# Patient Record
Sex: Male | Born: 1945
Health system: Southern US, Community
[De-identification: ages and names within clinical notes are randomized; demographics above are authoritative.]

## PROBLEM LIST (undated history)

## (undated) DIAGNOSIS — K219 Gastro-esophageal reflux disease without esophagitis: Secondary | ICD-10-CM

## (undated) DIAGNOSIS — D649 Anemia, unspecified: Secondary | ICD-10-CM

## (undated) DIAGNOSIS — T7840XA Allergy, unspecified, initial encounter: Secondary | ICD-10-CM

## (undated) DIAGNOSIS — I2510 Atherosclerotic heart disease of native coronary artery without angina pectoris: Secondary | ICD-10-CM

## (undated) DIAGNOSIS — G43109 Migraine with aura, not intractable, without status migrainosus: Secondary | ICD-10-CM

## (undated) DIAGNOSIS — C801 Malignant (primary) neoplasm, unspecified: Secondary | ICD-10-CM

## (undated) DIAGNOSIS — K759 Inflammatory liver disease, unspecified: Secondary | ICD-10-CM

## (undated) DIAGNOSIS — E039 Hypothyroidism, unspecified: Secondary | ICD-10-CM

## (undated) DIAGNOSIS — E119 Type 2 diabetes mellitus without complications: Secondary | ICD-10-CM

## (undated) DIAGNOSIS — I10 Essential (primary) hypertension: Secondary | ICD-10-CM

## (undated) DIAGNOSIS — H269 Unspecified cataract: Secondary | ICD-10-CM

## (undated) DIAGNOSIS — M1990 Unspecified osteoarthritis, unspecified site: Secondary | ICD-10-CM

## (undated) DIAGNOSIS — G4730 Sleep apnea, unspecified: Secondary | ICD-10-CM

## (undated) DIAGNOSIS — E785 Hyperlipidemia, unspecified: Secondary | ICD-10-CM

## (undated) DIAGNOSIS — Z8669 Personal history of other diseases of the nervous system and sense organs: Secondary | ICD-10-CM

## (undated) HISTORY — DX: Gastro-esophageal reflux disease without esophagitis: K21.9

## (undated) HISTORY — PX: BACK SURGERY: SHX140

## (undated) HISTORY — PX: COLONOSCOPY: SHX174

## (undated) HISTORY — DX: Migraine with aura, not intractable, without status migrainosus: G43.109

## (undated) HISTORY — PX: SURGERY SCROTAL / TESTICULAR: SUR1316

## (undated) HISTORY — DX: Hyperlipidemia, unspecified: E78.5

## (undated) HISTORY — DX: Allergy, unspecified, initial encounter: T78.40XA

## (undated) HISTORY — PX: CARDIAC SURGERY: SHX584

## (undated) HISTORY — DX: Personal history of other diseases of the nervous system and sense organs: Z86.69

## (undated) HISTORY — PX: ELBOW SURGERY: SHX618

## (undated) HISTORY — DX: Essential (primary) hypertension: I10

## (undated) HISTORY — PX: KNEE SURGERY: SHX244

## (undated) HISTORY — PX: CORONARY ARTERY BYPASS GRAFT: SHX141

---

## 1999-02-27 ENCOUNTER — Ambulatory Visit: Admission: RE | Admit: 1999-02-27 | Discharge: 1999-02-27 | Payer: Self-pay | Admitting: Pulmonary Disease

## 1999-07-05 HISTORY — PX: CORONARY ANGIOPLASTY: SHX604

## 1999-08-05 ENCOUNTER — Ambulatory Visit: Admission: RE | Admit: 1999-08-05 | Discharge: 1999-08-05 | Payer: Self-pay | Admitting: Pulmonary Disease

## 1999-10-13 ENCOUNTER — Encounter: Payer: Self-pay | Admitting: Cardiology

## 1999-10-13 ENCOUNTER — Encounter: Admission: RE | Admit: 1999-10-13 | Discharge: 1999-10-13 | Payer: Self-pay | Admitting: Cardiology

## 1999-10-18 ENCOUNTER — Ambulatory Visit (HOSPITAL_COMMUNITY): Admission: RE | Admit: 1999-10-18 | Discharge: 1999-10-19 | Payer: Self-pay | Admitting: Cardiology

## 2000-06-11 ENCOUNTER — Emergency Department (HOSPITAL_COMMUNITY): Admission: EM | Admit: 2000-06-11 | Discharge: 2000-06-11 | Payer: Self-pay | Admitting: Emergency Medicine

## 2000-11-30 ENCOUNTER — Encounter: Payer: Self-pay | Admitting: General Surgery

## 2000-11-30 ENCOUNTER — Ambulatory Visit (HOSPITAL_COMMUNITY): Admission: RE | Admit: 2000-11-30 | Discharge: 2000-11-30 | Payer: Self-pay | Admitting: General Surgery

## 2000-12-12 ENCOUNTER — Encounter: Payer: Self-pay | Admitting: Gastroenterology

## 2000-12-12 ENCOUNTER — Encounter: Admission: RE | Admit: 2000-12-12 | Discharge: 2000-12-12 | Payer: Self-pay | Admitting: Gastroenterology

## 2002-05-17 ENCOUNTER — Encounter: Payer: Self-pay | Admitting: Family Medicine

## 2002-05-17 ENCOUNTER — Encounter: Admission: RE | Admit: 2002-05-17 | Discharge: 2002-05-17 | Payer: Self-pay | Admitting: Family Medicine

## 2002-12-02 ENCOUNTER — Emergency Department (HOSPITAL_COMMUNITY): Admission: EM | Admit: 2002-12-02 | Discharge: 2002-12-03 | Payer: Self-pay | Admitting: Emergency Medicine

## 2002-12-20 ENCOUNTER — Encounter: Payer: Self-pay | Admitting: Family Medicine

## 2002-12-20 ENCOUNTER — Encounter: Admission: RE | Admit: 2002-12-20 | Discharge: 2002-12-20 | Payer: Self-pay | Admitting: Family Medicine

## 2002-12-22 ENCOUNTER — Encounter: Admission: RE | Admit: 2002-12-22 | Discharge: 2002-12-22 | Payer: Self-pay | Admitting: Family Medicine

## 2002-12-22 ENCOUNTER — Encounter: Payer: Self-pay | Admitting: Family Medicine

## 2003-01-08 ENCOUNTER — Encounter: Payer: Self-pay | Admitting: Diagnostic Radiology

## 2003-01-08 ENCOUNTER — Encounter: Payer: Self-pay | Admitting: Neurological Surgery

## 2003-01-08 ENCOUNTER — Encounter: Admission: RE | Admit: 2003-01-08 | Discharge: 2003-01-08 | Payer: Self-pay | Admitting: Neurological Surgery

## 2003-03-03 ENCOUNTER — Ambulatory Visit (HOSPITAL_COMMUNITY): Admission: RE | Admit: 2003-03-03 | Discharge: 2003-03-03 | Payer: Self-pay | Admitting: Neurological Surgery

## 2003-03-03 ENCOUNTER — Encounter: Payer: Self-pay | Admitting: Neurological Surgery

## 2003-09-03 ENCOUNTER — Ambulatory Visit (HOSPITAL_COMMUNITY): Admission: RE | Admit: 2003-09-03 | Discharge: 2003-09-03 | Payer: Self-pay | Admitting: Internal Medicine

## 2003-09-22 ENCOUNTER — Encounter: Admission: RE | Admit: 2003-09-22 | Discharge: 2003-09-22 | Payer: Self-pay | Admitting: Family Medicine

## 2003-10-21 ENCOUNTER — Ambulatory Visit (HOSPITAL_COMMUNITY): Admission: RE | Admit: 2003-10-21 | Discharge: 2003-10-21 | Payer: Self-pay | Admitting: Gastroenterology

## 2007-02-27 ENCOUNTER — Encounter: Admission: RE | Admit: 2007-02-27 | Discharge: 2007-02-27 | Payer: Self-pay | Admitting: Family Medicine

## 2007-05-16 ENCOUNTER — Ambulatory Visit (HOSPITAL_COMMUNITY): Admission: RE | Admit: 2007-05-16 | Discharge: 2007-05-16 | Payer: Self-pay | Admitting: Cardiology

## 2007-05-24 ENCOUNTER — Ambulatory Visit: Payer: Self-pay | Admitting: Cardiothoracic Surgery

## 2007-05-25 ENCOUNTER — Ambulatory Visit (HOSPITAL_COMMUNITY): Admission: RE | Admit: 2007-05-25 | Discharge: 2007-05-25 | Payer: Self-pay | Admitting: Cardiothoracic Surgery

## 2007-05-25 ENCOUNTER — Encounter: Payer: Self-pay | Admitting: Cardiothoracic Surgery

## 2007-05-28 ENCOUNTER — Inpatient Hospital Stay (HOSPITAL_COMMUNITY): Admission: RE | Admit: 2007-05-28 | Discharge: 2007-06-02 | Payer: Self-pay | Admitting: Cardiothoracic Surgery

## 2007-05-29 ENCOUNTER — Ambulatory Visit: Payer: Self-pay | Admitting: Cardiothoracic Surgery

## 2007-06-21 ENCOUNTER — Ambulatory Visit: Payer: Self-pay | Admitting: Cardiothoracic Surgery

## 2007-06-21 ENCOUNTER — Encounter: Admission: RE | Admit: 2007-06-21 | Discharge: 2007-06-21 | Payer: Self-pay | Admitting: Cardiothoracic Surgery

## 2007-06-26 ENCOUNTER — Encounter (HOSPITAL_COMMUNITY): Admission: RE | Admit: 2007-06-26 | Discharge: 2007-07-04 | Payer: Self-pay | Admitting: Cardiology

## 2007-07-09 ENCOUNTER — Encounter (HOSPITAL_COMMUNITY): Admission: RE | Admit: 2007-07-09 | Discharge: 2007-10-07 | Payer: Self-pay | Admitting: Cardiology

## 2009-04-09 ENCOUNTER — Ambulatory Visit (HOSPITAL_COMMUNITY): Admission: RE | Admit: 2009-04-09 | Discharge: 2009-04-09 | Payer: Self-pay | Admitting: Internal Medicine

## 2009-04-20 IMAGING — CR DG CHEST 2V
2 series · 2 of 2 positions shown · non-contrast
Comparison: 09/22/03.

CLINICAL DATA: Pre-cardiac catheterization.  
 CHEST - 2 VIEW:

[view not recorded (1 of 2)]
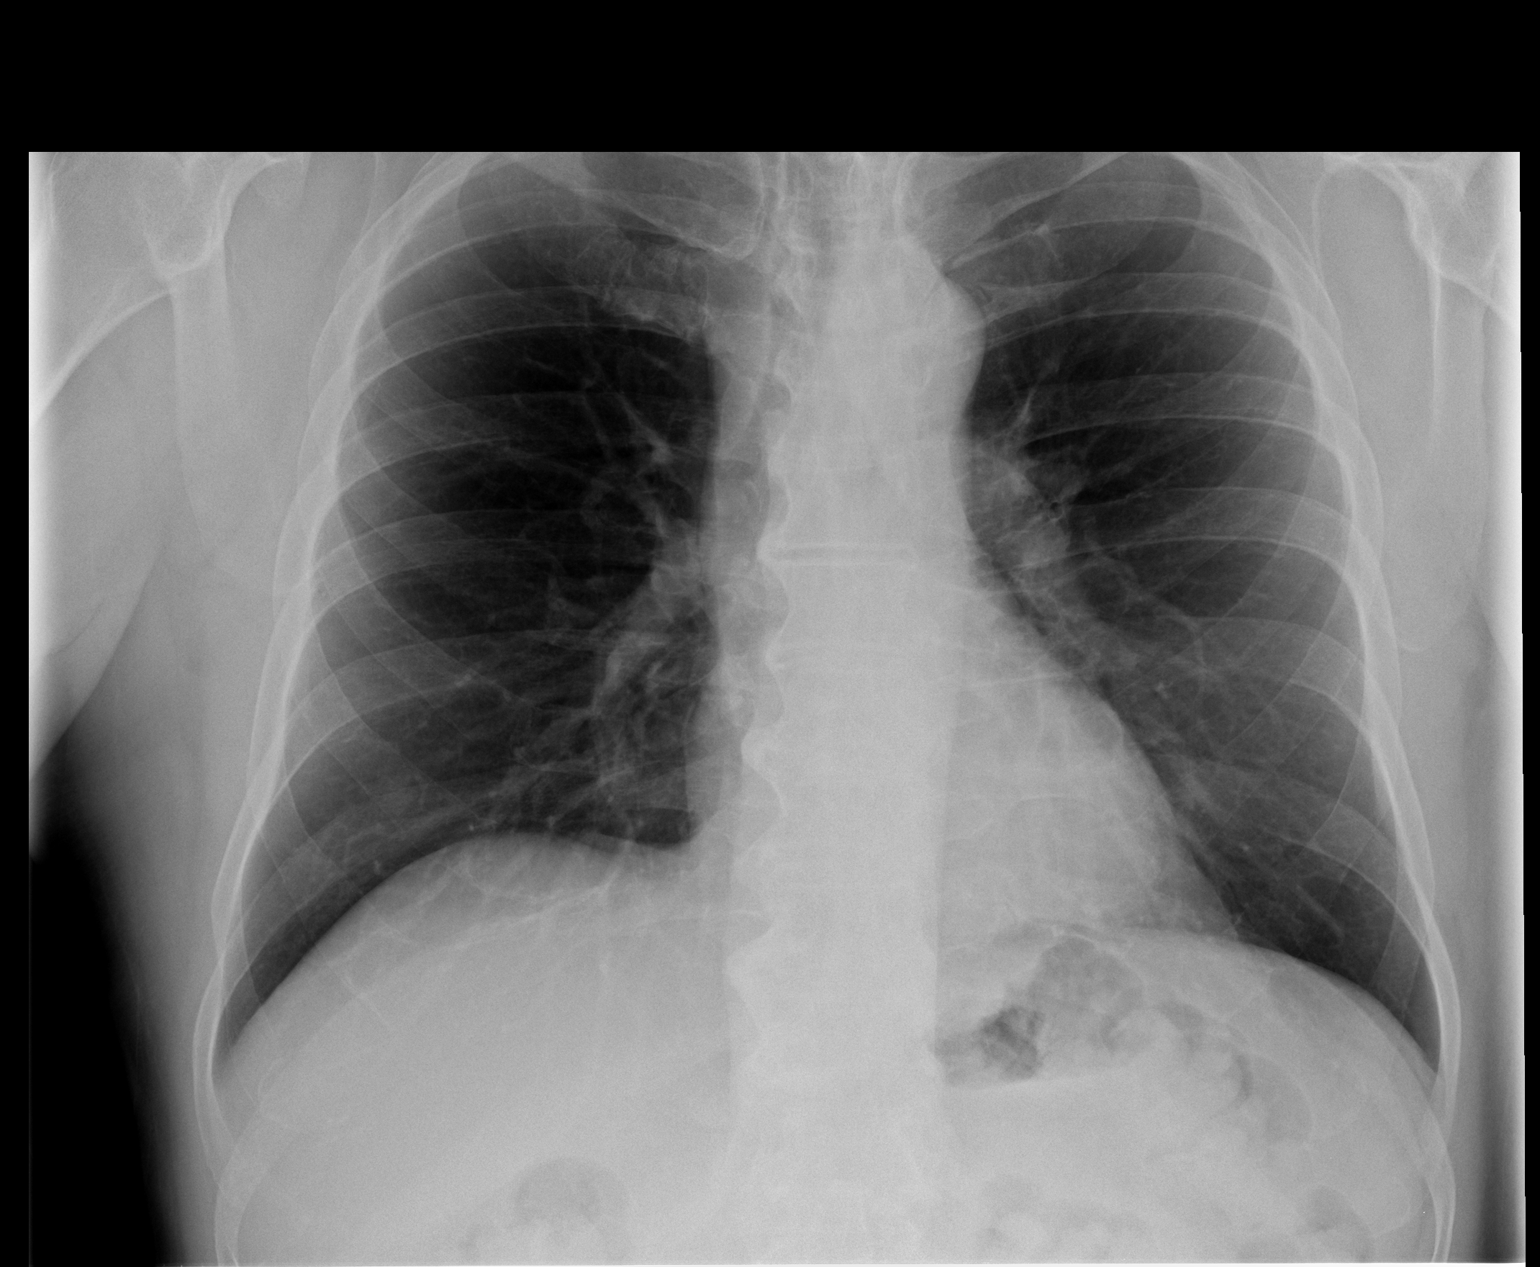

[view not recorded (2 of 2)]
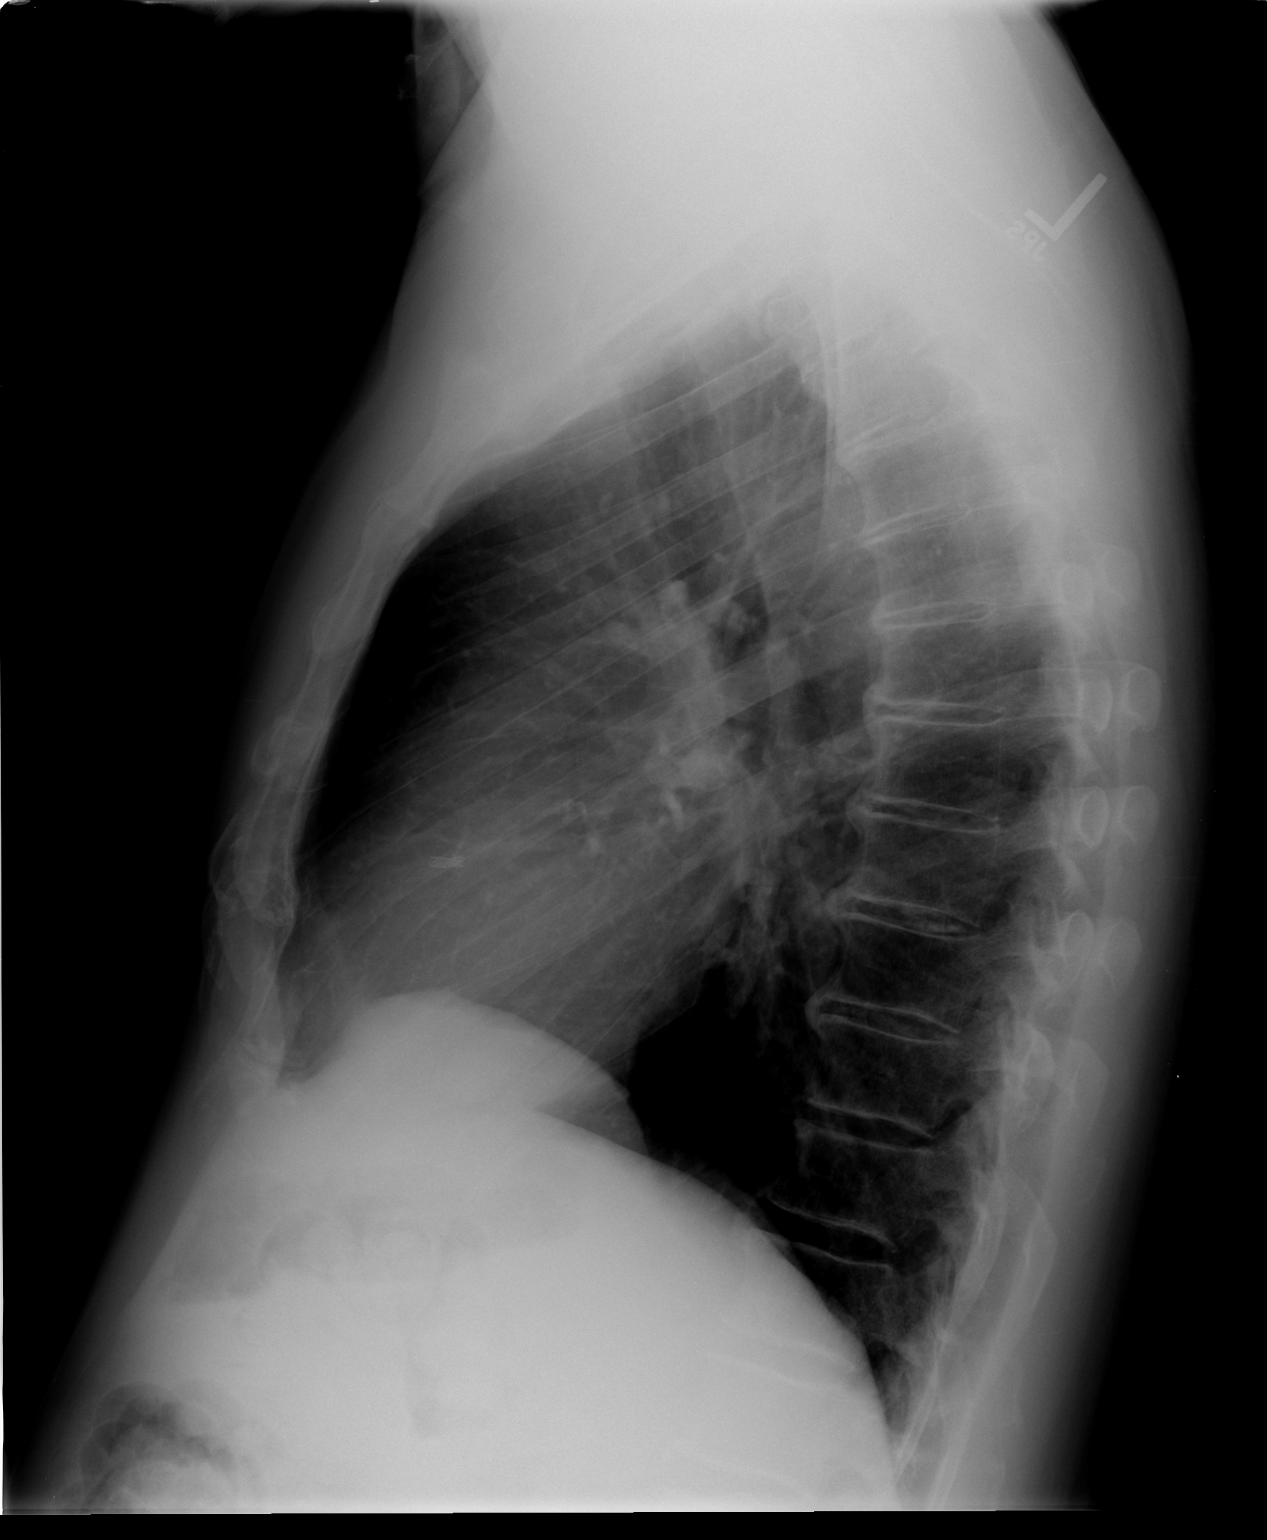

[2 of 2 positions shown; findings below may reference images not displayed]

FINDINGS: The cardiomediastinal contours are stable.  Coronary artery stent is noted.  The lungs are clear.  There is no pleural effusion.  Thoracic spine osteophytes are noted.
IMPRESSION: Stable examination.  No active cardiopulmonary process.

## 2009-05-05 IMAGING — CR DG CHEST 2V
2 series · 2 of 2 positions shown · non-contrast
Comparison: 05/30/07.

CLINICAL DATA: Coronary artery disease, status post CABG. 
 CHEST - 2 VIEW:

[w chest pa]
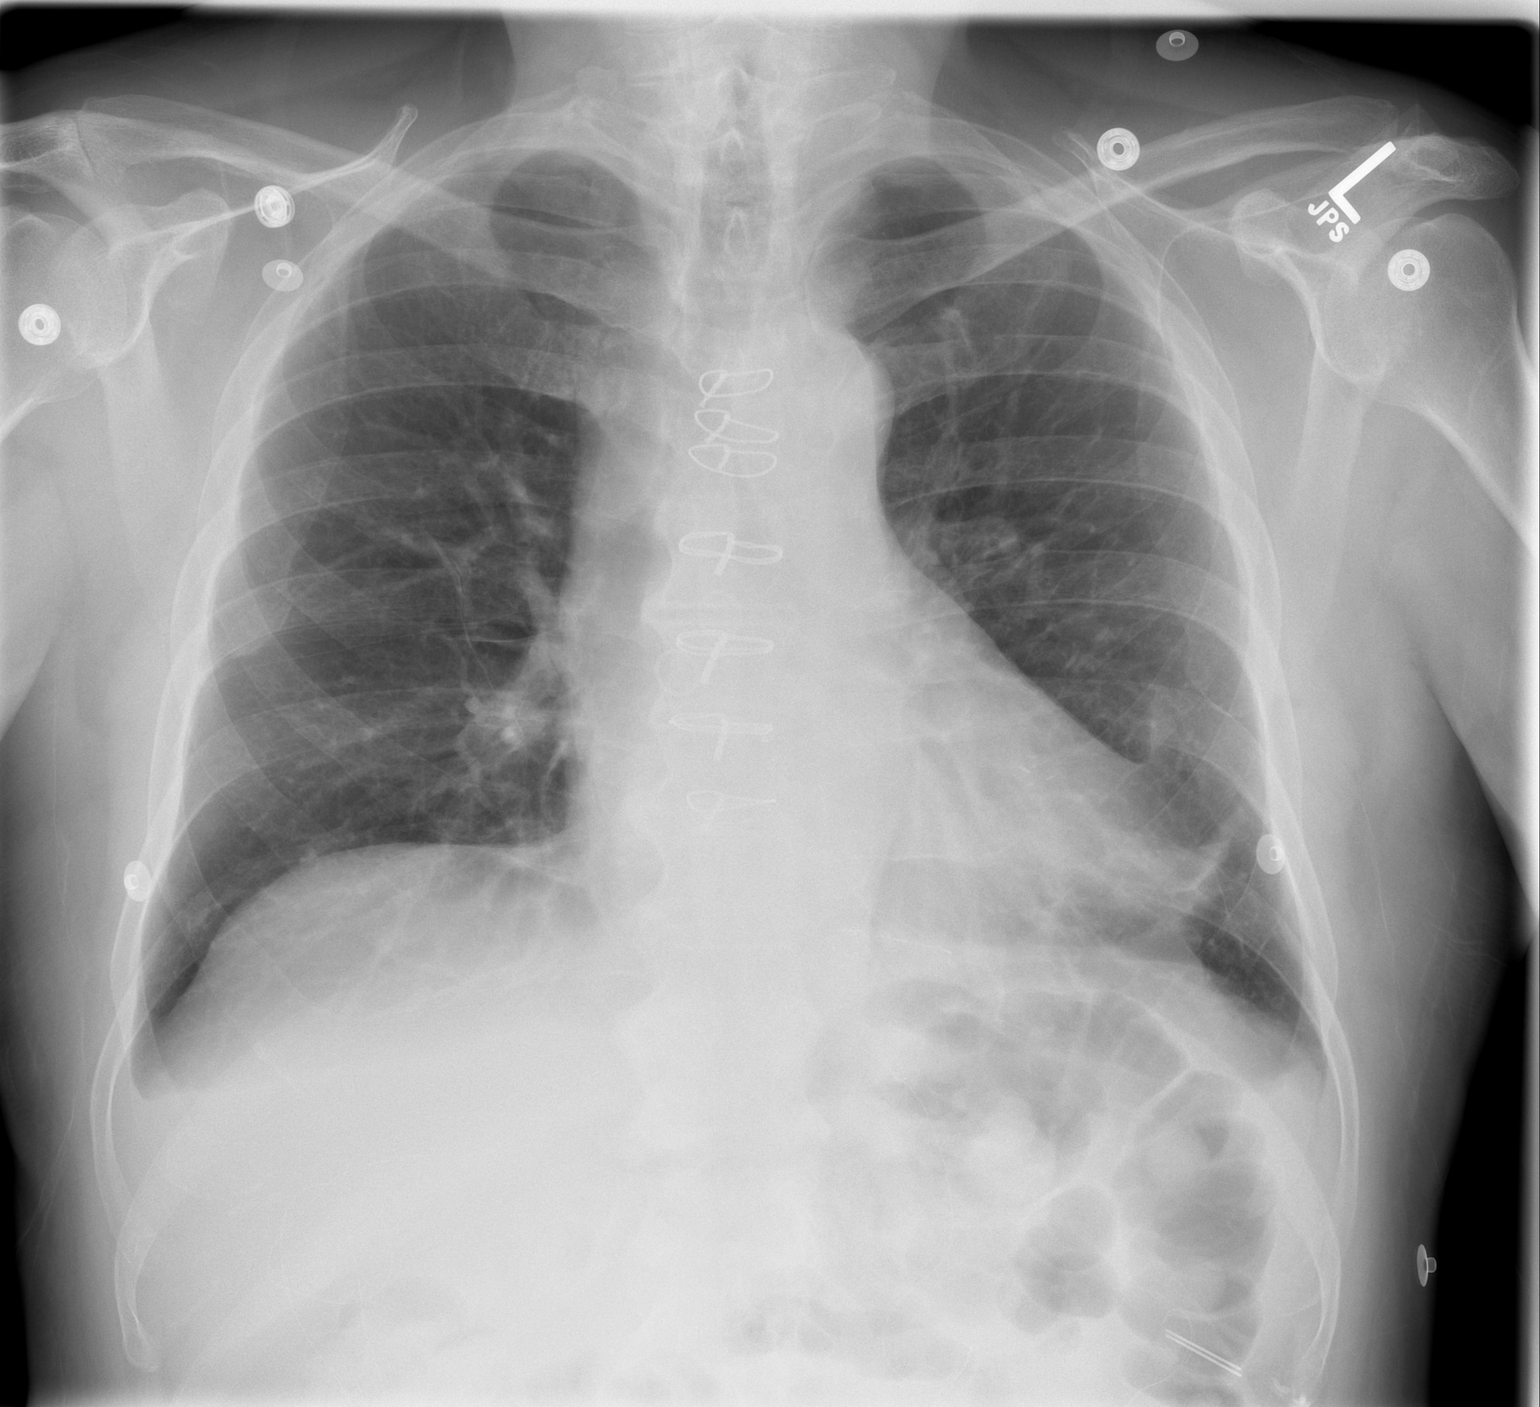

[w chest lat]
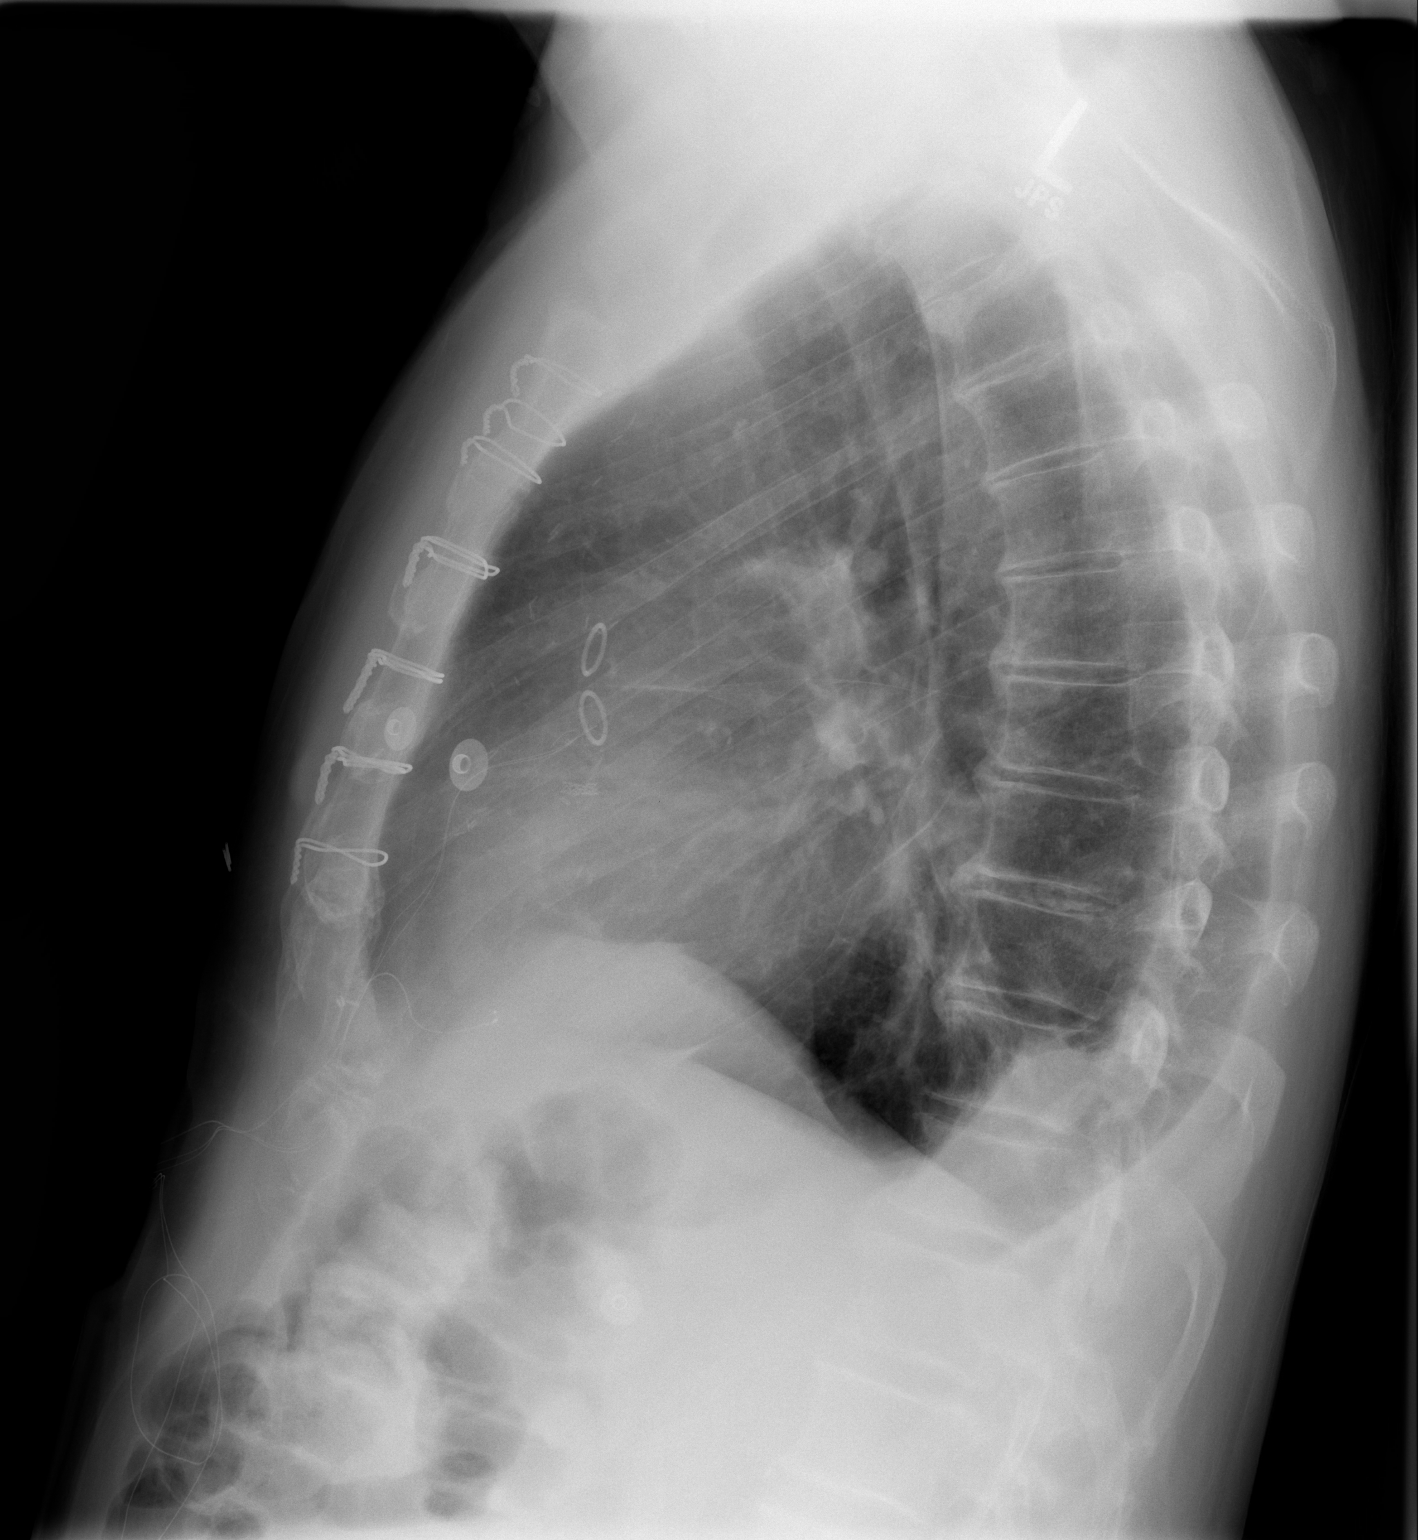

[2 of 2 positions shown; findings below may reference images not displayed]

FINDINGS: Patient is status post CABG.  Mild cardiomegaly, no edema.  Lung volumes slightly increased with improved left lower lobe atelectasis after chest tube removal.  Trace effusions noted.
IMPRESSION: 1.  Improved aeration and decreased left lower lobe atelectasis. 
 2.  Trace bilateral effusions.

## 2010-02-08 ENCOUNTER — Ambulatory Visit: Payer: Self-pay | Admitting: Vascular Surgery

## 2010-11-16 NOTE — Assessment & Plan Note (Signed)
OFFICE VISIT   CISCO, KINDT  DOB:  [birth year]                                        June 21, 2007  CHART #:  [id number]   Joshua Higgins returns today for a followup visit after his coronary artery  bypass grafting, done on November 24.  At that time, he had coronary  artery bypass grafting times four.  He has made excellent progress  postoperatively.  He has had no angina or evidence of congestive heart  failure.   ON EXAM:  His blood pressure is 125/74, pulse is 83 and regular,  respiratory rate is 18, O2 sat is 99%.  His sternum was stable and well-healed.  LUNGS:  Clear bilaterally.  He has slight bruising around the right vein harvest site.  The incision  sites in the leg are well-healed.   Followup chest x-ray shows clear lung fields bilaterally, without  evidence of effusions.   Overall, he has made very good progress since surgery.  I released him  to return to driving.  He is to do no heavy lifting, over 20-25 pounds,  for three months.  His job involves mostly desk work.  We discussed  returning to work in early January, a half day for two weeks and, if  that goes well, then return to full-time after that.  He notes that he  is to see Dr. Elsie Lincoln in early May with a followup stress test.  I have  not made him a return appointment to see me, but would be glad to see  him at any time at Dr. Truett Perna request.   Joshua Plane, MD  Electronically Signed   EG/MEDQ  D:  06/21/2007  T:  06/22/2007  Job:  [id number]   cc:   Madaline Savage, M.D.  Jeoffrey Massed, MD

## 2010-11-16 NOTE — H&P (Signed)
NAMEMarland Higgins  DEANTE, BLOUGH NO.:  [id number]   MEDICAL RECORD NO.:  [id number]           PATIENT TYPE:   LOCATION:                                 FACILITY:   PHYSICIAN:  Sheliah Plane, MD    DATE OF BIRTH:  Nov 02, 1945   DATE OF ADMISSION:  DATE OF DISCHARGE:                              HISTORY & PHYSICAL   This is an office consultation and history and physical.   REQUESTING PHYSICIAN:  Madaline Savage, M.D.   FOLLOWUP CARDIOLOGIST:  Madaline Savage, M.D.   PRIMARY CARE PHYSICIAN:  McGowen, M.D.   REASON FOR CONSULTATION:  Coronary artery disease.   HISTORY OF PRESENT ILLNESS:  The patient is a 65 year old male who as  far as I can determine is asymptomatic from his known coronary occlusive  disease. The only symptom I can elicit from him is that during the  summer he was chopping wood in 95-degree heat, and his family thinks he  may have passed out. He notes that he got tired and laid down. He has  had no definite angina in spite of remaining active. Denies shortness of  breath. He has known coronary occlusive disease, having been  catheterized in 2001 after a positive stress test and had disease in the  right coronary artery. A right coronary artery stent was placed. The  patient recently had an injury to the left knee, called to see about  stopping his aspirin, and since he had not been evaluated for some time,  he underwent his left knee arthroscopic surgery but then followed up  with Dr. Elsie Lincoln with a stress test that showed inferior ischemia, normal  ejection fraction. Cardiac catheterization was performed, and the  patient is referred for consideration of bypass surgery.   CARDIAC RISK FACTORS:  The patient denies hypertension, but he is on  Diovan. Denies hyperlipidemia but does take Lipitor. Has type 2 diabetes  with a recent diagnosis; hemoglobin A1c has been closely followed. The  patient notes the last one was 6.2. He is a remote smoker,  quitting more  than 35 years ago. He has a very positive family history of cardiac  disease. Father died at age 39 of myocardial infarction. Mother died at  age 8. He has 6 brothers; 3 have had bypass surgery; 1 died at age 35  while having bypass surgery in the late 28s. He has 1 sister at age 49  who had a myocardial infarction and subsequently died of complications  of cardiac catheterization including renal failure and peripheral  embolization. One brother has had abdominal aortic aneurysm repair. The  patient denies any previous stroke, denies claudication, denies renal  insufficiency.   PREVIOUS MEDICAL PROBLEMS:  The patient had a scanning abdominal  echocardiogram done by [facility] in August of 2006 suggesting a  saccular abdominal aortic aneurysm measuring 3.6 x 3.6. Follow up on the  scan was done by Dr. Arbie Cookey in September of 2006. The distal abdominal  aorta measured 2.4 x 2.3 cm.   PAST SURGICAL HISTORY:  Includes:  1. Arthroscopy, left knee.  2. Lumbar laminectomy.  3. Right inguinal hernia repair complicated by neurapraxia in the left      elbow. Transposition of nerve was performed when the patient was      16. He has had no problems since.   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 Denies alcohol use.   CURRENT MEDICATIONS:  Include:  1. Atenolol 25 mg p.o. once a day which was originally started for      migraine headaches.  2. Diovan 40 mg a day.  3. Lipitor 40 mg a day.  4. Aspirin 325 mg a day.  5. Metformin HCl ER one 500-mg tablet a day.  6. Acyclovir 200 mg as needed for fever blisters.  7. He is also on vitamin D 4000 units per day.  8. The patient takes occasional ibuprofen but none for the past week.   ALLERGIES:  None known.   CARDIAC REVIEW OF SYSTEMS:  Is negative for chest pain, resting  shortness of breath, exertional shortness of breath, orthopnea, syncope  or lower extremity edema. He  notes that very rarely he will notice a  skipped beat of his heart and the question of a brief syncopal episode  in the summer in the heat while chopping wood.   GENERAL REVIEW OF SYSTEMS:  The patient's weight has been stable  recently. Approximately three years ago when diagnosed with diabetes, he  weighed 212 and now has decreased his weight to 175 and maintained.  Denies fevers, chills or night sweats. Denies change in vision. Denies  hearing loss. Denies syncope or congestive heart failure. Denies  wheezing, hemoptysis, orthopnea. Denies abdominal pain. Has had no  hematochezia or melena. Denies kidney stones, hematuria or dysuria.  Denies any joint discomfort other than the left knee where he recently  had arthroscopy. Has a very distant history of migraine headaches but  none for years. Denies psychiatric history. Other review of systems is  negative.   PHYSICAL EXAMINATION:  Blood pressure 123/68, pulse 76, respiratory rate  18, O2 saturation 97%. The patient is 5 feet 10 inches tall, 175 pounds.  The patient is awake, alert, neurologically intact.  Pupils are equal, round, and reactive to light.  NECK:  Is without carotid bruits.  LUNGS:  Are clear bilaterally.  CARDIAC EXAM:  Reveals a regular rate and rhythm without murmur or  gallop.  ABDOMINAL EXAM:  Is benign. The abdominal aorta is palpable but does not  feel enlarged. Right groin site is without bruising. The patient appears  to have adequate veins in both lower extremities, plus 2 DP and PT  pulses bilaterally.   LABORATORY FINDINGS:  Are pending.   Catheterization films are reviewed as done by Dr. Elsie Lincoln on May 16, 2007. The right coronary artery is totally occluded. Really, there is no  collateral filling noted. The origin of the stent is at the ostium of  the right in the proximal circumflex which is a dominant system. There  is a 75% stenosis in the proximal third of the LAD, near the diagonal,  there is  80% stenosis.   The patient is noted to have significant three-vessel coronary artery  disease with silent ischemia and abnormal stress test. I agree with Dr.  Elsie Lincoln to proceed with coronary artery bypass grafting. The risks of  surgery including death,  infection, stroke, myocardial infarction, bleeding, blood transfusion  are all discussed with the patient and his family in detail. Their  questions have been  answered, and he is willing to proceed. We will  tentatively plan for November 24.      Sheliah Plane, MD  Electronically Signed     EG/MEDQ  D:  05/24/2007  T:  05/24/2007  Job:  [id number]   cc:   Madaline Savage, M.D.  [name], M.D.

## 2010-11-16 NOTE — Discharge Summary (Signed)
Joshua Higgins, MINDER NO.:  [id number]   MEDICAL RECORD NO.:  [id number]          PATIENT TYPE:  INP   LOCATION:  2035                         FACILITY:  [facility]   PHYSICIAN:  Sheliah Plane, MD    DATE OF BIRTH:  [birth year]   DATE OF ADMISSION:  05/28/2007  DATE OF DISCHARGE:                               DISCHARGE SUMMARY   DATE OF ANTICIPATED DISCHARGE:  June 02, 2007   PRIMARY ADMITTING DIAGNOSIS:  Coronary artery disease.   ADDITIONAL/DISCHARGE DIAGNOSES:  1. Coronary artery disease.  2. Hypertension.  3. Hyperlipidemia.  4. Type 2 diabetes mellitus.  5. Remote history of tobacco abuse.  6. Status post left knee arthroscopy.  7. Postoperative UTI.  8. Postoperative acute blood loss anemia.   PROCEDURES PERFORMED:  Coronary artery bypass grafting times 4 (left  internal mammary artery to the LAD, sequential saphenous vein graft to  the intermediate and the obtuse marginal, saphenous vein graft to the  acute marginal).  1. Endoscopic vein harvest right leg.   HISTORY:  The patient is a 65 year old male with a known history of  coronary artery disease status post right coronary artery stent  placement in 2001.  He recently underwent left knee arthroscopy and was  advised to follow up with Dr. Elsie Lincoln for perioperative stress test.  This showed inferior ischemia with a normal ejection fraction.  He has  been asymptomatic from his disease.  It was recommended that he undergo  cardiac catheterization.  This was performed on May 16, 2007 and he  was found to have a totally occluded right coronary artery with a 75%  stenosis in the proximal third of the LAD, and an 80% stenosis of the  diagonal.  He was subsequently referred to Dr. Sheliah Plane for  consideration of surgical revascularization.  Dr. Tyrone Sage saw the  patient and reviewed his films, and felt that his best course of action  would be to proceed with CABG at this time.  He explained  the risks,  benefits, and alternatives to the patient, and he agreed to proceed with  surgery.   HOSPITAL COURSE:  He was admitted to [facility] on May 28, 2007 and underwent CABG times 4 as described in detail above.  He  tolerated the procedure well and was transferred to the SICU in stable  condition.  He was able to be extubated shortly after surgery.  He was  hemodynamically stable and doing well on postop day #1.  He was found to  have an acute blood loss anemia and required a transfusion of a unit of  packed red blood cells.  He remained in the unit for further  observation.  By postop day #2 his chest tubes and hemodynamic  monitoring devices had been removed and he was able to be transferred to  the floor.  His only postoperative complication was a fever with  leukocytosis.  A urinalysis was positive and he was started on Cipro for  urinary tract infection.  He has remained afebrile since that time and  his white blood cell count has trended downward.  He is otherwise doing  well.  He has been started back on his home diabetes medications and his  blood sugars have remained relatively stable.  His incisions are all  healing well.  He has been started on an iron supplement for his anemia.  He is maintaining normal sinus rhythm.  He has been afebrile and his  vital signs have been stable during his admission.  He is ambulating in  the halls both independently and with cardiac rehab phase one.  His most  recent labs showed hemoglobin 8.5, hematocrit 24.3, platelets 122, white  count 4.4, BUN 11, creatinine 1.02.  It is felt that if he continues to  remain stable over the next 24 hours he will hopefully be ready for  discharge home on June 02, 2007.   DISCHARGE MEDICATIONS:  Discharge medications are as follows.  1. Enteric-coated aspirin 325 mg daily.  2. Tylox 1-2 q.4 h p.r.n. for pain.  3. Atenolol 25 mg daily.  4. Lipitor 40 mg q.h.s.  5. Metformin 500 mg  q.h.s.  6. Vitamin D 4000 IU daily.  7. Cipro 500 mg b.i.d. to complete a 3-day course.  8. Niferex 150 mg daily.  9. Lasix 40 mg daily times 3 days.  10.K-Dur 20 mEq daily times 3 days.   DISCHARGE INSTRUCTIONS:  He is asked to refrain from driving, heavy  lifting, or strenuous activity.  He may continue ambulating daily and  using his incentive spirometer.  He may shower daily and clean his  incisions with soap and water.  He will continue a low-fat, low-sodium  diet.   DISCHARGE FOLLOWUP:  He will see Dr. Tyrone Sage back in the office in 3  weeks with a chest x-ray.  He is asked to make an appointment with Dr.  Elsie Lincoln in 2 weeks.  In the interim if he experiences any problems or has  questions he is asked to contact our office immediately.      Coral Ceo, P.A.      Sheliah Plane, MD  Electronically Signed    GC/MEDQ  D:  06/01/2007  T:  06/01/2007  Job:  [id number]   cc:   Madaline Savage, M.D.  Jeoffrey Massed, MD

## 2010-11-16 NOTE — Cardiovascular Report (Signed)
NAMEJUSTUS, Joshua Higgins             ACCOUNT NO.:  [id number]   MEDICAL RECORD NO.:  [id number]          PATIENT TYPE:  OIB   LOCATION:  2853                         FACILITY:  [facility]   PHYSICIAN:  Madaline Savage, M.D.DATE OF BIRTH:  1946/03/14   DATE OF PROCEDURE:  05/16/2007  DATE OF DISCHARGE:                            CARDIAC CATHETERIZATION   PROCEDURES PERFORMED:  1. Selective coronary angiography by Judkins technique.  2. Retrograde left heart catheterization.  3. Left ventricular angiography.   COMPLICATIONS:  None.   ENTRY SITE:  Right femoral.   DYE USED:  Omnipaque.   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  He recently had surveillance stress testing performed, and it showed  evidence of inferior wall ischemia.  Today's outpatient cardiac  catheterization is performed to evaluate his previously stented RCA in  view of the inferior ischemia and also to look at the remainder of his  anatomy considering the fact that his brother has three-vessel disease.  Today's procedure was performed electively as an outpatient procedure  with no complications.   Following catheterization, a StarClose closure device was placed so that  the patient could ambulate soon and be discharged today as an outpatient  and see the cardiac surgical group regarding bypass surgery based on  results described below.   RESULTS:  Left main coronary artery, normal.  Left anterior descending  coronary artery gave rise to one diagonal branch.  75-85% stenosis at  the bifurcation point of the diagonal and LAD with and unfavorable  location for consideration of stenting.   Left circumflex coronary artery, large and dominant vessel, giving rise  to a bifurcating proximal obtuse marginal branch,  a second smaller  obtuse marginal  branch, and a large dominant posterior descending branch  coming off of the distal circumflex.   The right coronary artery was never visualized.  There was a radio-  opaque stent at the ostium of the vessel that showed no antegrade flow  into the native RCA at all.  There was also no retrograde collateral  flow.  This vessel was known to be a small vessel 8 years ago.   Left ventricular angiogram shows normal contractility with an ejection  fraction of 60% and no evidence of mitral regurgitation.   Angiography of the left internal mammary artery shows that it is widely  patent.   The patient's right internal mammary artery is likewise normal as is the  subclavian.   FINAL IMPRESSIONS:  1. Three-vessel coronary disease with 100% occlusion of the previously      placed stent in the ostium of the RCA.  2. Left anterior descending with 75-85% stenosed midportion of the      vessel just before bifurcation of left anterior descending and      diagonal.  75% ostial stenosis of large dominant circumflex      coronary artery.  3. Patent left and right internal mammary arteries.  4. Normal left ventricular systolic function, ejection fraction 60%.   PLAN:  The patient should be continued on medical therapy of his  coronary disease, including aspirin, beta-blocker, ACE inhibitor, and  statin.  I am going to go ahead and get a surgical consult on this  patient, as I am worried about his lack of symptomatology with known  three-vessel disease and lack of angina as a warning symptom for him.           ______________________________  Madaline Savage, M.D.     WHG/MEDQ  D:  05/16/2007  T:  05/17/2007  Job:  [id number]   cc:   [name], M.D.  [facility]

## 2010-11-16 NOTE — Procedures (Signed)
DUPLEX ULTRASOUND OF ABDOMINAL AORTA   INDICATION:  Follow up abdominal aortic aneurysm.   HISTORY:  Diabetes:  Yes.  Cardiac:  Stent in 2001.  Hypertension:  Yes.  Smoking:  Previous.  Connective Tissue Disorder:  Family History:  No.  Previous Surgery:  No.   DUPLEX EXAM:         AP (cm)                   TRANSVERSE (cm)  Proximal             2.05 cm                   2.42 cm  Mid                  2.61 cm                   2.54 cm  Distal               2.08 cm                   2.14 cm  Right Iliac          1.08 cm                   1.00 cm  Left Iliac           1.14 cm                   1.25 cm   PREVIOUS:  Date:  AP:  2.4  TRANSVERSE:  2.3   IMPRESSION:  1. Abdominal aortic aneurysm noted with the largest measurement of      2.61 cm X 2.54 cm.  2. No elevated velocities noted in the aorta and common iliac      arteries.  3. No significant change from previous studies.   ___________________________________________  Janetta Hora Fields, MD   CB/MEDQ  D:  02/08/2010  T:  02/08/2010  Job:  [id number]

## 2010-11-16 NOTE — Op Note (Signed)
Joshua Higgins, Joshua Higgins NO.:  [id number]   MEDICAL RECORD NO.:  [id number]          PATIENT TYPE:  INP   LOCATION:  2035                         FACILITY:  [facility]   PHYSICIAN:  Sheliah Plane, MD    DATE OF BIRTH:  04-13-1946   DATE OF PROCEDURE:  05/28/2007  DATE OF DISCHARGE:  06/02/2007                               OPERATIVE REPORT   PREOPERATIVE DIAGNOSIS:  Coronary occlusive disease.   POSTOPERATIVE DIAGNOSIS:  Coronary occlusive disease.   SURGICAL PROCEDURE:  Coronary artery bypass grafting x4 with left  internal mammary artery to the left anterior descending coronary artery,  sequential reversed saphenous vein graft to the diagonal and circumflex  coronary artery, reversed saphenous vein graft to the right coronary  artery with right leg endovein harvesting.   SURGEON:  Sheliah Plane, M.D.   FIRST ASSISTANT:  Salvatore Decent. Cornelius Moras, M.D.   SECOND ASSISTANT:  Rowe Clack, P.A.-C.   BRIEF HISTORY:  The patient is a 65 year old male who several years  previously had undergone cardiac catheterization and was found to have a  critical lesion in the right coronary artery.  A right coronary artery  stent was placed.  The patient had done well symptomatically over the  past several years, but recently had a cardiac stress test that was  positive for inferior ischemia.  A cardiac catheterization was performed  which demonstrated occlusion of the right coronary artery and  progression of disease with 70% to 80% stenosis involving the LAD and  diagonal at the ostium and at the takeoff of the diagonal, in addition,  circumflex disease of 70% to 80% and total occlusion of the right  coronary artery proximally at the stent.  Overall ventricular function  was preserved.  Because of the patient's progression of disease and  critical three-vessel disease, coronary artery bypass grafting was  recommended; the patient agreed and signed informed consent.   DESCRIPTION  OF PROCEDURE:  With Swan-Ganz and arterial line monitors  placed, the patient underwent general endotracheal anesthesia without  incident.  The skin of the chest and legs was prepped with Betadine and  draped in the usual sterile manner.  Using the Guidant endovein  harvesting system, vein was harvested from the right thigh and was of  good quality and caliber.  A median sternotomy was performed.  The left  internal mammary artery was dissected down as a pedicle graft.  The  distal artery was divided and had good free flow.  The pericardium was  opened.  Overall ventricular function appeared preserved.  There was a  small nondominant right system, but with a reasonable-size acute  marginal branch that was the primary distal vessel on the right.  An  aortic cross-clamp was applied; 500 mL of cold blood and potassium  cardioplegia were administered with rapid diastolic arrest of the heart.  Attention was turned first to the right system.  The vessel was opened  and admitted a 1.5-mm probe.  Using a running 7-0 Prolene, distal  anastomosis was performed.  The heart was then elevated and the first  diagonal was identified and opened  and admitted a 1.5-mm probe.  Using a  diamond type, side-to-side anastomosis was carried with a segment of  reversed saphenous vein graft.  The distal extent of the same vein was  then carried to the more distal circumflex branch, which was slightly  smaller, but admitted a 1.5-mm probe.  Using a running 7-0 Prolene,  distal anastomosis was performed.  Attention was then turned to the left  anterior descending coronary artery, which was opened between the mid  and distal third.  Using a running 8-0 Prolene, the left internal  mammary artery was anastomosed to the left anterior descending coronary  artery.  With the cross-clamp still in place, each of the 2 vein grafts  were anastomosed to the ascending aorta.  Air was evacuated from the  grafts and the ascending  aorta and the aortic cross-clamp was removed  with a total cross-clamp time of 79 minutes.  The patient spontaneously  converted to a sinus rhythm.  Sites of anastomosis were inspected and  were free of bleeding.  The patient was then rewarmed to 37 degrees and  vented.  He was then ventilated and weaned from cardiopulmonary bypass  without difficulty and remained hemodynamically stable.  He was  decannulated in the usual fashion.  Protamine sulfate was administered.  With the operative field hemostatic, 2 atrial and ventricular pacing  wires were applied.  Graft markers were applied.  A left pleural tube  and Blake mediastinal drain were left in place.  Sternum was closed with  #6 stainless steel wire, fascia closed with interrupted 0 Vicryl,  running 3-0 Vicryl in the subcutaneous tissue and 4-0 subcuticular  stitch in the skin edges.  Dry dressings were applied.  Sponge and  needle count was reported as correct at completion of the procedure.  The patient tolerated the procedure without obvious complication and was  transferred to the surgical intensive care unit for further  postoperative care.      Sheliah Plane, MD  Electronically Signed     EG/MEDQ  D:  06/04/2007  T:  06/04/2007  Job:  [id number]   cc:   Madaline Savage, M.D.

## 2010-11-19 NOTE — Discharge Summary (Signed)
[place]. [facility]  Patient:    Joshua Higgins                    MRN: [id number] Adm. Date:  [id number] Disc. Date: [id number] Attending:  Ophelia Shoulder Dictator:   Halford Decamp Delanna Ahmadi, R.N., N.P. CC:         Marinus Maw, M.D.                           Discharge Summary  HISTORY OF PRESENT ILLNESS:  [name] is a 65 year old white married male patient of Dr. Michaelle Birks who was referred to [place] secondary to having a strong family history of coronary artery disease.  He underwent treadmill testing in our office, which was positive.  He was also having some recent chest discomfort.  Thus, he was scheduled for a heart catheterization. This was done on October 18, 1999 by Dr. Chanda Busing revealing a left main LAD and left circumflex was okay.  RCA had a 75% hypodense ostial stenosis. He underwent PTCA and stenting, reduced to 0%.  The following day, he was seen by Dr. Chanda Busing.  His right groin was mildly bruised.  He had no hematoma.  He had a 2+ right dorsalis pedis pulse.  His lungs were clear.  He was in sinus rhythm.  His blood pressure was 120/64.  His temperature was 99.9.  He was considered stable to be discharged home.  LABORATORY DATA:  Hemoglobin 13.1, hematocrit 37.0, WBC 5.4, platelets 163. On October 19, 1999, his hemoglobin was 13.3, hematocrit 37.1, platelets 174, WBC 5.9.  Sodium was 140, potassium 4.1, BUN 9, creatinine 0.9.  CK-MBs were negative and troponin was negative on admission.  DISCHARGE MEDICATIONS: 1. Plavix 75 mg once per day with food x 4 weeks. 2. Atenolol 25 mg once per day. 3. Zantac 150 mg once per day. 4. Enteric-coated aspirin 325 mg once per day. 5. Vitamin E 400 international units once per day. 6. Multivitamin once per day. 7. Glucosamine 500 mg once per day.  DISCHARGE DIAGNOSES: 1. Angina with a positive treadmill test. 2. Status post cardiac catheterization revealing coronary artery  disease,    single vessel in his right coronary artery with subsequent percutaneous    transluminal coronary angioplasty and stenting. 3. Normal ejection fraction. 4. Family history of coronary disease. 5. Unknown cholesterol. 6. History of inguinal hernia repair.  DISCHARGE INSTRUCTIONS:  He should do no driving, no lifting, no sexual activity x 3 days.  He should be on a low fat diet.  If he has any problems with his groin, he will call out office.  FOLLOW-UP:  He will follow up with Dr. Elsie Higgins in approximately two weeks. DD:  10/19/99 TD:  10/19/99 Job: [id number]

## 2010-11-19 NOTE — Cardiovascular Report (Signed)
[place]. [facility]  Patient:    Joshua Higgins, Joshua Higgins                   MRN: [id number] Proc. Date: 10/18/99 Attending:  Madaline Savage, M.D. CC:         Marinus Maw, M.D.             Madaline Savage, M.D.             Cardiac Catheterization Laboratory                        Cardiac Catheterization  PROCEDURES PERFORMED: 1. Selective coronary angiography by Judkins technique. 2. Retrograde left heart catheterization. 3. Left ventricular angiography. 4. Direct coronary artery stenting of the ostium of the right coronary artery.  COMPLICATIONS:  None.  ENTRY SITE:  Right femoral.  DYE USED:  Omnipaque.  PATIENT PROFILE:  The patient is a 65 year old patient who has recently had episodes of chest discomfort and was referred by his primary care physician, Dr. Oneta Rack for a treadmill test because of family history.  The treadmill test was positive for ST segment depressions in the inferior leads and in V5, V6.  The patient presents as an outpatient for diagnostic cardiac catheterization.  RESULTS:  PRESSURES:  The left ventricular pressure was 120/14, central aortic pressure 120/70, mean of 90.  No aortic valve gradient by pullback technique.  ANGIOGRAPHIC RESULTS:  The left main coronary artery was normal.  Both the left  main and the LAD contain mild calcification.  The LAD coursed to the cardiac apex and wrapped around the apex, although calcifications were noted proximally.  No obstructive lesions were seen.  A diagonal branch arises very proximal and contains no lesions.  The circumflex coronary artery is dominant.  It gives rise to an obtuse marginal branch and a twin pair of distal posterior descending and posterolateral branches. No lesions were seen.  The right coronary artery is a 2.5 mm vessel.  It bifurcates into a sinus node branch and a right ventricular branch.  The ostium of this vessel is diseased, 5% severe.  It  damps pressure with intubation of the ostium.  I was able to find a  view where the vessel could be optimally visualized using a side-hole catheter nd that was found to be an 88 LAO view with 5 degrees of caudal angulation.  After the patient was given heparin and intravenous ReoPro, and the ACT was confirmed at greater than 200, I proceed with direct stenting of the vessel. The guide catheter provided good backup support.  The guidewire was steered out the  main body of the RCA.  The stent crossed the lesion easily.  I then pulled both the stent and the guide catheter out and made a flush injection and then inflated to 8 atmospheres on two occasions.  Before I could reenter the vessel for a 12 atmosphere inflation, the whole system came out of the vessel, but angiographically looked excellent.  The result showed no residual stenosis and TIMI-3 distal flow.  FINAL DIAGNOSES: 1. Normal left ventricular systolic function. 2. Single-vessel coronary artery disease consisting of ostial left anterior    descending stenosis, 75%. 3. Successful direct stenting of the ostial right coronary artery with reduction    of a 75% lesion to 0% residual. DD:  10/18/99 TD:  10/19/99 Job: [id number]

## 2010-11-19 NOTE — Op Note (Signed)
NAME:  Joshua Higgins, Joshua Higgins                       ACCOUNT NO.:  [id number]   MEDICAL RECORD NO.:  [id number]                   PATIENT TYPE:  OIB   LOCATION:  3011                                 FACILITY:  [facility]   PHYSICIAN:  Stefani Dama, M.D.               DATE OF BIRTH:  09-08-1945   DATE OF PROCEDURE:  03/03/2003  DATE OF DISCHARGE:                                 OPERATIVE REPORT   PREOPERATIVE DIAGNOSIS:  L4-L5 herniated nucleus pulposus with right L5  radiculopathy.   POSTOPERATIVE DIAGNOSIS:  L4-L5 herniated nucleus pulposus with right L5  radiculopathy.   OPERATION PERFORMED:  Right L4-L5 microendoscopic diskectomy with MET-Rx  system.  Operating microscope.  Microdissection technique.   SURGEON:  Stefani Dama, M.D.   ASSISTANT:  Clydene Fake, M.D.   ANESTHESIA:  General.   INDICATIONS FOR PROCEDURE:  The patient is a 65 year old individual who has  had significant back and right lower extremity pain with weakness.  He has  failed extensive efforts at conservative management including physical  therapy and nonsteroidal anti-inflammatories and exercise programs and now  has had pain for a period of four months' time.  He demonstrates a herniated  nucleus pulposus at L4-L5 on the right side with a portion of the fragment  extending up behind the body of L4.  He is now taken to the operating room  to undergo surgical decompression.   DESCRIPTION OF PROCEDURE:  The patient was brought to the operating room,  supine on the stretcher.  After smooth induction of general endotracheal  anesthesia, he was turned prone.  Neck was shaved, prepped with Hibiclens,  then draped in a sterile fashion.  Using fluoroscopic localization, the L4-  L5 interspace on the right side was localized first in the PA plane and then  in the lateral plane.  Then after infiltrating 8mL of 1% lidocaine with  epinephrine in the subcutaneous tissues, a vertical incision was made  overlying  the entry point.  A K-wire was then passed to the laminar arch of  L4.  Then using a wanding technique, a series of dilators was passed over  the K-wire to allow placement of an 18 mm diameter x 5 cm deep endoscopic  cannula.  This was fixed to the operating table with a clamp.  The  microscope was draped and brought into the field and then using  electrocautery, the soft tissues were cleared over the laminar arch and  anterior laminar space at L4-L5.  A laminectomy was then created using a  high speed air drill and a 2.3 mm dissecting tool to remove the inferior  margin of the lamina of L4 out to the mesial wall of the facet.  The yellow  ligament was then taken up and the common dural tube was explored.  In the  lateral aspect of the tube, there was noted to be a significant amount of  epidural fibrosis and this required gentle dissection with a microsurgical  technique to allow freeing up the common dural tube and removing it from the  lateral mass.  The mass itself was noted to be a somewhat encapsulated  fragment of disk which was ultimately removed.  This was up beyond the  interspace of L4-L5 behind the body of L4 on the inferior aspect of it.  Once this was removed, the nerve root could be further mobilized inferiorly  and further dissection was undertaken relieving further some other fragments  of disk behind the vertebral body of L4.  The disk space itself was  explored.  However, it was not entered as the ligament over it was found to  be intact and solid.  The area was checked for hemostasis very carefully in  the soft tissues.  Care was taken to make sure that the L5 nerve root and  the common dural tube were well  decompressed and in the end, 0.30mL of fentanyl was left in the epidural  space before removing the endoscopic retractor and then closing the fascia  with a single 3-0 Vicryl suture and 3-0 Vicryl was used then to close the  subcuticular skin.  Dermabond was placed on  the skin.  The estimated blood  loss was less than 50mL.                                                Stefani Dama, M.D.    Merla Riches  D:  03/03/2003  T:  03/03/2003  Job:  [id number]

## 2010-11-19 NOTE — Op Note (Signed)
NAME:  Joshua Higgins, Joshua Higgins                       ACCOUNT NO.:  [id number]   MEDICAL RECORD NO.:  [id number]                   PATIENT TYPE:  AMB   LOCATION:  ENDO                                 FACILITY:  [facility]   PHYSICIAN:  John C. Madilyn Fireman, M.D.                 DATE OF BIRTH:  Jul 12, 1945   DATE OF PROCEDURE:  10/21/2003  DATE OF DISCHARGE:                                 OPERATIVE REPORT   PROCEDURE:  Colonoscopy.   INDICATIONS FOR PROCEDURE:  Average risk colon cancer screening.   DESCRIPTION OF PROCEDURE:  The patient was placed in the left lateral  decubitus position and placed on the pulse monitor with continuous low-flow  oxygen delivered by nasal cannula.  He was sedated with 87.5 mcg IV fentanyl  and 8 mg IV Versed.  The Olympus video colonoscope was inserted into the  rectum and advanced to the cecum, confirmed by transillumination at  McBurney's point and visualization of the ileocecal valve and appendiceal  orifice.  The prep was excellent.  The cecum, ascending, transverse,  descending, and sigmoid colon all appeared normal with no masses, polyps,  diverticula, or other mucosal abnormalities.  The rectum likewise appeared  normal, and retroflexed view of the anus revealed no obvious internal  hemorrhoids.  The scope was then withdrawn, and the patient returned to the  recovery room in stable condition.  He tolerated the procedure well, and  there were no immediate complications.   IMPRESSION:  Normal colonoscopy.   PLAN:  Next colon screening by sigmoidoscopy in 5 years.                                               John C. Madilyn Fireman, M.D.    J[facility]/MEDQ  D:  10/21/2003  T:  10/22/2003  Job:  [id number]   cc:   [name], M.D.  [address]  [place], [place] [address]  Fax: [phone]

## 2011-04-12 LAB — BASIC METABOLIC PANEL
BUN: 10
BUN: 10
BUN: 11
BUN: 11
CO2: 25
CO2: 26
CO2: 29
CO2: 32
Calcium: 7.4 — ABNORMAL LOW
Calcium: 7.4 — ABNORMAL LOW
Calcium: 7.6 — ABNORMAL LOW
Calcium: 8.1 — ABNORMAL LOW
Chloride: 100
Chloride: 103
Chloride: 104
Chloride: 107
Creatinine, Ser: 0.72
Creatinine, Ser: 0.74
Creatinine, Ser: 0.89
Creatinine, Ser: 1.02
GFR calc Af Amer: >60
GFR calc Af Amer: >60
GFR calc Af Amer: >60
GFR calc Af Amer: >60
GFR calc non Af Amer: >60
GFR calc non Af Amer: >60
GFR calc non Af Amer: >60
GFR calc non Af Amer: >60
Glucose, Bld: 107 — ABNORMAL HIGH
Glucose, Bld: 109 — ABNORMAL HIGH
Glucose, Bld: 110 — ABNORMAL HIGH
Glucose, Bld: 142 — ABNORMAL HIGH
Potassium: 3.6
Potassium: 3.7
Potassium: 4.2
Potassium: 4.3
Sodium: 132 — ABNORMAL LOW
Sodium: 133 — ABNORMAL LOW
Sodium: 139
Sodium: 140

## 2011-04-12 LAB — CBC
HCT: 21.5 — ABNORMAL LOW
HCT: 24.3 — ABNORMAL LOW
HCT: 25.7 — ABNORMAL LOW
HCT: 26 — ABNORMAL LOW
HCT: 26.4 — ABNORMAL LOW
HCT: 26.7 — ABNORMAL LOW
HCT: 26.8 — ABNORMAL LOW
HCT: 28.4 — ABNORMAL LOW
HCT: 40.1
Hemoglobin: 13.7
Hemoglobin: 7.4 — CL
Hemoglobin: 8.5 — ABNORMAL LOW
Hemoglobin: 8.8 — ABNORMAL LOW
Hemoglobin: 8.9 — ABNORMAL LOW
Hemoglobin: 9.1 — ABNORMAL LOW
Hemoglobin: 9.2 — ABNORMAL LOW
Hemoglobin: 9.2 — ABNORMAL LOW
Hemoglobin: 9.8 — ABNORMAL LOW
MCHC: 34.1
MCHC: 34.1
MCHC: 34.1
MCHC: 34.2
MCHC: 34.5
MCHC: 34.5
MCHC: 34.6
MCHC: 34.8
MCHC: 34.9
MCV: 95.9
MCV: 96
MCV: 96.6
MCV: 97
MCV: 97.1
MCV: 97.5
MCV: 97.6
MCV: 98.6
MCV: 99.2
Platelets: 109 — ABNORMAL LOW
Platelets: 113 — ABNORMAL LOW
Platelets: 117 — ABNORMAL LOW
Platelets: 122 — ABNORMAL LOW
Platelets: 129 — ABNORMAL LOW
Platelets: 198
Platelets: 77 — ABNORMAL LOW
Platelets: 92 — ABNORMAL LOW
Platelets: 99 — ABNORMAL LOW
RBC: 2.2 — ABNORMAL LOW
RBC: 2.53 — ABNORMAL LOW
RBC: 2.66 — ABNORMAL LOW
RBC: 2.69 — ABNORMAL LOW
RBC: 2.72 — ABNORMAL LOW
RBC: 2.72 — ABNORMAL LOW
RBC: 2.79 — ABNORMAL LOW
RBC: 2.91 — ABNORMAL LOW
RBC: 4.05 — ABNORMAL LOW
RDW: 12.7
RDW: 12.9
RDW: 13
RDW: 13.5
RDW: 13.9
RDW: 14
RDW: 14.2
RDW: 14.3
RDW: 14.3
WBC: 4
WBC: 4.4
WBC: 4.8
WBC: 5.7
WBC: 5.9
WBC: 7.5
WBC: 7.6
WBC: 7.8
WBC: 8.3

## 2011-04-12 LAB — URINE MICROSCOPIC-ADD ON

## 2011-04-12 LAB — POCT I-STAT 4, (NA,K, GLUC, HGB,HCT)
Glucose, Bld: 102 — ABNORMAL HIGH
Glucose, Bld: 106 — ABNORMAL HIGH
Glucose, Bld: 107 — ABNORMAL HIGH
Glucose, Bld: 116 — ABNORMAL HIGH
Glucose, Bld: 121 — ABNORMAL HIGH
Glucose, Bld: 98
HCT: 19 — ABNORMAL LOW
HCT: 20 — ABNORMAL LOW
HCT: 22 — ABNORMAL LOW
HCT: 25 — ABNORMAL LOW
HCT: 27 — ABNORMAL LOW
HCT: 31 — ABNORMAL LOW
Hemoglobin: 10.5 — ABNORMAL LOW
Hemoglobin: 6.5 — CL
Hemoglobin: 6.8 — CL
Hemoglobin: 7.5 — CL
Hemoglobin: 8.5 — ABNORMAL LOW
Hemoglobin: 9.2 — ABNORMAL LOW
Operator id: 157281
Operator id: 300131
Operator id: 3390
Operator id: 3390
Operator id: 3390
Operator id: 3390
Potassium: 3.7
Potassium: 4.1
Potassium: 4.2
Potassium: 4.2
Potassium: 4.7
Potassium: 5.1
Sodium: 134 — ABNORMAL LOW
Sodium: 134 — ABNORMAL LOW
Sodium: 136
Sodium: 139
Sodium: 140
Sodium: 141

## 2011-04-12 LAB — PROTIME-INR
INR: 1
INR: 1.5
Prothrombin Time: 13.2
Prothrombin Time: 18.2 — ABNORMAL HIGH

## 2011-04-12 LAB — PREPARE RBC (CROSSMATCH)

## 2011-04-12 LAB — URINE CULTURE
Colony Count: NO GROWTH
Culture: NO GROWTH

## 2011-04-12 LAB — I-STAT EC8
Acid-Base Excess: 1
Acid-base deficit: 3 — ABNORMAL HIGH
BUN: 11
BUN: 13
Bicarbonate: 23
Bicarbonate: 27.2 — ABNORMAL HIGH
Chloride: 101
Chloride: 106
Glucose, Bld: 111 — ABNORMAL HIGH
Glucose, Bld: 156 — ABNORMAL HIGH
HCT: 25 — ABNORMAL LOW
HCT: 28 — ABNORMAL LOW
Hemoglobin: 8.5 — ABNORMAL LOW
Hemoglobin: 9.5 — ABNORMAL LOW
Operator id: 297351
Operator id: 300131
Potassium: 3.8
Potassium: 4.4
Sodium: 139
Sodium: 142
TCO2: 24
TCO2: 29
pCO2 arterial: 46.4 — ABNORMAL HIGH
pCO2 arterial: 49.7 — ABNORMAL HIGH
pH, Arterial: 7.302 — ABNORMAL LOW
pH, Arterial: 7.346 — ABNORMAL LOW

## 2011-04-12 LAB — BLOOD GAS, ARTERIAL
Acid-Base Excess: 2.8 — ABNORMAL HIGH
Bicarbonate: 27 — ABNORMAL HIGH
Drawn by: 206361
FIO2: 0.21
O2 Saturation: 96
Patient temperature: 98.6
TCO2: 28.3
pCO2 arterial: 42.5
pH, Arterial: 7.419
pO2, Arterial: 81.3

## 2011-04-12 LAB — POCT I-STAT 3, ART BLOOD GAS (G3+)
Acid-Base Excess: 1
Acid-base deficit: 1
Acid-base deficit: 1
Bicarbonate: 21.3
Bicarbonate: 23.4
Bicarbonate: 24.1 — ABNORMAL HIGH
Bicarbonate: 25 — ABNORMAL HIGH
O2 Saturation: 100
O2 Saturation: 100
O2 Saturation: 100
O2 Saturation: 100
Operator id: 157281
Operator id: 300131
Operator id: 3390
Operator id: 3390
Patient temperature: 31
Patient temperature: 35.7
Patient temperature: 37.1
Patient temperature: 37.5
TCO2: 22
TCO2: 24
TCO2: 25
TCO2: 26
pCO2 arterial: 26.4 — ABNORMAL LOW
pCO2 arterial: 26.9 — ABNORMAL LOW
pCO2 arterial: 32.5 — ABNORMAL LOW
pCO2 arterial: 36.8
pH, Arterial: 7.439
pH, Arterial: 7.46 — ABNORMAL HIGH
pH, Arterial: 7.508 — ABNORMAL HIGH
pH, Arterial: 7.545 — ABNORMAL HIGH
pO2, Arterial: 157 — ABNORMAL HIGH
pO2, Arterial: 167 — ABNORMAL HIGH
pO2, Arterial: 289 — ABNORMAL HIGH
pO2, Arterial: 402 — ABNORMAL HIGH

## 2011-04-12 LAB — COMPREHENSIVE METABOLIC PANEL
ALT: 21
AST: 21
Albumin: 4
Alkaline Phosphatase: 80
BUN: 14
CO2: 30
Calcium: 9.4
Chloride: 98
Creatinine, Ser: 0.84
GFR calc Af Amer: >60
GFR calc non Af Amer: >60
Glucose, Bld: 126 — ABNORMAL HIGH
Potassium: 4.2
Sodium: 137
Total Bilirubin: 0.9
Total Protein: 6.9

## 2011-04-12 LAB — URINALYSIS, ROUTINE W REFLEX MICROSCOPIC
Bilirubin Urine: NEGATIVE
Bilirubin Urine: NEGATIVE
Glucose, UA: NEGATIVE
Glucose, UA: NEGATIVE
Hgb urine dipstick: NEGATIVE
Hgb urine dipstick: NEGATIVE
Ketones, ur: NEGATIVE
Ketones, ur: NEGATIVE
Nitrite: NEGATIVE
Nitrite: NEGATIVE
Protein, ur: NEGATIVE
Protein, ur: NEGATIVE
Specific Gravity, Urine: 1.016
Specific Gravity, Urine: 1.022
Urobilinogen, UA: 1
Urobilinogen, UA: 1
pH: 6.5
pH: 7

## 2011-04-12 LAB — POCT I-STAT 3, VENOUS BLOOD GAS (G3P V)
Acid-base deficit: 3 — ABNORMAL HIGH
Bicarbonate: 21.8
O2 Saturation: 85
Operator id: 3390
Patient temperature: 31
TCO2: 23
pCO2, Ven: 26.7 — ABNORMAL LOW
pH, Ven: 7.495 — ABNORMAL HIGH
pO2, Ven: 33

## 2011-04-12 LAB — CULTURE, BLOOD (ROUTINE X 2)
Culture: NO GROWTH
Culture: NO GROWTH

## 2011-04-12 LAB — TYPE AND SCREEN
ABO/RH(D): A POS
Antibody Screen: NEGATIVE

## 2011-04-12 LAB — ABO/RH: ABO/RH(D): A POS

## 2011-04-12 LAB — CREATININE, SERUM
Creatinine, Ser: 0.84
GFR calc Af Amer: >60
GFR calc non Af Amer: >60

## 2011-04-12 LAB — HEMOGLOBIN AND HEMATOCRIT, BLOOD
HCT: 20.8 — ABNORMAL LOW
Hemoglobin: 7.3 — CL

## 2011-04-12 LAB — CK TOTAL AND CKMB (NOT AT ARMC)
CK, MB: 12.4 — ABNORMAL HIGH
Relative Index: 3.4 — ABNORMAL HIGH
Total CK: 362 — ABNORMAL HIGH

## 2011-04-12 LAB — POCT I-STAT GLUCOSE
Glucose, Bld: 127 — ABNORMAL HIGH
Operator id: 3390

## 2011-04-12 LAB — HEMOGLOBIN A1C
Hgb A1c MFr Bld: 6
Mean Plasma Glucose: 136

## 2011-04-12 LAB — MAGNESIUM
Magnesium: 2.2
Magnesium: 2.2
Magnesium: 2.4

## 2011-04-12 LAB — APTT
aPTT: 26
aPTT: 35

## 2011-04-12 LAB — PLATELET COUNT: Platelets: 124 — ABNORMAL LOW

## 2011-05-11 ENCOUNTER — Other Ambulatory Visit (HOSPITAL_COMMUNITY): Payer: Self-pay | Admitting: Internal Medicine

## 2011-05-11 ENCOUNTER — Ambulatory Visit (HOSPITAL_COMMUNITY)
Admission: RE | Admit: 2011-05-11 | Discharge: 2011-05-11 | Disposition: A | Discharge status: Home / Self Care | Payer: Medicare Other | Source: Ambulatory Visit | Attending: Internal Medicine | Admitting: Internal Medicine

## 2011-05-11 DIAGNOSIS — S93409A Sprain of unspecified ligament of unspecified ankle, initial encounter: Secondary | ICD-10-CM

## 2011-05-11 DIAGNOSIS — X500XXA Overexertion from strenuous movement or load, initial encounter: Secondary | ICD-10-CM | POA: Insufficient documentation

## 2011-08-17 DIAGNOSIS — E559 Vitamin D deficiency, unspecified: Secondary | ICD-10-CM | POA: Diagnosis not present

## 2011-08-17 DIAGNOSIS — I10 Essential (primary) hypertension: Secondary | ICD-10-CM | POA: Diagnosis not present

## 2011-08-17 DIAGNOSIS — Z79899 Other long term (current) drug therapy: Secondary | ICD-10-CM | POA: Diagnosis not present

## 2011-08-17 DIAGNOSIS — E119 Type 2 diabetes mellitus without complications: Secondary | ICD-10-CM | POA: Diagnosis not present

## 2011-08-17 DIAGNOSIS — E782 Mixed hyperlipidemia: Secondary | ICD-10-CM | POA: Diagnosis not present

## 2011-08-24 DIAGNOSIS — I10 Essential (primary) hypertension: Secondary | ICD-10-CM | POA: Diagnosis not present

## 2011-08-24 DIAGNOSIS — Z951 Presence of aortocoronary bypass graft: Secondary | ICD-10-CM | POA: Diagnosis not present

## 2011-08-24 DIAGNOSIS — I2510 Atherosclerotic heart disease of native coronary artery without angina pectoris: Secondary | ICD-10-CM | POA: Diagnosis not present

## 2011-09-02 DIAGNOSIS — E7800 Pure hypercholesterolemia, unspecified: Secondary | ICD-10-CM | POA: Diagnosis not present

## 2011-09-02 DIAGNOSIS — I2510 Atherosclerotic heart disease of native coronary artery without angina pectoris: Secondary | ICD-10-CM | POA: Diagnosis not present

## 2011-09-02 DIAGNOSIS — E119 Type 2 diabetes mellitus without complications: Secondary | ICD-10-CM | POA: Diagnosis not present

## 2011-09-02 DIAGNOSIS — I10 Essential (primary) hypertension: Secondary | ICD-10-CM | POA: Diagnosis not present

## 2011-11-17 DIAGNOSIS — E559 Vitamin D deficiency, unspecified: Secondary | ICD-10-CM | POA: Diagnosis not present

## 2011-11-17 DIAGNOSIS — E782 Mixed hyperlipidemia: Secondary | ICD-10-CM | POA: Diagnosis not present

## 2011-11-17 DIAGNOSIS — E119 Type 2 diabetes mellitus without complications: Secondary | ICD-10-CM | POA: Diagnosis not present

## 2011-11-17 DIAGNOSIS — Z79899 Other long term (current) drug therapy: Secondary | ICD-10-CM | POA: Diagnosis not present

## 2011-11-17 DIAGNOSIS — I10 Essential (primary) hypertension: Secondary | ICD-10-CM | POA: Diagnosis not present

## 2011-11-24 DIAGNOSIS — I10 Essential (primary) hypertension: Secondary | ICD-10-CM | POA: Diagnosis not present

## 2011-11-24 DIAGNOSIS — L570 Actinic keratosis: Secondary | ICD-10-CM | POA: Diagnosis not present

## 2012-02-27 DIAGNOSIS — E119 Type 2 diabetes mellitus without complications: Secondary | ICD-10-CM | POA: Diagnosis not present

## 2012-02-27 DIAGNOSIS — H2510 Age-related nuclear cataract, unspecified eye: Secondary | ICD-10-CM | POA: Diagnosis not present

## 2012-02-28 DIAGNOSIS — I10 Essential (primary) hypertension: Secondary | ICD-10-CM | POA: Diagnosis not present

## 2012-02-28 DIAGNOSIS — E559 Vitamin D deficiency, unspecified: Secondary | ICD-10-CM | POA: Diagnosis not present

## 2012-02-28 DIAGNOSIS — Z125 Encounter for screening for malignant neoplasm of prostate: Secondary | ICD-10-CM | POA: Diagnosis not present

## 2012-02-28 DIAGNOSIS — Z1212 Encounter for screening for malignant neoplasm of rectum: Secondary | ICD-10-CM | POA: Diagnosis not present

## 2012-02-28 DIAGNOSIS — Z79899 Other long term (current) drug therapy: Secondary | ICD-10-CM | POA: Diagnosis not present

## 2012-02-28 DIAGNOSIS — E782 Mixed hyperlipidemia: Secondary | ICD-10-CM | POA: Diagnosis not present

## 2012-02-28 DIAGNOSIS — E119 Type 2 diabetes mellitus without complications: Secondary | ICD-10-CM | POA: Diagnosis not present

## 2012-06-05 DIAGNOSIS — E119 Type 2 diabetes mellitus without complications: Secondary | ICD-10-CM | POA: Diagnosis not present

## 2012-06-05 DIAGNOSIS — E782 Mixed hyperlipidemia: Secondary | ICD-10-CM | POA: Diagnosis not present

## 2012-06-05 DIAGNOSIS — E559 Vitamin D deficiency, unspecified: Secondary | ICD-10-CM | POA: Diagnosis not present

## 2012-06-05 DIAGNOSIS — I10 Essential (primary) hypertension: Secondary | ICD-10-CM | POA: Diagnosis not present

## 2012-06-05 DIAGNOSIS — Z79899 Other long term (current) drug therapy: Secondary | ICD-10-CM | POA: Diagnosis not present

## 2012-08-20 DIAGNOSIS — Z951 Presence of aortocoronary bypass graft: Secondary | ICD-10-CM | POA: Diagnosis not present

## 2012-08-20 DIAGNOSIS — E782 Mixed hyperlipidemia: Secondary | ICD-10-CM | POA: Diagnosis not present

## 2012-08-20 DIAGNOSIS — I2510 Atherosclerotic heart disease of native coronary artery without angina pectoris: Secondary | ICD-10-CM | POA: Diagnosis not present

## 2012-09-03 DIAGNOSIS — E559 Vitamin D deficiency, unspecified: Secondary | ICD-10-CM | POA: Diagnosis not present

## 2012-09-03 DIAGNOSIS — I10 Essential (primary) hypertension: Secondary | ICD-10-CM | POA: Diagnosis not present

## 2012-09-03 DIAGNOSIS — Z79899 Other long term (current) drug therapy: Secondary | ICD-10-CM | POA: Diagnosis not present

## 2012-09-03 DIAGNOSIS — E119 Type 2 diabetes mellitus without complications: Secondary | ICD-10-CM | POA: Diagnosis not present

## 2012-09-03 DIAGNOSIS — E782 Mixed hyperlipidemia: Secondary | ICD-10-CM | POA: Diagnosis not present

## 2012-09-06 ENCOUNTER — Other Ambulatory Visit (HOSPITAL_COMMUNITY): Payer: Self-pay | Admitting: Internal Medicine

## 2012-09-06 DIAGNOSIS — R079 Chest pain, unspecified: Secondary | ICD-10-CM

## 2012-09-06 DIAGNOSIS — Z951 Presence of aortocoronary bypass graft: Secondary | ICD-10-CM

## 2012-09-11 ENCOUNTER — Ambulatory Visit (HOSPITAL_COMMUNITY)
Admission: RE | Admit: 2012-09-11 | Discharge: 2012-09-11 | Disposition: A | Discharge status: Home / Self Care | Payer: Medicare Other | Source: Ambulatory Visit | Attending: Internal Medicine | Admitting: Internal Medicine

## 2012-09-11 DIAGNOSIS — R079 Chest pain, unspecified: Secondary | ICD-10-CM | POA: Diagnosis not present

## 2012-09-11 DIAGNOSIS — I2510 Atherosclerotic heart disease of native coronary artery without angina pectoris: Secondary | ICD-10-CM | POA: Diagnosis not present

## 2012-09-11 DIAGNOSIS — Z951 Presence of aortocoronary bypass graft: Secondary | ICD-10-CM | POA: Diagnosis not present

## 2013-02-27 DIAGNOSIS — E559 Vitamin D deficiency, unspecified: Secondary | ICD-10-CM | POA: Diagnosis not present

## 2013-02-27 DIAGNOSIS — I10 Essential (primary) hypertension: Secondary | ICD-10-CM | POA: Diagnosis not present

## 2013-02-27 DIAGNOSIS — Z79899 Other long term (current) drug therapy: Secondary | ICD-10-CM | POA: Diagnosis not present

## 2013-02-27 DIAGNOSIS — Z1212 Encounter for screening for malignant neoplasm of rectum: Secondary | ICD-10-CM | POA: Diagnosis not present

## 2013-02-27 DIAGNOSIS — E782 Mixed hyperlipidemia: Secondary | ICD-10-CM | POA: Diagnosis not present

## 2013-02-27 DIAGNOSIS — E119 Type 2 diabetes mellitus without complications: Secondary | ICD-10-CM | POA: Diagnosis not present

## 2013-02-27 DIAGNOSIS — Z125 Encounter for screening for malignant neoplasm of prostate: Secondary | ICD-10-CM | POA: Diagnosis not present

## 2013-03-21 DIAGNOSIS — H40019 Open angle with borderline findings, low risk, unspecified eye: Secondary | ICD-10-CM | POA: Diagnosis not present

## 2013-03-21 DIAGNOSIS — H2510 Age-related nuclear cataract, unspecified eye: Secondary | ICD-10-CM | POA: Diagnosis not present

## 2013-03-21 DIAGNOSIS — E119 Type 2 diabetes mellitus without complications: Secondary | ICD-10-CM | POA: Diagnosis not present

## 2013-04-03 ENCOUNTER — Emergency Department (HOSPITAL_COMMUNITY)
Admission: EM | Admit: 2013-04-03 | Discharge: 2013-04-04 | Disposition: A | Discharge status: Home / Self Care | Payer: Medicare Other | Attending: Emergency Medicine | Admitting: Emergency Medicine

## 2013-04-03 ENCOUNTER — Emergency Department (HOSPITAL_COMMUNITY): Payer: Medicare Other

## 2013-04-03 ENCOUNTER — Encounter (HOSPITAL_COMMUNITY): Payer: Self-pay | Admitting: Cardiology

## 2013-04-03 DIAGNOSIS — G43009 Migraine without aura, not intractable, without status migrainosus: Secondary | ICD-10-CM

## 2013-04-03 DIAGNOSIS — E119 Type 2 diabetes mellitus without complications: Secondary | ICD-10-CM | POA: Insufficient documentation

## 2013-04-03 DIAGNOSIS — G43809 Other migraine, not intractable, without status migrainosus: Secondary | ICD-10-CM | POA: Diagnosis not present

## 2013-04-03 DIAGNOSIS — G43909 Migraine, unspecified, not intractable, without status migrainosus: Secondary | ICD-10-CM | POA: Diagnosis not present

## 2013-04-03 DIAGNOSIS — R413 Other amnesia: Secondary | ICD-10-CM | POA: Diagnosis not present

## 2013-04-03 HISTORY — DX: Type 2 diabetes mellitus without complications: E11.9

## 2013-04-03 MED ORDER — ACETAMINOPHEN 500 MG PO TABS
1000.0000 mg | ORAL_TABLET | Freq: Once | ORAL | Status: AC
  Administered 2013-04-04: 1000 mg via ORAL
  Filled 2013-04-03: qty 2

## 2013-04-03 NOTE — ED Notes (Signed)
Pt reports about 1300 this afternoon he had an episode where he was unable to remember anyone's name and then that passed. Reports that he then had a headache afterwards. No neuro deficits, states that he feels normal at this time.

## 2013-04-03 NOTE — ED Notes (Addendum)
Pt c/o having migraine headach after mild confusion and memory recall difficulties that passed spontaneously in about 30 minutes. Pt reports aura " squigglys" in vision prior to all S&S

## 2013-04-03 NOTE — ED Provider Notes (Signed)
CSN: [id number]     Arrival date & time 04/03/13  1805 History   First MD Initiated Contact with Patient 04/03/13 2257     Chief Complaint  Patient presents with  . Headache   (Consider location/radiation/quality/duration/timing/severity/associated sxs/prior Treatment) HPI This is a 67 year old male with a history of migraine headaches. His migraines are characterized as visual disturbance which she describes as "wavy lines" followed by a frontal headache. He has never had any cognitive or memory deficits with his migraines in the past. This afternoon about 1 PM he experienced his typical migraine aura consisting of the "wavy lines" noted above. This was followed by difficulty remembering names. He states he had difficulty remembering his daughter's name is within attempted to remember other family members names and was unable to do so. He also had difficulty ordering from a menu at [facility], not able to see Raynelle Fanning on the menu. His wife did not notice any abnormalities and was unaware of his symptoms until he told her about them. There was no associated numbness or weakness. There was no dysarthria or aphasia and his symptoms resolved within a few minutes. He subsequently has had a headache on the top of his head. It has been mild to moderate. It was partially relieved with Tylenol which he took about 3:30 PM. He is to have any Tylenol since. He is now asymptomatic.  Past Medical History  Diagnosis Date  . Diabetes mellitus without complication     type II   Past Surgical History  Procedure Laterality Date  . Cardiac surgery      Bypass  . Knee surgery    . Back surgery     History reviewed. No pertinent family history. History  Substance Use Topics  . Smoking status: Never Smoker   . Smokeless tobacco: Not on file  . Alcohol Use: No    Review of Systems  All other systems reviewed and are negative.    Allergies  Lipitor  Home Medications  No current outpatient prescriptions on  file. BP 160/69  Pulse 62  Temp(Src) 97.5 F (36.4 C) (Oral)  Resp 16  SpO2 99%  Physical Exam General: Well-developed, well-nourished male in no acute distress; appearance consistent with age of record HENT: normocephalic; atraumatic Eyes: pupils equal, round and reactive to light; extraocular muscles intact Neck: supple; no carotid bruit Heart: regular rate and rhythm; no murmurs, rubs or gallops Lungs: clear to auscultation bilaterally Abdomen: soft; nondistended Extremities: No deformity; full range of motion; pulses normal Neurologic: Awake, alert and oriented; motor function intact in all extremities and symmetric; no facial droop; normal coordination and speech; negative Romberg; normal finger to nose Skin: Warm and dry Psychiatric: Normal mood and affect    ED Course  Procedures (including critical care time)  MDM   Nursing notes and vitals signs, including pulse oximetry, reviewed.  Summary of this visit's results, reviewed by myself:  Labs:  Results for orders placed during the hospital encounter of 04/03/13 (from the past 24 hour(s))  CBC WITH DIFFERENTIAL     Status: Abnormal   Collection Time    04/03/13 11:40 PM      Result Value Range   WBC 5.8  4.0 - 10.5 K/uL   RBC 3.98 (*) 4.22 - 5.81 MIL/uL   Hemoglobin 13.8  13.0 - 17.0 g/dL   HCT 40.9  81.1 - 91.4 %   MCV 98.5  78.0 - 100.0 fL   MCH 34.7 (*) 26.0 - 34.0 pg  MCHC 35.2  30.0 - 36.0 g/dL   RDW 40.9  81.1 - 91.4 %   Platelets 151  150 - 400 K/uL   Neutrophils Relative % 38 (*) 43 - 77 %   Neutro Abs 2.2  1.7 - 7.7 K/uL   Lymphocytes Relative 47 (*) 12 - 46 %   Lymphs Abs 2.8  0.7 - 4.0 K/uL   Monocytes Relative 9  3 - 12 %   Monocytes Absolute 0.6  0.1 - 1.0 K/uL   Eosinophils Relative 5  0 - 5 %   Eosinophils Absolute 0.3  0.0 - 0.7 K/uL   Basophils Relative 0  0 - 1 %   Basophils Absolute 0.0  0.0 - 0.1 K/uL  BASIC METABOLIC PANEL     Status: Abnormal   Collection Time    04/03/13 11:40  PM      Result Value Range   Sodium 143  135 - 145 mEq/L   Potassium 4.7  3.5 - 5.1 mEq/L   Chloride 106  96 - 112 mEq/L   CO2 28  19 - 32 mEq/L   Glucose, Bld 109 (*) 70 - 99 mg/dL   BUN 14  6 - 23 mg/dL   Creatinine, Ser 7.82  0.50 - 1.35 mg/dL   Calcium 9.4  8.4 - 95.6 mg/dL   GFR calc non Af Amer 86 (*) >90 mL/min   GFR calc Af Amer >90  >90 mL/min    Imaging Studies: Ct Head Wo Contrast  04/03/2013   CLINICAL DATA:  Amnesia.  EXAM: CT HEAD WITHOUT CONTRAST  TECHNIQUE: Contiguous axial images were obtained from the base of the skull through the vertex without intravenous contrast.  COMPARISON:  None.  FINDINGS: Stable age related cerebral atrophy, ventriculomegaly and periventricular white matter disease. No extra-axial fluid collections are identified. No CT findings for acute hemispheric infarction or intracranial hemorrhage. No mass lesions. The brainstem and cerebellum are normal.  The bony structures are intact. The paranasal sinuses and mastoid air cells are clear.  IMPRESSION: Cerebral atrophy, ventriculomegaly and periventricular white matter disease.  No acute intracranial findings or mass lesion.   Electronically Signed   By: Loralie Champagne M.D.   On: 04/03/2013 23:51   EKG Interpretation:  Date & Time: 04/04/2013 12:13 AM  Rate: 64  Rhythm: normal sinus rhythm  QRS Axis: left  Intervals: normal  ST/T Wave abnormalities: T waves inferiorly  Conduction Disutrbances:none  Narrative Interpretation:   Old EKG Reviewed: none available  1:59 AM Neurologically intact. Awake, alert and oriented. Discussed with Dr. Roseanne Reno of neurology who agrees that this sounds like a complex migraine.      Hanley Seamen, MD 04/04/13 [phone]

## 2013-04-04 LAB — CBC WITH DIFFERENTIAL/PLATELET
Eosinophils Relative: 5 % (ref 0–5)
HCT: 39.2 % (ref 39.0–52.0)
Hemoglobin: 13.8 g/dL (ref 13.0–17.0)
Lymphocytes Relative: 47 % — ABNORMAL HIGH (ref 12–46)
Lymphs Abs: 2.8 10*3/uL (ref 0.7–4.0)
MCV: 98.5 fL (ref 78.0–100.0)
Monocytes Absolute: 0.6 10*3/uL (ref 0.1–1.0)
Monocytes Relative: 9 % (ref 3–12)
RBC: 3.98 MIL/uL — ABNORMAL LOW (ref 4.22–5.81)
WBC: 5.8 10*3/uL (ref 4.0–10.5)

## 2013-04-04 LAB — BASIC METABOLIC PANEL
CO2: 28 mEq/L (ref 19–32)
Calcium: 9.4 mg/dL (ref 8.4–10.5)
Creatinine, Ser: 0.88 mg/dL (ref 0.50–1.35)
Glucose, Bld: 109 mg/dL — ABNORMAL HIGH (ref 70–99)
Sodium: 143 mEq/L (ref 135–145)

## 2013-05-25 ENCOUNTER — Encounter: Payer: Self-pay | Admitting: Internal Medicine

## 2013-05-25 DIAGNOSIS — K589 Irritable bowel syndrome without diarrhea: Secondary | ICD-10-CM | POA: Insufficient documentation

## 2013-05-25 DIAGNOSIS — E1169 Type 2 diabetes mellitus with other specified complication: Secondary | ICD-10-CM | POA: Insufficient documentation

## 2013-05-25 DIAGNOSIS — E1129 Type 2 diabetes mellitus with other diabetic kidney complication: Secondary | ICD-10-CM | POA: Insufficient documentation

## 2013-05-25 DIAGNOSIS — K219 Gastro-esophageal reflux disease without esophagitis: Secondary | ICD-10-CM | POA: Insufficient documentation

## 2013-05-25 DIAGNOSIS — I10 Essential (primary) hypertension: Secondary | ICD-10-CM | POA: Insufficient documentation

## 2013-05-27 ENCOUNTER — Ambulatory Visit: Payer: Medicare Other | Admitting: Physician Assistant

## 2013-05-27 ENCOUNTER — Encounter: Payer: Self-pay | Admitting: Physician Assistant

## 2013-05-27 VITALS — BP 138/70 | HR 72 | Temp 97.9°F | Resp 16 | Ht 69.0 in | Wt 186.0 lb

## 2013-05-27 DIAGNOSIS — E785 Hyperlipidemia, unspecified: Secondary | ICD-10-CM

## 2013-05-27 DIAGNOSIS — E538 Deficiency of other specified B group vitamins: Secondary | ICD-10-CM

## 2013-05-27 DIAGNOSIS — E559 Vitamin D deficiency, unspecified: Secondary | ICD-10-CM

## 2013-05-27 DIAGNOSIS — Z79899 Other long term (current) drug therapy: Secondary | ICD-10-CM | POA: Diagnosis not present

## 2013-05-27 DIAGNOSIS — I10 Essential (primary) hypertension: Secondary | ICD-10-CM

## 2013-05-27 DIAGNOSIS — E119 Type 2 diabetes mellitus without complications: Secondary | ICD-10-CM | POA: Diagnosis not present

## 2013-05-27 DIAGNOSIS — E782 Mixed hyperlipidemia: Secondary | ICD-10-CM | POA: Diagnosis not present

## 2013-05-27 LAB — CBC WITH DIFFERENTIAL/PLATELET
Eosinophils Absolute: 0.2 10*3/uL (ref 0.0–0.7)
Eosinophils Relative: 4 % (ref 0–5)
Lymphs Abs: 2.3 10*3/uL (ref 0.7–4.0)
MCH: 33.5 pg (ref 26.0–34.0)
MCV: 97 fL (ref 78.0–100.0)
Platelets: 181 10*3/uL (ref 150–400)
RBC: 4.33 MIL/uL (ref 4.22–5.81)

## 2013-05-27 LAB — HEPATIC FUNCTION PANEL
AST: 18 U/L (ref 0–37)
Alkaline Phosphatase: 54 U/L (ref 39–117)
Bilirubin, Direct: 0.1 mg/dL (ref 0.0–0.3)
Indirect Bilirubin: 0.5 mg/dL (ref 0.0–0.9)
Total Bilirubin: 0.6 mg/dL (ref 0.3–1.2)
Total Protein: 7.4 g/dL (ref 6.0–8.3)

## 2013-05-27 LAB — BASIC METABOLIC PANEL WITH GFR
BUN: 13 mg/dL (ref 6–23)
CO2: 27 mEq/L (ref 19–32)
Chloride: 103 mEq/L (ref 96–112)
Creat: 1.04 mg/dL (ref 0.50–1.35)
GFR, Est Non African American: 73 mL/min
Potassium: 5 mEq/L (ref 3.5–5.3)
Sodium: 141 mEq/L (ref 135–145)

## 2013-05-27 LAB — LIPID PANEL
HDL: 48 mg/dL (ref >39)
LDL Cholesterol: 63 mg/dL (ref 0–99)
Total CHOL/HDL Ratio: 2.8 Ratio
Triglycerides: 116 mg/dL (ref <150)
VLDL: 23 mg/dL (ref 0–40)

## 2013-05-27 LAB — TSH: TSH: 1.721 u[IU]/mL (ref 0.350–4.500)

## 2013-05-27 NOTE — Progress Notes (Signed)
HPI Patient presents for 3 month follow up with hypertension, hyperlipidemia, prediabetes and vitamin D. Patient's blood pressure has been controlled at home. Patient denies chest pain, shortness of breath, dizziness.  Patient's cholesterol is diet controlled. In addition they are on crestor and denies myalgias. The cholesterol last visit was LDL 75. The patient has been working on diet and exercise for diabetes, and denies changes in vision, polys, and paresthesias.  A1C 6.3 Patient is on Vitamin D supplement.  Patient went to ER 04/03/2013 for migraine, states that he had migraine unable to remember people's names, has history of migraines and had negative CT head.  Current Medications:  Current Outpatient Prescriptions on File Prior to Visit  Medication Sig Dispense Refill  . ACYCLOVIR PO Take 1 tablet by mouth.      Marland Kitchen aspirin 325 MG tablet Take 325 mg by mouth daily.      Marland Kitchen atenolol (TENORMIN) 25 MG tablet Take 25 mg by mouth daily.      . Cholecalciferol 5000 UNITS TABS Take 1 tablet by mouth daily.      . IRON CR PO Take 1 tablet by mouth daily.      Marland Kitchen levothyroxine (SYNTHROID, LEVOTHROID) 50 MCG tablet Take 50 mcg by mouth daily before breakfast.      . metFORMIN (GLUCOPHAGE) 500 MG tablet Take 500 mg by mouth 3 (three) times daily.      . Multiple Vitamin (MULTIVITAMIN) tablet Take 1 tablet by mouth daily.      . Omega-3 Fatty Acids (FISH OIL) 1000 MG CAPS Take 1,000 mg by mouth daily.      . rosuvastatin (CRESTOR) 5 MG tablet Take 5 mg by mouth daily.       No current facility-administered medications on file prior to visit.   Medical History:  Past Medical History  Diagnosis Date  . Diabetes mellitus without complication     type II  . Hyperlipidemia   . Hypertension   . GERD (gastroesophageal reflux disease)   . IBS (irritable bowel syndrome)    Allergies:  Allergies  Allergen Reactions  . Ace Inhibitors Cough  . Lipitor [Atorvastatin] Other (See Comments)    Joint  pain.    ROS Constitutional: Denies fever, chills, weight loss/gain, headaches, insomnia, fatigue, night sweats, and change in appetite. Eyes: Denies redness, blurred vision, diplopia, discharge, itchy, watery eyes.  ENT: Denies discharge, congestion, post nasal drip, sore throat, earache, dental pain, Tinnitus, Vertigo, Sinus pain, snoring.  Cardio: Denies chest pain, palpitations, irregular heartbeat,  dyspnea, diaphoresis, orthopnea, PND, claudication, edema Respiratory: denies cough, dyspnea,pleurisy, hoarseness, wheezing.  Gastrointestinal: Denies dysphagia, heartburn,  water brash, pain, cramps, nausea, vomiting, bloating, diarrhea, constipation, hematemesis, melena, hematochezia,  hemorrhoids Genitourinary: Denies dysuria, frequency, urgency, nocturia, hesitancy, discharge, hematuria, flank pain Musculoskeletal: Denies arthralgia, myalgia, stiffness, Jt. Swelling, pain, limp, and strain/sprain. Skin: Denies pruritis, rash, hives, warts, acne, eczema, changing in skin lesion Neuro: Weakness, tremor, incoordination, spasms, paresthesia, pain Psychiatric: Denies confusion, memory loss, sensory loss Endocrine: Denies change in weight, skin, hair change, nocturia, and paresthesia, Diabetic Polys, visual blurring, hyper /hypo glycemic episodes.  Heme/Lymph: Excessive bleeding, bruising, enlarged lymph nodes  Family history- Review and unchanged Social history- Review and unchanged Physical Exam: Filed Vitals:   05/27/13 0935  BP: 138/70  Pulse: 72  Temp: 97.9 F (36.6 C)  Resp: 16   Filed Weights   05/27/13 0935  Weight: 186 lb (84.369 kg)   General Appearance: Well nourished, in no apparent distress. Eyes: PERRLA,  EOMs, conjunctiva no swelling or erythema, normal fundi and vessels. Sinuses: No Frontal/maxillary tenderness ENT/Mouth: Ext aud canals clear, with TMs without erythema, bulging.No erythema, swelling, or exudate on post pharynx.  Tonsils not swollen or erythematous.  Hearing normal.  Neck: Supple, thyroid normal.  Respiratory: Respiratory effort normal, BS equal bilaterally without rales, rhonchi, wheezing or stridor.  Cardio: Heart sounds normal, regular rate and rhythm without murmurs, rubs or gallops. Peripheral pulses brisk and equal bilaterally, without edema.  Abdomen: Flat, soft, with bowel sounds. Non tender, no guarding, rebound, hernias, masses, or organomegaly.  Lymphatics: Non tender without lymphadenopathy.  Musculoskeletal: Full ROM all peripheral extremities, joint stability, 5/5 strength, and normal gait. Skin: Warm, dry without rashes, ecchymosis. + large 5x71mm scaly nodule on scalp, suggest going to skin center Neuro: Cranial nerves intact, reflexes equal bilaterally. Normal muscle tone, no cerebellar symptoms. Sensation intact.  Psych: Awake and oriented X 3, normal affect, Insight and Judgment appropriate.   Assessment and Plan:  Hypertension: Continue medication, monitor blood pressure at home. Continue DASH diet. Cholesterol: Continue diet and exercise. Check cholesterol.  Diabetes-Continue diet and exercise. Check A1C Vitamin D Def- check level and continue medications.  Migraine with aura- have been coming on more frequently- suggest increasing atenolol to 50mg  and to keep journal to monitor for migraines- will check Mag Skin lesions- go to skin center, for head lesion  Quentin Mulling 9:59 AM

## 2013-05-27 NOTE — Patient Instructions (Signed)
Migraine Headache- can try to increase atenolol to 50mg  daily and keep a diary for triggers.  A migraine headache is an intense, throbbing pain on one or both sides of your head. A migraine can last for 30 minutes to several hours. CAUSES  The exact cause of a migraine headache is not always known. However, a migraine may be caused when nerves in the brain become irritated and release chemicals that cause inflammation. This causes pain. SYMPTOMS  Pain on one or both sides of your head.  Pulsating or throbbing pain.  Severe pain that prevents daily activities.  Pain that is aggravated by any physical activity.  Nausea, vomiting, or both.  Dizziness.  Pain with exposure to bright lights, loud noises, or activity.  General sensitivity to bright lights, loud noises, or smells. Before you get a migraine, you may get warning signs that a migraine is coming (aura). An aura may include:  Seeing flashing lights.  Seeing bright spots, halos, or zig-zag lines.  Having tunnel vision or blurred vision.  Having feelings of numbness or tingling.  Having trouble talking.  Having muscle weakness. MIGRAINE TRIGGERS  Alcohol.  Smoking.  Stress.  Aged cheeses.  Foods or drinks that contain nitrates, glutamate, aspartame, or tyramine.  Lack of sleep.  Chocolate.  Caffeine.  Hunger.  Physical exertion.  Fatigue.  Medicines used to treat chest pain (nitroglycerine), birth control pills, estrogen, and some blood pressure medicines. DIAGNOSIS  A migraine headache is often diagnosed based on:  Symptoms.  Physical examination.  A CT scan or MRI of your head. TREATMENT Medicines may be given for pain and nausea. Medicines can also be given to help prevent recurrent migraines.  HOME CARE INSTRUCTIONS  Only take over-the-counter or prescription medicines for pain or discomfort as directed by your caregiver. The use of long-term narcotics is not recommended.  Lie down in a  dark, quiet room when you have a migraine.  Keep a journal to find out what may trigger your migraine headaches. For example, write down:  What you eat and drink.  How much sleep you get.  Any change to your diet or medicines.  Limit alcohol consumption.  Quit smoking if you smoke.  Get 7 to 9 hours of sleep, or as recommended by your caregiver.  Limit stress.  Keep lights dim if bright lights bother you and make your migraines worse. SEEK IMMEDIATE MEDICAL CARE IF:   Your migraine becomes severe.  You have a fever.  You have a stiff neck.  You have vision loss.  You have muscular weakness or loss of muscle control.  You start losing your balance or have trouble walking.  You feel faint or pass out.  You have severe symptoms that are different from your first symptoms. MAKE SURE YOU:   Understand these instructions.  Will watch your condition.  Will get help right away if you are not doing well or get worse. Document Released: 06/20/2005 Document Revised: 09/12/2011 Document Reviewed: 06/10/2011 [facility] Patient Information 2014 [place], [place].

## 2013-05-28 LAB — INSULIN, FASTING: Insulin fasting, serum: 26 u[IU]/mL (ref 3–28)

## 2013-08-09 ENCOUNTER — Telehealth: Payer: Self-pay | Admitting: Cardiovascular Disease

## 2013-08-09 ENCOUNTER — Encounter: Payer: Self-pay | Admitting: Cardiovascular Disease

## 2013-08-09 ENCOUNTER — Ambulatory Visit (INDEPENDENT_AMBULATORY_CARE_PROVIDER_SITE_OTHER): Payer: Medicare Other | Admitting: Cardiovascular Disease

## 2013-08-09 VITALS — BP 152/66 | HR 73 | Resp 16 | Ht 71.0 in | Wt 189.6 lb

## 2013-08-09 DIAGNOSIS — E785 Hyperlipidemia, unspecified: Secondary | ICD-10-CM

## 2013-08-09 DIAGNOSIS — I10 Essential (primary) hypertension: Secondary | ICD-10-CM

## 2013-08-09 NOTE — Progress Notes (Signed)
Patient ID: OLSEN MCCUTCHAN, male   DOB: 1946-01-07, 68 y.o.   MRN: [id number]      Reason for office visit CAD   Roughly 7 years after coronary bypass surgery (LIMA to LAD, SVG to ramus intermedius and OM, SVG to acute marginal branch of RCA), Mr. Muegge has not had any new cardiac problems. Prior to bypass surgery he had had his right coronary artery stented but the stent subsequently occluded. He has preserved left ventricle systolic function and his nuclear stress test 2013 showed producing fair wall perfusion, but no reversible ischemia. He has hyperlipidemia hypertension and type 2 diabetes mellitus all treated with pharmacological agents. His last hemoglobin A1c was less than 7%. His primary care physician checks his lipids every 3 months and the patient states that his last set of labs was favorable. He has not had any chest pain shortness of breath edema dizziness palpitations or syncope. He was seen in the emergency room a couple of months ago with dysnomia that lasted for roughly 20 minutes and was attributed to migraines.  Allergies  Allergen Reactions  . Ace Inhibitors Cough  . Lipitor [Atorvastatin] Other (See Comments)    Joint pain.    Current Outpatient Prescriptions  Medication Sig Dispense Refill  . acetaminophen (TYLENOL) 500 MG tablet Take 500 mg by mouth every 6 (six) hours as needed.      . ACYCLOVIR PO Take 1 tablet by mouth.      Marland Kitchen aspirin 325 MG tablet Take 325 mg by mouth daily.      Marland Kitchen atenolol (TENORMIN) 25 MG tablet Take 25 mg by mouth daily.      . Cholecalciferol 5000 UNITS TABS Take 1 tablet by mouth daily.      . IRON CR PO Take 1 tablet by mouth daily.      Marland Kitchen levothyroxine (SYNTHROID, LEVOTHROID) 50 MCG tablet Take 50 mcg by mouth daily before breakfast.      . metFORMIN (GLUCOPHAGE) 500 MG tablet Take 500 mg by mouth 3 (three) times daily.      . Multiple Vitamin (MULTIVITAMIN) tablet Take 1 tablet by mouth daily.      . Omega-3 Fatty Acids (FISH  OIL) 1000 MG CAPS Take 1,000 mg by mouth daily.      . rosuvastatin (CRESTOR) 5 MG tablet Take 5 mg by mouth daily.       No current facility-administered medications for this visit.    Past Medical History  Diagnosis Date  . Diabetes mellitus without complication     type II  . Hyperlipidemia   . Hypertension   . GERD (gastroesophageal reflux disease)   . IBS (irritable bowel syndrome)   . Migraine headache with aura     Past Surgical History  Procedure Laterality Date  . Cardiac surgery      Bypass  . Knee surgery    . Back surgery      Family History  Problem Relation Age of Onset  . Ulcers Mother   . Arthritis Mother   . Heart attack Father   . Heart disease Sister   . Heart attack Brother   . Heart disease Brother   . Hypertension Brother   . Diabetes Maternal Grandmother     History   Social History  . Marital Status: Married    Spouse Name: N/A    Number of Children: N/A  . Years of Education: N/A   Occupational History  . Not on file.   Social  History Main Topics  . Smoking status: Never Smoker   . Smokeless tobacco: Not on file  . Alcohol Use: No  . Drug Use: No  . Sexual Activity: Not on file   Other Topics Concern  . Not on file   Social History Narrative  . No narrative on file    Review of systems: The patient specifically denies any chest pain at rest or with exertion, dyspnea at rest or with exertion, orthopnea, paroxysmal nocturnal dyspnea, syncope, palpitations, focal neurological deficits, intermittent claudication, lower extremity edema, unexplained weight gain, cough, hemoptysis or wheezing.  The patient also denies abdominal pain, nausea, vomiting, dysphagia, diarrhea, constipation, polyuria, polydipsia, dysuria, hematuria, frequency, urgency, abnormal bleeding or bruising, fever, chills, unexpected weight changes, mood swings, change in skin or hair texture, change in voice quality, auditory or visual problems, allergic reactions  or rashes, new musculoskeletal complaints other than usual "aches and pains".   PHYSICAL EXAM BP 152/66  Pulse 73  Resp 16  Ht 5\' 11"  (1.803 m)  Wt 86.002 kg (189 lb 9.6 oz)  BMI 26.46 kg/m2 Recheck 136/71 General: Alert, oriented x3, no distress Head: no evidence of trauma, PERRL, EOMI, no exophtalmos or lid lag, no myxedema, no xanthelasma; normal ears, nose and oropharynx Neck: normal jugular venous pulsations and no hepatojugular reflux; brisk carotid pulses without delay and no carotid bruits Chest: clear to auscultation, no signs of consolidation by percussion or palpation, normal fremitus, symmetrical and full respiratory excursions Cardiovascular: normal position and quality of the apical impulse, regular rhythm, normal first and second heart sounds, no murmurs, rubs or gallops Abdomen: no tenderness or distention, no masses by palpation, no abnormal pulsatility or arterial bruits, normal bowel sounds, no hepatosplenomegaly Extremities: no clubbing, cyanosis or edema; 2+ radial, ulnar and brachial pulses bilaterally; 2+ right femoral, posterior tibial and dorsalis pedis pulses; 2+ left femoral, posterior tibial and dorsalis pedis pulses; no subclavian or femoral bruits Neurological: grossly nonfocal   EKG: NSR, LAA, probable LVH, minor ST-T changes. Similar to older tracings  Lipid Panel     Component Value Date/Time   CHOL 134 05/27/2013 1014   TRIG 116 05/27/2013 1014   HDL 48 05/27/2013 1014   CHOLHDL 2.8 05/27/2013 1014   VLDL 23 05/27/2013 1014   LDLCALC 63 05/27/2013 1014    BMET    Component Value Date/Time   NA 141 05/27/2013 1014   K 5.0 05/27/2013 1014   CL 103 05/27/2013 1014   CO2 27 05/27/2013 1014   GLUCOSE 120* 05/27/2013 1014   BUN 13 05/27/2013 1014   CREATININE 1.04 05/27/2013 1014   CREATININE 0.88 04/03/2013 2340   CALCIUM 9.9 05/27/2013 1014   GFRNONAA 86* 04/03/2013 2340   GFRAA >90 04/03/2013 2340     ASSESSMENT AND PLAN  Mr. Duval is  asymptomatic 7 years after coronary bypass surgery and has well addressed coronary risk factors. He is minimally overweight. He is encouraged to persist in a program of daily physical exercise and try to lose a few pounds and get down to the waistline of no more than 34 inches. He was never really symptomatic even when he had an occluded coronary artery and the diagnosis of coronary disease has all been made based on stress testing. We should perform this on a periodic basis. He had a graded exercise stress test in March of 2014 that was normal.  Patient Instructions  Your physician recommends that you schedule a follow-up appointment in: [place].  Orders Placed This Encounter  Procedures  . EKG 12-Lead   Meds ordered this encounter  Medications  . acetaminophen (TYLENOL) 500 MG tablet    Sig: Take 500 mg by mouth every 6 (six) hours as needed.    Holli Humbles, MD, [place] [phone] office [phone] pager

## 2013-08-09 NOTE — Patient Instructions (Signed)
Your physician recommends that you schedule a follow-up appointment in: ONE YEAR 

## 2013-08-09 NOTE — Telephone Encounter (Signed)
Returned call and pt verified x 2.  Pt stated Dr. Melford Aase increased his atenolol to 50 mg b/c of problems he was having w/ his migraines.  Pt informed med list will be updated.  Pt verbalized understanding and agreed w/ plan.

## 2013-08-09 NOTE — Telephone Encounter (Signed)
He was here to see Dr C this morning and forgot to tell him he was taking Atenolol 50 mg instead of 25 mg.

## 2013-08-14 ENCOUNTER — Encounter: Payer: Self-pay | Admitting: Cardiovascular Disease

## 2013-09-15 ENCOUNTER — Other Ambulatory Visit: Payer: Self-pay | Admitting: Internal Medicine

## 2013-09-24 ENCOUNTER — Encounter: Payer: Self-pay | Admitting: Internal Medicine

## 2013-09-24 ENCOUNTER — Ambulatory Visit (INDEPENDENT_AMBULATORY_CARE_PROVIDER_SITE_OTHER): Payer: Medicare Other | Admitting: Internal Medicine

## 2013-09-24 VITALS — BP 128/60 | HR 60 | Temp 97.7°F | Resp 16 | Ht 69.0 in | Wt 187.0 lb

## 2013-09-24 DIAGNOSIS — I10 Essential (primary) hypertension: Secondary | ICD-10-CM | POA: Diagnosis not present

## 2013-09-24 DIAGNOSIS — Z79899 Other long term (current) drug therapy: Secondary | ICD-10-CM | POA: Insufficient documentation

## 2013-09-24 DIAGNOSIS — E1129 Type 2 diabetes mellitus with other diabetic kidney complication: Secondary | ICD-10-CM

## 2013-09-24 DIAGNOSIS — E559 Vitamin D deficiency, unspecified: Secondary | ICD-10-CM

## 2013-09-24 DIAGNOSIS — E785 Hyperlipidemia, unspecified: Secondary | ICD-10-CM | POA: Diagnosis not present

## 2013-09-24 LAB — CBC WITH DIFFERENTIAL/PLATELET
Basophils Absolute: 0 10*3/uL (ref 0.0–0.1)
Basophils Relative: 1 % (ref 0–1)
Eosinophils Absolute: 0.1 10*3/uL (ref 0.0–0.7)
Eosinophils Relative: 3 % (ref 0–5)
HCT: 41 % (ref 39.0–52.0)
Hemoglobin: 14.1 g/dL (ref 13.0–17.0)
LYMPHS ABS: 1.8 10*3/uL (ref 0.7–4.0)
Lymphocytes Relative: 40 % (ref 12–46)
MCH: 33.6 pg (ref 26.0–34.0)
MCHC: 34.4 g/dL (ref 30.0–36.0)
MCV: 97.6 fL (ref 78.0–100.0)
Monocytes Absolute: 0.4 10*3/uL (ref 0.1–1.0)
Monocytes Relative: 9 % (ref 3–12)
NEUTROS ABS: 2.1 10*3/uL (ref 1.7–7.7)
NEUTROS PCT: 47 % (ref 43–77)
Platelets: 182 10*3/uL (ref 150–400)
RBC: 4.2 MIL/uL — AB (ref 4.22–5.81)
RDW: 13.5 % (ref 11.5–15.5)
WBC: 4.4 10*3/uL (ref 4.0–10.5)

## 2013-09-24 LAB — BASIC METABOLIC PANEL WITH GFR
BUN: 14 mg/dL (ref 6–23)
CO2: 30 meq/L (ref 19–32)
Calcium: 9.7 mg/dL (ref 8.4–10.5)
Chloride: 102 mEq/L (ref 96–112)
Creat: 0.99 mg/dL (ref 0.50–1.35)
GFR, Est African American: 89 mL/min
GFR, Est Non African American: 77 mL/min
GLUCOSE: 136 mg/dL — AB (ref 70–99)
POTASSIUM: 4.4 meq/L (ref 3.5–5.3)
SODIUM: 141 meq/L (ref 135–145)

## 2013-09-24 LAB — HEMOGLOBIN A1C
Hgb A1c MFr Bld: 6.8 % — ABNORMAL HIGH (ref <5.7)
Mean Plasma Glucose: 148 mg/dL — ABNORMAL HIGH (ref <117)

## 2013-09-24 LAB — LIPID PANEL
CHOL/HDL RATIO: 3 ratio
CHOLESTEROL: 130 mg/dL (ref 0–200)
HDL: 43 mg/dL (ref >39)
LDL Cholesterol: 57 mg/dL (ref 0–99)
Triglycerides: 149 mg/dL (ref <150)
VLDL: 30 mg/dL (ref 0–40)

## 2013-09-24 LAB — HEPATIC FUNCTION PANEL
ALT: 22 U/L (ref 0–53)
AST: 19 U/L (ref 0–37)
Albumin: 4.6 g/dL (ref 3.5–5.2)
Alkaline Phosphatase: 47 U/L (ref 39–117)
BILIRUBIN DIRECT: 0.2 mg/dL (ref 0.0–0.3)
BILIRUBIN INDIRECT: 0.5 mg/dL (ref 0.2–1.2)
BILIRUBIN TOTAL: 0.7 mg/dL (ref 0.2–1.2)
Total Protein: 7 g/dL (ref 6.0–8.3)

## 2013-09-24 LAB — TSH: TSH: 1.501 u[IU]/mL (ref 0.350–4.500)

## 2013-09-24 LAB — MAGNESIUM: Magnesium: 2 mg/dL (ref 1.5–2.5)

## 2013-09-24 NOTE — Progress Notes (Signed)
Patient ID: Joshua Higgins, male   DOB: 21-Jul-1945, 68 y.o.   MRN: [id number]    This very nice 68 y.o. MWM presents for 3 month follow up with Hypertension, Hyperlipidemia, T2 NIDDM and Vitamin D Deficiency.    HTN predates since 1989 and had a PTCA in 2001 and later in 2008 had a CABG and has done well since. BP has been controlled at home. Today's BP: 128/60 mmHg . Patient denies any cardiac type chest pain, palpitations, dyspnea/orthopnea/PND, dizziness, claudication, or dependent edema.   Hyperlipidemia is controlled with diet & meds. Last Cholesterol was 134, Triglycerides were 116, HDL 48 and LDL  63. Patient denies myalgias or other med SE's.   Also, the patient has history of T2 NIDDM w/ Stage II CKD (GFR 73 ml/min) and last A1c was 6.4% in Nov 2014. Patient reports glucoses range less than the 120's. Patient denies any symptoms of reactive hypoglycemia, diabetic polys, paresthesias or visual blurring.   Further, Patient has history of Vitamin D Deficiency of 75 in 2008 with last vitamin D of 89 in Aug 2014. Patient supplements vitamin D without any suspected side-effects.  Medication Sig  . acetaminophen (TYLENOL) 500 MG tablet Take 500 mg by mouth every 6 (six) hours as needed.  . ACYCLOVIR PO Take 1 tablet by mouth.  Marland Kitchen aspirin 325 MG tablet Take 325 mg by mouth daily.  . Cholecalciferol 5000 UNITS TABS Take 1 tablet by mouth daily.  . IRON CR PO Take 1 tablet by mouth daily.  Marland Kitchen levothyroxine  50 MCG tablet Take 50 mcg by mouth daily before breakfast.  . metFORMIN (GLUCOPHAGE) 500 MG tablet TAKE 4 TABLETS BY MOUTH DAILY  . Multiple Vitamin (MULTIVITAMIN) tablet Take 1 tablet by mouth daily.  . Omega-3 Fatty Acids (FISH OIL) 1000 MG CAPS Take 1,000 mg by mouth daily.  . rosuvastatin (CRESTOR) 5 MG tablet Take 5 mg by mouth daily.     Allergies  Allergen Reactions  . Ace Inhibitors Cough  . Lipitor [Atorvastatin] Other (See Comments)    Joint pain.    PMHx:   Past Medical  History  Diagnosis Date  . Diabetes mellitus without complication     type II  . Hyperlipidemia   . Hypertension   . GERD (gastroesophageal reflux disease)   . IBS (irritable bowel syndrome)   . Migraine headache with aura     FHx:    Reviewed / unchanged  SHx:    Reviewed / unchanged   Systems Review: Constitutional: Denies fever, chills, wt changes, headaches, insomnia, fatigue, night sweats, change in appetite. Eyes: Denies redness, blurred vision, diplopia, discharge, itchy, watery eyes.  ENT: Denies discharge, congestion, post nasal drip, epistaxis, sore throat, earache, hearing loss, dental pain, tinnitus, vertigo, sinus pain, snoring.  CV: Denies chest pain, palpitations, irregular heartbeat, syncope, dyspnea, diaphoresis, orthopnea, PND, claudication, edema. Respiratory: denies cough, dyspnea, DOE, pleurisy, hoarseness, laryngitis, wheezing.  Gastrointestinal: Denies dysphagia, odynophagia, heartburn, reflux, water brash, abdominal pain or cramps, nausea, vomiting, bloating, diarrhea, constipation, hematemesis, melena, hematochezia,  or hemorrhoids. Genitourinary: Denies dysuria, frequency, urgency, nocturia, hesitancy, discharge, hematuria, flank pain. Musculoskeletal: Denies arthralgias, myalgias, stiffness, jt. swelling, pain, limp, strain/sprain.  Skin: Denies pruritus, rash, hives, warts, acne, eczema, change in skin lesion(s). Neuro: No weakness, tremor, incoordination, spasms, paresthesia, or pain. Psychiatric: Denies confusion, memory loss, or sensory loss. Endo: Denies change in weight, skin, hair change.  Heme/Lymph: No excessive bleeding, bruising, orenlarged lymph nodes.   Exam:  BP 128/60  Pulse  60  Temp(Src) 97.7 F (36.5 C) (Temporal)  Resp 16  Ht 5\' 9"  (1.753 m)  Wt 187 lb (84.823 kg)  BMI 27.60 kg/m2  Appears well nourished - in no distress. Eyes: PERRLA, EOMs, conjunctiva no swelling or erythema. Sinuses: No frontal/maxillary  tenderness ENT/Mouth: EAC's clear, TM's nl w/o erythema, bulging. Nares clear w/o erythema, swelling, exudates. Oropharynx clear without erythema or exudates. Oral hygiene is good. Tongue normal, non obstructing. Hearing intact.  Neck: Supple. Thyroid nl. Car 2+/2+ without bruits, nodes or JVD. Chest: Respirations nl with BS clear & equal w/o rales, rhonchi, wheezing or stridor.  Cor: Heart sounds normal w/ regular rate and rhythm without sig. murmurs, gallops, clicks, or rubs. Peripheral pulses normal and equal  without edema.  Abdomen: Soft & bowel sounds normal. Non-tender w/o guarding, rebound, hernias, masses, or organomegaly.  Lymphatics: Unremarkable.  Musculoskeletal: Full ROM all peripheral extremities, joint stability, 5/5 strength, and normal gait.  Skin: Warm, dry without exposed rashes, lesions, ecchymosis apparent.  Neuro: Cranial nerves intact, reflexes equal bilaterally. Sensory-motor testing grossly intact. Tendon reflexes grossly intact.  Pysch: Alert & oriented x 3. Insight and judgement nl & appropriate. No ideations.  Assessment and Plan:  1. Hypertension - Continue monitor blood pressure at home. Continue diet/meds same.  2. Hyperlipidemia - Continue diet/meds, exercise,& lifestyle modifications. Continue monitor periodic cholesterol/liver & renal functions   3. T2 NIDDM w/Stage II CKD (GFR 73) - continue recommend prudent low glycemic diet, weight control, regular exercise, diabetic monitoring and periodic eye exams.  4. Vitamin D Deficiency - Continue supplementation.  Recommended regular exercise, BP monitoring, weight control, and discussed med and SE's. Recommended labs to assess and monitor clinical status. Further disposition pending results of labs.

## 2013-09-24 NOTE — Patient Instructions (Signed)

## 2013-09-25 LAB — INSULIN, FASTING: Insulin fasting, serum: 32 u[IU]/mL — ABNORMAL HIGH (ref 3–28)

## 2013-09-25 LAB — VITAMIN D 25 HYDROXY (VIT D DEFICIENCY, FRACTURES): VIT D 25 HYDROXY: 71 ng/mL (ref 30–89)

## 2013-09-30 ENCOUNTER — Encounter: Payer: Self-pay | Admitting: Internal Medicine

## 2013-10-01 ENCOUNTER — Other Ambulatory Visit: Payer: Self-pay | Admitting: Emergency Medicine

## 2013-10-01 ENCOUNTER — Encounter: Payer: Self-pay | Admitting: Emergency Medicine

## 2013-10-01 ENCOUNTER — Other Ambulatory Visit: Payer: Self-pay | Admitting: Internal Medicine

## 2013-10-01 ENCOUNTER — Ambulatory Visit: Payer: Medicare Other | Admitting: Emergency Medicine

## 2013-10-01 VITALS — BP 128/82 | HR 62 | Temp 98.0°F | Resp 16 | Ht 69.0 in | Wt 184.0 lb

## 2013-10-01 DIAGNOSIS — R239 Unspecified skin changes: Secondary | ICD-10-CM

## 2013-10-01 MED ORDER — SILDENAFIL CITRATE 20 MG PO TABS: 20.0000 mg | ORAL_TABLET | Freq: Three times a day (TID) | ORAL | Status: DC

## 2013-10-01 NOTE — Progress Notes (Signed)
Patient ID: Joshua Higgins, male   DOB: 03-Sep-1945, 68 y.o.   MRN: [id number] Patient has 4 skin changes that he wants removed. @ on scalp, 1 right neck, 1 right cheek. Concern for squamous cell on cheek advised no OV today, he will schedule f/u with Dr Willaim Sheng for removal.

## 2013-10-31 DIAGNOSIS — Z85828 Personal history of other malignant neoplasm of skin: Secondary | ICD-10-CM | POA: Diagnosis not present

## 2013-10-31 DIAGNOSIS — L905 Scar conditions and fibrosis of skin: Secondary | ICD-10-CM | POA: Diagnosis not present

## 2013-10-31 DIAGNOSIS — L570 Actinic keratosis: Secondary | ICD-10-CM | POA: Diagnosis not present

## 2013-12-26 ENCOUNTER — Encounter: Payer: Self-pay | Admitting: Emergency Medicine

## 2013-12-26 ENCOUNTER — Ambulatory Visit (INDEPENDENT_AMBULATORY_CARE_PROVIDER_SITE_OTHER): Payer: Medicare Other | Admitting: Emergency Medicine

## 2013-12-26 VITALS — BP 126/80 | HR 64 | Temp 98.0°F | Resp 18 | Ht 69.0 in | Wt 189.0 lb

## 2013-12-26 DIAGNOSIS — E782 Mixed hyperlipidemia: Secondary | ICD-10-CM

## 2013-12-26 DIAGNOSIS — I10 Essential (primary) hypertension: Secondary | ICD-10-CM

## 2013-12-26 DIAGNOSIS — Z23 Encounter for immunization: Secondary | ICD-10-CM

## 2013-12-26 DIAGNOSIS — E119 Type 2 diabetes mellitus without complications: Secondary | ICD-10-CM | POA: Diagnosis not present

## 2013-12-26 LAB — LIPID PANEL
Cholesterol: 159 mg/dL (ref 0–200)
HDL: 44 mg/dL (ref >39)
LDL CALC: 84 mg/dL (ref 0–99)
Total CHOL/HDL Ratio: 3.6 Ratio
Triglycerides: 154 mg/dL — ABNORMAL HIGH (ref <150)
VLDL: 31 mg/dL (ref 0–40)

## 2013-12-26 LAB — BASIC METABOLIC PANEL WITH GFR
BUN: 18 mg/dL (ref 6–23)
CALCIUM: 10 mg/dL (ref 8.4–10.5)
CO2: 30 meq/L (ref 19–32)
Chloride: 101 mEq/L (ref 96–112)
Creat: 1.03 mg/dL (ref 0.50–1.35)
GFR, EST AFRICAN AMERICAN: 85 mL/min
GFR, Est Non African American: 73 mL/min
Glucose, Bld: 136 mg/dL — ABNORMAL HIGH (ref 70–99)
POTASSIUM: 4.5 meq/L (ref 3.5–5.3)
SODIUM: 142 meq/L (ref 135–145)

## 2013-12-26 LAB — HEPATIC FUNCTION PANEL
ALBUMIN: 4.7 g/dL (ref 3.5–5.2)
ALT: 22 U/L (ref 0–53)
AST: 19 U/L (ref 0–37)
Alkaline Phosphatase: 47 U/L (ref 39–117)
BILIRUBIN INDIRECT: 0.7 mg/dL (ref 0.2–1.2)
Bilirubin, Direct: 0.2 mg/dL (ref 0.0–0.3)
TOTAL PROTEIN: 7.2 g/dL (ref 6.0–8.3)
Total Bilirubin: 0.9 mg/dL (ref 0.2–1.2)

## 2013-12-26 LAB — HEMOGLOBIN A1C
HEMOGLOBIN A1C: 6.6 % — AB (ref <5.7)
MEAN PLASMA GLUCOSE: 143 mg/dL — AB (ref <117)

## 2013-12-26 MED ORDER — ROSUVASTATIN CALCIUM 20 MG PO TABS: 20.0000 mg | ORAL_TABLET | Freq: Every day | ORAL | Status: DC

## 2013-12-26 NOTE — Progress Notes (Signed)
Subjective:    Patient ID: Joshua Higgins, male    DOB: [birth year], 68 y.o.   MRN: [id number]  HPI Comments: 68 yo WM presents for 3 month F/U for HTN, Cholesterol, DM, D. Deficient. He notes BS and BP good. He is modifying portions. He stays active. He checks feet routinely and denies skin break down or neuropathy increase.   He notes ED was helped a little with Reviato. He would like to try different samples before trying a prescription.    WBC             4.4   09/24/2013 HGB            14.1   09/24/2013 HCT            41.0   09/24/2013 PLT             182   09/24/2013 GLUCOSE         136   09/24/2013 CHOL            130   09/24/2013 TRIG            149   09/24/2013 HDL              43   09/24/2013 LDLCALC          57   09/24/2013 ALT              22   09/24/2013 AST              19   09/24/2013 NA              141   09/24/2013 K               4.4   09/24/2013 CL              102   09/24/2013 CREATININE     0.99   09/24/2013 BUN              14   09/24/2013 CO2              30   09/24/2013 TSH           1.501   09/24/2013 INR             1.5   05/28/2007 HGBA1C          6.8   09/24/2013   Hyperlipidemia  Hypertension      Medication List       This list is accurate as of: 12/26/13  9:21 AM.  Always use your most recent med list.               ACYCLOVIR PO  Take 1 tablet by mouth as needed.     aspirin 325 MG tablet  Take 325 mg by mouth daily.     atenolol 50 MG tablet  Commonly known as:  TENORMIN  TAKE 1 TABLET BY MOUTH EVERY DAY     Cholecalciferol 5000 UNITS Tabs  Take 1 tablet by mouth daily.     Fish Oil 1000 MG Caps  Take 1,000 mg by mouth daily.     IRON CR PO  Take 1 tablet by mouth daily.     levothyroxine 50 MCG tablet  Commonly known as:  SYNTHROID, LEVOTHROID  Take 50 mcg by mouth daily before breakfast.     metFORMIN 500 MG tablet  Commonly known as:  GLUCOPHAGE  TAKE 4 TABLETS BY MOUTH  DAILY     multivitamin tablet  Take 1 tablet by mouth  daily.     rosuvastatin 5 MG tablet  Commonly known as:  CRESTOR  Take 5 mg by mouth daily.     sildenafil 20 MG tablet  Commonly known as:  REVATIO  Take 1 tablet (20 mg total) by mouth 3 (three) times daily.       Allergies  Allergen Reactions  . Ace Inhibitors Cough  . Lipitor [Atorvastatin] Other (See Comments)    Joint pain.    Past Medical History  Diagnosis Date  . Diabetes mellitus without complication     type II  . Hyperlipidemia   . Hypertension   . GERD (gastroesophageal reflux disease)   . IBS (irritable bowel syndrome)   . Migraine headache with aura      Review of Systems  All other systems reviewed and are negative.  BP 126/80  Pulse 64  Temp(Src) 98 F (36.7 C) (Temporal)  Resp 18  Ht 5\' 9"  (1.753 m)  Wt 189 lb (85.73 kg)  BMI 27.90 kg/m2     Objective:   Physical Exam  Nursing note and vitals reviewed. Constitutional: He is oriented to person, place, and time. He appears well-developed and well-nourished.  HENT:  Head: Normocephalic and atraumatic.  Right Ear: External ear normal.  Left Ear: External ear normal.  Nose: Nose normal.  Eyes: Conjunctivae and EOM are normal.  Neck: Normal range of motion. Neck supple. No JVD present. No thyromegaly present.  Cardiovascular: Normal rate, regular rhythm, normal heart sounds and intact distal pulses.   Pulmonary/Chest: Effort normal and breath sounds normal.  Abdominal: Soft. Bowel sounds are normal. He exhibits no distension and no mass. There is no tenderness. There is no rebound and no guarding.  Musculoskeletal: Normal range of motion. He exhibits no edema and no tenderness.  Lymphadenopathy:    He has no cervical adenopathy.  Neurological: He is alert and oriented to person, place, and time. No cranial nerve deficit. Coordination normal.  Skin: Skin is warm and dry.  Psychiatric: He has a normal mood and affect. His behavior is normal. Judgment and thought content normal.           Assessment & Plan:  1.  3 month F/U for HTN, Cholesterol,DM, D. Deficient. Needs healthy diet, cardio QD and obtain healthy weight. Check Labs, Check BP if >130/80 call office, Check BS if >200 call office  2. ED- SX given of levitra/ Cialis.  Advised do not use together. Call with results

## 2013-12-26 NOTE — Patient Instructions (Signed)
Erectile Dysfunction Erectile dysfunction is the inability to get or sustain a good enough erection to have sexual intercourse. Erectile dysfunction may involve:  Inability to get an erection.  Lack of enough hardness to allow penetration.  Loss of the erection before sex is finished.  Premature ejaculation. CAUSES  Certain drugs, such as:  Pain relievers.  Antihistamines.  Antidepressants.  Blood pressure medicines.  Water pills (diuretics).  Ulcer medicines.  Muscle relaxants.  Illegal drugs.  Excessive drinking.  Psychological causes, such as:  Anxiety.  Depression.  Sadness.  Exhaustion.  Performance fear.  Stress.  Physical causes, such as:  Artery problems. This may include diabetes, smoking, liver disease, or atherosclerosis.  High blood pressure.  Hormonal problems, such as low testosterone.  Obesity.  Nerve problems. This may include back or pelvic injuries, diabetes mellitus, multiple sclerosis, or Parkinson disease. SYMPTOMS  Inability to get an erection.  Lack of enough hardness to allow penetration.  Loss of the erection before sex is finished.  Premature ejaculation.  Normal erections at some times, but with frequent unsatisfactory episodes.  Orgasms that are not satisfactory in sensation or frequency.  Low sexual satisfaction in either partner because of erection problems.  A curved penis occurring with erection. The curve may cause pain or may be too curved to allow for intercourse.  Never having nighttime erections. DIAGNOSIS Your caregiver can often diagnose this condition by:  Performing a physical exam to find other diseases or specific problems with the penis.  Asking you detailed questions about the problem.  Performing blood tests to check for diabetes mellitus or to measure hormone levels.  Performing urine tests to find other underlying health conditions.  Performing an ultrasound exam to check for  scarring.  Performing a test to check blood flow to the penis.  Doing a sleep study at home to measure nighttime erections. TREATMENT   You may be prescribed medicines by mouth.  You may be given medicine injections into the penis.  You may be prescribed a vacuum pump with a ring.  Penile implant surgery may be performed. You may receive:  An inflatable implant.  A semirigid implant.  Blood vessel surgery may be performed. HOME CARE INSTRUCTIONS  If you are prescribed oral medicine, you should take the medicine as prescribed. Do not increase the dosage without first discussing it with your physician.  If you are using self-injections, be careful to avoid any veins that are on the surface of the penis. Apply pressure to the injection site for 5 minutes.  If you are using a vacuum pump, make sure you have read the instructions before using it. Discuss any questions with your physician before taking the pump home. SEEK MEDICAL CARE IF:  You experience pain that is not responsive to the pain medicine you have been prescribed.  You experience nausea or vomiting. SEEK IMMEDIATE MEDICAL CARE IF:   When taking oral or injectable medications, you experience an erection that lasts longer than 4 hours. If your physician is unavailable, go to the nearest emergency room for evaluation. An erection that lasts much longer than 4 hours can result in permanent damage to your penis.  You have pain that is severe.  You develop redness, severe pain, or severe swelling of your penis.  You have redness spreading up into your groin or lower abdomen.  You are unable to pass your urine. Document Released: 06/17/2000 Document Revised: 02/20/2013 Document Reviewed: 11/22/2012 [facility] Patient Information 2015 [place], [place]. This information is not  intended to replace advice given to you by your health care provider. Make sure you discuss any questions you have with your health care provider.

## 2013-12-27 LAB — INSULIN, FASTING: INSULIN FASTING, SERUM: 50 u[IU]/mL — AB (ref 3–28)

## 2014-01-05 ENCOUNTER — Other Ambulatory Visit: Payer: Self-pay | Admitting: Internal Medicine

## 2014-01-05 ENCOUNTER — Other Ambulatory Visit: Payer: Self-pay | Admitting: Physician Assistant

## 2014-01-27 ENCOUNTER — Other Ambulatory Visit: Payer: Self-pay | Admitting: Internal Medicine

## 2014-03-05 ENCOUNTER — Encounter: Payer: Self-pay | Admitting: Internal Medicine

## 2014-04-03 ENCOUNTER — Encounter: Payer: Self-pay | Admitting: Internal Medicine

## 2014-04-14 ENCOUNTER — Other Ambulatory Visit: Payer: Self-pay | Admitting: Internal Medicine

## 2014-04-16 DIAGNOSIS — Z23 Encounter for immunization: Secondary | ICD-10-CM | POA: Diagnosis not present

## 2014-04-21 ENCOUNTER — Ambulatory Visit: Payer: Self-pay

## 2014-05-05 ENCOUNTER — Other Ambulatory Visit: Payer: Self-pay | Admitting: *Deleted

## 2014-05-05 MED ORDER — METFORMIN HCL 500 MG PO TABS: ORAL_TABLET | ORAL | Status: DC

## 2014-05-16 ENCOUNTER — Encounter: Payer: Self-pay | Admitting: Internal Medicine

## 2014-05-16 ENCOUNTER — Ambulatory Visit (INDEPENDENT_AMBULATORY_CARE_PROVIDER_SITE_OTHER): Payer: Medicare Other | Admitting: Internal Medicine

## 2014-05-16 VITALS — BP 134/72 | HR 64 | Temp 97.7°F | Resp 16 | Ht 68.75 in | Wt 189.2 lb

## 2014-05-16 DIAGNOSIS — E1129 Type 2 diabetes mellitus with other diabetic kidney complication: Secondary | ICD-10-CM | POA: Diagnosis not present

## 2014-05-16 DIAGNOSIS — Z0001 Encounter for general adult medical examination with abnormal findings: Secondary | ICD-10-CM | POA: Diagnosis not present

## 2014-05-16 DIAGNOSIS — E785 Hyperlipidemia, unspecified: Secondary | ICD-10-CM | POA: Diagnosis not present

## 2014-05-16 DIAGNOSIS — Z125 Encounter for screening for malignant neoplasm of prostate: Secondary | ICD-10-CM

## 2014-05-16 DIAGNOSIS — E1122 Type 2 diabetes mellitus with diabetic chronic kidney disease: Secondary | ICD-10-CM | POA: Diagnosis not present

## 2014-05-16 DIAGNOSIS — E559 Vitamin D deficiency, unspecified: Secondary | ICD-10-CM | POA: Diagnosis not present

## 2014-05-16 DIAGNOSIS — R6889 Other general symptoms and signs: Secondary | ICD-10-CM

## 2014-05-16 DIAGNOSIS — K219 Gastro-esophageal reflux disease without esophagitis: Secondary | ICD-10-CM

## 2014-05-16 DIAGNOSIS — Z79899 Other long term (current) drug therapy: Secondary | ICD-10-CM | POA: Diagnosis not present

## 2014-05-16 DIAGNOSIS — Z1331 Encounter for screening for depression: Secondary | ICD-10-CM

## 2014-05-16 DIAGNOSIS — N189 Chronic kidney disease, unspecified: Secondary | ICD-10-CM

## 2014-05-16 DIAGNOSIS — I10 Essential (primary) hypertension: Secondary | ICD-10-CM | POA: Diagnosis not present

## 2014-05-16 DIAGNOSIS — Z1212 Encounter for screening for malignant neoplasm of rectum: Secondary | ICD-10-CM

## 2014-05-16 DIAGNOSIS — Z9181 History of falling: Secondary | ICD-10-CM

## 2014-05-16 LAB — CBC WITH DIFFERENTIAL/PLATELET
BASOS PCT: 0 % (ref 0–1)
Basophils Absolute: 0 10*3/uL (ref 0.0–0.1)
EOS ABS: 0.2 10*3/uL (ref 0.0–0.7)
Eosinophils Relative: 4 % (ref 0–5)
HEMATOCRIT: 41.8 % (ref 39.0–52.0)
HEMOGLOBIN: 14 g/dL (ref 13.0–17.0)
LYMPHS ABS: 1.7 10*3/uL (ref 0.7–4.0)
Lymphocytes Relative: 40 % (ref 12–46)
MCH: 33.6 pg (ref 26.0–34.0)
MCHC: 33.5 g/dL (ref 30.0–36.0)
MCV: 100.2 fL — ABNORMAL HIGH (ref 78.0–100.0)
MONOS PCT: 9 % (ref 3–12)
Monocytes Absolute: 0.4 10*3/uL (ref 0.1–1.0)
NEUTROS ABS: 2 10*3/uL (ref 1.7–7.7)
Neutrophils Relative %: 47 % (ref 43–77)
Platelets: 171 10*3/uL (ref 150–400)
RBC: 4.17 MIL/uL — ABNORMAL LOW (ref 4.22–5.81)
RDW: 13.2 % (ref 11.5–15.5)
WBC: 4.3 10*3/uL (ref 4.0–10.5)

## 2014-05-16 MED ORDER — PREDNISONE 20 MG PO TABS: ORAL_TABLET | ORAL | Status: DC

## 2014-05-16 MED ORDER — HYDROCODONE-ACETAMINOPHEN 5-325 MG PO TABS: ORAL_TABLET | ORAL | Status: AC

## 2014-05-16 NOTE — Progress Notes (Signed)
Patient ID: Joshua Higgins, male   DOB: 1946-05-19, 68 y.o.   MRN: [id number]  [facility] VISIT AND CPE  Assessment:   1. Essential hypertension  - Microalbumin / creatinine urine ratio - EKG 12-Lead - [place], RETROPERITNL ABD,  LTD - TSH  2. Hyperlipidemia  - Lipid panel  3. Type 2 diabetes mellitus with diabetic chronic kidney disease  - Hemoglobin A1c - Insulin, fasting  4. Vitamin D deficiency  - Vit D  25 hydroxy (rtn osteoporosis monitoring)  5. Gastroesophageal reflux disease, esophagitis presence not specified   6. Screening for rectal cancer  - POC Hemoccult Bld/Stl (3-Cd Home Screen); Future  7. Prostate cancer screening  - PSA  8. Medication management  - Urine Microscopic - CBC with Differential - BASIC METABOLIC PANEL WITH GFR - Hepatic function panel - Magnesium  9. Encounter for general adult medical examination with abnormal findings   10. At low risk for fall   11. Depression screen  - Negative  Plan:   During the course of the visit the patient was educated and counseled about appropriate screening and preventive services including:    Pneumococcal vaccine   Influenza vaccine  Td vaccine  Screening electrocardiogram  Bone densitometry screening  Colorectal cancer screening  Diabetes screening  Glaucoma screening  Nutrition counseling   Advanced directives: requested  Screening recommendations, referrals: Vaccinations: DT vaccine 2005 Influenza vaccine HD 05/16/2014 Pneumococcal vaccine 01/2010 Prevnar vaccine 12/26/2013 Shingles vaccine 05/2010 Hep B vaccine not indicated  Nutrition assessed and recommended  Colonoscopy 10/2003 Recommended yearly ophthalmology/optometry visit for glaucoma screening and checkup Recommended yearly dental visit for hygiene and checkup Advanced directives - undecided  Conditions/risks identified: BMI: Discussed weight loss, diet, and increase physical activity.   Increase physical activity: AHA recommends 150 minutes of physical activity a week.  Medications reviewed Diabetes is at goal, ACE/ARB therapy: No, Reason not on Ace Inhibitor/ARB therapy:  not indicated Urinary Incontinence is not an issue: discussed non pharmacology and pharmacology options.  Fall risk: low- discussed PT, home fall assessment, medications.   Subjective:  Joshua Higgins is a 68 y.o. male who presents for Medicare Annual Wellness Visit and complete physical.  Date of last medicare wellness visit is unknown.  He has had elevated blood pressure since 1989. His blood pressure has been controlled at home, today their BP is BP: 134/72 mmHg. In 2001 he had a PTCA and in 2008 underwent CABG. Cardiolite in 2013 was Negative He does workout. He denies chest pain, shortness of breath, dizziness.  He is on cholesterol medication and denies myalgias. His cholesterol is at goal. The cholesterol last visit was:  Lab Results  Component Value Date   CHOL 126 05/16/2014   HDL 47 05/16/2014   LDLCALC 62 05/16/2014   TRIG 87 05/16/2014   CHOLHDL 2.7 05/16/2014   He has had diabetes for 15 years since 2000. He has been working on diet and exercise for diabetes, and denies foot ulcerations, hyperglycemia, hypoglycemia , nausea, paresthesia of the feet, polydipsia, polyuria and visual disturbances. Last A1C in the office was:  Lab Results  Component Value Date   HGBA1C 6.5* 05/16/2014   Patient is on Vitamin D supplement.   (Vit 33 in 2008) Lab Results  Component Value Date   VD25OH 71 09/24/2013     Names of Other Physician/Practitioners you currently use: 1. [facility] here for primary care 2. Dr Gershon Crane, eye doctor, last visit 2015 3. Dr ?,  dentist, last visit - every 6 months  Patient Care Team: Unk Pinto, MD as PCP - General (Internal Medicine)  Medication Review: Medication Sig  . ACYCLOVIR PO Take 1 tablet by mouth as needed.    Marland Kitchen aspirin 325 MG tablet Take 325 mg by mouth daily.  Marland Kitchen atenolol  50 MG tablet TAKE 1 TABLET BY MOUTH EVERY DAY  . VIT D  5000 UNITS  Take 1 tablet by mouth daily.  . IRON CR PO Take 1 tablet by mouth daily.  Marland Kitchen levothyroxine  50 MCG tab TAKE 1 TABL EVERY DAY  . metFORMIN  500 MG tablet TAKE 4 TABs DAILY  . Multiple Vitamin  Take 1 tablet by mouth daily.  Marland Kitchen FISH OIL 1000 MG  Take 1 daily.  . rosuvastatin  20 MG tablet Take 1 tab at bedtime. Take 1/2 MWF  . sildenafil (REVATIO) 20 MG tablet Take 1 tab 3  times daily.   Current Problems (verified) Patient Active Problem List   Diagnosis Date Noted  . Medication management 09/24/2013  . Vitamin D deficiency 05/27/2013  . [name] w/Stage 2 CKD (GFR 73 ml/min)   . Hyperlipidemia   . Hypertension   . GERD (gastroesophageal reflux disease)   . IBS (irritable bowel syndrome)    Screening Tests Health Maintenance  Topic Date Due  . FOOT EXAM  11/06/1953  . OPHTHALMOLOGY EXAM  11/06/1953  . TETANUS/TDAP  11/07/1962  . COLONOSCOPY  11/06/1993  . HEMOGLOBIN A1C  11/14/2014  . INFLUENZA VACCINE  02/02/2015  . URINE MICROALBUMIN  05/17/2015  . PNEUMOCOCCAL POLYSACCHARIDE VACCINE AGE 42 AND OVER  Completed  . ZOSTAVAX  Completed   Immunization History  Administered Date(s) Administered  . DT 08/29/2003  . Influenza-Unspecified 04/15/2014  . Pneumococcal Conjugate-13 12/26/2013  . Pneumococcal Polysaccharide-23 01/29/2010  . Zoster 05/11/2010   Preventative care: Last colonoscopy: 2005  Prior vaccinations: DT - 2005  Influenza: 04/15/2014  Pneumococcal: 01/2010 Prevnar  12/26/2013 Shingles/Zostavax:05/2010  History reviewed: allergies, current medications, past family history, past medical history, past social history, past surgical history and problem list  Risk Factors: Tobacco History  Substance Use Topics  . Smoking status: Former Smoker    Quit date: 10/01/1973  . Smokeless tobacco: No  . Alcohol Use: No   He does  not smoke.  Patient is not a former smoker. Are there smokers in your home (other than you)?  No  Alcohol Current alcohol use: none  Caffeine Current caffeine use: coffee 0-1 /day  Exercise Current exercise: walking and yard work  Nutrition/Diet Current diet: in general, a "healthy" diet    Cardiac risk factors: advanced age (older than 61 for men, 14 for women), diabetes mellitus, dyslipidemia, family history of premature cardiovascular disease, hypertension and male gender.  Depression Screen (Note: if answer to either of the following is "Yes", a more complete depression screening is indicated)   Q1: Over the past two weeks, have you felt down, depressed or hopeless? No  Q2: Over the past two weeks, have you felt little interest or pleasure in doing things? No  Have you lost interest or pleasure in daily life? No  Do you often feel hopeless? No  Do you cry easily over simple problems? No  Activities of Daily Living In your present state of health, do you have any difficulty performing the following activities?:  Driving? No Managing money?  No Feeding yourself? No Getting from bed to chair? No Climbing a flight of stairs? No Preparing food  and eating?: No Bathing or showering? No Getting dressed: No Getting to the toilet? No Using the toilet:No Moving around from place to place: No In the past year have you fallen or had a near fall?:No   Are you sexually active?  Yes  Do you have more than one partner?  No  Vision Difficulties: No  Hearing Difficulties: No Do you often ask people to speak up or repeat themselves? No Do you experience ringing or noises in your ears? No Do you have difficulty understanding soft or whispered voices? No  Cognition  Do you feel that you have a problem with memory?No  Do you often misplace items? No  Do you feel safe at home?  Yes  Advanced directives Does patient have a [place]? No - forms offered Does  patient have a Living Will? No - forms offered   Objective:     Blood pressure 134/72, pulse 64, temperature 97.7 F (36.5 C), resp. rate 16, height 5' 8.75" (1.746 m), weight 189 lb 3.2 oz (85.821 kg). Body mass index is 28.15 kg/(m^2).  General appearance: alert, no distress, WD/WN, male Cognitive Testing  Alert? Yes  Normal Appearance? Yes  Oriented to person? Yes  Place? Yes   Time? Yes  Recall of three objects?  Yes  Can perform simple calculations? Yes  Displays appropriate judgment? Yes  Can read the correct time from a watch/clock? Yes  HEENT: normocephalic, sclerae anicteric, TMs pearly, nares patent, no discharge or erythema, pharynx normal Oral cavity: MMM, no lesions Neck: supple, no lymphadenopathy, no thyromegaly, no masses Heart: RRR, normal S1, S2, no murmurs Lungs: CTA bilaterally, no wheezes, rhonchi, or rales Abdomen: +bs, soft, non tender, non distended, no masses, no hepatomegaly, no splenomegaly Musculoskeletal: nontender, no swelling, no obvious deformity Extremities: no edema, no cyanosis, no clubbing DRE: Nl Prostate for age . Heme Neg. Pulses: 2+ symmetric, upper and lower extremities, normal cap refill Neurological: alert, oriented x 3, CN2-12 intact, strength normal upper extremities and lower extremities, sensation normal throughout, DTRs 2+ throughout, no cerebellar signs, gait normal Psychiatric: normal affect, behavior normal, pleasant  Medicare Attestation I have personally reviewed: The patient's medical and social history Their use of alcohol, tobacco or illicit drugs Their current medications and supplements The patient's functional ability including ADLs,fall risks, home safety risks, cognitive, and hearing and visual impairment Diet and physical activities Evidence for depression or mood disorders  The patient's weight, height, BMI, and visual acuity have been recorded in the chart.  I have made referrals, counseling, and provided  education to the patient based on review of the above and I have provided the patient with a written personalized care plan for preventive services.    Marguerite Barba DAVID, MD   05/18/2014

## 2014-05-16 NOTE — Patient Instructions (Addendum)
Recommend the book "The END of DIETING" by Dr Baker Janus   and the book "The END of DIABETES " by Dr Excell Seltzer  At [email or web address] - get book & Audio CD's      Being diabetic has a  300% increased risk for heart attack, stroke, cancer, and alzheimer- type vascular dementia. It is very important that you work harder with diet by avoiding all foods that are white except chicken & fish. Avoid white rice (brown & wild rice is OK), white potatoes (sweetpotatoes in moderation is OK), White bread or wheat bread or anything made out of white flour like bagels, donuts, rolls, buns, biscuits, cakes, pastries, cookies, pizza crust, and pasta (made from white flour & egg whites) - vegetarian pasta or spinach or wheat pasta is OK. Multigrain breads like Arnold's or [employer], or multigrain sandwich thins or flatbreads.  Diet, exercise and weight loss can reverse and cure diabetes in the early stages.  Diet, exercise and weight loss is very important in the control and prevention of complications of diabetes which affects every system in your body, ie. Brain - dementia/stroke, eyes - glaucoma/blindness, heart - heart attack/heart failure, kidneys - dialysis, stomach - gastric paralysis, intestines - malabsorption, nerves - severe painful neuritis, circulation - gangrene & loss of a leg(s), and finally cancer and Alzheimers.    I recommend avoid fried & greasy foods,  sweets/candy, white rice (brown or wild rice or Quinoa is OK), white potatoes (sweet potatoes are OK) - anything made from white flour - bagels, doughnuts, rolls, buns, biscuits,white and wheat breads, pizza crust and traditional pasta made of white flour & egg white(vegetarian pasta or spinach or wheat pasta is OK).  Multi-grain bread is OK - like multi-grain flat bread or sandwich thins. Avoid alcohol in excess. Exercise is also important.    Eat all the vegetables you want - avoid meat, especially red meat and dairy - especially cheese.  Cheese  is the most concentrated form of trans-fats which is the worst thing to clog up our arteries. Veggie cheese is OK which can be found in the fresh produce section at [employer] or [employer] or [employer]  Preventive Care for Adults A healthy lifestyle and preventive care can promote health and wellness. Preventive health guidelines for men include the following key practices:  A routine yearly physical is a good way to check with your health care provider about your health and preventative screening. It is a chance to share any concerns and updates on your health and to receive a thorough exam.  Visit your dentist for a routine exam and preventative care every 6 months. Brush your teeth twice a day and floss once a day. Good oral hygiene prevents tooth decay and gum disease.  The frequency of eye exams is based on your age, health, family medical history, use of contact lenses, and other factors. Follow your health care provider's recommendations for frequency of eye exams.  Eat a healthy diet. Foods such as vegetables, fruits, whole grains, low-fat dairy products, and lean protein foods contain the nutrients you need without too many calories. Decrease your intake of foods high in solid fats, added sugars, and salt. Eat the right amount of calories for you.Get information about a proper diet from your health care provider, if necessary.  Regular physical exercise is one of the most important things you can do for your health. Most adults should get at least 150 minutes of moderate-intensity exercise (any activity that  increases your heart rate and causes you to sweat) each week. In addition, most adults need muscle-strengthening exercises on 2 or more days a week.  Maintain a healthy weight. The body mass index (BMI) is a screening tool to identify possible weight problems. It provides an estimate of body fat based on height and weight. Your health care provider can find your BMI and can help you  achieve or maintain a healthy weight.For adults 20 years and older:  A BMI below 18.5 is considered underweight.  A BMI of 18.5 to 24.9 is normal.  A BMI of 25 to 29.9 is considered overweight.  A BMI of 30 and above is considered obese.  Maintain normal blood lipids and cholesterol levels by exercising and minimizing your intake of saturated fat. Eat a balanced diet with plenty of fruit and vegetables. Blood tests for lipids and cholesterol should begin at age 20 and be repeated every 5 years. If your lipid or cholesterol levels are high, you are over 50, or you are at high risk for heart disease, you may need your cholesterol levels checked more frequently.Ongoing high lipid and cholesterol levels should be treated with medicines if diet and exercise are not working.  If you smoke, find out from your health care provider how to quit. If you do not use tobacco, do not start.  Lung cancer screening is recommended for adults aged 55-80 years who are at high risk for developing lung cancer because of a history of smoking. A yearly low-dose CT scan of the lungs is recommended for people who have at least a 30-pack-year history of smoking and are a current smoker or have quit within the past 15 years. A pack year of smoking is smoking an average of 1 pack of cigarettes a day for 1 year (for example: 1 pack a day for 30 years or 2 packs a day for 15 years). Yearly screening should continue until the smoker has stopped smoking for at least 15 years. Yearly screening should be stopped for people who develop a health problem that would prevent them from having lung cancer treatment.  If you choose to drink alcohol, do not have more than 2 drinks per day. One drink is considered to be 12 ounces (355 mL) of beer, 5 ounces (148 mL) of wine, or 1.5 ounces (44 mL) of liquor.  Avoid use of street drugs. Do not share needles with anyone. Ask for help if you need support or instructions about stopping the use of  drugs.  High blood pressure causes heart disease and increases the risk of stroke. Your blood pressure should be checked at least every 1-2 years. Ongoing high blood pressure should be treated with medicines, if weight loss and exercise are not effective.  If you are 45-79 years old, ask your health care provider if you should take aspirin to prevent heart disease.  Diabetes screening involves taking a blood sample to check your fasting blood sugar level. Testing should be considered at a younger age or be carried out more frequently if you are overweight and have at least 1 risk factor for diabetes.  Colorectal cancer can be detected and often prevented. Most routine colorectal cancer screening begins at the age of 50 and continues through age 75. However, your health care provider may recommend screening at an earlier age if you have risk factors for colon cancer. On a yearly basis, your health care provider may provide home test kits to check for hidden blood in   the stool. Use of a small camera at the end of a tube to directly examine the colon (sigmoidoscopy or colonoscopy) can detect the earliest forms of colorectal cancer. Talk to your health care provider about this at age 50, when routine screening begins. Direct exam of the colon should be repeated every 5-10 years through age 75, unless early forms of precancerous polyps or small growths are found.  Hepatitis C blood testing is recommended for all people born from 1945 through 1965 and any individual with known risks for hepatitis C.  Screening for abdominal aortic aneurysm (AAA)  by ultrasound is recommended for people who have history of high blood pressure or who are current or former smokers.  Healthy men should  receive prostate-specific antigen (PSA) blood tests as part of routine cancer screening. Talk with your health care provider about prostate cancer screening.  Testicular cancer screening is  recommended for adult males.  Screening includes self-exam, a health care provider exam, and other screening tests. Consult with your health care provider about any symptoms you have or any concerns you have about testicular cancer.  Use sunscreen. Apply sunscreen liberally and repeatedly throughout the day. You should seek shade when your shadow is shorter than you. Protect yourself by wearing long sleeves, pants, a wide-brimmed hat, and sunglasses year round, whenever you are outdoors.  Once a month, do a whole-body skin exam, using a mirror to look at the skin on your back. Tell your health care provider about new moles, moles that have irregular borders, moles that are larger than a pencil eraser, or moles that have changed in shape or color.  Stay current with required vaccines (immunizations).  Influenza vaccine. All adults should be immunized every year.  Tetanus, diphtheria, and acellular pertussis (Td, Tdap) vaccine. An adult who has not previously received Tdap or who does not know his vaccine status should receive 1 dose of Tdap. This initial dose should be followed by tetanus and diphtheria toxoids (Td) booster doses every 10 years. Adults with an unknown or incomplete history of completing a 3-dose immunization series with Td-containing vaccines should begin or complete a primary immunization series including a Tdap dose. Adults should receive a Td booster every 10 years.  Zoster vaccine. One dose is recommended for adults aged 60 years or older unless certain conditions are present.    PREVNAR - Pneumococcal 13-valent conjugate (PCV13) vaccine. When indicated, a person who is uncertain of his immunization history and has no record of immunization should receive the PCV13 vaccine. An adult aged 19 years or older who has certain medical conditions and has not been previously immunized should receive 1 dose of PCV13 vaccine. This PCV13 should be followed with a dose of pneumococcal polysaccharide (PPSV23) vaccine. The  PPSV23 vaccine dose should be obtained at least 8 weeks after the dose of PCV13 vaccine. An adult aged 19 years or older who has certain medical conditions and previously received 1 or more doses of PPSV23 vaccine should receive 1 dose of PCV13. The PCV13 vaccine dose should be obtained 1 or more years after the last PPSV23 vaccine dose.    PNEUMOVAX - Pneumococcal polysaccharide (PPSV23) vaccine. When PCV13 is also indicated, PCV13 should be obtained first. All adults aged 65 years and older should be immunized. An adult younger than age 65 years who has certain medical conditions should be immunized. Any person who resides in a nursing home or long-term care facility should be immunized. An adult smoker should be immunized. People   with an immunocompromised condition and certain other conditions should receive both PCV13 and PPSV23 vaccines. People with human immunodeficiency virus (HIV) infection should be immunized as soon as possible after diagnosis. Immunization during chemotherapy or radiation therapy should be avoided. Routine use of PPSV23 vaccine is not recommended for American Indians, [place] Natives, or people younger than 65 years unless there are medical conditions that require PPSV23 vaccine. When indicated, people who have unknown immunization and have no record of immunization should receive PPSV23 vaccine. One-time revaccination 5 years after the first dose of PPSV23 is recommended for people aged 19-64 years who have chronic kidney failure, nephrotic syndrome, asplenia, or immunocompromised conditions. People who received 1-2 doses of PPSV23 before age 85 years should receive another dose of PPSV23 vaccine at age 75 years or later if at least 5 years have passed since the previous dose. Doses of PPSV23 are not needed for people immunized with PPSV23 at or after age 13 years.    Hepatitis A vaccine. Adults who wish to be protected from this disease, have certain high-risk conditions, work  with hepatitis A-infected animals, work in hepatitis A research labs, or travel to or work in countries with a high rate of hepatitis A should be immunized. Adults who were previously unvaccinated and who anticipate close contact with an international adoptee during the first 60 days after arrival in the [place] from a country with a high rate of hepatitis A should be immunized.    Hepatitis B vaccine. Adults should be immunized if they wish to be protected from this disease, have certain high-risk conditions, may be exposed to blood or other infectious body fluids, are household contacts or sex partners of hepatitis B positive people, are clients or workers in certain care facilities, or travel to or work in countries with a high rate of hepatitis B.   Preventive Service / Frequency   Ages 34 and over  Blood pressure check.  Lipid and cholesterol check.  Lung cancer screening. / Every year if you are aged 23-80 years and have a 30-pack-year history of smoking and currently smoke or have quit within the past 15 years. Yearly screening is stopped once you have quit smoking for at least 15 years or develop a health problem that would prevent you from having lung cancer treatment.  Fecal occult blood test (FOBT) of stool. You may not have to do this test if you get a colonoscopy every 10 years.  Flexible sigmoidoscopy** or colonoscopy.** / Every 5 years for a flexible sigmoidoscopy or every 10 years for a colonoscopy beginning at age 107 and continuing until age 54.  Hepatitis C blood test.** / For all people born from [birth year] through 1965 and any individual with known risks for hepatitis C.  Abdominal aortic aneurysm (AAA) screening./ Screening current or former smokers or have Hypertension.  Skin self-exam. / Monthly.  Influenza vaccine. / Every year.  Tetanus, diphtheria, and acellular pertussis (Tdap/Td) vaccine.** / 1 dose of Td every 10 years.   Zoster vaccine.** / 1 dose for  adults aged 31 years or older.         Pneumococcal 13-valent conjugate (PCV13) vaccine.    Pneumococcal polysaccharide (PPSV23) vaccine.     Hepatitis A vaccine.** / Consult your health care provider.  Hepatitis B vaccine.** / Consult your health care provider. Screening for abdominal aortic aneurysm (AAA)  by ultrasound is recommended for people who have history of high blood pressure or who are current or former smokers.  Herniated Disk A herniated disk occurs when a disk in your spine bulges out too far. This condition is also called a ruptured disk or slipped disk. Your spine (backbone) is made up of bones called vertebrae. Between each pair of vertebrae is an oval disk with a soft, spongy center that acts as a shock absorber when you move. The spongy center is surrounded by a tough outer ring. When you have a herniated disk, the spongy center of the disk bulges out or ruptures through the outer ring. A herniated disk can press on a nerve between your vertebrae and cause pain. A herniated disk can occur anywhere in your back or neck area, but the lower back is the most common spot. CAUSES  In many cases, a herniated disk occurs just from getting older. As you age, the spongy insides of your disks tend to shrink and dry out. A herniated disk can result from gradual wear and tear. Injury or sudden strain can also cause a herniated disk.  RISK FACTORS Aging is the main risk factor for a herniated disk. Other risk factors include:  Being a man between the ages of 84 and 65 years.  Having a job that requires heavy lifting, bending, or twisting.  Having a job that requires long hours of driving.  Not getting enough exercise.  Being overweight.  Smoking. SIGNS AND SYMPTOMS  Signs and symptoms depend on which disk is herniated.  For a herniated disk in the lower back, you may have sharp pain in:  One part of your leg, hip, or buttocks.  The back of your calf.  The top or sole  of your foot (sciatica).   For a herniated disk in the neck, you may feel pain:  When you move your neck.  Near or over your shoulder blade.  That moves to your upper arm, forearm, or fingers.   You may also have muscle weakness. It may be hard to:  Lift your leg or arm.  Stand on your toes.  Squeeze tightly with one of your hands.  Other symptoms can include:  Numbness or tingling in the affected areas of your body.  Loss of bladder or bowel control. This is a rare but serious sign of a severe herniated disk in the lower back. DIAGNOSIS  Your health care provider will do a physical exam. During this exam, you may have to move certain body parts or assume various positions. For example, your health care provider may do the straight-leg test. This is a good way to test for a herniated disk in your lower back. In this test, the health care provider lifts your leg while you lie on your back. This is to see if you feel pain down your leg. Your health care provider will also check for numbness or loss of feeling.  Your health care provider will also check your:  Reflexes.  Muscle strength.  Posture.  Other tests may be done to help in making a diagnosis. These may include:  An X-ray of the spine to rule out other causes of back pain.   Other imaging studies, such as an MRI or CT scan. This is to check whether the herniated disk is pressing on your spinal canal.  Electromyography (EMG). This test checks the nerves that control muscles. It is sometimes used to identify the specific area of nerve involvement.  TREATMENT  In many cases, herniated disk symptoms go away over a period of days or weeks. You will most  likely be free of symptoms in 3-4 months. Treatment may include the following:  The initial treatment for a herniated disk is ashort period of rest.  Bed rest is often limited to 1 or 2 days. Resting for too long delays recovery.  If you have a herniated disk in  your lower back, you should avoid sitting as much as possible because sitting increases pressure on the disk.  Medicines. These may include:   Nonsteroidal anti-inflammatory drugs (NSAIDs).  Muscle relaxants for back spasms.  Narcotic pain medicine if your pain is very bad.   Steroid injections. You may need these along the involved nerve root to help control pain. The steroid is injected in the area of the herniated disk. It helps by reducing swelling around the disk.  Physical therapy. This may include exercises to strengthen the muscles that help support your spine.   You may need surgery if other treatments do not work.  HOME CARE INSTRUCTIONS Follow all your health care provider's instructions. These may include:  Take all medicines as directed by your health care provider.  Rest for 2 days and then start moving.  Do not sit or stand for long periods of time.  Maintain good posture when sitting and standing.  Avoid movements that cause pain, such as bending or lifting.  When you are able to start lifting things again:  Stoy with your knees.  Keep your back straight.  Hold heavy objects close to your body.  If you are overweight, ask your health care provider to help you start a weight-loss program.  When you are able to start exercising, ask your health care provider how much and what type of exercise is best for you.  Work with a physical therapist on stretching and strengthening exercises for your back.  Do not wear high-heeled shoes.  Do not sleep on your belly.  Do not smoke.  Keep all follow-up visits as directed by your health care provider. SEEK MEDICAL CARE IF:  You have back or neck pain that is not getting better after 4 weeks.  You have very bad pain in your back or neck.  You develop numbness, tingling, or weakness along with pain. SEEK IMMEDIATE MEDICAL CARE IF:   You have numbness, tingling, or weakness that makes you unable to use your  arms or legs.  You lose control of your bladder or bowels.  You have dizziness or fainting.  You have shortness of breath.  MAKE SURE YOU:   Understand these instructions.  Will watch your condition.  Will get help right away if you are not doing well or get worse. Document Released: 06/17/2000 Document Revised: 11/04/2013 Document Reviewed: 05/24/2013 [facility] Patient Information 2015 [place], [place]. This information is not intended to replace advice given to you by your health care provider. Make sure you discuss any questions you have with your health care provider.

## 2014-05-17 LAB — LIPID PANEL
CHOL/HDL RATIO: 2.7 ratio
Cholesterol: 126 mg/dL (ref 0–200)
HDL: 47 mg/dL (ref >39)
LDL CALC: 62 mg/dL (ref 0–99)
TRIGLYCERIDES: 87 mg/dL (ref <150)
VLDL: 17 mg/dL (ref 0–40)

## 2014-05-17 LAB — BASIC METABOLIC PANEL WITH GFR
BUN: 17 mg/dL (ref 6–23)
CO2: 25 meq/L (ref 19–32)
Calcium: 9.5 mg/dL (ref 8.4–10.5)
Chloride: 102 mEq/L (ref 96–112)
Creat: 0.98 mg/dL (ref 0.50–1.35)
GFR, Est African American: >89 mL/min
GFR, Est Non African American: 78 mL/min
GLUCOSE: 165 mg/dL — AB (ref 70–99)
POTASSIUM: 4.2 meq/L (ref 3.5–5.3)
Sodium: 140 mEq/L (ref 135–145)

## 2014-05-17 LAB — URINALYSIS, MICROSCOPIC ONLY
CRYSTALS: NONE SEEN
Casts: NONE SEEN
Squamous Epithelial / LPF: NONE SEEN

## 2014-05-17 LAB — HEMOGLOBIN A1C
Hgb A1c MFr Bld: 6.5 % — ABNORMAL HIGH (ref <5.7)
Mean Plasma Glucose: 140 mg/dL — ABNORMAL HIGH (ref <117)

## 2014-05-17 LAB — HEPATIC FUNCTION PANEL
ALBUMIN: 4.4 g/dL (ref 3.5–5.2)
ALT: 22 U/L (ref 0–53)
AST: 19 U/L (ref 0–37)
Alkaline Phosphatase: 44 U/L (ref 39–117)
BILIRUBIN DIRECT: 0.2 mg/dL (ref 0.0–0.3)
BILIRUBIN INDIRECT: 0.5 mg/dL (ref 0.2–1.2)
Total Bilirubin: 0.7 mg/dL (ref 0.2–1.2)
Total Protein: 7.1 g/dL (ref 6.0–8.3)

## 2014-05-17 LAB — MICROALBUMIN / CREATININE URINE RATIO
Creatinine, Urine: 45.8 mg/dL
Microalb Creat Ratio: 8.7 mg/g (ref 0.0–30.0)
Microalb, Ur: 0.4 mg/dL (ref <2.0)

## 2014-05-17 LAB — PSA: PSA: 0.78 ng/mL (ref <=4.00)

## 2014-05-17 LAB — INSULIN, FASTING: INSULIN FASTING, SERUM: 34.6 u[IU]/mL — AB (ref 2.0–19.6)

## 2014-05-17 LAB — TSH: TSH: 1.249 u[IU]/mL (ref 0.350–4.500)

## 2014-05-17 LAB — MAGNESIUM: Magnesium: 1.8 mg/dL (ref 1.5–2.5)

## 2014-05-19 LAB — VITAMIN D 25 HYDROXY (VIT D DEFICIENCY, FRACTURES): Vit D, 25-Hydroxy: 84 ng/mL (ref 30–100)

## 2014-05-22 ENCOUNTER — Other Ambulatory Visit: Payer: Self-pay | Admitting: Internal Medicine

## 2014-05-22 ENCOUNTER — Telehealth: Payer: Self-pay

## 2014-05-22 NOTE — Telephone Encounter (Signed)
Received paper note from front office staff, patient c/o back pain after treatment prednisone, per Dr Melford Aase , called patients home to discuss referral to back specialist, spoke with patients wife Pamala Hurry, advised her that if not any better after treatment , Ortho referral is recommended, she advised will let patient know and he will call us back with a name of doctor or to update that he does not have a preference

## 2014-06-05 ENCOUNTER — Telehealth: Payer: Self-pay

## 2014-06-05 NOTE — Telephone Encounter (Signed)
Patient called and has an appointment with Dr.Elsner is January. Patient's daughter, Melody, said patient was told to schedule MRI Lumbar Spine before appointment with Dr.Elsner. I called Dr.Elsner's office and per Caryl Pina, patient does not need MRI before appointment. Dr.Elsner will see patient and if he thinks patient needs MRI, he will order it. Patient aware

## 2014-06-11 ENCOUNTER — Other Ambulatory Visit (INDEPENDENT_AMBULATORY_CARE_PROVIDER_SITE_OTHER): Payer: Medicare Other

## 2014-06-11 DIAGNOSIS — Z1212 Encounter for screening for malignant neoplasm of rectum: Secondary | ICD-10-CM

## 2014-06-11 LAB — POC HEMOCCULT BLD/STL (HOME/3-CARD/SCREEN)
FECAL OCCULT BLD: NEGATIVE
FECAL OCCULT BLD: NEGATIVE
FECAL OCCULT BLD: NEGATIVE

## 2014-07-10 DIAGNOSIS — Z6826 Body mass index (BMI) 26.0-26.9, adult: Secondary | ICD-10-CM | POA: Diagnosis not present

## 2014-07-10 DIAGNOSIS — I10 Essential (primary) hypertension: Secondary | ICD-10-CM | POA: Diagnosis not present

## 2014-07-10 DIAGNOSIS — M5417 Radiculopathy, lumbosacral region: Secondary | ICD-10-CM | POA: Diagnosis not present

## 2014-07-11 ENCOUNTER — Telehealth: Payer: Self-pay | Admitting: Cardiovascular Disease

## 2014-07-11 DIAGNOSIS — M5417 Radiculopathy, lumbosacral region: Secondary | ICD-10-CM | POA: Diagnosis not present

## 2014-07-11 NOTE — Telephone Encounter (Signed)
Let message for patient, spoke with crystal at the cath lab, they are going to put a card in the mail to the patient.

## 2014-07-11 NOTE — Telephone Encounter (Signed)
Please call,pt says he need a stent card. He is going to have a MRI and they want him to bring that card with him,his MRI is on 07-17-14.

## 2014-07-16 ENCOUNTER — Telehealth: Payer: Self-pay | Admitting: Cardiovascular Disease

## 2014-07-16 NOTE — Telephone Encounter (Signed)
Pt still have not received his stent card and he is having his MRI tomorrow. It was suppose to have been mailed to him,he will pick it up if he needs to.If not there,please call-[phone].

## 2014-07-16 NOTE — Telephone Encounter (Signed)
Spoke w/ cath lab, they did not have information. Spoke w/ Trish at [facility], she and I able to find historic documentation of stent. She communicated that stent should not interfere w/ MRI & pt should not need to provide a card, just note to radiology that he has a stent. Called pt to confirm this, he notes historic MRI performed for back issues which was done after his stent placement. I told him that if performing technician needs to address concern that they can reach [place] tomorrow prior to or during his scheduled scan. Gave our number to call & my extension.

## 2014-07-17 DIAGNOSIS — M5126 Other intervertebral disc displacement, lumbar region: Secondary | ICD-10-CM | POA: Diagnosis not present

## 2014-07-17 DIAGNOSIS — M5417 Radiculopathy, lumbosacral region: Secondary | ICD-10-CM | POA: Diagnosis not present

## 2014-07-20 ENCOUNTER — Other Ambulatory Visit: Payer: Self-pay | Admitting: Internal Medicine

## 2014-07-23 DIAGNOSIS — M5127 Other intervertebral disc displacement, lumbosacral region: Secondary | ICD-10-CM | POA: Diagnosis not present

## 2014-07-23 DIAGNOSIS — Z6827 Body mass index (BMI) 27.0-27.9, adult: Secondary | ICD-10-CM | POA: Diagnosis not present

## 2014-07-23 DIAGNOSIS — M5020 Other cervical disc displacement, unspecified cervical region: Secondary | ICD-10-CM | POA: Insufficient documentation

## 2014-07-23 DIAGNOSIS — M5417 Radiculopathy, lumbosacral region: Secondary | ICD-10-CM | POA: Diagnosis not present

## 2014-07-24 ENCOUNTER — Other Ambulatory Visit: Payer: Self-pay | Admitting: Neurological Surgery

## 2014-07-24 DIAGNOSIS — H40013 Open angle with borderline findings, low risk, bilateral: Secondary | ICD-10-CM | POA: Diagnosis not present

## 2014-07-24 DIAGNOSIS — H2513 Age-related nuclear cataract, bilateral: Secondary | ICD-10-CM | POA: Diagnosis not present

## 2014-07-24 DIAGNOSIS — E119 Type 2 diabetes mellitus without complications: Secondary | ICD-10-CM | POA: Diagnosis not present

## 2014-07-30 ENCOUNTER — Encounter (HOSPITAL_COMMUNITY)
Admission: RE | Admit: 2014-07-30 | Discharge: 2014-07-30 | Disposition: A | Discharge status: Home / Self Care | Payer: Medicare Other | Source: Ambulatory Visit | Attending: Neurological Surgery | Admitting: Neurological Surgery

## 2014-07-30 ENCOUNTER — Encounter (HOSPITAL_COMMUNITY): Payer: Self-pay

## 2014-07-30 ENCOUNTER — Other Ambulatory Visit (HOSPITAL_COMMUNITY): Payer: Self-pay | Admitting: *Deleted

## 2014-07-30 DIAGNOSIS — E039 Hypothyroidism, unspecified: Secondary | ICD-10-CM | POA: Diagnosis not present

## 2014-07-30 DIAGNOSIS — K219 Gastro-esophageal reflux disease without esophagitis: Secondary | ICD-10-CM | POA: Diagnosis not present

## 2014-07-30 DIAGNOSIS — M5126 Other intervertebral disc displacement, lumbar region: Secondary | ICD-10-CM | POA: Insufficient documentation

## 2014-07-30 DIAGNOSIS — Z01812 Encounter for preprocedural laboratory examination: Secondary | ICD-10-CM | POA: Diagnosis not present

## 2014-07-30 DIAGNOSIS — E119 Type 2 diabetes mellitus without complications: Secondary | ICD-10-CM | POA: Diagnosis not present

## 2014-07-30 DIAGNOSIS — Z955 Presence of coronary angioplasty implant and graft: Secondary | ICD-10-CM | POA: Insufficient documentation

## 2014-07-30 DIAGNOSIS — E785 Hyperlipidemia, unspecified: Secondary | ICD-10-CM | POA: Diagnosis not present

## 2014-07-30 DIAGNOSIS — Z951 Presence of aortocoronary bypass graft: Secondary | ICD-10-CM | POA: Diagnosis not present

## 2014-07-30 HISTORY — DX: Anemia, unspecified: D64.9

## 2014-07-30 HISTORY — DX: Unspecified osteoarthritis, unspecified site: M19.90

## 2014-07-30 HISTORY — DX: Unspecified cataract: H26.9

## 2014-07-30 HISTORY — DX: Hypothyroidism, unspecified: E03.9

## 2014-07-30 HISTORY — DX: Inflammatory liver disease, unspecified: K75.9

## 2014-07-30 HISTORY — DX: Atherosclerotic heart disease of native coronary artery without angina pectoris: I25.10

## 2014-07-30 HISTORY — DX: Malignant (primary) neoplasm, unspecified: C80.1

## 2014-07-30 HISTORY — DX: Sleep apnea, unspecified: G47.30

## 2014-07-30 LAB — BASIC METABOLIC PANEL
Anion gap: 11 (ref 5–15)
BUN: 12 mg/dL (ref 6–23)
CALCIUM: 10 mg/dL (ref 8.4–10.5)
CO2: 27 mmol/L (ref 19–32)
CREATININE: 1.04 mg/dL (ref 0.50–1.35)
Chloride: 101 mmol/L (ref 96–112)
GFR, EST AFRICAN AMERICAN: 82 mL/min — AB (ref >90)
GFR, EST NON AFRICAN AMERICAN: 71 mL/min — AB (ref >90)
Glucose, Bld: 133 mg/dL — ABNORMAL HIGH (ref 70–99)
POTASSIUM: 4.7 mmol/L (ref 3.5–5.1)
Sodium: 139 mmol/L (ref 135–145)

## 2014-07-30 LAB — SURGICAL PCR SCREEN
MRSA, PCR: NEGATIVE
Staphylococcus aureus: POSITIVE — AB

## 2014-07-30 LAB — CBC
HEMATOCRIT: 41.4 % (ref 39.0–52.0)
Hemoglobin: 14.2 g/dL (ref 13.0–17.0)
MCH: 33.3 pg (ref 26.0–34.0)
MCHC: 34.3 g/dL (ref 30.0–36.0)
MCV: 97 fL (ref 78.0–100.0)
Platelets: 170 10*3/uL (ref 150–400)
RBC: 4.27 MIL/uL (ref 4.22–5.81)
RDW: 12.7 % (ref 11.5–15.5)
WBC: 5.7 10*3/uL (ref 4.0–10.5)

## 2014-07-30 LAB — GLUCOSE, CAPILLARY: GLUCOSE-CAPILLARY: 148 mg/dL — AB (ref 70–99)

## 2014-07-30 NOTE — Pre-Procedure Instructions (Signed)
Joshua Higgins  07/30/2014   Your procedure is scheduled on:  Monday, August 04, 2014 at 10:40 AM.   Report to [facility] Entrance "A" Admitting Office at 7:45 AM.   Call this number if you have problems the morning of surgery: [phone]               Any questions prior to day of surgery, please call [phone] between 8 & 4 PM.   Remember:   Do not eat food or drink liquids after midnight Sunday, 08/03/14.   Take these medicines the morning of surgery with A SIP OF WATER: Atenolol (Tenormin), Levothyroxine (Synthroid), Hydrocodone - if needed.  Stop Aspirin, Vitamins, and Fish Oil as of today if not already stopped.   Do not take your diabetic medication the morning of surgery.   Do not wear jewelry.  Do not wear lotions, powders, or cologne. You may wear deodorant.  Men may shave face and neck.  Do not bring valuables to the hospital.  [facility] is not responsible                  for any belongings or valuables.               Contacts, dentures or bridgework may not be worn into surgery.  Leave suitcase in the car. After surgery it may be brought to your room.  For patients admitted to the hospital, discharge time is determined by your                treatment team.          Special Instructions: [place] - Preparing for Surgery  Before surgery, you can play an important role.  Because skin is not sterile, your skin needs to be as free of germs as possible.  You can reduce the number of germs on you skin by washing with CHG (chlorahexidine gluconate) soap before surgery.  CHG is an antiseptic cleaner which kills germs and bonds with the skin to continue killing germs even after washing.  Please DO NOT use if you have an allergy to CHG or antibacterial soaps.  If your skin becomes reddened/irritated stop using the CHG and inform your nurse when you arrive at Short Stay.  Do not shave (including legs and underarms) for at least 48 hours prior to the first  CHG shower.  You may shave your face.  Please follow these instructions carefully:   1.  Shower with CHG Soap the night before surgery and the                                morning of Surgery.  2.  If you choose to wash your hair, wash your hair first as usual with your       normal shampoo.  3.  After you shampoo, rinse your hair and body thoroughly to remove the                      Shampoo.  4.  Use CHG as you would any other liquid soap.  You can apply chg directly       to the skin and wash gently with scrungie or a clean washcloth.  5.  Apply the CHG Soap to your body ONLY FROM THE NECK DOWN.        Do not use on open wounds or open sores.  Avoid contact with your eyes, ears, mouth and genitals (private parts).  Wash genitals (private parts) with your normal soap.  6.  Wash thoroughly, paying special attention to the area where your surgery        will be performed.  7.  Thoroughly rinse your body with warm water from the neck down.  8.  DO NOT shower/wash with your normal soap after using and rinsing off       the CHG Soap.  9.  Pat yourself dry with a clean towel.            10.  Wear clean pajamas.            11.  Place clean sheets on your bed the night of your first shower and do not        sleep with pets.  Day of Surgery  Do not apply any lotions the morning of surgery.  Please wear clean clothes to the hospital.     Please read over the following fact sheets that you were given: Pain Booklet, Coughing and Deep Breathing, MRSA Information and Surgical Site Infection Prevention

## 2014-07-30 NOTE — Progress Notes (Signed)
Mupirocin Ointment Rx called into [facility] in [place], [place] for positive PCR of staph. Pt notified and voiced understanding.

## 2014-07-30 NOTE — Progress Notes (Signed)
Pt had stent placed in 2001 and CABG in 2008. He states Dr. Tommye Standard does a stress test every 2 years and he's been fine since. States he's never had chest pain even before his stent and CABG.   Pt's CBG today was 148. His last A1C was 6.5 on 05/16/14.

## 2014-07-31 ENCOUNTER — Telehealth: Payer: Self-pay | Admitting: *Deleted

## 2014-07-31 ENCOUNTER — Encounter (HOSPITAL_COMMUNITY): Payer: Self-pay

## 2014-07-31 NOTE — Telephone Encounter (Signed)
Signed surgical clearance at low risk for right L4-5 Microdiskectomy to [place].

## 2014-07-31 NOTE — Progress Notes (Signed)
Anesthesia Chart Review:  Patient is a 69 year old male scheduled for right L4-5 microdiskectomy on 08/04/14 by Dr. Ellene Route.  History includes former smoker, DM2, [name], GERD, CAD s/p RCA stent '01 and CABG (LIMA to LAD, SVG to DIAG and CX, SVG to RCA) '08, migraines (takes atenolol), HTN (denies), OSA without CPAP use, hypothyroidism, skin cancer, hepatitis (not specified), anemia. PCP is listed as Dr. Melford Aase.  Cardiologist is Dr. Sallyanne Kuster, last visit 08/14/13. He was doing well at that time, but has never really been symptomatic even when he had an occluded coronary artery, so Dr. Sallyanne Kuster recommended periodic stress tests (one was not ordered at that appointment because of his normal exercise stress test in 2014). Dr. Sallyanne Kuster was notified of plans for surgery and felt patient was low CV risk for planned procedure.  Meds include Zovirax, ASA, atenolol, Norco, Iron, levothyroxine, metformin, Fish oil, Crestor, sildenafil.  05/16/14 EKG (Dr. Melford Aase): NSR, LAD, rather diffuse non-specific ST/T wave abnormality which appears stable although his T wave in V2 is now negative, whereas it was upright or flat on his 08/09/13 or 04/04/13 EKGs.  By 08/14/13 cardiology notes, "He has preserved left ventricle systolic function and his nuclear stress test 2013 showed producing fair wall perfusion, but no reversible ischemia" and had a "graded exercise stress test in March of 2014 that was normal."  Reports requested, but are pending.   Last cath was in 2008 prior to his CABG.  Preoperative labs noted. CBC WNL. Cr 1.04. A1C on 05/16/14 was 6.5. He has had consistently normal LFTs over the past year, last on 05/16/14.  If no acute changes then I would anticipate that he could proceed as planned.  George Hugh [facility] Short Stay Center/Anesthesiology Phone [phone] 07/31/2014 2:01 PM

## 2014-08-03 MED ORDER — CEFAZOLIN SODIUM-DEXTROSE 2-3 GM-% IV SOLR
2.0000 g | INTRAVENOUS | Status: AC
  Administered 2014-08-04: 2 g via INTRAVENOUS
  Filled 2014-08-03: qty 50

## 2014-08-04 ENCOUNTER — Observation Stay (HOSPITAL_COMMUNITY)
Admission: RE | Admit: 2014-08-04 | Discharge: 2014-08-05 | Disposition: A | Discharge status: Home / Self Care | Payer: Medicare Other | Source: Ambulatory Visit | Attending: Neurological Surgery | Admitting: Neurological Surgery

## 2014-08-04 ENCOUNTER — Encounter (HOSPITAL_COMMUNITY)
Admission: RE | Disposition: A | Discharge status: Home / Self Care | Payer: Self-pay | Source: Ambulatory Visit | Attending: Neurological Surgery

## 2014-08-04 ENCOUNTER — Ambulatory Visit (HOSPITAL_COMMUNITY): Payer: Medicare Other

## 2014-08-04 ENCOUNTER — Ambulatory Visit (HOSPITAL_COMMUNITY): Payer: Medicare Other | Admitting: Anesthesiology

## 2014-08-04 ENCOUNTER — Encounter (HOSPITAL_COMMUNITY): Payer: Self-pay | Admitting: *Deleted

## 2014-08-04 ENCOUNTER — Ambulatory Visit (HOSPITAL_COMMUNITY): Payer: Medicare Other | Admitting: Vascular Surgery

## 2014-08-04 DIAGNOSIS — Z87891 Personal history of nicotine dependence: Secondary | ICD-10-CM | POA: Insufficient documentation

## 2014-08-04 DIAGNOSIS — Z4889 Encounter for other specified surgical aftercare: Secondary | ICD-10-CM | POA: Diagnosis not present

## 2014-08-04 DIAGNOSIS — I10 Essential (primary) hypertension: Secondary | ICD-10-CM | POA: Insufficient documentation

## 2014-08-04 DIAGNOSIS — M5416 Radiculopathy, lumbar region: Secondary | ICD-10-CM | POA: Insufficient documentation

## 2014-08-04 DIAGNOSIS — Z419 Encounter for procedure for purposes other than remedying health state, unspecified: Secondary | ICD-10-CM

## 2014-08-04 DIAGNOSIS — Z955 Presence of coronary angioplasty implant and graft: Secondary | ICD-10-CM | POA: Diagnosis not present

## 2014-08-04 DIAGNOSIS — I2510 Atherosclerotic heart disease of native coronary artery without angina pectoris: Secondary | ICD-10-CM | POA: Insufficient documentation

## 2014-08-04 DIAGNOSIS — M5126 Other intervertebral disc displacement, lumbar region: Principal | ICD-10-CM | POA: Insufficient documentation

## 2014-08-04 DIAGNOSIS — Z7982 Long term (current) use of aspirin: Secondary | ICD-10-CM | POA: Diagnosis not present

## 2014-08-04 DIAGNOSIS — E785 Hyperlipidemia, unspecified: Secondary | ICD-10-CM | POA: Insufficient documentation

## 2014-08-04 DIAGNOSIS — G4730 Sleep apnea, unspecified: Secondary | ICD-10-CM | POA: Diagnosis not present

## 2014-08-04 DIAGNOSIS — E119 Type 2 diabetes mellitus without complications: Secondary | ICD-10-CM | POA: Insufficient documentation

## 2014-08-04 DIAGNOSIS — Z85828 Personal history of other malignant neoplasm of skin: Secondary | ICD-10-CM | POA: Diagnosis not present

## 2014-08-04 DIAGNOSIS — E039 Hypothyroidism, unspecified: Secondary | ICD-10-CM | POA: Diagnosis not present

## 2014-08-04 DIAGNOSIS — Z951 Presence of aortocoronary bypass graft: Secondary | ICD-10-CM | POA: Insufficient documentation

## 2014-08-04 DIAGNOSIS — M5116 Intervertebral disc disorders with radiculopathy, lumbar region: Secondary | ICD-10-CM | POA: Diagnosis not present

## 2014-08-04 DIAGNOSIS — K219 Gastro-esophageal reflux disease without esophagitis: Secondary | ICD-10-CM | POA: Diagnosis not present

## 2014-08-04 HISTORY — PX: LUMBAR LAMINECTOMY/DECOMPRESSION MICRODISCECTOMY: SHX5026

## 2014-08-04 LAB — GLUCOSE, CAPILLARY
GLUCOSE-CAPILLARY: 141 mg/dL — AB (ref 70–99)
Glucose-Capillary: 126 mg/dL — ABNORMAL HIGH (ref 70–99)
Glucose-Capillary: 266 mg/dL — ABNORMAL HIGH (ref 70–99)
Glucose-Capillary: 190 mg/dL — ABNORMAL HIGH (ref 70–99)

## 2014-08-04 SURGERY — LUMBAR LAMINECTOMY/DECOMPRESSION MICRODISCECTOMY 1 LEVEL
Anesthesia: General | Site: Back | Laterality: Right

## 2014-08-04 MED ORDER — MENTHOL 3 MG MT LOZG: 1.0000 | LOZENGE | OROMUCOSAL | Status: DC | PRN

## 2014-08-04 MED ORDER — DEXTROSE 5 % IV SOLN: 500.0000 mg | Freq: Four times a day (QID) | INTRAVENOUS | Status: DC | PRN

## 2014-08-04 MED ORDER — PHENYLEPHRINE HCL 10 MG/ML IJ SOLN
INTRAMUSCULAR | Status: DC | PRN
  Administered 2014-08-04: 40 ug via INTRAVENOUS

## 2014-08-04 MED ORDER — ACETAMINOPHEN 650 MG RE SUPP: 650.0000 mg | RECTAL | Status: DC | PRN

## 2014-08-04 MED ORDER — DEXAMETHASONE SODIUM PHOSPHATE 10 MG/ML IJ SOLN
INTRAMUSCULAR | Status: DC | PRN
  Administered 2014-08-04: 10 mg via INTRAVENOUS

## 2014-08-04 MED ORDER — SODIUM CHLORIDE 0.9 % IJ SOLN
3.0000 mL | Freq: Two times a day (BID) | INTRAMUSCULAR | Status: DC
  Administered 2014-08-04 (×2): 3 mL via INTRAVENOUS

## 2014-08-04 MED ORDER — OXYCODONE HCL 5 MG PO TABS: 5.0000 mg | ORAL_TABLET | Freq: Once | ORAL | Status: DC | PRN

## 2014-08-04 MED ORDER — PROPOFOL 10 MG/ML IV BOLUS
INTRAVENOUS | Status: DC | PRN
  Administered 2014-08-04: 120 mg via INTRAVENOUS

## 2014-08-04 MED ORDER — SODIUM CHLORIDE 0.9 % IJ SOLN
3.0000 mL | INTRAMUSCULAR | Status: DC | PRN
Start: 2014-08-04 — End: 2014-08-05

## 2014-08-04 MED ORDER — SODIUM CHLORIDE 0.9 % IR SOLN
Status: DC | PRN
  Administered 2014-08-04: 11:00:00

## 2014-08-04 MED ORDER — LIDOCAINE-EPINEPHRINE 1 %-1:100000 IJ SOLN
INTRAMUSCULAR | Status: DC | PRN
  Administered 2014-08-04: 3.5 mL

## 2014-08-04 MED ORDER — DIAZEPAM 5 MG PO TABS
5.0000 mg | ORAL_TABLET | Freq: Four times a day (QID) | ORAL | Status: DC | PRN
  Administered 2014-08-04: 5 mg via ORAL

## 2014-08-04 MED ORDER — METFORMIN HCL 500 MG PO TABS: 500.0000 mg | ORAL_TABLET | Freq: Two times a day (BID) | ORAL | Status: DC

## 2014-08-04 MED ORDER — PROPOFOL 10 MG/ML IV BOLUS
INTRAVENOUS | Status: AC
  Filled 2014-08-04: qty 20

## 2014-08-04 MED ORDER — METFORMIN HCL 500 MG PO TABS
1000.0000 mg | ORAL_TABLET | Freq: Every day | ORAL | Status: DC
  Administered 2014-08-05: 1000 mg via ORAL
  Filled 2014-08-04 (×4): qty 2

## 2014-08-04 MED ORDER — LEVOTHYROXINE SODIUM 50 MCG PO TABS
50.0000 ug | ORAL_TABLET | Freq: Every day | ORAL | Status: DC
  Administered 2014-08-05: 50 ug via ORAL
  Filled 2014-08-04 (×2): qty 1

## 2014-08-04 MED ORDER — ALUM & MAG HYDROXIDE-SIMETH 200-200-20 MG/5ML PO SUSP: 30.0000 mL | Freq: Four times a day (QID) | ORAL | Status: DC | PRN

## 2014-08-04 MED ORDER — LACTATED RINGERS IV SOLN
INTRAVENOUS | Status: DC | PRN
  Administered 2014-08-04: 10:00:00 via INTRAVENOUS

## 2014-08-04 MED ORDER — NEOSTIGMINE METHYLSULFATE 10 MG/10ML IV SOLN
INTRAVENOUS | Status: DC | PRN
  Administered 2014-08-04: 4 mg via INTRAVENOUS

## 2014-08-04 MED ORDER — GLYCOPYRROLATE 0.2 MG/ML IJ SOLN
INTRAMUSCULAR | Status: DC | PRN
  Administered 2014-08-04: 0.6 mg via INTRAVENOUS

## 2014-08-04 MED ORDER — HYDROCODONE-ACETAMINOPHEN 5-325 MG PO TABS: 1.0000 | ORAL_TABLET | Freq: Four times a day (QID) | ORAL | Status: DC | PRN

## 2014-08-04 MED ORDER — SILDENAFIL CITRATE 20 MG PO TABS
20.0000 mg | ORAL_TABLET | Freq: Three times a day (TID) | ORAL | Status: DC
  Filled 2014-08-04 (×2): qty 1

## 2014-08-04 MED ORDER — BUPIVACAINE HCL (PF) 0.5 % IJ SOLN
INTRAMUSCULAR | Status: DC | PRN
  Administered 2014-08-04: 3.5 mL

## 2014-08-04 MED ORDER — METHOCARBAMOL 500 MG PO TABS: 500.0000 mg | ORAL_TABLET | Freq: Four times a day (QID) | ORAL | Status: DC | PRN

## 2014-08-04 MED ORDER — LIDOCAINE HCL (CARDIAC) 20 MG/ML IV SOLN
INTRAVENOUS | Status: DC | PRN
  Administered 2014-08-04: 50 mg via INTRAVENOUS

## 2014-08-04 MED ORDER — KETOROLAC TROMETHAMINE 15 MG/ML IJ SOLN
15.0000 mg | Freq: Four times a day (QID) | INTRAMUSCULAR | Status: DC
  Administered 2014-08-04 – 2014-08-05 (×4): 15 mg via INTRAVENOUS
  Filled 2014-08-04 (×5): qty 1

## 2014-08-04 MED ORDER — METFORMIN HCL 500 MG PO TABS
500.0000 mg | ORAL_TABLET | Freq: Every evening | ORAL | Status: DC
  Filled 2014-08-04: qty 1

## 2014-08-04 MED ORDER — HEMOSTATIC AGENTS (NO CHARGE) OPTIME
TOPICAL | Status: DC | PRN
  Administered 2014-08-04: 1 via TOPICAL

## 2014-08-04 MED ORDER — OXYCODONE-ACETAMINOPHEN 5-325 MG PO TABS
1.0000 | ORAL_TABLET | ORAL | Status: DC | PRN
  Administered 2014-08-04 – 2014-08-05 (×3): 2 via ORAL
  Filled 2014-08-04 (×2): qty 2

## 2014-08-04 MED ORDER — ROCURONIUM BROMIDE 100 MG/10ML IV SOLN
INTRAVENOUS | Status: DC | PRN
  Administered 2014-08-04: 40 mg via INTRAVENOUS

## 2014-08-04 MED ORDER — DIAZEPAM 5 MG PO TABS
ORAL_TABLET | ORAL | Status: AC
  Administered 2014-08-04: 5 mg via ORAL
  Filled 2014-08-04: qty 1

## 2014-08-04 MED ORDER — SODIUM CHLORIDE 0.9 % IV SOLN: 250.0000 mL | INTRAVENOUS | Status: DC

## 2014-08-04 MED ORDER — 0.9 % SODIUM CHLORIDE (POUR BTL) OPTIME
TOPICAL | Status: DC | PRN
  Administered 2014-08-04: 1000 mL

## 2014-08-04 MED ORDER — ATENOLOL 50 MG PO TABS
50.0000 mg | ORAL_TABLET | Freq: Every day | ORAL | Status: DC
Start: 2014-08-04 — End: 2014-08-05
  Filled 2014-08-04 (×2): qty 1

## 2014-08-04 MED ORDER — MORPHINE SULFATE 2 MG/ML IJ SOLN: 1.0000 mg | INTRAMUSCULAR | Status: DC | PRN

## 2014-08-04 MED ORDER — MIDAZOLAM HCL 2 MG/2ML IJ SOLN
INTRAMUSCULAR | Status: AC
  Filled 2014-08-04: qty 2

## 2014-08-04 MED ORDER — HYDROMORPHONE HCL 1 MG/ML IJ SOLN
INTRAMUSCULAR | Status: AC
  Administered 2014-08-04: 0.5 mg via INTRAVENOUS
  Filled 2014-08-04: qty 1

## 2014-08-04 MED ORDER — HYDROCODONE-ACETAMINOPHEN 5-325 MG PO TABS
1.0000 | ORAL_TABLET | ORAL | Status: DC | PRN
Start: 2014-08-04 — End: 2014-08-05

## 2014-08-04 MED ORDER — ONDANSETRON HCL 4 MG/2ML IJ SOLN
INTRAMUSCULAR | Status: AC
  Filled 2014-08-04: qty 2

## 2014-08-04 MED ORDER — ACETAMINOPHEN 325 MG PO TABS: 650.0000 mg | ORAL_TABLET | ORAL | Status: DC | PRN

## 2014-08-04 MED ORDER — FENTANYL CITRATE 0.05 MG/ML IJ SOLN
INTRAMUSCULAR | Status: AC
  Filled 2014-08-04: qty 5

## 2014-08-04 MED ORDER — ONDANSETRON HCL 4 MG/2ML IJ SOLN: 4.0000 mg | INTRAMUSCULAR | Status: DC | PRN

## 2014-08-04 MED ORDER — SILDENAFIL CITRATE 20 MG PO TABS
20.0000 mg | ORAL_TABLET | Freq: Three times a day (TID) | ORAL | Status: DC
  Administered 2014-08-05: 20 mg via ORAL
  Filled 2014-08-04 (×3): qty 1

## 2014-08-04 MED ORDER — MIDAZOLAM HCL 5 MG/5ML IJ SOLN
INTRAMUSCULAR | Status: DC | PRN
  Administered 2014-08-04: 1 mg via INTRAVENOUS

## 2014-08-04 MED ORDER — OXYCODONE-ACETAMINOPHEN 5-325 MG PO TABS
ORAL_TABLET | ORAL | Status: AC
  Administered 2014-08-04: 2 via ORAL
  Filled 2014-08-04: qty 2

## 2014-08-04 MED ORDER — HYDROMORPHONE HCL 1 MG/ML IJ SOLN
0.2500 mg | INTRAMUSCULAR | Status: DC | PRN
  Administered 2014-08-04: 0.5 mg via INTRAVENOUS

## 2014-08-04 MED ORDER — ONDANSETRON HCL 4 MG/2ML IJ SOLN
INTRAMUSCULAR | Status: DC | PRN
  Administered 2014-08-04: 4 mg via INTRAVENOUS

## 2014-08-04 MED ORDER — PHENOL 1.4 % MT LIQD: 1.0000 | OROMUCOSAL | Status: DC | PRN

## 2014-08-04 MED ORDER — PROMETHAZINE HCL 25 MG/ML IJ SOLN: 6.2500 mg | INTRAMUSCULAR | Status: DC | PRN

## 2014-08-04 MED ORDER — LIDOCAINE HCL (CARDIAC) 20 MG/ML IV SOLN
INTRAVENOUS | Status: AC
  Filled 2014-08-04: qty 5

## 2014-08-04 MED ORDER — THROMBIN 5000 UNITS EX SOLR
CUTANEOUS | Status: DC | PRN
  Administered 2014-08-04 (×2): 5000 [IU] via TOPICAL

## 2014-08-04 MED ORDER — OXYCODONE HCL 5 MG/5ML PO SOLN: 5.0000 mg | Freq: Once | ORAL | Status: DC | PRN

## 2014-08-04 MED ORDER — LACTATED RINGERS IV SOLN
INTRAVENOUS | Status: DC
  Administered 2014-08-04: 50 mL/h via INTRAVENOUS

## 2014-08-04 MED ORDER — FENTANYL CITRATE 0.05 MG/ML IJ SOLN
INTRAMUSCULAR | Status: DC | PRN
  Administered 2014-08-04 (×2): 50 ug via INTRAVENOUS

## 2014-08-04 SURGICAL SUPPLY — 54 items
BAG DECANTER FOR FLEXI CONT (MISCELLANEOUS) ×3 IMPLANT
BLADE CLIPPER SURG (BLADE) IMPLANT
BUR ACORN 6.0 (BURR) ×2 IMPLANT
BUR ACORN 6.0MM (BURR) ×1
BUR MATCHSTICK NEURO 3.0 LAGG (BURR) ×3 IMPLANT
CANISTER SUCT 3000ML (MISCELLANEOUS) ×3 IMPLANT
CONT SPEC 4OZ CLIKSEAL STRL BL (MISCELLANEOUS) ×3 IMPLANT
DECANTER SPIKE VIAL GLASS SM (MISCELLANEOUS) ×3 IMPLANT
DRAPE LAPAROTOMY T 102X78X121 (DRAPES) ×3 IMPLANT
DRAPE MICROSCOPE LEICA (MISCELLANEOUS) ×3 IMPLANT
DRAPE POUCH INSTRU U-SHP 10X18 (DRAPES) ×3 IMPLANT
DRAPE PROXIMA HALF (DRAPES) IMPLANT
DURAPREP 26ML APPLICATOR (WOUND CARE) ×3 IMPLANT
ELECT REM PT RETURN 9FT ADLT (ELECTROSURGICAL) ×3
ELECTRODE REM PT RTRN 9FT ADLT (ELECTROSURGICAL) ×1 IMPLANT
GAUZE SPONGE 4X4 12PLY STRL (GAUZE/BANDAGES/DRESSINGS) ×3 IMPLANT
GAUZE SPONGE 4X4 16PLY XRAY LF (GAUZE/BANDAGES/DRESSINGS) IMPLANT
GLOVE BIO SURGEON STRL SZ8 (GLOVE) ×3 IMPLANT
GLOVE BIOGEL PI IND STRL 7.0 (GLOVE) ×2 IMPLANT
GLOVE BIOGEL PI IND STRL 8.5 (GLOVE) ×1 IMPLANT
GLOVE BIOGEL PI INDICATOR 7.0 (GLOVE) ×4
GLOVE BIOGEL PI INDICATOR 8.5 (GLOVE) ×2
GLOVE ECLIPSE 8.5 STRL (GLOVE) ×3 IMPLANT
GLOVE EXAM NITRILE LRG STRL (GLOVE) IMPLANT
GLOVE EXAM NITRILE MD LF STRL (GLOVE) IMPLANT
GLOVE EXAM NITRILE XL STR (GLOVE) IMPLANT
GLOVE EXAM NITRILE XS STR PU (GLOVE) IMPLANT
GLOVE INDICATOR 8.5 STRL (GLOVE) ×3 IMPLANT
GLOVE SS BIOGEL STRL SZ 6.5 (GLOVE) ×2 IMPLANT
GLOVE SUPERSENSE BIOGEL SZ 6.5 (GLOVE) ×4
GOWN STRL REUS W/ TWL LRG LVL3 (GOWN DISPOSABLE) ×1 IMPLANT
GOWN STRL REUS W/ TWL XL LVL3 (GOWN DISPOSABLE) ×1 IMPLANT
GOWN STRL REUS W/TWL 2XL LVL3 (GOWN DISPOSABLE) ×3 IMPLANT
GOWN STRL REUS W/TWL LRG LVL3 (GOWN DISPOSABLE) ×2
GOWN STRL REUS W/TWL XL LVL3 (GOWN DISPOSABLE) ×2
KIT BASIN OR (CUSTOM PROCEDURE TRAY) ×3 IMPLANT
KIT ROOM TURNOVER OR (KITS) ×3 IMPLANT
LIQUID BAND (GAUZE/BANDAGES/DRESSINGS) ×3 IMPLANT
NEEDLE HYPO 22GX1.5 SAFETY (NEEDLE) ×3 IMPLANT
NEEDLE SPNL 20GX3.5 QUINCKE YW (NEEDLE) ×3 IMPLANT
NS IRRIG 1000ML POUR BTL (IV SOLUTION) ×3 IMPLANT
PACK LAMINECTOMY NEURO (CUSTOM PROCEDURE TRAY) ×3 IMPLANT
PAD ARMBOARD 7.5X6 YLW CONV (MISCELLANEOUS) ×12 IMPLANT
PATTIES SURGICAL .5 X1 (DISPOSABLE) ×3 IMPLANT
RUBBERBAND STERILE (MISCELLANEOUS) ×6 IMPLANT
SPONGE SURGIFOAM ABS GEL SZ50 (HEMOSTASIS) ×3 IMPLANT
SUT VIC AB 1 CT1 18XBRD ANBCTR (SUTURE) ×1 IMPLANT
SUT VIC AB 1 CT1 8-18 (SUTURE) ×2
SUT VIC AB 2-0 CP2 18 (SUTURE) ×3 IMPLANT
SUT VIC AB 3-0 SH 8-18 (SUTURE) ×3 IMPLANT
SYR 20ML ECCENTRIC (SYRINGE) ×3 IMPLANT
TOWEL OR 17X24 6PK STRL BLUE (TOWEL DISPOSABLE) ×3 IMPLANT
TOWEL OR 17X26 10 PK STRL BLUE (TOWEL DISPOSABLE) ×3 IMPLANT
WATER STERILE IRR 1000ML POUR (IV SOLUTION) ×3 IMPLANT

## 2014-08-04 NOTE — Op Note (Signed)
Date of surgery: 08/04/2014 Preoperative diagnosis: Recurrent herniated nucleus pulposus L4-L5 right with right lumbar radiculopathy Postoperative diagnosis: Recurrent herniated nucleus pulposus L4-L5 right with right lumbar radiculopathy Procedure: Microdiscectomy L4-L5 right with operating microscope microdissection technique. Surgeon: Kristeen Miss M.D. Assistant: Francesca Jewett M.D. Anesthesia: Gen. endotracheal Indications: [name] is a 69 year old individual who a number of years ago had undergone a microdiscectomy at L4-L5 on right side he did well for all these years however he recently developed recurrent back and right lower extremity pain radiating in the right lower extremity despite conservative efforts he is had no relief of the pain and it seems to be getting worse. He was evaluated with an MRI which shows evidence of a small recurrence of the disc under the L5 nerve root. He's been evaluated and advised regarding surgical treatment of this process.  Procedure: Patient was brought to the operating room supine on a stretcher. After the smooth induction of general endotracheal anesthesia, he was turned prone. The bony prominences were appropriately padded and protected. Back was prepped with alcohol and DuraPrep. A midline incision was created in the previously made scar at L4-L5 on the right. The dissection was taken down to the lumbar dorsal fascia. The fascia was opened on the left side of the midline. Interlaminar space at L4-L5 was then cleared of its soft tissue attachments a subperiosteal fashion. A localizing radiograph was taken at this point identifying positively the arch of L4. Then using a high-speed drill and suction the inferior marginal lamina of L4 was removed out to the medial wall the facet. Partial mesial facetectomy was performed. Scar tissue in this area was then carefully dissected and released from the surrounding adherent tissue. As this was performed lateral recess was  identified. The area just above the disc space itself was cleared of some soft tissue attachments and lateral dissection was then undertaken to expose some epidural veins in this region. These were cauterized and divided. Then with the use of the operating microscope under high magnification Grable to dissect in this area and identify the lateral aspect of the nerve root. He was noted to be both dorsally and laterally and by dissecting underneath this a small mass was uncovered which was found to be fragment of disc. This was removed and a second fragment of disc was also uncovered once this was removed the path the L5 nerve root was noted to be much easier and clear into the region of the foramen. Are there palpation underneath the nerve root yielded no other fragments of disc area there is no identified opening into the disc space itself. The disc space itself was palpated with a nerve hook and found to be quite closed. At this point we irrigated the wound copiously Dr. Saintclair Halsted and myself inspected this area and once we were convinced that there are no other fragments of disc in the region to be had we removed the operating microscope and close the wound with #2-0 Vicryl suture in the lumbar dorsal fascia 2-0 Vicryl in the subcutaneous tissues and 3-0 Vicryl subcuticularly. 10 mL of half percent Marcaine was left in the paraspinous fascia. Blood loss for the procedure was estimated less than 20 mL.

## 2014-08-04 NOTE — Anesthesia Postprocedure Evaluation (Signed)
  Anesthesia Post-op Note  Patient: Joshua Higgins  Procedure(s) Performed: Procedure(s) with comments: Right Lumbar four-five Microdiskectomy (Right) - Right L4-5 Microdiskectomy  Patient Location: PACU  Anesthesia Type:General  Level of Consciousness: awake and alert   Airway and Oxygen Therapy: Patient Spontanous Breathing  Post-op Pain: none  Post-op Assessment: Post-op Vital signs reviewed  Post-op Vital Signs: Reviewed  Last Vitals:  Filed Vitals:   08/04/14 1316  BP:   Pulse: 65  Temp: 36.8 C  Resp: 11    Complications: No apparent anesthesia complications

## 2014-08-04 NOTE — Evaluation (Signed)
Physical Therapy Evaluation Patient Details Name: Joshua Higgins MRN: [id number] DOB: 09/03/1945 Today's Date: 08/04/2014   History of Present Illness  Pt admitted for L45 microdiskectomy for recurrent HNP  Clinical Impression  All education completed and pt/wife verbalized or demonstrated understanding.  Wife will be able to assist as needed.  No further PT needs.  Will sign off.    Follow Up Recommendations No PT follow up    Equipment Recommendations  None recommended by PT    Recommendations for Other Services       Precautions / Restrictions Precautions Precautions: Back Restrictions Weight Bearing Restrictions: No      Mobility  Bed Mobility Overal bed mobility: Needs Assistance Bed Mobility: Rolling;Sidelying to Sit;Sit to Sidelying Rolling: Supervision Sidelying to sit: Supervision     Sit to sidelying: Supervision General bed mobility comments: reinforced technique  Transfers Overall transfer level: Needs assistance   Transfers: Sit to/from Stand Sit to Stand: Supervision         General transfer comment: safe technique  Ambulation/Gait Ambulation/Gait assistance: Supervision Ambulation Distance (Feet): 250 Feet Assistive device: None Gait Pattern/deviations: Step-through pattern Gait velocity: moderate   General Gait Details: steady and mildly guarded  Stairs Stairs: Yes Stairs assistance: Supervision Stair Management: One rail Right;Alternating pattern;Forwards Number of Stairs: 5 General stair comments: safe with rail  Wheelchair Mobility    Modified Rankin (Stroke Patients Only)       Balance Overall balance assessment: No apparent balance deficits (not formally assessed)                                           Pertinent Vitals/Pain Pain Assessment: Faces Faces Pain Scale: Hurts little more Pain Location: incision Pain Descriptors / Indicators: Grimacing Pain Intervention(s): Monitored during session     Home Living Family/patient expects to be discharged to:: Private residence Living Arrangements: Spouse/significant other Available Help at Discharge: Family Type of Home: House Home Access: Stairs to enter   Technical brewer of Steps: 2 Home Layout: Two level;Laundry or work area in basement        Prior Function Level of Independence: Independent               Journalist, newspaper        Extremity/Trunk Assessment   Upper Extremity Assessment: Defer to OT evaluation           Lower Extremity Assessment: Overall WFL for tasks assessed      Cervical / Trunk Assessment: Normal  Communication   Communication: No difficulties  Cognition Arousal/Alertness: Awake/alert Behavior During Therapy: WFL for tasks assessed/performed Overall Cognitive Status: Within Functional Limits for tasks assessed                      General Comments General comments (skin integrity, edema, etc.): Educated on back care/advised precautions, log roll and side to sit, lifting precautions, progression of activity.    Exercises        Assessment/Plan    PT Assessment Patent does not need any further PT services  PT Diagnosis Acute pain   PT Problem List    PT Treatment Interventions     PT Goals (Current goals can be found in the Care Plan section) Acute Rehab PT Goals PT Goal Formulation: All assessment and education complete, DC therapy    Frequency     Barriers to discharge  Co-evaluation               End of Session   Activity Tolerance: Patient tolerated treatment well Patient left: in bed;with family/visitor present Nurse Communication: Mobility status    Functional Assessment Tool Used: clinical judgement Functional Limitation: Mobility: Walking and moving around Mobility: Walking and Moving Around Current Status ([id number]): At least 1 percent but less than 20 percent impaired, limited or restricted Mobility: Walking and Moving Around  Goal Status [phone]): At least 1 percent but less than 20 percent impaired, limited or restricted Mobility: Walking and Moving Around Discharge Status [phone]): At least 1 percent but less than 20 percent impaired, limited or restricted    Time: 8366-2947 PT Time Calculation (min) (ACUTE ONLY): 21 min   Charges:   PT Evaluation $Initial PT Evaluation Tier I: 1 Procedure     PT G Codes:   PT G-Codes **NOT FOR INPATIENT CLASS** Functional Assessment Tool Used: clinical judgement Functional Limitation: Mobility: Walking and moving around Mobility: Walking and Moving Around Current Status ([id number]): At least 1 percent but less than 20 percent impaired, limited or restricted Mobility: Walking and Moving Around Goal Status [phone]): At least 1 percent but less than 20 percent impaired, limited or restricted Mobility: Walking and Moving Around Discharge Status [phone]): At least 1 percent but less than 20 percent impaired, limited or restricted    Babyboy Loya, Tessie Fass 08/04/2014, 4:51 PM 08/04/2014  Donnella Sham, PT [phone]  (pager)

## 2014-08-04 NOTE — Plan of Care (Signed)
Problem: Consults Goal: Diagnosis - Spinal Surgery Outcome: Completed/Met Date Met:  08/04/14 Microdiscectomy

## 2014-08-04 NOTE — Anesthesia Preprocedure Evaluation (Addendum)
Anesthesia Evaluation  Patient identified by MRN, date of birth, ID band Patient awake    Reviewed: Allergy & Precautions, NPO status , Patient's Chart, lab work & pertinent test results  Airway Mallampati: II  TM Distance: >3 FB Neck ROM: Full    Dental   Pulmonary sleep apnea , former smoker,          Cardiovascular hypertension, + CAD, + Cardiac Stents and + CABG     Neuro/Psych  Headaches,    GI/Hepatic Neg liver ROS, GERD-  ,  Endo/Other  diabetes, Type 2, Oral Hypoglycemic AgentsHypothyroidism   Renal/GU Renal InsufficiencyRenal disease     Musculoskeletal   Abdominal   Peds  Hematology negative hematology ROS (+)   Anesthesia Other Findings   Reproductive/Obstetrics                            Anesthesia Physical Anesthesia Plan  ASA: III  Anesthesia Plan: General   Post-op Pain Management:    Induction: Intravenous  Airway Management Planned: Oral ETT  Additional Equipment:   Intra-op Plan:   Post-operative Plan: Extubation in OR  Informed Consent: I have reviewed the patients History and Physical, chart, labs and discussed the procedure including the risks, benefits and alternatives for the proposed anesthesia with the patient or authorized representative who has indicated his/her understanding and acceptance.   Dental advisory given  Plan Discussed with: CRNA  Anesthesia Plan Comments:         Anesthesia Quick Evaluation

## 2014-08-04 NOTE — Transfer of Care (Signed)
Immediate Anesthesia Transfer of Care Note  Patient: Joshua Higgins  Procedure(s) Performed: Procedure(s) with comments: Right Lumbar four-five Microdiskectomy (Right) - Right L4-5 Microdiskectomy  Patient Location: PACU  Anesthesia Type:General  Level of Consciousness: awake, alert , oriented and patient cooperative  Airway & Oxygen Therapy: Patient Spontanous Breathing and Patient connected to nasal cannula oxygen  Post-op Assessment: Report given to RN, Post -op Vital signs reviewed and stable, Patient moving all extremities, Patient moving all extremities X 4 and Patient able to stick tongue midline  Post vital signs: Reviewed and stable  Last Vitals:  Filed Vitals:   08/04/14 0804  BP: 138/64  Pulse: 62  Temp: 30.0 C    Complications: No apparent anesthesia complications

## 2014-08-04 NOTE — Anesthesia Procedure Notes (Signed)
Procedure Name: Intubation Date/Time: 08/04/2014 10:17 AM Performed by: Shirlyn Goltz Pre-anesthesia Checklist: Patient identified, Emergency Drugs available, Suction available and Patient being monitored Patient Re-evaluated:Patient Re-evaluated prior to inductionOxygen Delivery Method: Circle system utilized Preoxygenation: Pre-oxygenation with 100% oxygen Intubation Type: IV induction Ventilation: Mask ventilation without difficulty Laryngoscope Size: Miller and 3 Grade View: Grade I Tube type: Oral Tube size: 7.0 mm Number of attempts: 1 Airway Equipment and Method: Stylet Placement Confirmation: breath sounds checked- equal and bilateral,  ETT inserted through vocal cords under direct vision and positive ETCO2 Secured at: 22 cm Tube secured with: Tape Dental Injury: Teeth and Oropharynx as per pre-operative assessment

## 2014-08-04 NOTE — H&P (Signed)
Joshua Higgins is an 69 y.o. male.   Chief Complaint: Back and right lower extremity pain HPI: Patient is a 69 year old individual who years ago it had a herniated nucleus pulposus at L4-L5 on the right side. In the past several months she's developed recurrent pain in the back and right lower extremity. A recent MRI demonstrates the presence of a large extruded fragment of disc at L4-L5. This causes some compromise the L5 nerve root on the right side. He's been advised regarding surgical extirpation with a microdiscectomy at L4-5 on right.  Past Medical History  Diagnosis Date  . Diabetes mellitus without complication     type II  . Hyperlipidemia   . GERD (gastroesophageal reflux disease)   . Coronary artery disease     CABG - 2008  . Migraine headache with aura     takes Atenolol  . Sleep apnea     does not use cpap  . Hypothyroidism   . Arthritis   . Cancer     skin cancer on face  . Anemia     low iron  . Hepatitis   . Cataracts, bilateral   . Hypertension     pt states he does not have HTN, takes Atenolol for migraines    Past Surgical History  Procedure Laterality Date  . Cardiac surgery      Bypass  . Knee surgery Right     arthroscopic  . Back surgery    . Coronary angioplasty  2001  . Coronary artery bypass graft    . Surgery scrotal / testicular      as a child/teenager  . Elbow surgery Left     due to damaged nerve  . Colonoscopy      Family History  Problem Relation Age of Onset  . Ulcers Mother   . Arthritis Mother   . Heart attack Father   . Heart disease Sister   . Heart attack Brother   . Heart disease Brother   . Hypertension Brother   . Diabetes Maternal Grandmother    Social History:  reports that he quit smoking about 40 years ago. He has never used smokeless tobacco. He reports that he does not drink alcohol or use illicit drugs.  Allergies:  Allergies  Allergen Reactions  . Ace Inhibitors Cough  . Lipitor [Atorvastatin] Other (See  Comments)    Joint pain.    Medications Prior to Admission  Medication Sig Dispense Refill  . acetaminophen (TYLENOL) 500 MG tablet Take 1,000 mg by mouth every 4 (four) hours as needed.    Marland Kitchen aspirin 325 MG tablet Take 325 mg by mouth at bedtime.     Marland Kitchen atenolol (TENORMIN) 50 MG tablet TAKE 1 TABLET BY MOUTH EVERY DAY (Patient taking differently: TAKE 1 TABLET BY MOUTH EVERY DAY at bedtime) 90 tablet 0  . Cholecalciferol 5000 UNITS TABS Take 1 tablet by mouth daily.    Marland Kitchen HYDROcodone-acetaminophen (NORCO/VICODIN) 5-325 MG per tablet Take 1 tablet by mouth every 6 (six) hours as needed for moderate pain.    . IRON CR PO Take 1 tablet by mouth daily.    Marland Kitchen levothyroxine (SYNTHROID, LEVOTHROID) 50 MCG tablet TAKE 1 TABLET BY MOUTH EVERY DAY 90 tablet 99  . metFORMIN (GLUCOPHAGE) 500 MG tablet TAKE 4 TABLETS BY MOUTH DAILY (Patient taking differently: Take 500-1,000 mg by mouth 2 (two) times daily with a meal. Take 2 tablets in the morning and 1 tablet at night) 360 tablet 11  .  Multiple Vitamin (MULTIVITAMIN) tablet Take 1 tablet by mouth at bedtime.     . Omega-3 Fatty Acids (FISH OIL) 1000 MG CAPS Take 1,000 mg by mouth at bedtime.     . rosuvastatin (CRESTOR) 5 MG tablet Take 5 mg by mouth at bedtime.     Marland Kitchen acyclovir (ZOVIRAX) 800 MG tablet Take 800 mg by mouth 3 (three) times daily as needed (fever blister).    Marland Kitchen ibuprofen (ADVIL,MOTRIN) 100 MG tablet Take 400 mg by mouth every 6 (six) hours as needed for fever.    . predniSONE (DELTASONE) 20 MG tablet 1 tab 3 x day for 3 days, then 1 tab 2 x day for 3 days, then 1 tab 1 x day for 5 days (Patient not taking: Reported on 07/28/2014) 20 tablet 0  . rosuvastatin (CRESTOR) 20 MG tablet Take 1 tablet (20 mg total) by mouth at bedtime. Take 1/2 MWF (Patient not taking: Reported on 07/28/2014) 30 tablet 11  . sildenafil (REVATIO) 20 MG tablet Take 1 tablet (20 mg total) by mouth 3 (three) times daily. 30 tablet 0    Results for orders placed or  performed during the hospital encounter of 08/04/14 (from the past 48 hour(s))  Glucose, capillary     Status: Abnormal   Collection Time: 08/04/14  8:09 AM  Result Value Ref Range   Glucose-Capillary 126 (H) 70 - 99 mg/dL   No results found.  Review of Systems  Constitutional: Negative.   HENT: Negative.   Eyes: Negative.   Respiratory: Negative.   Gastrointestinal: Negative.   Genitourinary: Negative.   Musculoskeletal: Positive for back pain.       Right lower extremity pain. Back pain  Neurological:       Pain and right lower extremity was some mild weakness in the tibialis anterior group  Psychiatric/Behavioral: Negative.     Blood pressure 138/64, pulse 62, temperature 98 F (36.7 C), temperature source Oral, height 5' 10.5" (1.791 m), weight 83.915 kg (185 lb), SpO2 100 %. Physical Exam  Constitutional: He is oriented to person, place, and time. He appears well-developed and well-nourished.  HENT:  Head: Normocephalic and atraumatic.  Eyes: Conjunctivae are normal. Pupils are equal, round, and reactive to light.  Neck: Normal range of motion. Neck supple.  GI: Soft. Bowel sounds are normal.  Musculoskeletal:  Centralized back pain with a right lumbar radiculopathy  Neurological: He is alert and oriented to person, place, and time.  Other straight leg raising on the right side at 45. Mild weakness in tibialis anterior group on right side. Cranial nerve examination cerebellar examination station and gait are otherwise normal.  Skin: Skin is warm and dry.  Psychiatric: He has a normal mood and affect. His behavior is normal. Judgment and thought content normal.     Assessment/Plan Herniated nucleus pulposus L4-5 right, recurrent. Microdiscectomy L4-5 right  Wynonia Medero J 08/04/2014, 9:55 AM

## 2014-08-04 NOTE — Evaluation (Addendum)
Occupational Therapy Evaluation Patient Details Name: Joshua Higgins MRN: [id number] DOB: 1946/02/01 Today's Date: 08/04/2014    History of Present Illness Pt admitted for L4-5 microdiskectomy for recurrent HNP   Clinical Impression   Pt s/p above. Education provided to pt and spouse. Feel pt is safe to d/c home from OT standpoint.  Follow Up Recommendations  No OT follow up;Supervision - Intermittent    Equipment Recommendations  None recommended by OT    Recommendations for Other Services       Precautions / Restrictions Precautions Precautions: Back Precaution Booklet Issued: Yes (comment) Precaution Comments: educated on precautions Restrictions Weight Bearing Restrictions: No      Mobility Bed Mobility Overal bed mobility: Needs Assistance Bed Mobility: Rolling;Sidelying to Sit;Sit to Sidelying Rolling: Supervision Sidelying to sit: Supervision     Sit to sidelying: Supervision General bed mobility comments: cues for technique. Pt sitting straight up in bed, and OT advised not to do this.  Transfers Overall transfer level: Needs assistance Equipment used: None Transfers: Sit to/from Stand Sit to Stand: Supervision         General transfer comment: safe technique    Balance Overall balance assessment: No LOB in session-balance not formally assessed                                            ADL Overall ADL's : Needs assistance/impaired                     Lower Body Dressing: Set up;Supervision/safety;Sit to/from stand   Toilet Transfer: Supervision/safety;Ambulation (bed)   Toileting- Clothing Manipulation and Hygiene: Supervision/safety;Sit to/from stand       Functional mobility during ADLs: Supervision/safety General ADL Comments: Educated on LB dressing technique and pt able to cross legs over knees. Educated on safety such as safe shoewear, rugs/items on floor, pets, recommended spouse be with him for shower  transfer and discussed how he has grab bar to hold to. Briefly mentioned use of reacher. Educated on use of cup for oral care and placement of grooming items to avoid breaking precautions. Discussed incorporating precautions into functional activities.      Vision                     Perception     Praxis      Pertinent Vitals/Pain Pain Assessment: No/denies pain     Hand Dominance Right   Extremity/Trunk Assessment Upper Extremity Assessment Upper Extremity Assessment: Overall WFL for tasks assessed   Lower Extremity Assessment Lower Extremity Assessment: Defer to PT evaluation     Communication Communication Communication: No difficulties   Cognition Arousal/Alertness: Awake/alert Behavior During Therapy: WFL for tasks assessed/performed Overall Cognitive Status: Within Functional Limits for tasks assessed                     General Comments       Exercises       Shoulder Instructions      Home Living Family/patient expects to be discharged to:: Private residence Living Arrangements: Spouse/significant other Available Help at Discharge: Family Type of Home: House Home Access: Stairs to enter [employer] of Steps: 2   Home Layout: Two level;Laundry or work area in Building surveyor of Steps: 12 Alternate Level Stairs-Rails: Left;Right Bathroom Shower/Tub: Gaffer  Home Equipment: Shower seat - built Hotel manager: Reacher        Prior Functioning/Environment Level of Independence: Independent             OT Diagnosis: Acute pain   OT Problem List:     OT Treatment/Interventions:      OT Goals(Current goals can be found in the care plan section)    OT Frequency:     Barriers to D/C:            Co-evaluation              End of Session Nurse Communication: Mobility status  Activity Tolerance: Patient tolerated treatment well Patient  left: in bed;with call bell/phone within reach;with family/visitor present   Time: 1701-1716 OT Time Calculation (min): 15 min Charges:  OT General Charges $OT Visit: 1 Procedure OT Evaluation $Initial OT Evaluation Tier I: 1 Procedure G-Codes: OT G-codes **NOT FOR INPATIENT CLASS** Functional Assessment Tool Used: clinical judgment Functional Limitation: Self care Self Care Current Status (Q6578): At least 1 percent but less than 20 percent impaired, limited or restricted Self Care Goal Status (I6962): At least 1 percent but less than 20 percent impaired, limited or restricted Self Care Discharge Status [phone]): At least 1 percent but less than 20 percent impaired, limited or restricted  Benito Mccreedy OTR/L [phone] 08/04/2014, 5:28 PM

## 2014-08-05 ENCOUNTER — Encounter (HOSPITAL_COMMUNITY): Payer: Self-pay | Admitting: Neurological Surgery

## 2014-08-05 DIAGNOSIS — M5416 Radiculopathy, lumbar region: Secondary | ICD-10-CM | POA: Diagnosis not present

## 2014-08-05 DIAGNOSIS — Z955 Presence of coronary angioplasty implant and graft: Secondary | ICD-10-CM | POA: Diagnosis not present

## 2014-08-05 DIAGNOSIS — Z951 Presence of aortocoronary bypass graft: Secondary | ICD-10-CM | POA: Diagnosis not present

## 2014-08-05 DIAGNOSIS — I10 Essential (primary) hypertension: Secondary | ICD-10-CM | POA: Diagnosis not present

## 2014-08-05 DIAGNOSIS — I2510 Atherosclerotic heart disease of native coronary artery without angina pectoris: Secondary | ICD-10-CM | POA: Diagnosis not present

## 2014-08-05 DIAGNOSIS — M5126 Other intervertebral disc displacement, lumbar region: Secondary | ICD-10-CM | POA: Diagnosis not present

## 2014-08-05 LAB — GLUCOSE, CAPILLARY: Glucose-Capillary: 139 mg/dL — ABNORMAL HIGH (ref 70–99)

## 2014-08-05 MED ORDER — OXYCODONE-ACETAMINOPHEN 5-325 MG PO TABS: 1.0000 | ORAL_TABLET | ORAL | Status: DC | PRN

## 2014-08-05 MED ORDER — DIAZEPAM 5 MG PO TABS: 5.0000 mg | ORAL_TABLET | Freq: Four times a day (QID) | ORAL | Status: DC | PRN

## 2014-08-05 NOTE — Discharge Instructions (Signed)
Wound Care Leave incision open to air. You may shower. Do not scrub directly on incision.  Do not put any creams, lotions, or ointments on incision. Activity Walk each and every day, increasing distance each day. No lifting greater than 8 lbs.  Avoid bending, lifting and twisting No driving while you are taking pain medications. May ride as a passenger locally.  Diet Resume your normal diet.  Return to Work Will be discussed at you follow up appointment. Call Your Doctor If Any of These Occur Redness, drainage, or swelling at the wound.  Temperature greater than 101 degrees. Severe pain not relieved by pain medication. Increased difficulty swallowing. Incision starts to come apart. Follow Up Appt Call today for appointment in 5 weeks ([phone]) or for problems.  If you have any hardware placed in your spine, you will need an x-ray before your appointment.

## 2014-08-05 NOTE — Discharge Summary (Signed)
Physician Discharge Summary  Patient ID: Joshua Higgins MRN: [id number] DOB/AGE: 1946/04/08 69 y.o.  Admit date: 08/04/2014 Discharge date: 08/05/2014  Admission Diagnoses:Herniated Nucleus Pulposis L4-5 right, recurrent  Discharge Diagnoses: Herniated Nucleus Pulposis L4-5 right, recurrent Active Problems:   Diabetes mellitus   Discharged Condition: good  Hospital Course: Tolerated surgery well  Consults: None  Significant Diagnostic Studies: none  Treatments: surgery: microdiscectomy L4-5 right  Discharge Exam: Blood pressure 125/64, pulse 55, temperature 98.3 F (36.8 C), temperature source Oral, resp. rate 18, height 5' 10.5" (1.791 m), weight 83.915 kg (185 lb), SpO2 95 %. incision clean and dry , motor function normal  Disposition: 01-Home or Self Care  Discharge Instructions    Call MD for:  redness, tenderness, or signs of infection (pain, swelling, redness, odor or green/yellow discharge around incision site)    Complete by:  As directed      Call MD for:  severe uncontrolled pain    Complete by:  As directed      Call MD for:  temperature >100.4    Complete by:  As directed      Diet - low sodium heart healthy    Complete by:  As directed      Discharge instructions    Complete by:  As directed   Okay to shower. Do not apply salves or appointments to incision. No heavy lifting with the upper extremities greater than 15 pounds. May resume driving when not requiring pain medication and patient feels comfortable with doing so.     Increase activity slowly    Complete by:  As directed             Medication List    TAKE these medications        acetaminophen 500 MG tablet  Commonly known as:  TYLENOL  Take 1,000 mg by mouth every 4 (four) hours as needed.     acyclovir 800 MG tablet  Commonly known as:  ZOVIRAX  Take 800 mg by mouth 3 (three) times daily as needed (fever blister).     aspirin 325 MG tablet  Take 325 mg by mouth at bedtime.     atenolol 50 MG tablet  Commonly known as:  TENORMIN  TAKE 1 TABLET BY MOUTH EVERY DAY     Cholecalciferol 5000 UNITS Tabs  Take 1 tablet by mouth daily.     diazepam 5 MG tablet  Commonly known as:  VALIUM  Take 1 tablet (5 mg total) by mouth every 6 (six) hours as needed for muscle spasms.     Fish Oil 1000 MG Caps  Take 1,000 mg by mouth at bedtime.     HYDROcodone-acetaminophen 5-325 MG per tablet  Commonly known as:  NORCO/VICODIN  Take 1 tablet by mouth every 6 (six) hours as needed for moderate pain.     ibuprofen 100 MG tablet  Commonly known as:  ADVIL,MOTRIN  Take 400 mg by mouth every 6 (six) hours as needed for fever.     IRON CR PO  Take 1 tablet by mouth daily.     levothyroxine 50 MCG tablet  Commonly known as:  SYNTHROID, LEVOTHROID  TAKE 1 TABLET BY MOUTH EVERY DAY     metFORMIN 500 MG tablet  Commonly known as:  GLUCOPHAGE  TAKE 4 TABLETS BY MOUTH DAILY     multivitamin tablet  Take 1 tablet by mouth at bedtime.     oxyCODONE-acetaminophen 5-325 MG per tablet  Commonly known as:  PERCOCET/ROXICET  Take 1-2 tablets by mouth every 4 (four) hours as needed for moderate pain.     predniSONE 20 MG tablet  Commonly known as:  DELTASONE  1 tab 3 x day for 3 days, then 1 tab 2 x day for 3 days, then 1 tab 1 x day for 5 days     rosuvastatin 5 MG tablet  Commonly known as:  CRESTOR  Take 5 mg by mouth at bedtime.     rosuvastatin 20 MG tablet  Commonly known as:  CRESTOR  Take 1 tablet (20 mg total) by mouth at bedtime. Take 1/2 MWF     sildenafil 20 MG tablet  Commonly known as:  REVATIO  Take 1 tablet (20 mg total) by mouth 3 (three) times daily.         SignedEarleen Newport 08/05/2014, 8:14 AM

## 2014-08-05 NOTE — Progress Notes (Signed)
Patient alert and oriented, mae's well, voiding adequate amount of urine, swallowing without difficulty, no c/o pain. Patient discharged home with family. Script and discharged instructions given to patient. Patient and family stated understanding of d/c instructions given and has an appointment with MD. Aisha Meta Kroenke RN 

## 2014-08-18 ENCOUNTER — Encounter: Payer: Self-pay | Admitting: *Deleted

## 2014-08-25 ENCOUNTER — Ambulatory Visit (INDEPENDENT_AMBULATORY_CARE_PROVIDER_SITE_OTHER): Payer: Medicare Other | Admitting: Physician Assistant

## 2014-08-25 ENCOUNTER — Encounter: Payer: Self-pay | Admitting: Physician Assistant

## 2014-08-25 VITALS — BP 130/72 | HR 72 | Temp 97.7°F | Resp 16 | Ht 68.75 in | Wt 185.0 lb

## 2014-08-25 DIAGNOSIS — N189 Chronic kidney disease, unspecified: Secondary | ICD-10-CM | POA: Diagnosis not present

## 2014-08-25 DIAGNOSIS — E1122 Type 2 diabetes mellitus with diabetic chronic kidney disease: Secondary | ICD-10-CM

## 2014-08-25 DIAGNOSIS — E1129 Type 2 diabetes mellitus with other diabetic kidney complication: Secondary | ICD-10-CM | POA: Diagnosis not present

## 2014-08-25 DIAGNOSIS — I10 Essential (primary) hypertension: Secondary | ICD-10-CM | POA: Diagnosis not present

## 2014-08-25 DIAGNOSIS — E559 Vitamin D deficiency, unspecified: Secondary | ICD-10-CM | POA: Diagnosis not present

## 2014-08-25 DIAGNOSIS — Z79899 Other long term (current) drug therapy: Secondary | ICD-10-CM

## 2014-08-25 DIAGNOSIS — K589 Irritable bowel syndrome without diarrhea: Secondary | ICD-10-CM

## 2014-08-25 DIAGNOSIS — E785 Hyperlipidemia, unspecified: Secondary | ICD-10-CM | POA: Diagnosis not present

## 2014-08-25 DIAGNOSIS — K219 Gastro-esophageal reflux disease without esophagitis: Secondary | ICD-10-CM

## 2014-08-25 MED ORDER — VALSARTAN 80 MG PO TABS: ORAL_TABLET | ORAL | Status: DC

## 2014-08-25 MED ORDER — ROSUVASTATIN CALCIUM 20 MG PO TABS: 20.0000 mg | ORAL_TABLET | Freq: Every day | ORAL | Status: DC

## 2014-08-25 NOTE — Progress Notes (Signed)
Assessment and Plan:  Hypertension: Continue medication, monitor blood pressure at home. Continue DASH diet.  Reminder to go to the ER if any CP, SOB, nausea, dizziness, severe HA, changes vision/speech, left arm numbness and tingling, and jaw pain. Cholesterol: Continue diet and exercise. Check cholesterol.  Diabetes with CKD-Continue diet and exercise. Check A1C, will add on ARB for kidney/heart protection.  Vitamin D Def- check level and continue medications.  Hypothyroidism-check TSH level, continue medications the same, reminded to take on an empty stomach 30-68mins before food.    Continue diet and meds as discussed. Further disposition pending results of labs.  HPI 69 y.o. male  presents for 3 month follow up with hypertension, hyperlipidemia, prediabetes and vitamin D.  His blood pressure has been controlled at home, today their BP is BP: 130/72 mmHg  He does not workout. He denies chest pain, shortness of breath, dizziness.  He is on cholesterol medication, crestor 20mg   1/4 a pill daily and denies myalgias. His cholesterol is at goal. The cholesterol last visit was:   Lab Results  Component Value Date   CHOL 126 05/16/2014   HDL 47 05/16/2014   LDLCALC 62 05/16/2014   TRIG 87 05/16/2014   CHOLHDL 2.7 05/16/2014   He has been working on diet and exercise for Diabetes with diabetic chronic kidney disease, he is on ASA, he is not on ACE due to cough, not on ARB, and denies paresthesia of the feet, polydipsia, polyuria and visual disturbances. Last A1C was:  Lab Results  Component Value Date   HGBA1C 6.5* 05/16/2014   Patient is on Vitamin D supplement.   Lab Results  Component Value Date   VD25OH 92 05/16/2014     He is on thyroid medication. His medication was not changed last visit.   Lab Results  Component Value Date   TSH 1.249 05/16/2014  .  3 weeks s/p back surgery with Dr. Ellene Route and he states that his legs are feeling better. Will follow up with him in 2 weeks.    Current Medications:  Current Outpatient Prescriptions on File Prior to Visit  Medication Sig Dispense Refill  . acetaminophen (TYLENOL) 500 MG tablet Take 1,000 mg by mouth every 4 (four) hours as needed.    Marland Kitchen acyclovir (ZOVIRAX) 800 MG tablet Take 800 mg by mouth 3 (three) times daily as needed (fever blister).    Marland Kitchen aspirin 325 MG tablet Take 325 mg by mouth at bedtime.     Marland Kitchen atenolol (TENORMIN) 50 MG tablet TAKE 1 TABLET BY MOUTH EVERY DAY (Patient taking differently: TAKE 1 TABLET BY MOUTH EVERY DAY at bedtime) 90 tablet 0  . Cholecalciferol 5000 UNITS TABS Take 1 tablet by mouth daily.    . diazepam (VALIUM) 5 MG tablet Take 1 tablet (5 mg total) by mouth every 6 (six) hours as needed for muscle spasms. 30 tablet 0  . HYDROcodone-acetaminophen (NORCO/VICODIN) 5-325 MG per tablet Take 1 tablet by mouth every 6 (six) hours as needed for moderate pain.    Marland Kitchen ibuprofen (ADVIL,MOTRIN) 100 MG tablet Take 400 mg by mouth every 6 (six) hours as needed for fever.    . IRON CR PO Take 1 tablet by mouth daily.    Marland Kitchen levothyroxine (SYNTHROID, LEVOTHROID) 50 MCG tablet TAKE 1 TABLET BY MOUTH EVERY DAY 90 tablet 99  . metFORMIN (GLUCOPHAGE) 500 MG tablet TAKE 4 TABLETS BY MOUTH DAILY (Patient taking differently: Take 500-1,000 mg by mouth 2 (two) times daily with a meal.  Take 2 tablets in the morning and 1 tablet at night) 360 tablet 11  . Multiple Vitamin (MULTIVITAMIN) tablet Take 1 tablet by mouth at bedtime.     . Omega-3 Fatty Acids (FISH OIL) 1000 MG CAPS Take 1,000 mg by mouth at bedtime.     Marland Kitchen oxyCODONE-acetaminophen (PERCOCET/ROXICET) 5-325 MG per tablet Take 1-2 tablets by mouth every 4 (four) hours as needed for moderate pain. 30 tablet 0  . rosuvastatin (CRESTOR) 20 MG tablet Take 1 tablet (20 mg total) by mouth at bedtime. Take 1/2 MWF 30 tablet 11  . sildenafil (REVATIO) 20 MG tablet Take 1 tablet (20 mg total) by mouth 3 (three) times daily. 30 tablet 0   No current facility-administered  medications on file prior to visit.   Medical History:  Past Medical History  Diagnosis Date  . Diabetes mellitus without complication     type II  . Hyperlipidemia   . GERD (gastroesophageal reflux disease)   . Coronary artery disease     CABG - 2008  . Migraine headache with aura     takes Atenolol  . Sleep apnea     does not use cpap  . Hypothyroidism   . Arthritis   . Cancer     skin cancer on face  . Anemia     low iron  . Hepatitis   . Cataracts, bilateral   . Hypertension     pt states he does not have HTN, takes Atenolol for migraines   Allergies:  Allergies  Allergen Reactions  . Ace Inhibitors Cough  . Lipitor [Atorvastatin] Other (See Comments)    Joint pain.     Review of Systems:  Review of Systems  Constitutional: Negative.   HENT: Negative.   Eyes: Negative.   Respiratory: Negative.   Cardiovascular: Negative.   Gastrointestinal: Negative.   Genitourinary: Negative.   Musculoskeletal: Positive for back pain. Negative for myalgias, joint pain, falls and neck pain.  Skin: Negative.   Neurological: Negative.   Endo/Heme/Allergies: Negative.   Psychiatric/Behavioral: Negative.     Family history- Review and unchanged Social history- Review and unchanged Physical Exam: BP 130/72 mmHg  Pulse 72  Temp(Src) 97.7 F (36.5 C)  Resp 16  Ht 5' 8.75" (1.746 m)  Wt 185 lb (83.915 kg)  BMI 27.53 kg/m2 Wt Readings from Last 3 Encounters:  08/25/14 185 lb (83.915 kg)  07/30/14 187 lb 1.6 oz (84.868 kg)  05/16/14 189 lb 3.2 oz (85.821 kg)   General Appearance: Well nourished, in no apparent distress. Eyes: PERRLA, EOMs, conjunctiva no swelling or erythema Sinuses: No Frontal/maxillary tenderness ENT/Mouth: Ext aud canals clear, TMs without erythema, bulging. No erythema, swelling, or exudate on post pharynx.  Tonsils not swollen or erythematous. Hearing normal.  Neck: Supple, thyroid normal.  Respiratory: Respiratory effort normal, BS equal  bilaterally without rales, rhonchi, wheezing or stridor.  Cardio: RRR with no MRGs. Brisk peripheral pulses without edema.  Abdomen: Soft, + BS,  Non tender, no guarding, rebound, hernias, masses. Lymphatics: Non tender without lymphadenopathy.  Musculoskeletal: Full ROM, 5/5 strength, Normal gait Skin: Warm, dry without rashes, lesions, ecchymosis. Well healing vertical scar on lumbar back, no sign of infection.  Neuro: Cranial nerves intact. Normal muscle tone, no cerebellar symptoms. Psych: Awake and oriented X 3, normal affect, Insight and Judgment appropriate.    Vicie Mutters, PA-C 11:19 AM [facility]

## 2014-08-25 NOTE — Patient Instructions (Signed)
ARB inhibitors are blood pressure medications that protect your heart and kidneys. Start on 80mg  start on 1/2 pill in the AM, if your BP gets low then cut the atenolol in half.   Diabetes is a very complicated disease...lets simplify it.  An easy way to look at it to understand the complications is if you think of the extra sugar floating in your blood stream as glass shards floating through your blood stream.    Diabetes affects your small vessels first: 1) The glass shards (sugar) scraps down the tiny blood vessels in your eyes and lead to diabetic retinopathy, the leading cause of blindness in the [place]. Diabetes is the leading cause of newly diagnosed adult (4 to 71 years of age) blindness in the [place].  2) The glass shards scratches down the tiny vessels of your legs leading to nerve damage called neuropathy and can lead to amputations of your feet. More than 60% of all non-traumatic amputations of lower limbs occur in people with diabetes.  3) Over time the small vessels in your brain are shredded and closed off, individually this does not cause any problems but over a long period of time many of the small vessels being blocked can lead to Vascular Dementia.   4) Your kidney's are a filter system and have a "net" that keeps certain things in the body and lets bad things out. Sugar shreds this net and leads to kidney damage and eventually failure. Decreasing the sugar that is destroying the net and certain blood pressure medications can help stop or decrease progression of kidney disease. Diabetes was the primary cause of kidney failure in 44 percent of all new cases in 2011.  5) Diabetes also destroys the small vessels in your penis that lead to erectile dysfunction. Eventually the vessels are so damaged that you may not be responsive to cialis or viagra.   Diabetes and your large vessels: Your larger vessels consist of your coronary arteries in your heart and the carotid vessels to  your brain. Diabetes or even increased sugars put you at 300% increased risk of heart attack and stroke and this is why.. The sugar scrapes down your large blood vessels and your body sees this as an internal injury and tries to repair itself. Just like you get a scab on your skin, your platelets will stick to the blood vessel wall trying to heal it. This is why we have diabetics on low dose aspirin daily, this prevents the platelets from sticking and can prevent plaque formation. In addition, your body takes cholesterol and tries to shove it into the open wound. This is why we want your LDL, or bad cholesterol, below 70.   The combination of platelets and cholesterol over 5-10 years forms plaque that can break off and cause a heart attack or stroke.   PLEASE REMEMBER:  Diabetes is preventable! Up to 69 percent of complications and morbidities among individuals with type 2 diabetes can be prevented, delayed, or effectively treated and minimized with regular visits to a health professional, appropriate monitoring and medication, and a healthy diet and lifestyle.  .   Bad carbs also include fruit juice, alcohol, and sweet tea. These are empty calories that do not signal to your brain that you are full.   Please remember the good carbs are still carbs which convert into sugar. So please measure them out no more than 1/2-1 cup of rice, oatmeal, pasta, and beans  Veggies are however free foods! Pile  them on.   Not all fruit is created equal. Please see the list below, the fruit at the bottom is higher in sugars than the fruit at the top. Please avoid all dried fruits.

## 2014-08-26 LAB — BASIC METABOLIC PANEL WITH GFR
BUN: 13 mg/dL (ref 6–23)
CHLORIDE: 104 meq/L (ref 96–112)
CO2: 29 meq/L (ref 19–32)
Calcium: 10.1 mg/dL (ref 8.4–10.5)
Creat: 1 mg/dL (ref 0.50–1.35)
GFR, Est African American: 88 mL/min
GFR, Est Non African American: 76 mL/min
GLUCOSE: 127 mg/dL — AB (ref 70–99)
POTASSIUM: 5.2 meq/L (ref 3.5–5.3)
Sodium: 143 mEq/L (ref 135–145)

## 2014-08-26 LAB — LIPID PANEL
CHOL/HDL RATIO: 3.8 ratio
CHOLESTEROL: 157 mg/dL (ref 0–200)
HDL: 41 mg/dL (ref >=40)
LDL CALC: 90 mg/dL (ref 0–99)
TRIGLYCERIDES: 131 mg/dL (ref <150)
VLDL: 26 mg/dL (ref 0–40)

## 2014-08-26 LAB — CBC WITH DIFFERENTIAL/PLATELET
BASOS PCT: 0 % (ref 0–1)
Basophils Absolute: 0 10*3/uL (ref 0.0–0.1)
EOS ABS: 0.2 10*3/uL (ref 0.0–0.7)
Eosinophils Relative: 4 % (ref 0–5)
HCT: 42.8 % (ref 39.0–52.0)
Hemoglobin: 14.1 g/dL (ref 13.0–17.0)
Lymphocytes Relative: 43 % (ref 12–46)
Lymphs Abs: 2.2 10*3/uL (ref 0.7–4.0)
MCH: 32.9 pg (ref 26.0–34.0)
MCHC: 32.9 g/dL (ref 30.0–36.0)
MCV: 100 fL (ref 78.0–100.0)
MPV: 9.5 fL (ref 8.6–12.4)
Monocytes Absolute: 0.5 10*3/uL (ref 0.1–1.0)
Monocytes Relative: 9 % (ref 3–12)
NEUTROS PCT: 44 % (ref 43–77)
Neutro Abs: 2.3 10*3/uL (ref 1.7–7.7)
PLATELETS: 239 10*3/uL (ref 150–400)
RBC: 4.28 MIL/uL (ref 4.22–5.81)
RDW: 13.1 % (ref 11.5–15.5)
WBC: 5.2 10*3/uL (ref 4.0–10.5)

## 2014-08-26 LAB — HEPATIC FUNCTION PANEL
ALT: 17 U/L (ref 0–53)
AST: 16 U/L (ref 0–37)
Albumin: 4.5 g/dL (ref 3.5–5.2)
Alkaline Phosphatase: 70 U/L (ref 39–117)
BILIRUBIN INDIRECT: 0.4 mg/dL (ref 0.2–1.2)
BILIRUBIN TOTAL: 0.5 mg/dL (ref 0.2–1.2)
Bilirubin, Direct: 0.1 mg/dL (ref 0.0–0.3)
Total Protein: 7.4 g/dL (ref 6.0–8.3)

## 2014-08-26 LAB — HEMOGLOBIN A1C
Hgb A1c MFr Bld: 6.9 % — ABNORMAL HIGH (ref <5.7)
Mean Plasma Glucose: 151 mg/dL — ABNORMAL HIGH (ref <117)

## 2014-08-26 LAB — TSH: TSH: 1.09 u[IU]/mL (ref 0.350–4.500)

## 2014-08-26 LAB — MAGNESIUM: MAGNESIUM: 2 mg/dL (ref 1.5–2.5)

## 2014-09-03 ENCOUNTER — Telehealth: Payer: Self-pay

## 2014-09-03 NOTE — Telephone Encounter (Signed)
Received a paper note from patient, he states that his blood pressures were a little low for him 110/60 and 100/65 and felt a little dizzy, return call to patient to advise him per Estill Bamberg to cut the Valsartan in half and increase fluids, per patient he already cuts the medication in half but states that he feels fine now, he said he thinks maybe he was sick and it was a "fluke". States that he will continue to use as he is and continue to monitor he states that he will call or come in if if happens again but blood pressures are back to normal and he feels fine.

## 2014-10-08 DIAGNOSIS — L570 Actinic keratosis: Secondary | ICD-10-CM | POA: Diagnosis not present

## 2014-10-08 DIAGNOSIS — L821 Other seborrheic keratosis: Secondary | ICD-10-CM | POA: Diagnosis not present

## 2014-10-08 DIAGNOSIS — Z85828 Personal history of other malignant neoplasm of skin: Secondary | ICD-10-CM | POA: Diagnosis not present

## 2014-10-08 DIAGNOSIS — L812 Freckles: Secondary | ICD-10-CM | POA: Diagnosis not present

## 2014-11-30 ENCOUNTER — Encounter: Payer: Self-pay | Admitting: Internal Medicine

## 2014-11-30 MED ORDER — METFORMIN HCL ER 500 MG PO TB24: ORAL_TABLET | ORAL | Status: DC

## 2014-11-30 NOTE — Patient Instructions (Signed)
Recommend Adult Low dose Aspirin or baby Aspirin 81 mg daily   To reduce risk of Colon Cancer 20 %,   Skin Cancer 26 % ,   Melanoma 46%   and   Pancreatic cancer 60%  ++++++++++++++++++  Vitamin D goal is between 70-100.   Please make sure that you are taking your Vitamin D as directed.   It is very important as a natural anti-inflammatory   helping hair, skin, and nails, as well as reducing stroke and heart attack risk.   It helps your bones and helps with mood.  It also decreases numerous cancer risks so please take it as directed.   Low Vit D is associated with a 200-300% higher risk for CANCER   and 200-300% higher risk for HEART   ATTACK  &  STROKE.    ......................................  It is also associated with higher death rate at younger ages,   autoimmune diseases like Rheumatoid arthritis, Lupus, Multiple Sclerosis.     Also many other serious conditions, like depression, Alzheimer's  Dementia, infertility, muscle aches, fatigue, fibromyalgia - just to name a few.  +++++++++++++++++++    Recommend the book "The END of DIETING" by Dr Joel Fuhrman   & the book "The END of DIABETES " by Dr Joel Fuhrman  At [employer] - get book & Audio CD's     Being diabetic has a  300% increased risk for heart attack, stroke, cancer, and alzheimer- type vascular dementia. It is very important that you work harder with diet by avoiding all foods that are white. Avoid white rice (brown & wild rice is OK), white potatoes (sweetpotatoes in moderation is OK), White bread or wheat bread or anything made out of white flour like bagels, donuts, rolls, buns, biscuits, cakes, pastries, cookies, pizza crust, and pasta (made from white flour & egg whites) - vegetarian pasta or spinach or wheat pasta is OK. Multigrain breads like Arnold's or [employer], or multigrain sandwich thins or flatbreads.  Diet, exercise and weight loss can reverse and cure diabetes in the early  stages.  Diet, exercise and weight loss is very important in the control and prevention of complications of diabetes which affects every system in your body, ie. Brain - dementia/stroke, eyes - glaucoma/blindness, heart - heart attack/heart failure, kidneys - dialysis, stomach - gastric paralysis, intestines - malabsorption, nerves - severe painful neuritis, circulation - gangrene & loss of a leg(s), and finally cancer and Alzheimers.    I recommend avoid fried & greasy foods,  sweets/candy, white rice (brown or wild rice or Quinoa is OK), white potatoes (sweet potatoes are OK) - anything made from white flour - bagels, doughnuts, rolls, buns, biscuits,white and wheat breads, pizza crust and traditional pasta made of white flour & egg white(vegetarian pasta or spinach or wheat pasta is OK).  Multi-grain bread is OK - like multi-grain flat bread or sandwich thins. Avoid alcohol in excess. Exercise is also important.    Eat all the vegetables you want - avoid meat, especially red meat and dairy - especially cheese.  Cheese is the most concentrated form of trans-fats which is the worst thing to clog up our arteries. Veggie cheese is OK which can be found in the fresh produce section at [employer] or [employer] or [employer]  ++++++++++++++++++++++++++  Preventive Care for Adults  A healthy lifestyle and preventive care can promote health and wellness. Preventive health guidelines for women include the following key practices.  A routine yearly physical is   a good way to check with your health care provider about your health and preventive screening. It is a chance to share any concerns and updates on your health and to receive a thorough exam.  Visit your dentist for a routine exam and preventive care every 6 months. Brush your teeth twice a day and floss once a day. Good oral hygiene prevents tooth decay and gum disease.  The frequency of eye exams is based on your age, health, family medical  history, use of contact lenses, and other factors. Follow your health care provider's recommendations for frequency of eye exams.  Eat a healthy diet. Foods like vegetables, fruits, whole grains, low-fat dairy products, and lean protein foods contain the nutrients you need without too many calories. Decrease your intake of foods high in solid fats, added sugars, and salt. Eat the right amount of calories for you.Get information about a proper diet from your health care provider, if necessary.  Regular physical exercise is one of the most important things you can do for your health. Most adults should get at least 150 minutes of moderate-intensity exercise (any activity that increases your heart rate and causes you to sweat) each week. In addition, most adults need muscle-strengthening exercises on 2 or more days a week.  Maintain a healthy weight. The body mass index (BMI) is a screening tool to identify possible weight problems. It provides an estimate of body fat based on height and weight. Your health care provider can find your BMI and can help you achieve or maintain a healthy weight.For adults 20 years and older:  A BMI below 18.5 is considered underweight.  A BMI of 18.5 to 24.9 is normal.  A BMI of 25 to 29.9 is considered overweight.  A BMI of 30 and above is considered obese.  Maintain normal blood lipids and cholesterol levels by exercising and minimizing your intake of saturated fat. Eat a balanced diet with plenty of fruit and vegetables. If your lipid or cholesterol levels are high, you are over 50, or you are at high risk for heart disease, you may need your cholesterol levels checked more frequently.Ongoing high lipid and cholesterol levels should be treated with medicines if diet and exercise are not working.  If you smoke, find out from your health care provider how to quit. If you do not use tobacco, do not start.  Lung cancer screening is recommended for adults aged 55-80  years who are at high risk for developing lung cancer because of a history of smoking. A yearly low-dose CT scan of the lungs is recommended for people who have at least a 30-pack-year history of smoking and are a current smoker or have quit within the past 15 years. A pack year of smoking is smoking an average of 1 pack of cigarettes a day for 1 year (for example: 1 pack a day for 30 years or 2 packs a day for 15 years). Yearly screening should continue until the smoker has stopped smoking for at least 15 years. Yearly screening should be stopped for people who develop a health problem that would prevent them from having lung cancer treatment.  Avoid use of street drugs. Do not share needles with anyone. Ask for help if you need support or instructions about stopping the use of drugs.  High blood pressure causes heart disease and increases the risk of stroke.  Ongoing high blood pressure should be treated with medicines if weight loss and exercise do not work.  If   you are 55-79 years old, ask your health care provider if you should take aspirin to prevent strokes.  Diabetes screening involves taking a blood sample to check your fasting blood sugar level. This should be done once every 3 years, after age 45, if you are within normal weight and without risk factors for diabetes. Testing should be considered at a younger age or be carried out more frequently if you are overweight and have at least 1 risk factor for diabetes.  Breast cancer screening is essential preventive care for women. You should practice "breast self-awareness." This means understanding the normal appearance and feel of your breasts and may include breast self-examination. Any changes detected, no matter how small, should be reported to a health care provider. Women in their 20s and 30s should have a clinical breast exam (CBE) by a health care provider as part of a regular health exam every 1 to 3 years. After age 40, women should have a  CBE every year. Starting at age 40, women should consider having a mammogram (breast X-ray test) every year. Women who have a family history of breast cancer should talk to their health care provider about genetic screening. Women at a high risk of breast cancer should talk to their health care providers about having an MRI and a mammogram every year.  Breast cancer gene (BRCA)-related cancer risk assessment is recommended for women who have family members with BRCA-related cancers. BRCA-related cancers include breast, ovarian, tubal, and peritoneal cancers. Having family members with these cancers may be associated with an increased risk for harmful changes (mutations) in the breast cancer genes BRCA1 and BRCA2. Results of the assessment will determine the need for genetic counseling and BRCA1 and BRCA2 testing.  Routine pelvic exams to screen for cancer are no longer recommended for nonpregnant women who are considered low risk for cancer of the pelvic organs (ovaries, uterus, and vagina) and who do not have symptoms. Ask your health care provider if a screening pelvic exam is right for you.  If you have had past treatment for cervical cancer or a condition that could lead to cancer, you need Pap tests and screening for cancer for at least 20 years after your treatment. If Pap tests have been discontinued, your risk factors (such as having a new sexual partner) need to be reassessed to determine if screening should be resumed. Some women have medical problems that increase the chance of getting cervical cancer. In these cases, your health care provider may recommend more frequent screening and Pap tests.    Colorectal cancer can be detected and often prevented. Most routine colorectal cancer screening begins at the age of 50 years and continues through age 75 years. However, your health care provider may recommend screening at an earlier age if you have risk factors for colon cancer. On a yearly basis,  your health care provider may provide home test kits to check for hidden blood in the stool. Use of a small camera at the end of a tube, to directly examine the colon (sigmoidoscopy or colonoscopy), can detect the earliest forms of colorectal cancer. Talk to your health care provider about this at age 50, when routine screening begins. Direct exam of the colon should be repeated every 5-10 years through age 75 years, unless early forms of pre-cancerous polyps or small growths are found.  Osteoporosis is a disease in which the bones lose minerals and strength with aging. This can result in serious bone fractures or breaks. The   risk of osteoporosis can be identified using a bone density scan. Women ages 65 years and over and women at risk for fractures or osteoporosis should discuss screening with their health care providers. Ask your health care provider whether you should take a calcium supplement or vitamin D to reduce the rate of osteoporosis.  Menopause can be associated with physical symptoms and risks. Hormone replacement therapy is available to decrease symptoms and risks. You should talk to your health care provider about whether hormone replacement therapy is right for you.  Use sunscreen. Apply sunscreen liberally and repeatedly throughout the day. You should seek shade when your shadow is shorter than you. Protect yourself by wearing long sleeves, pants, a wide-brimmed hat, and sunglasses year round, whenever you are outdoors.  Once a month, do a whole body skin exam, using a mirror to look at the skin on your back. Tell your health care provider of new moles, moles that have irregular borders, moles that are larger than a pencil eraser, or moles that have changed in shape or color.  Stay current with required vaccines (immunizations).  Influenza vaccine. All adults should be immunized every year.  Tetanus, diphtheria, and acellular pertussis (Td, Tdap) vaccine. Pregnant women should receive  1 dose of Tdap vaccine during each pregnancy. The dose should be obtained regardless of the length of time since the last dose. Immunization is preferred during the 27th-36th week of gestation. An adult who has not previously received Tdap or who does not know her vaccine status should receive 1 dose of Tdap. This initial dose should be followed by tetanus and diphtheria toxoids (Td) booster doses every 10 years. Adults with an unknown or incomplete history of completing a 3-dose immunization series with Td-containing vaccines should begin or complete a primary immunization series including a Tdap dose. Adults should receive a Td booster every 10 years.    Zoster vaccine. One dose is recommended for adults aged 60 years or older unless certain conditions are present.    Pneumococcal 13-valent conjugate (PCV13) vaccine. When indicated, a person who is uncertain of her immunization history and has no record of immunization should receive the PCV13 vaccine. An adult aged 19 years or older who has certain medical conditions and has not been previously immunized should receive 1 dose of PCV13 vaccine. This PCV13 should be followed with a dose of pneumococcal polysaccharide (PPSV23) vaccine. The PPSV23 vaccine dose should be obtained at least 8 weeks after the dose of PCV13 vaccine. An adult aged 19 years or older who has certain medical conditions and previously received 1 or more doses of PPSV23 vaccine should receive 1 dose of PCV13. The PCV13 vaccine dose should be obtained 1 or more years after the last PPSV23 vaccine dose.    Pneumococcal polysaccharide (PPSV23) vaccine. When PCV13 is also indicated, PCV13 should be obtained first. All adults aged 65 years and older should be immunized. An adult younger than age 65 years who has certain medical conditions should be immunized. Any person who resides in a nursing home or long-term care facility should be immunized. An adult smoker should be immunized.  People with an immunocompromised condition and certain other conditions should receive both PCV13 and PPSV23 vaccines. People with human immunodeficiency virus (HIV) infection should be immunized as soon as possible after diagnosis. Immunization during chemotherapy or radiation therapy should be avoided. Routine use of PPSV23 vaccine is not recommended for American Indians, [place] Natives, or people younger than 65 years unless there are medical   conditions that require PPSV23 vaccine. When indicated, people who have unknown immunization and have no record of immunization should receive PPSV23 vaccine. One-time revaccination 5 years after the first dose of PPSV23 is recommended for people aged 19-64 years who have chronic kidney failure, nephrotic syndrome, asplenia, or immunocompromised conditions. People who received 1-2 doses of PPSV23 before age 9 years should receive another dose of PPSV23 vaccine at age 62 years or later if at least 5 years have passed since the previous dose. Doses of PPSV23 are not needed for people immunized with PPSV23 at or after age 53 years.   Preventive Services / Frequency  Ages 32 years and over  Blood pressure check.  Lipid and cholesterol check.  Lung cancer screening. / Every year if you are aged 71-80 years and have a 30-pack-year history of smoking and currently smoke or have quit within the past 15 years. Yearly screening is stopped once you have quit smoking for at least 15 years or develop a health problem that would prevent you from having lung cancer treatment.  Clinical breast exam.** / Every year after age 65 years.  BRCA-related cancer risk assessment.** / For women who have family members with a BRCA-related cancer (breast, ovarian, tubal, or peritoneal cancers).  Mammogram.** / Every year beginning at age 46 years and continuing for as long as you are in good health. Consult with your health care provider.  Pap test.** / Every 3 years starting at  age 71 years through age 47 or 50 years with 3 consecutive normal Pap tests. Testing can be stopped between 65 and 70 years with 3 consecutive normal Pap tests and no abnormal Pap or HPV tests in the past 10 years.  Fecal occult blood test (FOBT) of stool. / Every year beginning at age 44 years and continuing until age 73 years. You may not need to do this test if you get a colonoscopy every 10 years.  Flexible sigmoidoscopy or colonoscopy.** / Every 5 years for a flexible sigmoidoscopy or every 10 years for a colonoscopy beginning at age 37 years and continuing until age 2 years.  Hepatitis C blood test.** / For all people born from [birth year] through 1967 and any individual with known risks for hepatitis C.  Osteoporosis screening.** / A one-time screening for women ages 9 years and over and women at risk for fractures or osteoporosis.  Skin self-exam. / Monthly.  Influenza vaccine. / Every year.  Tetanus, diphtheria, and acellular pertussis (Tdap/Td) vaccine.** / 1 dose of Td every 10 years.  Zoster vaccine.** / 1 dose for adults aged 31 years or older.  Pneumococcal 13-valent conjugate (PCV13) vaccine.** / Consult your health care provider.  Pneumococcal polysaccharide (PPSV23) vaccine.** / 1 dose for all adults aged 32 years and older. Screening for abdominal aortic aneurysm (AAA)  by ultrasound is recommended for people who have history of high blood pressure or who are current or former smokers.

## 2014-11-30 NOTE — Progress Notes (Signed)
Patient ID: Joshua Higgins, male   DOB: 1945-08-01, 69 y.o.   MRN: [id number]   This very nice 69 y.o. MWM presents for 6 month follow up with Hypertension, ASCAD,  Hyperlipidemia, [name] w/CKD2 and Vitamin D Deficiency.    Patient is treated for HTN since 1989 & BP has been controlled at home. Today's BP: 120/74 mmHg. Patient has ASCAD and had PTCA in 2001 then later underwent underwent CABG in 2008 & has done well since w/o any suspect cardiac type chest pain, palpitations, dyspnea/orthopnea/PND, dizziness, claudication, or dependent edema. He did have a negative Cardiolite scan in 2013.   Hyperlipidemia is controlled with diet & meds. Patient denies myalgias or other med SE's. Last Lipids were at goal - Total Chol 157; HDL 41; LDL  90; Trig 131 on 08/25/2014.   Also, the patient has history of [name] since 2000 and has had no symptoms of reactive hypoglycemia, diabetic polys, paresthesias or visual blurring. Reports CBG's always less than 140 mg%. Last A1c was 6.9% on 08/25/2014.    Further, the patient also has history of Vitamin D Deficiency of 33 in 2008 and supplements vitamin D without any suspected side-effects. Last Vit D was 84 in Nov 2015.    Medication Sig  . acetaminophen  500 MG tabl Take 1,000 mg by mouth every 4 (four) hours as needed.  Marland Kitchen acyclovir (ZOVIRAX) 800 MG tab Take 1 tab 3  times daily as needed 9  . aspirin 325 MG tablet Take 325 mg by mouth at bedtime.   Marland Kitchen atenolol  50 MG tablet TAKE 1 TABLET BY MOUTH EVERY DAY  . Cholecalciferol 5000 UNITS TABS Take 1 tablet by mouth daily.  . diazepam (VALIUM) 5 MG tab Take 1 tablet (5 mg total) by mouth every 6 (six) hours as needed for muscle spasms.  . Ibuprofen 100 MG tab Take 400 mg by mouth every 6 (six) hours as needed for fever.  . IRON CR PO Take 1 tablet by mouth daily.  Marland Kitchen levothyroxine  50 MCG tab TAKE 1 TABLET BY MOUTH EVERY DAY  . Multiple Vitamin  Take 1 tablet by mouth at bedtime.   Marland Kitchen FISH OIL 1000 MG CAPS Take  1,000 mg by mouth at bedtime.   . rosuvastatin (CRESTOR) 20 MG tab Take 1 tablet (20 mg total) by mouth at bedtime.  . sildenafil (REVATIO) 20 MG tab Take 1 tab 1 times daily.  . valsartan (DIOVAN) 80 MG tab 1/2- 1 pill daily for blood pressure and kidney protection  . metFORMIN  500 MG tab  Take 2 tablets in the morning and 1 tablet at night)   Allergies  Allergen Reactions  . Ace Inhibitors Cough  . Lipitor [Atorvastatin] Other (See Comments)    Joint pain.   PMHx:   Past Medical History  Diagnosis Date  . Diabetes mellitus without complication     type II  . Hyperlipidemia   . GERD (gastroesophageal reflux disease)   . Coronary artery disease     CABG - 2008  . Migraine headache with aura     takes Atenolol  . Sleep apnea     does not use cpap  . Hypothyroidism   . Arthritis   . Cancer     skin cancer on face  . Anemia     low iron  . Hepatitis   . Cataracts, bilateral   . Hypertension     pt states he does not have HTN, takes Atenolol for  migraines   Immunization History  Administered Date(s) Administered  . DT 08/29/2003  . Influenza-Unspecified 04/16/2014  . Pneumococcal Conjugate-13 12/26/2013  . Pneumococcal Polysaccharide-23 01/29/2010  . Zoster 05/11/2010   Past Surgical History  Procedure Laterality Date  . Cardiac surgery      Bypass  . Knee surgery Right     arthroscopic  . Back surgery    . Coronary angioplasty  2001  . Coronary artery bypass graft    . Surgery scrotal / testicular      as a child/teenager  . Elbow surgery Left     due to damaged nerve  . Colonoscopy    . Lumbar laminectomy/decompression microdiscectomy Right 08/04/2014    Procedure: Right Lumbar four-five Microdiskectomy;  Surgeon: Kristeen Miss, MD;  Location: [facility];  Service: Neurosurgery;  Laterality: Right;  Right L4-5 Microdiskectomy   FHx:    Reviewed / unchanged  SHx:    Reviewed / unchanged  Systems Review:  Constitutional: Denies fever, chills, wt changes,  headaches, insomnia, fatigue, night sweats, change in appetite. Eyes: Denies redness, blurred vision, diplopia, discharge, itchy, watery eyes.  ENT: Denies discharge, congestion, post nasal drip, epistaxis, sore throat, earache, hearing loss, dental pain, tinnitus, vertigo, sinus pain, snoring.  CV: Denies chest pain, palpitations, irregular heartbeat, syncope, dyspnea, diaphoresis, orthopnea, PND, claudication or edema. Respiratory: denies cough, dyspnea, DOE, pleurisy, hoarseness, laryngitis, wheezing.  Gastrointestinal: Denies dysphagia, odynophagia, heartburn, reflux, water brash, abdominal pain or cramps, nausea, vomiting, bloating, diarrhea, constipation, hematemesis, melena, hematochezia  or hemorrhoids. Genitourinary: Denies dysuria, frequency, urgency, nocturia, hesitancy, discharge, hematuria or flank pain. Musculoskeletal: Denies arthralgias, myalgias, stiffness, jt. swelling, pain, limping or strain/sprain.  Skin: Denies pruritus, rash, hives, warts, acne, eczema or change in skin lesion(s). Neuro: No weakness, tremor, incoordination, spasms, paresthesia or pain. Psychiatric: Denies confusion, memory loss or sensory loss. Endo: Denies change in weight, skin or hair change.  Heme/Lymph: No excessive bleeding, bruising or enlarged lymph nodes.  Physical Exam  BP 120/74   Pulse 72  Temp 97.3 F   Resp 16  Ht 5\' 9"    Wt 190 lb 12.8 oz     BMI 28.16   Appears well nourished and in no distress. Eyes: PERRLA, EOMs, conjunctiva no swelling or erythema. Sinuses: No frontal/maxillary tenderness ENT/Mouth: EAC's clear, TM's nl w/o erythema, bulging. Nares clear w/o erythema, swelling, exudates. Oropharynx clear without erythema or exudates. Oral hygiene is good. Tongue normal, non obstructing. Hearing intact.  Neck: Supple. Thyroid nl. Car 2+/2+ without bruits, nodes or JVD. Chest: Respirations nl with BS clear & equal w/o rales, rhonchi, wheezing or stridor.  Cor: Heart sounds normal  w/ regular rate and rhythm without sig. murmurs, gallops, clicks, or rubs. Peripheral pulses normal and equal  without edema.  Abdomen: Soft & bowel sounds normal. Non-tender w/o guarding, rebound, hernias, masses, or organomegaly.  Lymphatics: Unremarkable.  Musculoskeletal: Full ROM all peripheral extremities, joint stability, 5/5 strength, and normal gait.  Skin: Warm, dry without exposed rashes, lesions or ecchymosis apparent.  Neuro: Cranial nerves intact, reflexes equal bilaterally. Sensory-motor testing grossly intact. Tendon reflexes grossly intact.  Pysch: Alert & oriented x 3.  Insight and judgement nl & appropriate. No ideations.  Assessment and Plan:  1. Essential hypertension  - TSH  2. Hyperlipidemia  - Lipid panel  3. Type 2 diabetes mellitus with diabetic nephropathy  - metFORMIN (GLUCOPHAGE XR) 500 MG 24 hr tablet; Take 1 to 2 tablets  2 x day  - Hemoglobin A1c -  Insulin, random  4. Vitamin D deficiency  - Vit D  25 hydroxy   5. Gastroesophageal reflux disease   6. Medication management  - CBC with Differential/Platelet - BASIC METABOLIC PANEL WITH GFR - Hepatic function panel - Magnesium   Recommended regular exercise, BP monitoring, weight control, and discussed med and SE's. Recommended labs to assess and monitor clinical status. Further disposition pending results of labs. Over 30 minutes of exam, counseling, chart review was performed

## 2014-12-02 ENCOUNTER — Encounter: Payer: Self-pay | Admitting: Internal Medicine

## 2014-12-02 ENCOUNTER — Ambulatory Visit (INDEPENDENT_AMBULATORY_CARE_PROVIDER_SITE_OTHER): Payer: Medicare Other | Admitting: Internal Medicine

## 2014-12-02 VITALS — BP 120/74 | HR 72 | Temp 97.3°F | Resp 16 | Ht 69.0 in | Wt 190.8 lb

## 2014-12-02 DIAGNOSIS — E785 Hyperlipidemia, unspecified: Secondary | ICD-10-CM

## 2014-12-02 DIAGNOSIS — E559 Vitamin D deficiency, unspecified: Secondary | ICD-10-CM | POA: Diagnosis not present

## 2014-12-02 DIAGNOSIS — E1121 Type 2 diabetes mellitus with diabetic nephropathy: Secondary | ICD-10-CM | POA: Diagnosis not present

## 2014-12-02 DIAGNOSIS — K219 Gastro-esophageal reflux disease without esophagitis: Secondary | ICD-10-CM

## 2014-12-02 DIAGNOSIS — Z79899 Other long term (current) drug therapy: Secondary | ICD-10-CM | POA: Diagnosis not present

## 2014-12-02 DIAGNOSIS — I10 Essential (primary) hypertension: Secondary | ICD-10-CM | POA: Diagnosis not present

## 2014-12-02 DIAGNOSIS — E1129 Type 2 diabetes mellitus with other diabetic kidney complication: Secondary | ICD-10-CM | POA: Diagnosis not present

## 2014-12-02 LAB — CBC WITH DIFFERENTIAL/PLATELET
BASOS PCT: 0 % (ref 0–1)
Basophils Absolute: 0 10*3/uL (ref 0.0–0.1)
Eosinophils Absolute: 0.1 10*3/uL (ref 0.0–0.7)
Eosinophils Relative: 3 % (ref 0–5)
HCT: 39.3 % (ref 39.0–52.0)
Hemoglobin: 13.1 g/dL (ref 13.0–17.0)
LYMPHS ABS: 1.7 10*3/uL (ref 0.7–4.0)
Lymphocytes Relative: 42 % (ref 12–46)
MCH: 32.9 pg (ref 26.0–34.0)
MCHC: 33.3 g/dL (ref 30.0–36.0)
MCV: 98.7 fL (ref 78.0–100.0)
MPV: 9.7 fL (ref 8.6–12.4)
Monocytes Absolute: 0.4 10*3/uL (ref 0.1–1.0)
Monocytes Relative: 9 % (ref 3–12)
NEUTROS ABS: 1.9 10*3/uL (ref 1.7–7.7)
Neutrophils Relative %: 46 % (ref 43–77)
PLATELETS: 173 10*3/uL (ref 150–400)
RBC: 3.98 MIL/uL — AB (ref 4.22–5.81)
RDW: 13.5 % (ref 11.5–15.5)
WBC: 4.1 10*3/uL (ref 4.0–10.5)

## 2014-12-02 LAB — TSH: TSH: 1.197 u[IU]/mL (ref 0.350–4.500)

## 2014-12-02 LAB — BASIC METABOLIC PANEL WITH GFR
BUN: 10 mg/dL (ref 6–23)
CO2: 29 mEq/L (ref 19–32)
CREATININE: 0.84 mg/dL (ref 0.50–1.35)
Calcium: 9.4 mg/dL (ref 8.4–10.5)
Chloride: 103 mEq/L (ref 96–112)
GFR, Est African American: >89 mL/min
GFR, Est Non African American: 88 mL/min
Glucose, Bld: 159 mg/dL — ABNORMAL HIGH (ref 70–99)
Potassium: 4.6 mEq/L (ref 3.5–5.3)
SODIUM: 142 meq/L (ref 135–145)

## 2014-12-02 LAB — LIPID PANEL
Cholesterol: 123 mg/dL (ref 0–200)
HDL: 38 mg/dL — ABNORMAL LOW (ref >=40)
LDL CALC: 63 mg/dL (ref 0–99)
TRIGLYCERIDES: 110 mg/dL (ref <150)
Total CHOL/HDL Ratio: 3.2 Ratio
VLDL: 22 mg/dL (ref 0–40)

## 2014-12-02 LAB — HEPATIC FUNCTION PANEL
ALBUMIN: 4.2 g/dL (ref 3.5–5.2)
ALT: 20 U/L (ref 0–53)
AST: 17 U/L (ref 0–37)
Alkaline Phosphatase: 47 U/L (ref 39–117)
BILIRUBIN DIRECT: 0.1 mg/dL (ref 0.0–0.3)
BILIRUBIN TOTAL: 0.5 mg/dL (ref 0.2–1.2)
Indirect Bilirubin: 0.4 mg/dL (ref 0.2–1.2)
TOTAL PROTEIN: 6.4 g/dL (ref 6.0–8.3)

## 2014-12-02 LAB — MAGNESIUM: MAGNESIUM: 1.8 mg/dL (ref 1.5–2.5)

## 2014-12-03 LAB — VITAMIN D 25 HYDROXY (VIT D DEFICIENCY, FRACTURES): Vit D, 25-Hydroxy: 76 ng/mL (ref 30–100)

## 2014-12-03 LAB — INSULIN, RANDOM: Insulin: 39 u[IU]/mL — ABNORMAL HIGH (ref 2.0–19.6)

## 2014-12-03 LAB — HEMOGLOBIN A1C
Hgb A1c MFr Bld: 6.8 % — ABNORMAL HIGH (ref <5.7)
MEAN PLASMA GLUCOSE: 148 mg/dL — AB (ref <117)

## 2015-01-06 ENCOUNTER — Other Ambulatory Visit: Payer: Self-pay | Admitting: Internal Medicine

## 2015-01-09 ENCOUNTER — Encounter: Payer: Self-pay | Admitting: *Deleted

## 2015-01-12 ENCOUNTER — Other Ambulatory Visit: Payer: Self-pay | Admitting: Internal Medicine

## 2015-01-12 DIAGNOSIS — R059 Cough, unspecified: Secondary | ICD-10-CM

## 2015-01-12 DIAGNOSIS — R05 Cough: Secondary | ICD-10-CM

## 2015-01-12 MED ORDER — PROMETHAZINE-CODEINE 6.25-10 MG/5ML PO SYRP
ORAL_SOLUTION | ORAL | Status: AC
Start: 2015-01-12 — End: 2015-02-12

## 2015-01-27 ENCOUNTER — Encounter: Payer: Self-pay | Admitting: Cardiovascular Disease

## 2015-02-01 ENCOUNTER — Other Ambulatory Visit: Payer: Self-pay | Admitting: Emergency Medicine

## 2015-03-04 ENCOUNTER — Ambulatory Visit (INDEPENDENT_AMBULATORY_CARE_PROVIDER_SITE_OTHER): Payer: Medicare Other | Admitting: Internal Medicine

## 2015-03-04 VITALS — BP 132/64 | HR 70 | Temp 98.4°F | Resp 18 | Ht 69.0 in | Wt 194.0 lb

## 2015-03-04 DIAGNOSIS — E1129 Type 2 diabetes mellitus with other diabetic kidney complication: Secondary | ICD-10-CM | POA: Diagnosis not present

## 2015-03-04 DIAGNOSIS — E559 Vitamin D deficiency, unspecified: Secondary | ICD-10-CM | POA: Diagnosis not present

## 2015-03-04 DIAGNOSIS — Z79899 Other long term (current) drug therapy: Secondary | ICD-10-CM | POA: Diagnosis not present

## 2015-03-04 DIAGNOSIS — I10 Essential (primary) hypertension: Secondary | ICD-10-CM

## 2015-03-04 DIAGNOSIS — E785 Hyperlipidemia, unspecified: Secondary | ICD-10-CM

## 2015-03-04 DIAGNOSIS — Z0000 Encounter for general adult medical examination without abnormal findings: Secondary | ICD-10-CM

## 2015-03-04 DIAGNOSIS — E1121 Type 2 diabetes mellitus with diabetic nephropathy: Secondary | ICD-10-CM | POA: Diagnosis not present

## 2015-03-04 LAB — BASIC METABOLIC PANEL WITH GFR
BUN: 15 mg/dL (ref 7–25)
CO2: 27 mmol/L (ref 20–31)
CREATININE: 1 mg/dL (ref 0.70–1.18)
Calcium: 9.9 mg/dL (ref 8.6–10.3)
Chloride: 103 mmol/L (ref 98–110)
GFR, EST AFRICAN AMERICAN: 87 mL/min (ref >=60)
GFR, Est Non African American: 75 mL/min (ref >=60)
Glucose, Bld: 182 mg/dL — ABNORMAL HIGH (ref 65–99)
Potassium: 4.7 mmol/L (ref 3.5–5.3)
SODIUM: 142 mmol/L (ref 135–146)

## 2015-03-04 LAB — CBC WITH DIFFERENTIAL/PLATELET
BASOS PCT: 0 % (ref 0–1)
Basophils Absolute: 0 10*3/uL (ref 0.0–0.1)
EOS ABS: 0.2 10*3/uL (ref 0.0–0.7)
EOS PCT: 5 % (ref 0–5)
HCT: 40.3 % (ref 39.0–52.0)
HEMOGLOBIN: 13.7 g/dL (ref 13.0–17.0)
Lymphocytes Relative: 38 % (ref 12–46)
Lymphs Abs: 1.9 10*3/uL (ref 0.7–4.0)
MCH: 33.6 pg (ref 26.0–34.0)
MCHC: 34 g/dL (ref 30.0–36.0)
MCV: 98.8 fL (ref 78.0–100.0)
MONO ABS: 0.4 10*3/uL (ref 0.1–1.0)
MONOS PCT: 8 % (ref 3–12)
MPV: 9.3 fL (ref 8.6–12.4)
Neutro Abs: 2.4 10*3/uL (ref 1.7–7.7)
Neutrophils Relative %: 49 % (ref 43–77)
Platelets: 170 10*3/uL (ref 150–400)
RBC: 4.08 MIL/uL — ABNORMAL LOW (ref 4.22–5.81)
RDW: 13.7 % (ref 11.5–15.5)
WBC: 4.9 10*3/uL (ref 4.0–10.5)

## 2015-03-04 LAB — HEPATIC FUNCTION PANEL
ALT: 24 U/L (ref 9–46)
AST: 20 U/L (ref 10–35)
Albumin: 4.7 g/dL (ref 3.6–5.1)
Alkaline Phosphatase: 45 U/L (ref 40–115)
Bilirubin, Direct: 0.2 mg/dL (ref <=0.2)
Indirect Bilirubin: 0.4 mg/dL (ref 0.2–1.2)
TOTAL PROTEIN: 6.7 g/dL (ref 6.1–8.1)
Total Bilirubin: 0.6 mg/dL (ref 0.2–1.2)

## 2015-03-04 LAB — LIPID PANEL
CHOL/HDL RATIO: 3.3 ratio (ref <=5.0)
CHOLESTEROL: 129 mg/dL (ref 125–200)
HDL: 39 mg/dL — ABNORMAL LOW (ref >=40)
LDL Cholesterol: 66 mg/dL (ref <130)
Triglycerides: 119 mg/dL (ref <150)
VLDL: 24 mg/dL (ref <30)

## 2015-03-04 LAB — HEMOGLOBIN A1C
Hgb A1c MFr Bld: 7 % — ABNORMAL HIGH (ref <5.7)
MEAN PLASMA GLUCOSE: 154 mg/dL — AB (ref <117)

## 2015-03-04 LAB — TSH: TSH: 1.524 u[IU]/mL (ref 0.350–4.500)

## 2015-03-04 LAB — MAGNESIUM: MAGNESIUM: 1.8 mg/dL (ref 1.5–2.5)

## 2015-03-04 NOTE — Progress Notes (Signed)
Patient ID: Joshua Higgins, male   DOB: 13-Aug-1945, 69 y.o.   MRN: [id number]  Assessment and Plan:  Hypertension:  -Continue medication -monitor blood pressure at home. -Continue DASH diet -Reminder to go to the ER if any CP, SOB, nausea, dizziness, severe HA, changes vision/speech, left arm numbness and tingling and jaw pain.  Cholesterol - Continue diet and exercise -Check cholesterol.   Diabetes without complications -Continue diet and exercise.  -Check A1C  Vitamin D Def -check level -continue medications.   Continue diet and meds as discussed. Further disposition pending results of labs. Discussed med's effects and SE's.    HPI 69 y.o. male  presents for 3 month follow up with hypertension, hyperlipidemia, diabetes and vitamin D deficiency.   His blood pressure has been controlled at home, today their BP is BP: 132/64 mmHg.He does not workout. He denies chest pain, shortness of breath, dizziness.   He is on cholesterol medication and denies myalgias. His cholesterol is at goal. The cholesterol was:  12/02/2014: Cholesterol 123; HDL 38*; LDL Cholesterol 63; Triglycerides 110   He has been working on diet and exercise for diabetes without complications, he is on bASA, he is on ACE/ARB, and denies  foot ulcerations, hyperglycemia, hypoglycemia , increased appetite, nausea, paresthesia of the feet, polydipsia, polyuria, visual disturbances, vomiting and weight loss. Last A1C was: 12/02/2014: Hgb A1c MFr Bld 6.8*   Patient is on Vitamin D supplement. 12/02/2014: Vit D, 25-Hydroxy 76    Current Medications:  Current Outpatient Prescriptions on File Prior to Visit  Medication Sig Dispense Refill  . acetaminophen (TYLENOL) 500 MG tablet Take 1,000 mg by mouth every 4 (four) hours as needed.    Marland Kitchen acyclovir (ZOVIRAX) 800 MG tablet Take 800 mg by mouth 3 (three) times daily as needed (fever blister).    Marland Kitchen aspirin 325 MG tablet Take 325 mg by mouth at bedtime.     Marland Kitchen atenolol  (TENORMIN) 50 MG tablet TAKE 1 TABLET BY MOUTH DAILY 90 tablet 3  . Cholecalciferol 5000 UNITS TABS Take 1 tablet by mouth daily.    Marland Kitchen ibuprofen (ADVIL,MOTRIN) 100 MG tablet Take 400 mg by mouth every 6 (six) hours as needed for fever.    . IRON CR PO Take 1 tablet by mouth daily.    Marland Kitchen levothyroxine (SYNTHROID, LEVOTHROID) 50 MCG tablet TAKE 1 TABLET BY MOUTH EVERY DAY 90 tablet 99  . metFORMIN (GLUCOPHAGE XR) 500 MG 24 hr tablet Take 1 to 2 tablets  2 x day as directed for Diabetes. 360 tablet 99  . Multiple Vitamin (MULTIVITAMIN) tablet Take 1 tablet by mouth at bedtime.     . Omega-3 Fatty Acids (FISH OIL) 1000 MG CAPS Take 1,000 mg by mouth at bedtime.     . sildenafil (REVATIO) 20 MG tablet Take 1 tablet (20 mg total) by mouth 3 (three) times daily. 30 tablet 0  . valsartan (DIOVAN) 80 MG tablet 1/2- 1 pill daily for blood pressure and kidney protection 30 tablet 3   No current facility-administered medications on file prior to visit.   Medical History:  Past Medical History  Diagnosis Date  . Diabetes mellitus without complication     type II  . Hyperlipidemia   . GERD (gastroesophageal reflux disease)   . Coronary artery disease     CABG - 2008  . Migraine headache with aura     takes Atenolol  . Sleep apnea     does not use cpap  . Hypothyroidism   .  Arthritis   . Cancer     skin cancer on face  . Anemia     low iron  . Hepatitis   . Cataracts, bilateral   . Hypertension     pt states he does not have HTN, takes Atenolol for migraines   Allergies:  Allergies  Allergen Reactions  . Ace Inhibitors Cough  . Lipitor [Atorvastatin] Other (See Comments)    Joint pain.     Review of Systems:  Review of Systems  Constitutional: Negative for fever, chills and malaise/fatigue.  HENT: Negative for congestion, ear pain and sore throat.   Eyes: Negative.   Respiratory: Negative for cough, shortness of breath and wheezing.   Cardiovascular: Negative for chest pain,  palpitations and leg swelling.  Gastrointestinal: Negative for heartburn, diarrhea, constipation, blood in stool and melena.  Genitourinary: Negative.   Skin: Negative.   Neurological: Negative for dizziness, sensory change, loss of consciousness and headaches.  Psychiatric/Behavioral: Negative for depression. The patient is not nervous/anxious and does not have insomnia.     Family history- Review and unchanged  Social history- Review and unchanged  Physical Exam: BP 132/64 mmHg  Pulse 70  Temp(Src) 98.4 F (36.9 C) (Temporal)  Resp 18  Ht 5\' 9"  (1.753 m)  Wt 194 lb (87.998 kg)  BMI 28.64 kg/m2 Wt Readings from Last 3 Encounters:  03/04/15 194 lb (87.998 kg)  12/02/14 190 lb 12.8 oz (86.546 kg)  08/25/14 185 lb (83.915 kg)   General Appearance: Well nourished well developed, non-toxic appearing, in no apparent distress. Eyes: PERRLA, EOMs, conjunctiva no swelling or erythema ENT/Mouth: Ear canals clear with no erythema, swelling, or discharge.  TMs normal bilaterally, oropharynx clear, moist, with no exudate.   Neck: Supple, thyroid normal, no JVD, no cervical adenopathy.  Respiratory: Respiratory effort normal, breath sounds clear A&P, no wheeze, rhonchi or rales noted.  No retractions, no accessory muscle usage Cardio: RRR with no MRGs. No noted edema.  Abdomen: Soft, + BS.  Non tender, no guarding, rebound, ventral hernia vs. Diastasis of abdomen, no masses. Musculoskeletal: Full ROM, 5/5 strength, Normal gait Skin: Warm, dry without rashes, lesions, ecchymosis.  Neuro: Awake and oriented X 3, Cranial nerves intact. No cerebellar symptoms.  Psych: normal affect, Insight and Judgment appropriate.    Starlyn Skeans, PA-C 9:21 AM [facility]

## 2015-03-04 NOTE — Assessment & Plan Note (Signed)
Medicare traditional, November 2015

## 2015-03-05 ENCOUNTER — Encounter: Payer: Self-pay | Admitting: Cardiovascular Disease

## 2015-03-05 LAB — INSULIN, RANDOM: INSULIN: 44 u[IU]/mL — AB (ref 2.0–19.6)

## 2015-03-05 LAB — VITAMIN D 25 HYDROXY (VIT D DEFICIENCY, FRACTURES): Vit D, 25-Hydroxy: 75 ng/mL (ref 30–100)

## 2015-04-14 DIAGNOSIS — Z23 Encounter for immunization: Secondary | ICD-10-CM | POA: Diagnosis not present

## 2015-04-23 ENCOUNTER — Other Ambulatory Visit: Payer: Self-pay | Admitting: Internal Medicine

## 2015-04-23 ENCOUNTER — Other Ambulatory Visit: Payer: Self-pay | Admitting: Physician Assistant

## 2015-06-04 ENCOUNTER — Encounter: Payer: Self-pay | Admitting: Internal Medicine

## 2015-06-04 ENCOUNTER — Ambulatory Visit (INDEPENDENT_AMBULATORY_CARE_PROVIDER_SITE_OTHER): Payer: Medicare Other | Admitting: Internal Medicine

## 2015-06-04 VITALS — BP 134/66 | HR 72 | Temp 97.3°F | Resp 16 | Ht 69.0 in | Wt 191.2 lb

## 2015-06-04 DIAGNOSIS — I10 Essential (primary) hypertension: Secondary | ICD-10-CM | POA: Diagnosis not present

## 2015-06-04 DIAGNOSIS — R6889 Other general symptoms and signs: Secondary | ICD-10-CM | POA: Diagnosis not present

## 2015-06-04 DIAGNOSIS — Z23 Encounter for immunization: Secondary | ICD-10-CM

## 2015-06-04 DIAGNOSIS — Z0000 Encounter for general adult medical examination without abnormal findings: Secondary | ICD-10-CM

## 2015-06-04 DIAGNOSIS — E785 Hyperlipidemia, unspecified: Secondary | ICD-10-CM | POA: Diagnosis not present

## 2015-06-04 DIAGNOSIS — Z79899 Other long term (current) drug therapy: Secondary | ICD-10-CM

## 2015-06-04 DIAGNOSIS — E1121 Type 2 diabetes mellitus with diabetic nephropathy: Secondary | ICD-10-CM

## 2015-06-04 DIAGNOSIS — Z1212 Encounter for screening for malignant neoplasm of rectum: Secondary | ICD-10-CM

## 2015-06-04 DIAGNOSIS — E559 Vitamin D deficiency, unspecified: Secondary | ICD-10-CM | POA: Diagnosis not present

## 2015-06-04 DIAGNOSIS — E1129 Type 2 diabetes mellitus with other diabetic kidney complication: Secondary | ICD-10-CM | POA: Diagnosis not present

## 2015-06-04 DIAGNOSIS — Z0001 Encounter for general adult medical examination with abnormal findings: Secondary | ICD-10-CM

## 2015-06-04 DIAGNOSIS — K589 Irritable bowel syndrome without diarrhea: Secondary | ICD-10-CM

## 2015-06-04 DIAGNOSIS — K219 Gastro-esophageal reflux disease without esophagitis: Secondary | ICD-10-CM

## 2015-06-04 DIAGNOSIS — Z125 Encounter for screening for malignant neoplasm of prostate: Secondary | ICD-10-CM

## 2015-06-04 DIAGNOSIS — D539 Nutritional anemia, unspecified: Secondary | ICD-10-CM | POA: Diagnosis not present

## 2015-06-04 DIAGNOSIS — E349 Endocrine disorder, unspecified: Secondary | ICD-10-CM

## 2015-06-04 DIAGNOSIS — E291 Testicular hypofunction: Secondary | ICD-10-CM | POA: Diagnosis not present

## 2015-06-04 LAB — BASIC METABOLIC PANEL WITH GFR
BUN: 12 mg/dL (ref 7–25)
CO2: 29 mmol/L (ref 20–31)
Calcium: 9.3 mg/dL (ref 8.6–10.3)
Chloride: 101 mmol/L (ref 98–110)
Creat: 0.9 mg/dL (ref 0.70–1.18)
GFR, Est African American: >89 mL/min (ref >=60)
GFR, Est Non African American: 86 mL/min (ref >=60)
GLUCOSE: 116 mg/dL — AB (ref 65–99)
Potassium: 4 mmol/L (ref 3.5–5.3)
Sodium: 139 mmol/L (ref 135–146)

## 2015-06-04 LAB — LIPID PANEL
CHOL/HDL RATIO: 3.4 ratio (ref <=5.0)
CHOLESTEROL: 128 mg/dL (ref 125–200)
HDL: 38 mg/dL — ABNORMAL LOW (ref >=40)
LDL Cholesterol: 64 mg/dL (ref <130)
TRIGLYCERIDES: 131 mg/dL (ref <150)
VLDL: 26 mg/dL (ref <30)

## 2015-06-04 LAB — IRON AND TIBC
%SAT: 44 % (ref 15–60)
Iron: 129 ug/dL (ref 50–180)
TIBC: 291 ug/dL (ref 250–425)
UIBC: 162 ug/dL (ref 125–400)

## 2015-06-04 LAB — CBC WITH DIFFERENTIAL/PLATELET
BASOS ABS: 0 10*3/uL (ref 0.0–0.1)
Basophils Relative: 0 % (ref 0–1)
Eosinophils Absolute: 0.1 10*3/uL (ref 0.0–0.7)
Eosinophils Relative: 3 % (ref 0–5)
HEMATOCRIT: 41.5 % (ref 39.0–52.0)
HEMOGLOBIN: 13.6 g/dL (ref 13.0–17.0)
LYMPHS PCT: 41 % (ref 12–46)
Lymphs Abs: 1.9 10*3/uL (ref 0.7–4.0)
MCH: 32.9 pg (ref 26.0–34.0)
MCHC: 32.8 g/dL (ref 30.0–36.0)
MCV: 100.2 fL — AB (ref 78.0–100.0)
MPV: 9.9 fL (ref 8.6–12.4)
Monocytes Absolute: 0.5 10*3/uL (ref 0.1–1.0)
Monocytes Relative: 10 % (ref 3–12)
NEUTROS ABS: 2.1 10*3/uL (ref 1.7–7.7)
NEUTROS PCT: 46 % (ref 43–77)
Platelets: 176 10*3/uL (ref 150–400)
RBC: 4.14 MIL/uL — ABNORMAL LOW (ref 4.22–5.81)
RDW: 13.1 % (ref 11.5–15.5)
WBC: 4.6 10*3/uL (ref 4.0–10.5)

## 2015-06-04 LAB — HEPATIC FUNCTION PANEL
ALT: 24 U/L (ref 9–46)
AST: 21 U/L (ref 10–35)
Albumin: 4.2 g/dL (ref 3.6–5.1)
Alkaline Phosphatase: 47 U/L (ref 40–115)
BILIRUBIN DIRECT: 0.1 mg/dL (ref <=0.2)
Indirect Bilirubin: 0.6 mg/dL (ref 0.2–1.2)
TOTAL PROTEIN: 6.8 g/dL (ref 6.1–8.1)
Total Bilirubin: 0.7 mg/dL (ref 0.2–1.2)

## 2015-06-04 LAB — MAGNESIUM: MAGNESIUM: 1.9 mg/dL (ref 1.5–2.5)

## 2015-06-04 NOTE — Addendum Note (Signed)
Addended by: Melbourne Abts C on: 06/04/2015 01:27 PM   Modules accepted: Orders

## 2015-06-04 NOTE — Patient Instructions (Signed)
Preventive Care for Adults A healthy lifestyle and preventive care can promote health and wellness. Preventive health guidelines for men include the following key practices:  A routine yearly physical is a good way to check with your health care provider about your health and preventative screening. It is a chance to share any concerns and updates on your health and to receive a thorough exam.  Visit your dentist for a routine exam and preventative care every 6 months. Brush your teeth twice a day and floss once a day. Good oral hygiene prevents tooth decay and gum disease.  The frequency of eye exams is based on your age, health, family medical history, use of contact lenses, and other factors. Follow your health care provider's recommendations for frequency of eye exams.  Eat a healthy diet. Foods such as vegetables, fruits, whole grains, low-fat dairy products, and lean protein foods contain the nutrients you need without too many calories. Decrease your intake of foods high in solid fats, added sugars, and salt. Eat the right amount of calories for you.Get information about a proper diet from your health care provider, if necessary.  Regular physical exercise is one of the most important things you can do for your health. Most adults should get at least 150 minutes of moderate-intensity exercise (any activity that increases your heart rate and causes you to sweat) each week. In addition, most adults need muscle-strengthening exercises on 2 or more days a week.  Maintain a healthy weight. The body mass index (BMI) is a screening tool to identify possible weight problems. It provides an estimate of body fat based on height and weight. Your health care provider can find your BMI and can help you achieve or maintain a healthy weight.For adults 20 years and older:  A BMI below 18.5 is considered underweight.  A BMI of 18.5 to 24.9 is normal.  A BMI of 25 to 29.9 is considered overweight.  A BMI  of 30 and above is considered obese.  Maintain normal blood lipids and cholesterol levels by exercising and minimizing your intake of saturated fat. Eat a balanced diet with plenty of fruit and vegetables. Blood tests for lipids and cholesterol should begin at age 20 and be repeated every 5 years. If your lipid or cholesterol levels are high, you are over 50, or you are at high risk for heart disease, you may need your cholesterol levels checked more frequently.Ongoing high lipid and cholesterol levels should be treated with medicines if diet and exercise are not working.  If you smoke, find out from your health care provider how to quit. If you do not use tobacco, do not start.  Lung cancer screening is recommended for adults aged 55-80 years who are at high risk for developing lung cancer because of a history of smoking. A yearly low-dose CT scan of the lungs is recommended for people who have at least a 30-pack-year history of smoking and are a current smoker or have quit within the past 15 years. A pack year of smoking is smoking an average of 1 pack of cigarettes a day for 1 year (for example: 1 pack a day for 30 years or 2 packs a day for 15 years). Yearly screening should continue until the smoker has stopped smoking for at least 15 years. Yearly screening should be stopped for people who develop a health problem that would prevent them from having lung cancer treatment.  If you choose to drink alcohol, do not have more than   2 drinks per day. One drink is considered to be 12 ounces (355 mL) of beer, 5 ounces (148 mL) of wine, or 1.5 ounces (44 mL) of liquor.  Avoid use of street drugs. Do not share needles with anyone. Ask for help if you need support or instructions about stopping the use of drugs.  High blood pressure causes heart disease and increases the risk of stroke. Your blood pressure should be checked at least every 1-2 years. Ongoing high blood pressure should be treated with  medicines, if weight loss and exercise are not effective.  If you are 45-79 years old, ask your health care provider if you should take aspirin to prevent heart disease.  Diabetes screening involves taking a blood sample to check your fasting blood sugar level. Testing should be considered at a younger age or be carried out more frequently if you are overweight and have at least 1 risk factor for diabetes.  Colorectal cancer can be detected and often prevented. Most routine colorectal cancer screening begins at the age of 50 and continues through age 75. However, your health care provider may recommend screening at an earlier age if you have risk factors for colon cancer. On a yearly basis, your health care provider may provide home test kits to check for hidden blood in the stool. Use of a small camera at the end of a tube to directly examine the colon (sigmoidoscopy or colonoscopy) can detect the earliest forms of colorectal cancer. Talk to your health care provider about this at age 50, when routine screening begins. Direct exam of the colon should be repeated every 5-10 years through age 75, unless early forms of precancerous polyps or small growths are found.  Hepatitis C blood testing is recommended for all people born from 1945 through 1965 and any individual with known risks for hepatitis C.  Screening for abdominal aortic aneurysm (AAA)  by ultrasound is recommended for people who have history of high blood pressure or who are current or former smokers.  Healthy men should  receive prostate-specific antigen (PSA) blood tests as part of routine cancer screening. Talk with your health care provider about prostate cancer screening.  Testicular cancer screening is  recommended for adult males. Screening includes self-exam, a health care provider exam, and other screening tests. Consult with your health care provider about any symptoms you have or any concerns you have about testicular  cancer.  Use sunscreen. Apply sunscreen liberally and repeatedly throughout the day. You should seek shade when your shadow is shorter than you. Protect yourself by wearing long sleeves, pants, a wide-brimmed hat, and sunglasses year round, whenever you are outdoors.  Once a month, do a whole-body skin exam, using a mirror to look at the skin on your back. Tell your health care provider about new moles, moles that have irregular borders, moles that are larger than a pencil eraser, or moles that have changed in shape or color.  Stay current with required vaccines (immunizations).  Influenza vaccine. All adults should be immunized every year.  Tetanus, diphtheria, and acellular pertussis (Td, Tdap) vaccine. An adult who has not previously received Tdap or who does not know his vaccine status should receive 1 dose of Tdap. This initial dose should be followed by tetanus and diphtheria toxoids (Td) booster doses every 10 years. Adults with an unknown or incomplete history of completing a 3-dose immunization series with Td-containing vaccines should begin or complete a primary immunization series including a Tdap dose. Adults should   receive a Td booster every 10 years.  Zoster vaccine. One dose is recommended for adults aged 60 years or older unless certain conditions are present.    PREVNAR - Pneumococcal 13-valent conjugate (PCV13) vaccine. When indicated, a person who is uncertain of his immunization history and has no record of immunization should receive the PCV13 vaccine. An adult aged 19 years or older who has certain medical conditions and has not been previously immunized should receive 1 dose of PCV13 vaccine. This PCV13 should be followed with a dose of pneumococcal polysaccharide (PPSV23) vaccine. The PPSV23 vaccine dose should be obtained at least 8 weeks after the dose of PCV13 vaccine. An adult aged 19 years or older who has certain medical conditions and previously received 1 or more doses  of PPSV23 vaccine should receive 1 dose of PCV13. The PCV13 vaccine dose should be obtained 1 or more years after the last PPSV23 vaccine dose.    PNEUMOVAX - Pneumococcal polysaccharide (PPSV23) vaccine. When PCV13 is also indicated, PCV13 should be obtained first. All adults aged 65 years and older should be immunized. An adult younger than age 65 years who has certain medical conditions should be immunized. Any person who resides in a nursing home or long-term care facility should be immunized. An adult smoker should be immunized. People with an immunocompromised condition and certain other conditions should receive both PCV13 and PPSV23 vaccines. People with human immunodeficiency virus (HIV) infection should be immunized as soon as possible after diagnosis. Immunization during chemotherapy or radiation therapy should be avoided. Routine use of PPSV23 vaccine is not recommended for American Indians, [place] Natives, or people younger than 65 years unless there are medical conditions that require PPSV23 vaccine. When indicated, people who have unknown immunization and have no record of immunization should receive PPSV23 vaccine. One-time revaccination 5 years after the first dose of PPSV23 is recommended for people aged 19-64 years who have chronic kidney failure, nephrotic syndrome, asplenia, or immunocompromised conditions. People who received 1-2 doses of PPSV23 before age 65 years should receive another dose of PPSV23 vaccine at age 65 years or later if at least 5 years have passed since the previous dose. Doses of PPSV23 are not needed for people immunized with PPSV23 at or after age 65 years.    Hepatitis A vaccine. Adults who wish to be protected from this disease, have certain high-risk conditions, work with hepatitis A-infected animals, work in hepatitis A research labs, or travel to or work in countries with a high rate of hepatitis A should be immunized. Adults who were previously unvaccinated  and who anticipate close contact with an international adoptee during the first 60 days after arrival in the [place] from a country with a high rate of hepatitis A should be immunized.    Hepatitis B vaccine. Adults should be immunized if they wish to be protected from this disease, have certain high-risk conditions, may be exposed to blood or other infectious body fluids, are household contacts or sex partners of hepatitis B positive people, are clients or workers in certain care facilities, or travel to or work in countries with a high rate of hepatitis B.   Preventive Service / Frequency   Ages 65 and over  Blood pressure check.  Lipid and cholesterol check.  Lung cancer screening. / Every year if you are aged 55-80 years and have a 30-pack-year history of smoking and currently smoke or have quit within the past 15 years. Yearly screening is stopped once you   have quit smoking for at least 15 years or develop a health problem that would prevent you from having lung cancer treatment.  Fecal occult blood test (FOBT) of stool. You may not have to do this test if you get a colonoscopy every 10 years.  Flexible sigmoidoscopy** or colonoscopy.** / Every 5 years for a flexible sigmoidoscopy or every 10 years for a colonoscopy beginning at age 50 and continuing until age 75.  Hepatitis C blood test.** / For all people born from 1945 through 1965 and any individual with known risks for hepatitis C.  Abdominal aortic aneurysm (AAA) screening./ Screening current or former smokers or have Hypertension.  Skin self-exam. / Monthly.  Influenza vaccine. / Every year.  Tetanus, diphtheria, and acellular pertussis (Tdap/Td) vaccine.** / 1 dose of Td every 10 years.   Zoster vaccine.** / 1 dose for adults aged 60 years or older.         Pneumococcal 13-valent conjugate (PCV13) vaccine.    Pneumococcal polysaccharide (PPSV23) vaccine.     Hepatitis A vaccine.** / Consult your health  care provider.  Hepatitis B vaccine.** / Consult your health care provider. Screening for abdominal aortic aneurysm (AAA)  by ultrasound is recommended for people who have history of high blood pressure or who are current or former smokers. 

## 2015-06-04 NOTE — Progress Notes (Signed)
Patient ID: Joshua Higgins, male   DOB: [birth year], 69 y.o.   MRN: [id number]   [facility] VISIT AND CPE  Assessment:    1. Essential hypertension -Aortascan mildly abnormal and greater than 3 cm.  Will let patient know with lab results.  May need formal ultrasound for reassurance.   -also followed by Dr. Benn Higgins - Urinalysis, Routine w reflex microscopic (not at Joshua Higgins) - Microalbumin / creatinine urine ratio - EKG 12-Lead - [place], RETROPERITNL ABD,  LTD - TSH  2. Type 2 diabetes mellitus with diabetic nephropathy, without long-term current use of insulin (HCC) -cont meds - Hemoglobin A1c - Insulin, random  3. Hyperlipidemia -at goal -cont meds - Lipid panel  4. Vitamin D deficiency -cont supplement - VITAMIN D 25 Hydroxy (Vit-D Deficiency, Fractures)  5. Gastroesophageal reflux disease, esophagitis presence not specified -appears to be well controlled at this time  6. IBS (irritable bowel syndrome) -currently well controlled  7. Medication management  - CBC with Differential/Platelet - BASIC METABOLIC PANEL WITH GFR - Hepatic function panel - Magnesium  8. Encounter for Medicare annual wellness exam   9. Screening for prostate cancer -defer exam to Dr. Melford Higgins at patients request - PSA  10. Screening for rectal cancer -order cologuard - POC Hemoccult Bld/Stl (3-Cd Home Screen); Future  11. Deficiency anemia  - Iron and TIBC - Vitamin B12  12. Testosterone deficiency  - Testosterone     Over 40 minutes of exam, counseling, chart review and critical decision making was performed  Plan:   During the course of the visit the patient was educated and counseled about appropriate screening and preventive services including:    Pneumococcal vaccine  Prevnar 13   Influenza vaccine  Td vaccine  Screening electrocardiogram  Bone densitometry screening  Colorectal cancer screening  Diabetes screening  Glaucoma  screening  Nutrition counseling   Advanced directives: requested  Conditions/risks identified: BMI: Discussed weight loss, diet, and increase physical activity.  Increase physical activity: AHA recommends 150 minutes of physical activity a week.  Medications reviewed Diabetes is at goal, ACE/ARB therapy: Yes. Urinary Incontinence is not an issue: discussed non pharmacology and pharmacology options.  Fall risk: low- discussed PT, home fall assessment, medications.    Subjective:  Joshua Higgins is a 69 y.o. male who presents for Medicare Annual Wellness Visit and complete physical.  Date of last medicare wellness visit is unknown.  He has had elevated blood pressure for many years . His blood pressure has been controlled at home, today their BP is BP: 134/66 mmHg He does workout. He denies chest pain, shortness of breath, dizziness.  He is on cholesterol medication and denies myalgias. His cholesterol is at goal. The cholesterol last visit was:   Lab Results  Component Value Date   CHOL 129 03/04/2015   HDL 39* 03/04/2015   LDLCALC 66 03/04/2015   TRIG 119 03/04/2015   CHOLHDL 3.3 03/04/2015   He has had diabetes for several years. He has been working on diet and exercise for diabetes, and denies foot ulcerations, hyperglycemia, hypoglycemia , increased appetite, nausea, paresthesia of the feet, polydipsia, polyuria, visual disturbances, vomiting and weight loss. Last A1C in the office was:  Lab Results  Component Value Date   HGBA1C 7.0* 03/04/2015   Last GFR: NonAA   Lab Results  Component Value Date   GFRNONAA 75 03/04/2015   AA  Lab Results  Component Value Date   GFRAA 87 03/04/2015   Patient is  on Vitamin D supplement.   Lab Results  Component Value Date   VD25OH 75 03/04/2015      Medication Review: Current Outpatient Prescriptions on File Prior to Visit  Medication Sig Dispense Refill  . acetaminophen (TYLENOL) 500 MG tablet Take 1,000 mg by mouth every  4 (four) hours as needed.    Marland Kitchen acyclovir (ZOVIRAX) 800 MG tablet Take 800 mg by mouth 3 (three) times daily as needed (fever blister).    Marland Kitchen aspirin 325 MG tablet Take 325 mg by mouth at bedtime.     Marland Kitchen atenolol (TENORMIN) 50 MG tablet TAKE 1 TABLET BY MOUTH DAILY 90 tablet 3  . Cholecalciferol 5000 UNITS TABS Take 1 tablet by mouth daily.    Marland Kitchen ibuprofen (ADVIL,MOTRIN) 100 MG tablet Take 400 mg by mouth every 6 (six) hours as needed for fever.    . IRON CR PO Take 1 tablet by mouth daily.    Marland Kitchen levothyroxine (SYNTHROID, LEVOTHROID) 50 MCG tablet TAKE 1 TABLET BY MOUTH DAILY 90 tablet 0  . metFORMIN (GLUCOPHAGE XR) 500 MG 24 hr tablet Take 1 to 2 tablets  2 x day as directed for Diabetes. 360 tablet 99  . Multiple Vitamin (MULTIVITAMIN) tablet Take 1 tablet by mouth at bedtime.     . Omega-3 Fatty Acids (FISH OIL) 1000 MG CAPS Take 1,000 mg by mouth at bedtime.     . rosuvastatin (CRESTOR) 20 MG tablet Take 20 mg by mouth daily.    . sildenafil (REVATIO) 20 MG tablet Take 1 tablet (20 mg total) by mouth 3 (three) times daily. 30 tablet 0  . valsartan (DIOVAN) 80 MG tablet TAKE 1/2-1 TABLET BY MOUTH DAILY FOR BLOOD PRESSURE AND KIDNEY PROTECTION 30 tablet 0   No current facility-administered medications on file prior to visit.    Current Problems (verified) Patient Active Problem List   Diagnosis Date Noted  . Encounter for Medicare annual wellness exam 03/04/2015  . Medication management 09/24/2013  . Vitamin D deficiency 05/27/2013  . [name] w/Stage 2 CKD (GFR 73 ml/min)   . Hyperlipidemia   . Hypertension   . GERD (gastroesophageal reflux disease)   . IBS (irritable bowel syndrome)     Screening Tests Immunization History  Administered Date(s) Administered  . DT 08/29/2003  . Influenza-Unspecified 04/16/2014  . Pneumococcal Conjugate-13 12/26/2013  . Pneumococcal Polysaccharide-23 01/29/2010  . Zoster 05/11/2010    Preventative care: Last colonoscopy: cologuard ordered  today  Prior vaccinations: TD or Tdap: Given today  Influenza: 04/2015  Pneumococcal: 2011 Prevnar13: 2015 Shingles/Zostavax: 2011  Names of Other Physician/Practitioners you currently use: 1. [facility] here for primary care 2. Gershon Crane, eye doctor, last visit 1/16 3. Dr. Isac Caddy, dentist, last visit q6hrs Patient Care Team: Unk Pinto, MD as PCP - General (Internal Medicine) Rutherford Guys, MD as Consulting Physician (Ophthalmology) Kristeen Miss, MD as Consulting Physician (Neurosurgery) Sanda Klein, MD as Consulting Physician (Cardiology) Teena Irani, MD as Consulting Physician (Gastroenterology)  Past Surgical History  Procedure Laterality Date  . Cardiac surgery      Bypass  . Knee surgery Right     arthroscopic  . Back surgery    . Coronary angioplasty  2001  . Coronary artery bypass graft    . Surgery scrotal / testicular      as a child/teenager  . Elbow surgery Left     due to damaged nerve  . Colonoscopy    . Lumbar laminectomy/decompression microdiscectomy Right 08/04/2014  Procedure: Right Lumbar four-five Microdiskectomy;  Surgeon: Kristeen Miss, MD;  Location: [place] [facility];  Service: Neurosurgery;  Laterality: Right;  Right L4-5 Microdiskectomy   Family History  Problem Relation Age of Onset  . Ulcers Mother   . Arthritis Mother   . Heart attack Father   . CAD Sister   . Heart attack Brother   . CAD Brother   . Hypertension Brother   . Diabetes Maternal Grandmother    Social History  Substance Use Topics  . Smoking status: Former Smoker    Quit date: 10/01/1973  . Smokeless tobacco: Never Used  . Alcohol Use: No    MEDICARE WELLNESS OBJECTIVES: Tobacco use: He does smoke.  Patient is a former smoker. If yes, counseling given Alcohol Current alcohol use: none, minimal use of any alcohol Osteoporosis: hypogonadism, History of fracture in the past year: no Fall risk: Minimal risk Hearing:  normal Visual acuity: normal,  does perform annual eye exam Diet: in general, a "healthy" diet   Physical activity: Current Exercise Habits:: Home exercise routine, Type of exercise: walking (does a lot of farming and gardening), Time (Minutes): 45, Frequency (Times/Week): 6, Weekly Exercise (Minutes/Week): 270, Intensity: Moderate Cardiac risk factors: Cardiac Risk Factors include: advanced age (>63men, >42 women);diabetes mellitus;dyslipidemia;family history of premature cardiovascular disease;hypertension;male gender Depression/mood screen:   Depression screen [facility] 2/9 06/04/2015  Decreased Interest 0  Down, Depressed, Hopeless 0  PHQ - 2 Score 0    ADLs:  In your present state of health, do you have any difficulty performing the following activities: 06/04/2015 07/30/2014  Hearing? N N  Vision? N N  Difficulty concentrating or making decisions? N N  Walking or climbing stairs? N Y  Dressing or bathing? N N  Doing errands, shopping? N -  Preparing Food and eating ? N -  Using the Toilet? N -  In the past six months, have you accidently leaked urine? N -  Do you have problems with loss of bowel control? N -  Managing your Medications? N -  Managing your Finances? N -  Housekeeping or managing your Housekeeping? N -     Cognitive Testing  Alert? Yes  Normal Appearance?Yes  Oriented to person? Yes  Place? Yes   Time? Yes  Recall of three objects?  Yes  Can perform simple calculations? Yes  Displays appropriate judgment?Yes  Can read the correct time from a watch face?Yes  EOL planning: Does patient have an advance directive?: Yes Type of Advance Directive: Living will Does patient want to make changes to advanced directive?: No - Patient declined Copy of advanced directive(s) in chart?: Yes  Review of Systems  Constitutional: Negative for fever, chills and malaise/fatigue.  HENT: Negative for congestion, ear pain and sore throat.   Respiratory: Negative for cough, shortness  of breath and wheezing.   Cardiovascular: Negative for chest pain, palpitations and leg swelling.  Gastrointestinal: Positive for heartburn. Negative for diarrhea, constipation, blood in stool and melena.  Genitourinary: Negative.   Skin: Negative.   Neurological: Negative for dizziness, sensory change, loss of consciousness and headaches.  Psychiatric/Behavioral: Negative for depression. The patient is not nervous/anxious and does not have insomnia.      Objective:     Today's Vitals   06/04/15 0947  BP: 134/66  Pulse: 72  Temp: 97.3 F (36.3 C)  Resp: 16  Height: 5\' 9"  (1.753 m)  Weight: 191 lb 3.2 oz (86.728 kg)   Body mass index is 28.22 kg/(m^2).  General  appearance: alert, no distress, WD/WN, male HEENT: normocephalic, sclerae anicteric, TMs pearly, nares patent, no discharge or erythema, pharynx normal Oral cavity: MMM, no lesions Neck: supple, no lymphadenopathy, no thyromegaly, no masses Heart: RRR, normal S1, S2, no murmurs,well healed midline sternal scar from prior surgery Lungs: CTA bilaterally, no wheezes, rhonchi, or rales Abdomen: +bs, diastasis recti present, soft, non tender, non distended, no masses, no hepatomegaly, no splenomegaly Musculoskeletal: nontender, no swelling, no obvious deformity Extremities: no edema, no cyanosis, no clubbing Pulses: 2+ symmetric, upper and lower extremities, normal cap refill Neurological: alert, oriented x 3, CN2-12 intact, strength normal upper extremities and lower extremities, sensation normal throughout, DTRs 2+ throughout, no cerebellar signs, gait Normal Psychiatric: normal affect, behavior normal, pleasant   Medicare Attestation I have personally reviewed: The patient's medical and social history Their use of alcohol, tobacco or illicit drugs Their current medications and supplements The patient's functional ability including ADLs,fall risks, home safety risks, cognitive, and hearing and visual impairment Diet and  physical activities Evidence for depression or mood disorders  The patient's weight, height, BMI, and visual acuity have been recorded in the chart.  I have made referrals, counseling, and provided education to the patient based on review of the above and I have provided the patient with a written personalized care plan for preventive services.     Starlyn Skeans, PA-C   06/04/2015

## 2015-06-05 LAB — TESTOSTERONE: TESTOSTERONE: 184 ng/dL — AB (ref 300–890)

## 2015-06-05 LAB — URINALYSIS, ROUTINE W REFLEX MICROSCOPIC
BILIRUBIN URINE: NEGATIVE
Glucose, UA: NEGATIVE
Hgb urine dipstick: NEGATIVE
KETONES UR: NEGATIVE
Leukocytes, UA: NEGATIVE
Nitrite: NEGATIVE
PH: 6 (ref 5.0–8.0)
Protein, ur: NEGATIVE
SPECIFIC GRAVITY, URINE: 1.014 (ref 1.001–1.035)

## 2015-06-05 LAB — PSA: PSA: 0.64 ng/mL (ref <=4.00)

## 2015-06-05 LAB — MICROALBUMIN / CREATININE URINE RATIO
Creatinine, Urine: 114 mg/dL (ref 20–370)
Microalb Creat Ratio: 9 mcg/mg creat (ref <30)
Microalb, Ur: 1 mg/dL

## 2015-06-05 LAB — VITAMIN D 25 HYDROXY (VIT D DEFICIENCY, FRACTURES): Vit D, 25-Hydroxy: 72 ng/mL (ref 30–100)

## 2015-06-05 LAB — HEMOGLOBIN A1C
Hgb A1c MFr Bld: 6.6 % — ABNORMAL HIGH (ref <5.7)
MEAN PLASMA GLUCOSE: 143 mg/dL — AB (ref <117)

## 2015-06-05 LAB — VITAMIN B12: Vitamin B-12: 535 pg/mL (ref 211–911)

## 2015-06-05 LAB — INSULIN, RANDOM: INSULIN: 32.2 u[IU]/mL — AB (ref 2.0–19.6)

## 2015-06-05 LAB — TSH: TSH: 2.242 u[IU]/mL (ref 0.350–4.500)

## 2015-06-23 ENCOUNTER — Other Ambulatory Visit: Payer: Self-pay | Admitting: *Deleted

## 2015-06-23 DIAGNOSIS — Z1212 Encounter for screening for malignant neoplasm of rectum: Secondary | ICD-10-CM

## 2015-06-23 LAB — POC HEMOCCULT BLD/STL (HOME/3-CARD/SCREEN)
Card #2 Fecal Occult Blod, POC: NEGATIVE
FECAL OCCULT BLD: NEGATIVE
Fecal Occult Blood, POC: NEGATIVE

## 2015-06-23 MED ORDER — ACYCLOVIR 800 MG PO TABS: 800.0000 mg | ORAL_TABLET | Freq: Three times a day (TID) | ORAL | Status: DC | PRN

## 2015-06-25 ENCOUNTER — Other Ambulatory Visit: Payer: Self-pay | Admitting: Internal Medicine

## 2015-07-03 ENCOUNTER — Encounter: Payer: Self-pay | Admitting: *Deleted

## 2015-07-09 DIAGNOSIS — D044 Carcinoma in situ of skin of scalp and neck: Secondary | ICD-10-CM | POA: Diagnosis not present

## 2015-07-09 DIAGNOSIS — B078 Other viral warts: Secondary | ICD-10-CM | POA: Diagnosis not present

## 2015-07-09 DIAGNOSIS — L570 Actinic keratosis: Secondary | ICD-10-CM | POA: Diagnosis not present

## 2015-07-09 DIAGNOSIS — D485 Neoplasm of uncertain behavior of skin: Secondary | ICD-10-CM | POA: Diagnosis not present

## 2015-07-21 ENCOUNTER — Other Ambulatory Visit: Payer: Self-pay | Admitting: Internal Medicine

## 2015-08-12 ENCOUNTER — Ambulatory Visit: Payer: Medicare Other | Admitting: Physician Assistant

## 2015-08-12 ENCOUNTER — Encounter: Payer: Self-pay | Admitting: Physician Assistant

## 2015-08-12 VITALS — BP 118/76 | HR 69 | Temp 97.7°F | Resp 16 | Ht 69.0 in | Wt 191.0 lb

## 2015-08-12 DIAGNOSIS — I10 Essential (primary) hypertension: Secondary | ICD-10-CM

## 2015-08-12 NOTE — Progress Notes (Signed)
Too early for labs

## 2015-08-12 NOTE — Patient Instructions (Signed)
9 Ways to Naturally Increase Testosterone Levels  1.   Lose Weight If you're overweight, shedding the excess pounds may increase your testosterone levels, according to research presented at the Endocrine Society's 2012 meeting. Overweight men are more likely to have low testosterone levels to begin with, so this is an important trick to increase your body's testosterone production when you need it most.  2.   High-Intensity Exercise like Peak Fitness  Short intense exercise has a proven positive effect on increasing testosterone levels and preventing its decline. That's unlike aerobics or prolonged moderate exercise, which have shown to have negative or no effect on testosterone levels. Having a whey protein meal after exercise can further enhance the satiety/testosterone-boosting impact (hunger hormones cause the opposite effect on your testosterone and libido). Here's a summary of what a typical high-intensity Peak Fitness routine might look like: " Warm up for three minutes  " Exercise as hard and fast as you can for 30 seconds. You should feel like you couldn't possibly go on another few seconds  " Recover at a slow to moderate pace for 90 seconds  " Repeat the high intensity exercise and recovery 7 more times .  3.   Consume Plenty of Zinc The mineral zinc is important for testosterone production, and supplementing your diet for as little as six weeks has been shown to cause a marked improvement in testosterone among men with low levels.1 Likewise, research has shown that restricting dietary sources of zinc leads to a significant decrease in testosterone, while zinc supplementation increases it2 -- and even protects men from exercised-induced reductions in testosterone levels.3 It's estimated that up to 45 percent of adults over the age of 60 may have lower than recommended zinc intakes; even when dietary supplements were added in, an estimated 20-25 percent of older adults still had inadequate  zinc intakes, according to a National Health and Nutrition Examination Survey.4 Your diet is the best source of zinc; along with protein-rich foods like meats and fish, other good dietary sources of zinc include raw milk, raw cheese, beans, and yogurt or kefir made from raw milk. It can be difficult to obtain enough dietary zinc if you're a vegetarian, and also for meat-eaters as well, largely because of conventional farming methods that rely heavily on chemical fertilizers and pesticides. These chemicals deplete the soil of nutrients ... nutrients like zinc that must be absorbed by plants in order to be passed on to you. In many cases, you may further deplete the nutrients in your food by the way you prepare it. For most food, cooking it will drastically reduce its levels of nutrients like zinc . particularly over-cooking, which many people do. If you decide to use a zinc supplement, stick to a dosage of less than 40 mg a day, as this is the recommended adult upper limit. Taking too much zinc can interfere with your body's ability to absorb other minerals, especially copper, and may cause nausea as a side effect.  4.   Strength Training In addition to Peak Fitness, strength training is also known to boost testosterone levels, provided you are doing so intensely enough. When strength training to boost testosterone, you'll want to increase the weight and lower your number of reps, and then focus on exercises that work a large number of muscles, such as dead lifts or squats.  You can "turbo-charge" your weight training by going slower. By slowing down your movement, you're actually turning it into a high-intensity exercise. Super Slow   movement allows your muscle, at the microscopic level, to access the maximum number of cross-bridges between the protein filaments that produce movement in the muscle.   5.   Optimize Your Vitamin D Levels Vitamin D, a steroid hormone, is essential for the healthy development  of the nucleus of the sperm cell, and helps maintain semen quality and sperm count. Vitamin D also increases levels of testosterone, which may boost libido. In one study, overweight men who were given vitamin D supplements had a significant increase in testosterone levels after one year.5   6.   Reduce Stress When you're under a lot of stress, your body releases high levels of the stress hormone cortisol. This hormone actually blocks the effects of testosterone,6 presumably because, from a biological standpoint, testosterone-associated behaviors (mating, competing, aggression) may have lowered your chances of survival in an emergency (hence, the "fight or flight" response is dominant, courtesy of cortisol).  7.   Limit or Eliminate Sugar from Your Diet Testosterone levels decrease after you eat sugar, which is likely because the sugar leads to a high insulin level, another factor leading to low testosterone.7 Based on USDA estimates, the average American consumes 12 teaspoons of sugar a day, which equates to about TWO TONS of sugar during a lifetime.  8.   Eat Healthy Fats By healthy, this means not only mon- and polyunsaturated fats, like that found in avocadoes and nuts, but also saturated, as these are essential for building testosterone. Research shows that a diet with less than 40 percent of energy as fat (and that mainly from animal sources, i.e. saturated) lead to a decrease in testosterone levels.8 My personal diet is about 60-70 percent healthy fat, and other experts agree that the ideal diet includes somewhere between 50-70 percent fat.  It's important to understand that your body requires saturated fats from animal and vegetable sources (such as meat, dairy, certain oils, and tropical plants like coconut) for optimal functioning, and if you neglect this important food group in favor of sugar, grains and other starchy carbs, your health and weight are almost guaranteed to suffer. Examples of  healthy fats you can eat more of to give your testosterone levels a boost include: Olives and Olive oil  Coconuts and coconut oil Butter made from raw grass-fed organic milk Raw nuts, such as, almonds or pecans Organic pastured egg yolks Avocados Grass-fed meats Palm oil Unheated organic nut oils   9.   Boost Your Intake of Branch Chain Amino Acids (BCAA) from Foods Like Whey Protein Research suggests that BCAAs result in higher testosterone levels, particularly when taken along with resistance training.9 While BCAAs are available in supplement form, you'll find the highest concentrations of BCAAs like leucine in dairy products - especially quality cheeses and whey protein. Even when getting leucine from your natural food supply, it's often wasted or used as a building block instead of an anabolic agent. So to create the correct anabolic environment, you need to boost leucine consumption way beyond mere maintenance levels. That said, keep in mind that using leucine as a free form amino acid can be highly counterproductive as when free form amino acids are artificially administrated, they rapidly enter your circulation while disrupting insulin function, and impairing your body's glycemic control. Food-based leucine is really the ideal form that can benefit your muscles without side effects.   

## 2015-08-13 ENCOUNTER — Other Ambulatory Visit: Payer: Self-pay | Admitting: Internal Medicine

## 2015-08-19 DIAGNOSIS — L570 Actinic keratosis: Secondary | ICD-10-CM | POA: Diagnosis not present

## 2015-09-14 ENCOUNTER — Encounter: Payer: Self-pay | Admitting: *Deleted

## 2015-09-25 ENCOUNTER — Ambulatory Visit: Payer: 59 | Admitting: Cardiovascular Disease

## 2015-09-28 ENCOUNTER — Encounter: Payer: Self-pay | Admitting: Cardiovascular Disease

## 2015-09-28 ENCOUNTER — Ambulatory Visit (INDEPENDENT_AMBULATORY_CARE_PROVIDER_SITE_OTHER): Payer: Medicare Other | Admitting: Cardiovascular Disease

## 2015-09-28 VITALS — BP 144/74 | HR 70 | Ht 70.5 in | Wt 190.2 lb

## 2015-09-28 DIAGNOSIS — I25708 Atherosclerosis of coronary artery bypass graft(s), unspecified, with other forms of angina pectoris: Secondary | ICD-10-CM

## 2015-09-28 DIAGNOSIS — E1121 Type 2 diabetes mellitus with diabetic nephropathy: Secondary | ICD-10-CM | POA: Diagnosis not present

## 2015-09-28 DIAGNOSIS — I10 Essential (primary) hypertension: Secondary | ICD-10-CM

## 2015-09-28 DIAGNOSIS — R0789 Other chest pain: Secondary | ICD-10-CM | POA: Diagnosis not present

## 2015-09-28 DIAGNOSIS — E785 Hyperlipidemia, unspecified: Secondary | ICD-10-CM | POA: Diagnosis not present

## 2015-09-28 NOTE — Progress Notes (Signed)
Patient ID: Joshua Higgins, male   DOB: [birth year], 70 y.o.   MRN: [id number]    Cardiology Office Note    Date:  09/29/2015   ID:  Joshua Higgins, DOB 12-02-1945, MRN [id number]  PCP:  Joshua Richards, MD  Cardiologist:   Sanda Klein, MD   Chief Complaint  Patient presents with  . Follow-up    Hyperlipidemia--last ov 2015  pt c/o chest discomfort when going up stairs--goes away quickly; dizziness when he stands up; no other Sx.    History of Present Illness:  Joshua Higgins is a 70 y.o. male who is now roughly 9 years after coronary bypass surgery (LIMA to LAD, SVG to ramus intermedius and OM, SVG to acute marginal branch of RCA), and also has a history of hyperlipidemia, hypertension, type 2 diabetes mellitus controlled with oral antidiabetics, erectile dysfunction, migraines.  He describes occasional exertional chest discomfort that occurs when he climbs stairs in a hurry. It resolves very promptly when he stops. This has been present now for at least a year without change in intensity or frequency. He has slight orthostatic dizziness but denies syncope or palpitations. Has not had exertional dyspnea, leg edema, claudication or focal neurological complaints.  Last functional study was a normal nuclear stress test in 2013  Past Medical History  Diagnosis Date  . Diabetes mellitus without complication ([place])     type II  . Hyperlipidemia   . GERD (gastroesophageal reflux disease)   . Coronary artery disease     CABG - 2008  . Migraine headache with aura     takes Atenolol  . Sleep apnea     does not use cpap  . Hypothyroidism   . Arthritis   . Cancer ([place])     skin cancer on face  . Anemia     low iron  . Hepatitis   . Cataracts, bilateral   . Hypertension     pt states he does not have HTN, takes Atenolol for migraines    Past Surgical History  Procedure Laterality Date  . Cardiac surgery      Bypass  . Knee surgery Right     arthroscopic  . Back  surgery    . Coronary angioplasty  2001  . Coronary artery bypass graft    . Surgery scrotal / testicular      as a child/teenager  . Elbow surgery Left     due to damaged nerve  . Colonoscopy    . Lumbar laminectomy/decompression microdiscectomy Right 08/04/2014    Procedure: Right Lumbar four-five Microdiskectomy;  Surgeon: Kristeen Miss, MD;  Location: [facility];  Service: Neurosurgery;  Laterality: Right;  Right L4-5 Microdiskectomy    Outpatient Prescriptions Prior to Visit  Medication Sig Dispense Refill  . acetaminophen (TYLENOL) 500 MG tablet Take 1,000 mg by mouth every 4 (four) hours as needed.    Marland Kitchen acyclovir (ZOVIRAX) 800 MG tablet Take 1 tablet (800 mg total) by mouth 3 (three) times daily as needed (fever blister). 90 tablet 0  . aspirin 325 MG tablet Take 325 mg by mouth at bedtime.     Marland Kitchen atenolol (TENORMIN) 50 MG tablet TAKE 1 TABLET BY MOUTH DAILY 90 tablet 3  . Cholecalciferol 5000 UNITS TABS Take 1 tablet by mouth daily.    Marland Kitchen ibuprofen (ADVIL,MOTRIN) 100 MG tablet Take 400 mg by mouth every 6 (six) hours as needed for fever.    . IRON CR PO Take 1 tablet by mouth daily.    Marland Kitchen  metFORMIN (GLUCOPHAGE XR) 500 MG 24 hr tablet Take 1 to 2 tablets  2 x day as directed for Diabetes. 360 tablet 99  . Multiple Vitamin (MULTIVITAMIN) tablet Take 1 tablet by mouth at bedtime.     . Omega-3 Fatty Acids (FISH OIL) 1000 MG CAPS Take 1,000 mg by mouth at bedtime.     . rosuvastatin (CRESTOR) 20 MG tablet Take 20 mg by mouth daily.    . sildenafil (REVATIO) 20 MG tablet Take 1 tablet (20 mg total) by mouth 3 (three) times daily. 30 tablet 0  . valsartan (DIOVAN) 80 MG tablet TAKE 1/2 TO 1 TABLET BY MOUTH EVERY DAY FOR BLOOD PRESSURE AND KIDNEY PROTECTION 90 tablet 1  . levothyroxine (SYNTHROID, LEVOTHROID) 50 MCG tablet TAKE 1 TABLET BY MOUTH DAILY 90 tablet 1  . metFORMIN (GLUCOPHAGE) 500 MG tablet TAKE 4 TABLETS BY MOUTH EVERY DAY 360 tablet 0   No facility-administered medications  prior to visit.     Allergies:   Ace inhibitors and Lipitor   Social History   Social History  . Marital Status: Married    Spouse Name: N/A  . Number of Children: N/A  . Years of Education: N/A   Social History Main Topics  . Smoking status: Former Smoker    Quit date: 10/01/1973  . Smokeless tobacco: Never Used  . Alcohol Use: No  . Drug Use: No  . Sexual Activity: Not Asked   Other Topics Concern  . None   Social History Narrative     Family History:  The patient's family history includes Arthritis in his mother; CAD in his brother and sister; Diabetes in his maternal grandmother; Heart attack in his brother and father; Hypertension in his brother; Ulcers in his mother.   ROS:   Please see the history of present illness.    ROS All other systems reviewed and are negative.   PHYSICAL EXAM:   VS:  BP 144/74 mmHg  Pulse 70  Ht 5' 10.5" (1.791 m)  Wt 86.274 kg (190 lb 3.2 oz)  BMI 26.90 kg/m2   GEN: Well nourished, well developed, in no acute distress HEENT: normal Neck: no JVD, carotid bruits, or masses Cardiac: RRR; no murmurs, rubs, or gallops,no edema  Respiratory:  clear to auscultation bilaterally, normal work of breathing GI: soft, nontender, nondistended, + BS MS: no deformity or atrophy Skin: warm and dry, no rash Neuro:  Alert and Oriented x 3, Strength and sensation are intact Psych: euthymic mood, full affect  Wt Readings from Last 3 Encounters:  09/28/15 86.274 kg (190 lb 3.2 oz)  08/12/15 86.637 kg (191 lb)  06/04/15 86.728 kg (191 lb 3.2 oz)      Studies/Labs Reviewed:   EKG:  EKG is ordered today.  The ekg ordered today demonstrates normal sinus rhythm with minor nonspecific ST-T changes in the inferior and lateral leads, not much change from previous tracings  Recent Labs: 06/04/2015: ALT 24; BUN 12; Creat 0.90; Hemoglobin 13.6; Magnesium 1.9; Platelets 176; Potassium 4.0; Sodium 139; TSH 2.242   Lipid Panel    Component Value  Date/Time   CHOL 128 06/04/2015 1010   TRIG 131 06/04/2015 1010   HDL 38* 06/04/2015 1010   CHOLHDL 3.4 06/04/2015 1010   VLDL 26 06/04/2015 1010   LDLCALC 64 06/04/2015 1010      ASSESSMENT:    1. Coronary artery disease involving coronary bypass graft of native heart with other forms of angina pectoris ([place])   2. Essential  hypertension   3. Type 2 diabetes mellitus with diabetic nephropathy, without long-term current use of insulin ([place])   4. Hyperlipidemia      PLAN:  In order of problems listed above:  1. CAD s/p CABG: He has CCS class II exertional angina pectoris in a stable pattern, recurrent for the first time since his bypass procedure. The symptoms are quite mild. He is taking beta blockers. He cannot take nitrates since he uses sildenafil occasionally. Will risk stratify with a nuclear stress test. Consider adding a calcium channel blocker. 2. HTN: When checked at home typically has systolic blood pressures in the 120-130 range. No changes made to his medications today. His beta blocker has also been useful for migraine or tension. 3. DM: Well controlled, most recent hemoglobin A1c 6.6%. On valsartan for renal protection 4. HLP: LDL in target range on statin therapy    Medication Adjustments/Labs and Tests Ordered: Current medicines are reviewed at length with the patient today.  Concerns regarding medicines are outlined above.  Medication changes, Labs and Tests ordered today are listed in the Patient Instructions below. Patient Instructions  Dr Sallyanne Kuster recommends that you continue on your current medications as directed. Please refer to the Current Medication list given to you today.  Your physician has requested that you have an exercise tolerance test. For further information please visit [email or web address]. Please also follow instruction sheet, as given.  Dr Sallyanne Kuster recommends that you schedule a follow-up appointment in 1 year. You will receive a reminder  letter in the mail two months in advance. If you don't receive a letter, please call our office to schedule the follow-up appointment.  If you need a refill on your cardiac medications before your next appointment, please call your pharmacy.      Mikael Spray, MD  09/29/2015 10:05 AM    [facility] [address], [place], [place]  [address] Phone: [phone]; Fax: [phone]

## 2015-09-28 NOTE — Patient Instructions (Signed)
Dr Croitoru recommends that you continue on your current medications as directed. Please refer to the Current Medication list given to you today.  Your physician has requested that you have an exercise tolerance test. For further information please visit [email or web address]. Please also follow instruction sheet, as given.  Dr Croitoru recommends that you schedule a follow-up appointment in 1 year. You will receive a reminder letter in the mail two months in advance. If you don't receive a letter, please call our office to schedule the follow-up appointment.  If you need a refill on your cardiac medications before your next appointment, please call your pharmacy. 

## 2015-09-29 ENCOUNTER — Ambulatory Visit (HOSPITAL_COMMUNITY)
Admission: RE | Admit: 2015-09-29 | Discharge: 2015-09-29 | Disposition: A | Discharge status: Home / Self Care | Payer: Medicare Other | Source: Ambulatory Visit | Attending: Internal Medicine | Admitting: Internal Medicine

## 2015-09-29 DIAGNOSIS — I25810 Atherosclerosis of coronary artery bypass graft(s) without angina pectoris: Secondary | ICD-10-CM | POA: Insufficient documentation

## 2015-09-29 DIAGNOSIS — R9439 Abnormal result of other cardiovascular function study: Secondary | ICD-10-CM | POA: Diagnosis not present

## 2015-09-29 DIAGNOSIS — R0789 Other chest pain: Secondary | ICD-10-CM | POA: Insufficient documentation

## 2015-09-29 LAB — EXERCISE TOLERANCE TEST
CHL CUP MPHR: 149 {beats}/min
CHL CUP RESTING HR STRESS: 74 {beats}/min
CHL RATE OF PERCEIVED EXERTION: 17
CSEPHR: 94 %
Estimated workload: 7.8 METS
Exercise duration (min): 6 min
Exercise duration (sec): 34 s
Peak HR: 141 {beats}/min

## 2015-10-01 DIAGNOSIS — D044 Carcinoma in situ of skin of scalp and neck: Secondary | ICD-10-CM | POA: Diagnosis not present

## 2015-10-01 DIAGNOSIS — L98499 Non-pressure chronic ulcer of skin of other sites with unspecified severity: Secondary | ICD-10-CM | POA: Diagnosis not present

## 2015-10-10 ENCOUNTER — Emergency Department (HOSPITAL_COMMUNITY)
Admission: EM | Admit: 2015-10-10 | Discharge: 2015-10-10 | Disposition: A | Discharge status: Home / Self Care | Payer: No Typology Code available for payment source | Attending: Emergency Medicine | Admitting: Emergency Medicine

## 2015-10-10 ENCOUNTER — Emergency Department (HOSPITAL_COMMUNITY): Payer: No Typology Code available for payment source

## 2015-10-10 ENCOUNTER — Encounter (HOSPITAL_COMMUNITY): Payer: Self-pay | Admitting: Emergency Medicine

## 2015-10-10 DIAGNOSIS — Z87891 Personal history of nicotine dependence: Secondary | ICD-10-CM | POA: Diagnosis not present

## 2015-10-10 DIAGNOSIS — E119 Type 2 diabetes mellitus without complications: Secondary | ICD-10-CM | POA: Diagnosis not present

## 2015-10-10 DIAGNOSIS — S29001A Unspecified injury of muscle and tendon of front wall of thorax, initial encounter: Secondary | ICD-10-CM | POA: Insufficient documentation

## 2015-10-10 DIAGNOSIS — Z8719 Personal history of other diseases of the digestive system: Secondary | ICD-10-CM | POA: Diagnosis not present

## 2015-10-10 DIAGNOSIS — E785 Hyperlipidemia, unspecified: Secondary | ICD-10-CM | POA: Insufficient documentation

## 2015-10-10 DIAGNOSIS — I2510 Atherosclerotic heart disease of native coronary artery without angina pectoris: Secondary | ICD-10-CM | POA: Diagnosis not present

## 2015-10-10 DIAGNOSIS — Y998 Other external cause status: Secondary | ICD-10-CM | POA: Insufficient documentation

## 2015-10-10 DIAGNOSIS — E039 Hypothyroidism, unspecified: Secondary | ICD-10-CM | POA: Diagnosis not present

## 2015-10-10 DIAGNOSIS — Z9861 Coronary angioplasty status: Secondary | ICD-10-CM | POA: Diagnosis not present

## 2015-10-10 DIAGNOSIS — Z7984 Long term (current) use of oral hypoglycemic drugs: Secondary | ICD-10-CM | POA: Insufficient documentation

## 2015-10-10 DIAGNOSIS — D649 Anemia, unspecified: Secondary | ICD-10-CM | POA: Diagnosis not present

## 2015-10-10 DIAGNOSIS — Y92410 Unspecified street and highway as the place of occurrence of the external cause: Secondary | ICD-10-CM | POA: Insufficient documentation

## 2015-10-10 DIAGNOSIS — I10 Essential (primary) hypertension: Secondary | ICD-10-CM | POA: Diagnosis not present

## 2015-10-10 DIAGNOSIS — Z951 Presence of aortocoronary bypass graft: Secondary | ICD-10-CM | POA: Diagnosis not present

## 2015-10-10 DIAGNOSIS — R0789 Other chest pain: Secondary | ICD-10-CM | POA: Diagnosis not present

## 2015-10-10 DIAGNOSIS — Z85828 Personal history of other malignant neoplasm of skin: Secondary | ICD-10-CM | POA: Insufficient documentation

## 2015-10-10 DIAGNOSIS — H269 Unspecified cataract: Secondary | ICD-10-CM | POA: Insufficient documentation

## 2015-10-10 DIAGNOSIS — Z79899 Other long term (current) drug therapy: Secondary | ICD-10-CM | POA: Insufficient documentation

## 2015-10-10 DIAGNOSIS — Y9389 Activity, other specified: Secondary | ICD-10-CM | POA: Diagnosis not present

## 2015-10-10 DIAGNOSIS — M1990 Unspecified osteoarthritis, unspecified site: Secondary | ICD-10-CM | POA: Diagnosis not present

## 2015-10-10 DIAGNOSIS — S299XXA Unspecified injury of thorax, initial encounter: Secondary | ICD-10-CM | POA: Diagnosis not present

## 2015-10-10 DIAGNOSIS — Z8669 Personal history of other diseases of the nervous system and sense organs: Secondary | ICD-10-CM | POA: Insufficient documentation

## 2015-10-10 DIAGNOSIS — Z7982 Long term (current) use of aspirin: Secondary | ICD-10-CM | POA: Diagnosis not present

## 2015-10-10 LAB — CBC
HCT: 40.3 % (ref 39.0–52.0)
HEMOGLOBIN: 13.3 g/dL (ref 13.0–17.0)
MCH: 33 pg (ref 26.0–34.0)
MCHC: 33 g/dL (ref 30.0–36.0)
MCV: 100 fL (ref 78.0–100.0)
Platelets: 160 10*3/uL (ref 150–400)
RBC: 4.03 MIL/uL — AB (ref 4.22–5.81)
RDW: 12.7 % (ref 11.5–15.5)
WBC: 5 10*3/uL (ref 4.0–10.5)

## 2015-10-10 LAB — COMPREHENSIVE METABOLIC PANEL
ALBUMIN: 4 g/dL (ref 3.5–5.0)
ALK PHOS: 43 U/L (ref 38–126)
ALT: 27 U/L (ref 17–63)
ANION GAP: 11 (ref 5–15)
AST: 26 U/L (ref 15–41)
BUN: 18 mg/dL (ref 6–20)
CALCIUM: 9.5 mg/dL (ref 8.9–10.3)
CO2: 24 mmol/L (ref 22–32)
Chloride: 105 mmol/L (ref 101–111)
Creatinine, Ser: 0.97 mg/dL (ref 0.61–1.24)
GFR calc Af Amer: >60 mL/min (ref >60)
GFR calc non Af Amer: >60 mL/min (ref >60)
GLUCOSE: 220 mg/dL — AB (ref 65–99)
Potassium: 5 mmol/L (ref 3.5–5.1)
SODIUM: 140 mmol/L (ref 135–145)
Total Bilirubin: 0.7 mg/dL (ref 0.3–1.2)
Total Protein: 7 g/dL (ref 6.5–8.1)

## 2015-10-10 LAB — PROTIME-INR
INR: 1.1 (ref 0.00–1.49)
Prothrombin Time: 14.4 seconds (ref 11.6–15.2)

## 2015-10-10 LAB — I-STAT TROPONIN, ED
TROPONIN I, POC: 0 ng/mL (ref 0.00–0.08)
Troponin i, poc: 0 ng/mL (ref 0.00–0.08)

## 2015-10-10 MED ORDER — HYDROCODONE-ACETAMINOPHEN 5-325 MG PO TABS: 1.0000 | ORAL_TABLET | ORAL | Status: DC | PRN

## 2015-10-10 MED ORDER — SODIUM CHLORIDE 0.9 % IV SOLN
INTRAVENOUS | Status: DC
Start: 2015-10-10 — End: 2015-10-10
  Administered 2015-10-10: 12:00:00 via INTRAVENOUS

## 2015-10-10 MED ORDER — MORPHINE SULFATE (PF) 4 MG/ML IV SOLN
4.0000 mg | Freq: Once | INTRAVENOUS | Status: AC
  Administered 2015-10-10: 4 mg via INTRAVENOUS
  Filled 2015-10-10: qty 1

## 2015-10-10 NOTE — ED Notes (Signed)
Attempted 2 IV sticks, made relief RN aware at shift change.
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
## 2015-10-10 NOTE — ED Notes (Signed)
Dr. Knapp at bedside with patient. 

## 2015-10-10 NOTE — Discharge Instructions (Signed)
Chest Wall Pain °Chest wall pain is pain in or around the bones and muscles of your chest. Sometimes, an injury causes this pain. Sometimes, the cause may not be known. This pain may take several weeks or longer to get better. °HOME CARE °Pay attention to any changes in your symptoms. Take these actions to help with your pain: °· Rest as told by your doctor. °· Avoid activities that cause pain. Try not to use your chest, belly (abdominal), or side muscles to lift heavy things. °· If directed, apply ice to the painful area: °¨ Put ice in a plastic bag. °¨ Place a towel between your skin and the bag. °¨ Leave the ice on for 20 minutes, 2-3 times per day. °· Take over-the-counter and prescription medicines only as told by your doctor. °· Do not use tobacco products, including cigarettes, chewing tobacco, and e-cigarettes. If you need help quitting, ask your doctor. °· Keep all follow-up visits as told by your doctor. This is important. °GET HELP IF: °· You have a fever. °· Your chest pain gets worse. °· You have new symptoms. °GET HELP RIGHT AWAY IF: °· You feel sick to your stomach (nauseous) or you throw up (vomit). °· You feel sweaty or light-headed. °· You have a cough with phlegm (sputum) or you cough up blood. °· You are short of breath. °  °This information is not intended to replace advice given to you by your health care provider. Make sure you discuss any questions you have with your health care provider. °  °Document Released: 12/07/2007 Document Revised: 03/11/2015 Document Reviewed: 09/15/2014 °Elsevier Interactive Patient Education ©2016 [employer] Inc[employer] ° °

## 2015-10-15 DIAGNOSIS — E119 Type 2 diabetes mellitus without complications: Secondary | ICD-10-CM | POA: Diagnosis not present

## 2015-10-15 DIAGNOSIS — H2513 Age-related nuclear cataract, bilateral: Secondary | ICD-10-CM | POA: Diagnosis not present

## 2015-10-15 DIAGNOSIS — H40013 Open angle with borderline findings, low risk, bilateral: Secondary | ICD-10-CM | POA: Diagnosis not present

## 2015-11-12 ENCOUNTER — Encounter: Payer: Self-pay | Admitting: Internal Medicine

## 2015-11-12 ENCOUNTER — Ambulatory Visit (INDEPENDENT_AMBULATORY_CARE_PROVIDER_SITE_OTHER): Payer: Medicare Other | Admitting: Internal Medicine

## 2015-11-12 VITALS — BP 134/80 | HR 64 | Temp 97.7°F | Resp 16 | Ht 69.0 in | Wt 189.6 lb

## 2015-11-12 DIAGNOSIS — Z79899 Other long term (current) drug therapy: Secondary | ICD-10-CM

## 2015-11-12 DIAGNOSIS — N182 Chronic kidney disease, stage 2 (mild): Secondary | ICD-10-CM

## 2015-11-12 DIAGNOSIS — E039 Hypothyroidism, unspecified: Secondary | ICD-10-CM | POA: Diagnosis not present

## 2015-11-12 DIAGNOSIS — E785 Hyperlipidemia, unspecified: Secondary | ICD-10-CM | POA: Diagnosis not present

## 2015-11-12 DIAGNOSIS — I10 Essential (primary) hypertension: Secondary | ICD-10-CM

## 2015-11-12 DIAGNOSIS — E559 Vitamin D deficiency, unspecified: Secondary | ICD-10-CM | POA: Diagnosis not present

## 2015-11-12 DIAGNOSIS — E1122 Type 2 diabetes mellitus with diabetic chronic kidney disease: Secondary | ICD-10-CM

## 2015-11-12 DIAGNOSIS — E1129 Type 2 diabetes mellitus with other diabetic kidney complication: Secondary | ICD-10-CM | POA: Diagnosis not present

## 2015-11-12 LAB — CBC WITH DIFFERENTIAL/PLATELET
BASOS PCT: 0 %
Basophils Absolute: 0 cells/uL (ref 0–200)
EOS ABS: 192 {cells}/uL (ref 15–500)
EOS PCT: 4 %
HCT: 40.9 % (ref 38.5–50.0)
Hemoglobin: 13.6 g/dL (ref 13.2–17.1)
Lymphocytes Relative: 47 %
Lymphs Abs: 2256 cells/uL (ref 850–3900)
MCH: 33.1 pg — ABNORMAL HIGH (ref 27.0–33.0)
MCHC: 33.3 g/dL (ref 32.0–36.0)
MCV: 99.5 fL (ref 80.0–100.0)
MONOS PCT: 8 %
MPV: 9.7 fL (ref 7.5–12.5)
Monocytes Absolute: 384 cells/uL (ref 200–950)
NEUTROS ABS: 1968 {cells}/uL (ref 1500–7800)
Neutrophils Relative %: 41 %
PLATELETS: 183 10*3/uL (ref 140–400)
RBC: 4.11 MIL/uL — ABNORMAL LOW (ref 4.20–5.80)
RDW: 13.5 % (ref 11.0–15.0)
WBC: 4.8 10*3/uL (ref 3.8–10.8)

## 2015-11-12 LAB — HEPATIC FUNCTION PANEL
ALK PHOS: 46 U/L (ref 40–115)
ALT: 26 U/L (ref 9–46)
AST: 21 U/L (ref 10–35)
Albumin: 4.4 g/dL (ref 3.6–5.1)
BILIRUBIN DIRECT: 0.2 mg/dL (ref <=0.2)
BILIRUBIN INDIRECT: 0.4 mg/dL (ref 0.2–1.2)
BILIRUBIN TOTAL: 0.6 mg/dL (ref 0.2–1.2)
Total Protein: 6.7 g/dL (ref 6.1–8.1)

## 2015-11-12 LAB — LIPID PANEL
CHOL/HDL RATIO: 3.2 ratio (ref <=5.0)
Cholesterol: 127 mg/dL (ref 125–200)
HDL: 40 mg/dL (ref >=40)
LDL CALC: 67 mg/dL (ref <130)
Triglycerides: 102 mg/dL (ref <150)
VLDL: 20 mg/dL (ref <30)

## 2015-11-12 LAB — TSH: TSH: 1.61 m[IU]/L (ref 0.40–4.50)

## 2015-11-12 LAB — BASIC METABOLIC PANEL WITH GFR
BUN: 16 mg/dL (ref 7–25)
CHLORIDE: 102 mmol/L (ref 98–110)
CO2: 31 mmol/L (ref 20–31)
Calcium: 9.8 mg/dL (ref 8.6–10.3)
Creat: 0.93 mg/dL (ref 0.70–1.18)
GFR, Est African American: >89 mL/min (ref >=60)
GFR, Est Non African American: 82 mL/min (ref >=60)
Glucose, Bld: 114 mg/dL — ABNORMAL HIGH (ref 65–99)
POTASSIUM: 4.7 mmol/L (ref 3.5–5.3)
SODIUM: 141 mmol/L (ref 135–146)

## 2015-11-12 LAB — HEMOGLOBIN A1C
Hgb A1c MFr Bld: 6.9 % — ABNORMAL HIGH (ref <5.7)
Mean Plasma Glucose: 151 mg/dL

## 2015-11-12 LAB — MAGNESIUM: Magnesium: 2 mg/dL (ref 1.5–2.5)

## 2015-11-12 NOTE — Progress Notes (Signed)
Patient ID: Joshua Higgins, male   DOB: February 10, 1946, 70 y.o.   MRN: [id number]  [facility] ADULT & ADOLESCENT INTERNAL MEDICINE                       Unk Pinto, M.D.        Uvaldo Bristle. Silverio Lay, P.A.-C       Starlyn Skeans, P.A.-C   [facility]                [address] [place] [id number] Telephone [phone] Telefax [phone] _________________________________________________________________________   This very nice 70 y.o. MWM presents for 6 month follow up with Hypertension, Hyperlipidemia, T2_DM and Vitamin D Deficiency.    Patient is treated for HTN  since 1989 & BP has been controlled at home. Today's BP: 134/80 mmHg.  He had a PTCA in 2001 and CABG in 2008 and a negative Cardiolite. Patient has had no complaints of any cardiac type chest pain, palpitations, dyspnea/orthopnea/PND, dizziness, claudication, or dependent edema.   Hyperlipidemia is controlled with diet & meds. Patient denies myalgias or other med SE's. Last Lipids were Cholesterol 127; HDL 40; LDL 67; Triglycerides 102 on 11/12/2015.   Also, the patient has history of [name] and has had no symptoms of reactive hypoglycemia, diabetic polys, paresthesias or visual blurring.  Last A1c was 6.6% on 06/04/2015.    Further, the patient also has history of Vitamin D Deficiency of "82" in 2008  and supplements vitamin D without any suspected side-effects. Last vitamin D was 72 on 06/04/2015.    Medication Sig  . acyclovir  800 MG tablet Take 1 tablet (800 mg total) by mouth 3 (three) times daily as needed (fever blister).  Marland Kitchen aspirin 325 MG  Take 325 mg by mouth at bedtime.   Marland Kitchen atenolol 50 MG  TAKE 1 TABLET BY MOUTH DAILY  . Cholecalciferol 5000 UNITS  Take 1 tablet by mouth daily.  . IRON CR PO Take 1 tablet by mouth daily.  Marland Kitchen levothyroxine  50 MCG  Take 50 mcg by mouth daily.  . metFORMIN- XR 500  Take 1 to 2 tablets  2 x day as directed for Diabetes.   .  Multiple Vitamin  Take 1 tablet by mouth at bedtime.   . Omega-3 FISH OIL 1000 MG Take 1,000 mg by mouth at bedtime.   . rosuvastatin  20 MG tablet Take 5 mg by mouth at bedtime.   . sildenafil  20 MG tablet Take 1 tablet (20 mg total) by mouth 3 (three) times daily.  . valsartan  80 MG tablet TAKE 1/2 TO 1 TABLET BY MOUTH EVERY DAY FOR BLOOD PRESSURE AND KIDNEY PROTECTION  . NORCO 5-325  Take 1 tablet by mouth every 4 (four) hours as needed.   Allergies  Allergen Reactions  . Ace Inhibitors Cough  . Lipitor [Atorvastatin] Other (See Comments)    Joint pain.   PMHx:   Past Medical History  Diagnosis Date  . Diabetes mellitus without complication ([place])     type II  . Hyperlipidemia   . GERD (gastroesophageal reflux disease)   . Coronary artery disease     CABG - 2008  . Migraine headache with aura     takes Atenolol  . Sleep apnea     does not use cpap  . Hypothyroidism   . Arthritis   .  Cancer ([place])     skin cancer on face  . Anemia     low iron  . Hepatitis   . Cataracts, bilateral   . Hypertension     pt states he does not have HTN, takes Atenolol for migraines   Immunization History  Administered Date(s) Administered  . DT 08/29/2003, 06/04/2015  . Influenza-Unspecified 04/16/2014  . Pneumococcal Conjugate-13 12/26/2013  . Pneumococcal Polysaccharide-23 01/29/2010  . Zoster 05/11/2010   Past Surgical History  Procedure Laterality Date  . Cardiac surgery      Bypass  . Knee surgery Right     arthroscopic  . Back surgery    . Coronary angioplasty  2001  . Coronary artery bypass graft    . Surgery scrotal / testicular      as a child/teenager  . Elbow surgery Left     due to damaged nerve  . Colonoscopy    . Lumbar laminectomy/decompression microdiscectomy Right 08/04/2014    Procedure: Right Lumbar four-five Microdiskectomy;  Surgeon: Kristeen Miss, MD;  Location: [facility];  Service: Neurosurgery;  Laterality: Right;  Right L4-5 Microdiskectomy   FHx:     Reviewed / unchanged  SHx:    Reviewed / unchanged  Systems Review:  Constitutional: Denies fever, chills, wt changes, headaches, insomnia, fatigue, night sweats, change in appetite. Eyes: Denies redness, blurred vision, diplopia, discharge, itchy, watery eyes.  ENT: Denies discharge, congestion, post nasal drip, epistaxis, sore throat, earache, hearing loss, dental pain, tinnitus, vertigo, sinus pain, snoring.  CV: Denies chest pain, palpitations, irregular heartbeat, syncope, dyspnea, diaphoresis, orthopnea, PND, claudication or edema. Respiratory: denies cough, dyspnea, DOE, pleurisy, hoarseness, laryngitis, wheezing.  Gastrointestinal: Denies dysphagia, odynophagia, heartburn, reflux, water brash, abdominal pain or cramps, nausea, vomiting, bloating, diarrhea, constipation, hematemesis, melena, hematochezia  or hemorrhoids. Genitourinary: Denies dysuria, frequency, urgency, nocturia, hesitancy, discharge, hematuria or flank pain. Musculoskeletal: Denies arthralgias, myalgias, stiffness, jt. swelling, pain, limping or strain/sprain.  Skin: Denies pruritus, rash, hives, warts, acne, eczema or change in skin lesion(s). Neuro: No weakness, tremor, incoordination, spasms, paresthesia or pain. Psychiatric: Denies confusion, memory loss or sensory loss. Endo: Denies change in weight, skin or hair change.  Heme/Lymph: No excessive bleeding, bruising or enlarged lymph nodes.  Physical Exam  BP 134/80 mmHg  Pulse 64  Temp(Src) 97.7 F (36.5 C)  Resp 16  Ht 5\' 9"  (1.753 m)  Wt 189 lb 9.6 oz (86.002 kg)  BMI 27.99 kg/m2  Appears well nourished and in no distress. Eyes: PERRLA, EOMs, conjunctiva no swelling or erythema. Sinuses: No frontal/maxillary tenderness ENT/Mouth: EAC's clear, TM's nl w/o erythema, bulging. Nares clear w/o erythema, swelling, exudates. Oropharynx clear without erythema or exudates. Oral hygiene is good. Tongue normal, non obstructing. Hearing intact.  Neck: Supple.  Thyroid nl. Car 2+/2+ without bruits, nodes or JVD. Chest: Respirations nl with BS clear & equal w/o rales, rhonchi, wheezing or stridor.  Cor: Heart sounds normal w/ regular rate and rhythm without sig. murmurs, gallops, clicks, or rubs. Peripheral pulses normal and equal  without edema.  Abdomen: Soft & bowel sounds normal. Non-tender w/o guarding, rebound, hernias, masses, or organomegaly.  Lymphatics: Unremarkable.  Musculoskeletal: Full ROM all peripheral extremities, joint stability, 5/5 strength, and normal gait.  Skin: Warm, dry without exposed rashes, lesions or ecchymosis apparent.  Neuro: Cranial nerves intact, reflexes equal bilaterally. Sensory-motor testing grossly intact. Tendon reflexes grossly intact.  Pysch: Alert & oriented x 3.  Insight and judgement nl & appropriate. No ideations.  Assessment and Plan:  1. Essential hypertension  - TSH  2. Hyperlipidemia  - Lipid panel - TSH  3. Type 2 diabetes mellitus with stage 2 chronic kidney disease, without long-term current use of insulin (HCC)  - Hemoglobin A1c - Insulin, random  4. Vitamin D deficiency  - VITAMIN D 25 Hydroxy   5. Hypothyroidism  - TSH  6. Medication management  - CBC with Differential/Platelet - BASIC METABOLIC PANEL WITH GFR - Hepatic function panel - Magnesium   Recommended regular exercise, BP monitoring, weight control, and discussed med and SE's. Recommended labs to assess and monitor clinical status. Further disposition pending results of labs. Over 30 minutes of exam, counseling, chart review was performed

## 2015-11-12 NOTE — Patient Instructions (Signed)

## 2015-11-13 LAB — VITAMIN D 25 HYDROXY (VIT D DEFICIENCY, FRACTURES): Vit D, 25-Hydroxy: 79 ng/mL (ref 30–100)

## 2015-11-13 LAB — INSULIN, RANDOM: Insulin: 17.8 u[IU]/mL (ref 2.0–19.6)

## 2015-11-21 ENCOUNTER — Encounter: Payer: Self-pay | Admitting: *Deleted

## 2015-11-21 ENCOUNTER — Other Ambulatory Visit: Payer: Self-pay | Admitting: *Deleted

## 2015-12-01 ENCOUNTER — Other Ambulatory Visit: Payer: Self-pay | Admitting: Internal Medicine

## 2016-01-19 ENCOUNTER — Other Ambulatory Visit: Payer: Self-pay | Admitting: Internal Medicine

## 2016-01-20 ENCOUNTER — Encounter: Payer: Self-pay | Admitting: Physician Assistant

## 2016-01-20 ENCOUNTER — Other Ambulatory Visit: Payer: Self-pay | Admitting: Physician Assistant

## 2016-01-20 ENCOUNTER — Other Ambulatory Visit: Payer: Self-pay

## 2016-01-20 MED ORDER — ATENOLOL 50 MG PO TABS: 50.0000 mg | ORAL_TABLET | Freq: Every day | ORAL | Status: DC

## 2016-01-20 MED ORDER — BISOPROLOL-HYDROCHLOROTHIAZIDE 10-6.25 MG PO TABS: 1.0000 | ORAL_TABLET | Freq: Every day | ORAL | Status: DC

## 2016-01-20 NOTE — Progress Notes (Signed)
Informed pt that atenolol was on back order and he states that he takes atenolol for his migraines & does not need it for B/P so he does not want to change. He states that this works for him and will just wait. He states his wife has some that he can use.

## 2016-02-12 ENCOUNTER — Ambulatory Visit (INDEPENDENT_AMBULATORY_CARE_PROVIDER_SITE_OTHER): Payer: Medicare Other | Admitting: Physician Assistant

## 2016-02-12 ENCOUNTER — Encounter: Payer: Self-pay | Admitting: Physician Assistant

## 2016-02-12 VITALS — BP 130/76 | HR 72 | Temp 98.1°F | Resp 16 | Ht 69.0 in | Wt 191.8 lb

## 2016-02-12 DIAGNOSIS — E1122 Type 2 diabetes mellitus with diabetic chronic kidney disease: Secondary | ICD-10-CM | POA: Diagnosis not present

## 2016-02-12 DIAGNOSIS — I700 Atherosclerosis of aorta: Secondary | ICD-10-CM

## 2016-02-12 DIAGNOSIS — E039 Hypothyroidism, unspecified: Secondary | ICD-10-CM

## 2016-02-12 DIAGNOSIS — E785 Hyperlipidemia, unspecified: Secondary | ICD-10-CM

## 2016-02-12 DIAGNOSIS — N182 Chronic kidney disease, stage 2 (mild): Secondary | ICD-10-CM

## 2016-02-12 DIAGNOSIS — I25708 Atherosclerosis of coronary artery bypass graft(s), unspecified, with other forms of angina pectoris: Secondary | ICD-10-CM | POA: Diagnosis not present

## 2016-02-12 DIAGNOSIS — Z79899 Other long term (current) drug therapy: Secondary | ICD-10-CM

## 2016-02-12 DIAGNOSIS — E559 Vitamin D deficiency, unspecified: Secondary | ICD-10-CM | POA: Diagnosis not present

## 2016-02-12 DIAGNOSIS — I10 Essential (primary) hypertension: Secondary | ICD-10-CM

## 2016-02-12 LAB — CBC WITH DIFFERENTIAL/PLATELET
BASOS PCT: 0 %
Basophils Absolute: 0 cells/uL (ref 0–200)
EOS ABS: 168 {cells}/uL (ref 15–500)
Eosinophils Relative: 3 %
HCT: 40.7 % (ref 38.5–50.0)
Hemoglobin: 13.6 g/dL (ref 13.2–17.1)
Lymphocytes Relative: 40 %
Lymphs Abs: 2240 cells/uL (ref 850–3900)
MCH: 33.4 pg — ABNORMAL HIGH (ref 27.0–33.0)
MCHC: 33.4 g/dL (ref 32.0–36.0)
MCV: 100 fL (ref 80.0–100.0)
MONOS PCT: 9 %
MPV: 9.4 fL (ref 7.5–12.5)
Monocytes Absolute: 504 cells/uL (ref 200–950)
NEUTROS ABS: 2688 {cells}/uL (ref 1500–7800)
Neutrophils Relative %: 48 %
PLATELETS: 189 10*3/uL (ref 140–400)
RBC: 4.07 MIL/uL — AB (ref 4.20–5.80)
RDW: 13.5 % (ref 11.0–15.0)
WBC: 5.6 10*3/uL (ref 3.8–10.8)

## 2016-02-12 LAB — MAGNESIUM: Magnesium: 2.1 mg/dL (ref 1.5–2.5)

## 2016-02-12 LAB — LIPID PANEL
CHOL/HDL RATIO: 2.7 ratio (ref <=5.0)
Cholesterol: 124 mg/dL — ABNORMAL LOW (ref 125–200)
HDL: 46 mg/dL (ref >=40)
LDL CALC: 60 mg/dL (ref <130)
TRIGLYCERIDES: 90 mg/dL (ref <150)
VLDL: 18 mg/dL (ref <30)

## 2016-02-12 LAB — HEPATIC FUNCTION PANEL
ALK PHOS: 48 U/L (ref 40–115)
ALT: 23 U/L (ref 9–46)
AST: 19 U/L (ref 10–35)
Albumin: 4.6 g/dL (ref 3.6–5.1)
BILIRUBIN DIRECT: 0.1 mg/dL (ref <=0.2)
BILIRUBIN INDIRECT: 0.4 mg/dL (ref 0.2–1.2)
BILIRUBIN TOTAL: 0.5 mg/dL (ref 0.2–1.2)
Total Protein: 6.7 g/dL (ref 6.1–8.1)

## 2016-02-12 LAB — BASIC METABOLIC PANEL WITH GFR
BUN: 17 mg/dL (ref 7–25)
CHLORIDE: 104 mmol/L (ref 98–110)
CO2: 26 mmol/L (ref 20–31)
Calcium: 9.5 mg/dL (ref 8.6–10.3)
Creat: 1.16 mg/dL (ref 0.70–1.18)
GFR, EST AFRICAN AMERICAN: 72 mL/min (ref >=60)
GFR, EST NON AFRICAN AMERICAN: 63 mL/min (ref >=60)
Glucose, Bld: 124 mg/dL — ABNORMAL HIGH (ref 65–99)
POTASSIUM: 4.6 mmol/L (ref 3.5–5.3)
SODIUM: 141 mmol/L (ref 135–146)

## 2016-02-12 LAB — HEMOGLOBIN A1C
HEMOGLOBIN A1C: 6.6 % — AB (ref <5.7)
Mean Plasma Glucose: 143 mg/dL

## 2016-02-12 LAB — TSH: TSH: 1.91 mIU/L (ref 0.40–4.50)

## 2016-02-12 NOTE — Progress Notes (Signed)
Patient ID: Joshua Higgins, male   DOB: March 01, 1946, 70 y.o.   MRN: [id number]  Assessment and Plan:  Hypertension:  -Continue medication -monitor blood pressure at home. -Continue DASH diet -Reminder to go to the ER if any CP, SOB, nausea, dizziness, severe HA, changes vision/speech, left arm numbness and tingling and jaw pain.  Cholesterol - Continue diet and exercise -Check cholesterol.   Diabetes with diabetic chronic kidney disease -Continue diet and exercise.  -Check A1C  Vitamin D Def -check level -continue medications.  Hypothyroidism -check TSH level, continue medications the same, reminded to take on an empty stomach 30-17mins before food.    Atherosclerosis of aorta Control blood pressure, cholesterol, glucose, increase exercise.    Continue diet and meds as discussed. Further disposition pending results of labs. Discussed med's effects and SE's.   No future appointments. Set up 3 month for wellness  HPI 70 y.o. male  presents for 3 month follow up with hypertension, hyperlipidemia, diabetes and vitamin D deficiency.   His blood pressure has been controlled at home, today their BP is BP: 130/76.He does not workout. He denies chest pain, shortness of breath, dizziness. He has history of PTCA in 2001 and CABG in 2008, follows with Dr. Benn Moulder.    He is on cholesterol medication and denies myalgias. His cholesterol is at goal. The cholesterol was:  11/12/2015: Cholesterol 127; HDL 40; LDL Cholesterol 67; Triglycerides 102   He has been working on diet and exercise for diabetes with diabetic chronic kidney disease, he is on ASA 325mg , he is on ACE/ARB, and denies  foot ulcerations, hyperglycemia, hypoglycemia , increased appetite, nausea, paresthesia of the feet, polydipsia, polyuria, visual disturbances, vomiting and weight loss. Last A1C was: 11/12/2015: Hgb A1c MFr Bld 6.9   Patient is on Vitamin D supplement. 11/12/2015: Vit D, 25-Hydroxy 79  He is on thyroid  medication. His medication was not changed last visit.   Lab Results  Component Value Date   TSH 1.61 11/12/2015   BMI is Body mass index is 28.32 kg/m., he is working on diet and exercise. Wt Readings from Last 3 Encounters:  02/12/16 191 lb 12.8 oz (87 kg)  11/12/15 189 lb 9.6 oz (86 kg)  10/10/15 190 lb (86.2 kg)     Current Medications:  Current Outpatient Prescriptions on File Prior to Visit  Medication Sig Dispense Refill  . acyclovir (ZOVIRAX) 800 MG tablet Take 1 tablet (800 mg total) by mouth 3 (three) times daily as needed (fever blister). 90 tablet 0  . aspirin 325 MG tablet Take 325 mg by mouth at bedtime.     Marland Kitchen atenolol (TENORMIN) 50 MG tablet Take 1 tablet (50 mg total) by mouth daily. 90 tablet 0  . bisoprolol-hydrochlorothiazide (ZIAC) 10-6.25 MG tablet Take 1 tablet by mouth daily. 30 tablet 3  . Cholecalciferol 5000 UNITS TABS Take 1 tablet by mouth daily.    . IRON CR PO Take 1 tablet by mouth daily.    Marland Kitchen levothyroxine (SYNTHROID, LEVOTHROID) 50 MCG tablet TAKE 1 TABLET BY MOUTH DAILY 90 tablet 0  . metFORMIN (GLUCOPHAGE) 500 MG tablet TAKE 4 TABLETS BY MOUTH EVERY DAY 360 tablet 0  . Multiple Vitamin (MULTIVITAMIN) tablet Take 1 tablet by mouth at bedtime.     . Omega-3 Fatty Acids (FISH OIL) 1000 MG CAPS Take 1,000 mg by mouth at bedtime.     . rosuvastatin (CRESTOR) 20 MG tablet Take 5 mg by mouth at bedtime.     Marland Kitchen  sildenafil (REVATIO) 20 MG tablet Take 1 tablet (20 mg total) by mouth 3 (three) times daily. 30 tablet 0  . valsartan (DIOVAN) 80 MG tablet TAKE 1/2 TO 1 TABLET BY MOUTH EVERY DAY FOR BLOOD PRESSURE AND KIDNEY PROTECTION 90 tablet 1   No current facility-administered medications on file prior to visit.    Medical History:  Past Medical History:  Diagnosis Date  . Anemia    low iron  . Arthritis   . Cancer ([place])    skin cancer on face  . Cataracts, bilateral   . Coronary artery disease    CABG - 2008  . Diabetes mellitus without  complication ([place])    type II  . GERD (gastroesophageal reflux disease)   . Hepatitis   . Hyperlipidemia   . Hypertension    pt states he does not have HTN, takes Atenolol for migraines  . Hypothyroidism   . Migraine headache with aura    takes Atenolol  . Sleep apnea    does not use cpap   Allergies:  Allergies  Allergen Reactions  . Ace Inhibitors Cough  . Lipitor [Atorvastatin] Other (See Comments)    Joint pain.     Review of Systems:  Review of Systems  Constitutional: Negative for chills, fever and malaise/fatigue.  HENT: Negative for congestion, ear pain and sore throat.   Eyes: Negative.   Respiratory: Negative for cough, shortness of breath and wheezing.   Cardiovascular: Negative for chest pain, palpitations and leg swelling.  Gastrointestinal: Negative for blood in stool, constipation, diarrhea, heartburn and melena.  Genitourinary: Negative.   Skin: Negative.   Neurological: Negative for dizziness, sensory change, loss of consciousness and headaches.  Psychiatric/Behavioral: Negative for depression. The patient is not nervous/anxious and does not have insomnia.     Family history- Review and unchanged  Social history- Review and unchanged  Physical Exam: BP 130/76   Pulse 72   Temp 98.1 F (36.7 C)   Resp 16   Ht 5\' 9"  (1.753 m)   Wt 191 lb 12.8 oz (87 kg)   SpO2 98%   BMI 28.32 kg/m  Wt Readings from Last 3 Encounters:  02/12/16 191 lb 12.8 oz (87 kg)  11/12/15 189 lb 9.6 oz (86 kg)  10/10/15 190 lb (86.2 kg)   General Appearance: Well nourished well developed, non-toxic appearing, in no apparent distress. Eyes: PERRLA, EOMs, conjunctiva no swelling or erythema ENT/Mouth: Ear canals clear with no erythema, swelling, or discharge.  TMs normal bilaterally, oropharynx clear, moist, with no exudate.   Neck: Supple, thyroid normal, no JVD, no cervical adenopathy.  Respiratory: Respiratory effort normal, breath sounds clear A&P, no wheeze, rhonchi or  rales noted.  No retractions, no accessory muscle usage Cardio: RRR with no MRGs. No noted edema.  Abdomen: Soft, + BS.  Non tender, no guarding, rebound, ventral hernia vs. Diastasis of abdomen, no masses. Musculoskeletal: Full ROM, 5/5 strength, Normal gait Skin: Warm, dry without rashes, lesions, ecchymosis.  Neuro: Awake and oriented X 3, Cranial nerves intact. No cerebellar symptoms.  Psych: normal affect, Insight and Judgment appropriate.    Vicie Mutters, PA-C 9:12 AM [facility]

## 2016-02-12 NOTE — Patient Instructions (Addendum)
Phlebitis Phlebitis is soreness and swelling (inflammation) of a vein. This can occur in your arms, legs, or torso (trunk), as well as deeper inside your body. Phlebitis is usually not serious when it occurs close to the surface of the body. However, it can cause serious problems when it occurs in a vein deeper inside the body. CAUSES  Phlebitis can be triggered by various things, including:   Reduced blood flow through your veins. This can happen with:  Bed rest over a long period.  Long-distance travel.  Injury.  Surgery.  Being overweight (obese) or pregnant.  Having an IV tube put in the vein and getting certain medicines through the vein.  Cancer and cancer treatment.  Use of illegal drugs taken through the vein.  Inflammatory diseases.  Inherited (genetic) diseases that increase the risk of blood clots.  Hormone therapy, such as birth control pills. SIGNS AND SYMPTOMS   Red, tender, swollen, and painful area on your skin. Usually, the area will be long and narrow.  Firmness along the center of the affected area. This can indicate that a blood clot has formed.  Low-grade fever. DIAGNOSIS  A health care provider can usually diagnose phlebitis by examining the affected area and asking about your symptoms. To check for infection or blood clots, your health care provider may order blood tests or an ultrasound exam of the area. Blood tests and your family history may also indicate if you have an underlying genetic disease that causes blood clots. Occasionally, a piece of tissue is taken from the body (biopsy sample) if an unusual cause of phlebitis is suspected. TREATMENT  Treatment will vary depending on the severity of the condition and the area of the body affected. Treatment may include:  Use of a warm compress or heating pad.  Use of compression stockings or bandages.  Anti-inflammatory medicines.  Removal of any IV tube that may be causing the problem.  Medicines  that kill germs (antibiotics) if an infection is present.  Blood-thinning medicines if a blood clot is suspected or present.  In rare cases, surgery may be needed to remove damaged sections of vein. HOME CARE INSTRUCTIONS   Only take over-the-counter or prescription medicines as directed by your health care provider. Take all medicines exactly as prescribed.  Raise (elevate) the affected area above the level of your heart as directed by your health care provider.  Apply a warm compress or heating pad to the affected area as directed by your health care provider. Do not sleep with the heating pad.  Use compression stockings or bandages as directed. These will speed healing and prevent the condition from coming back.  If you are on blood thinners:  Get follow-up blood tests as directed by your health care provider.  Check with your health care provider before using any new medicines.  Carry a medical alert card or wear your medical alert jewelry to show that you are on blood thinners.  For phlebitis in the legs:  Avoid prolonged standing or bed rest.  Keep your legs moving. Raise your legs when sitting or lying.  Do not smoke.  Women, particularly those over the age of 35, should consider the risks and benefits of taking the contraceptive pill. This kind of hormone treatment can increase your risk for blood clots.  Follow up with your health care provider as directed. SEEK MEDICAL CARE IF:   You have unusual bruising or any bleeding problems.  Your swelling or pain in the affected area   is not improving.  You are on anti-inflammatory medicine, and you develop belly (abdominal) pain. SEEK IMMEDIATE MEDICAL CARE IF:   You have a sudden onset of chest pain or difficulty breathing.  You have a fever or persistent symptoms for more than 2-3 days.  You have a fever and your symptoms suddenly get worse. MAKE SURE YOU:  Understand these instructions.  Will watch your  condition.  Will get help right away if you are not doing well or get worse.   This information is not intended to replace advice given to you by your health care provider. Make sure you discuss any questions you have with your health care provider.   Document Released: 06/14/2001 Document Revised: 04/10/2013 Document Reviewed: 02/25/2013 Elsevier Interactive Patient Education [employer].   Here is some information to help you keep your heart healthy: Move it! - Aim for 30 mins of activity every day. Take it slowly at first. Talk to [place] before starting any new exercise program.   Lose it.  -Body Mass Index (BMI) can indicate if you need to lose weight. A healthy range is 18.5-24.9. For a BMI calculator, go to [employer].com  Waist Management -Excess abdominal fat is a risk factor for heart disease, diabetes, asthma, stroke and more. Ideal waist circumference is less than 35" for women and less than 40" for men.   Eat Right -focus on fruits, vegetables, whole grains, and meals you make yourself. Avoid foods with trans fat and high sugar/sodium content.   Snooze or Snore? - Loud snoring can be a sign of sleep apnea, a significant risk factor for high blood pressure, heart attach, stroke, and heart arrhythmias.  Kick the habit -Quit Smoking! Avoid second hand smoke. A single cigarette raises your blood pressure for 20 mins and increases the risk of heart attack and stroke for the next 24 hours.   Are Aspirin and Supplements right for you? -Add ENTERIC COATED low dose 81 mg Aspirin daily OR can do every other day if you have easy bruising to protect your heart and head. As well as to reduce risk of Colon Cancer by 20 %, Skin Cancer by 26 % , Melanoma by 46% and Pancreatic cancer by 60%  Say "No to Stress -There may be little you can do about problems that cause stress. However, techniques such as long walks, meditation, and exercise can help you manage it.   Start Now! - Make  changes one at a time and set reasonable goals to increase your likelihood of success.

## 2016-03-08 DIAGNOSIS — Z23 Encounter for immunization: Secondary | ICD-10-CM | POA: Diagnosis not present

## 2016-03-11 ENCOUNTER — Other Ambulatory Visit: Payer: Self-pay | Admitting: Internal Medicine

## 2016-03-22 ENCOUNTER — Other Ambulatory Visit: Payer: Self-pay | Admitting: Internal Medicine

## 2016-04-18 ENCOUNTER — Other Ambulatory Visit: Payer: Self-pay | Admitting: Physician Assistant

## 2016-05-02 ENCOUNTER — Encounter: Payer: Self-pay | Admitting: Internal Medicine

## 2016-05-02 ENCOUNTER — Ambulatory Visit (INDEPENDENT_AMBULATORY_CARE_PROVIDER_SITE_OTHER): Payer: Medicare Other | Admitting: Internal Medicine

## 2016-05-02 VITALS — BP 122/80 | HR 80 | Temp 97.3°F | Ht 69.0 in | Wt 192.6 lb

## 2016-05-02 DIAGNOSIS — I25708 Atherosclerosis of coronary artery bypass graft(s), unspecified, with other forms of angina pectoris: Secondary | ICD-10-CM | POA: Diagnosis not present

## 2016-05-02 DIAGNOSIS — E782 Mixed hyperlipidemia: Secondary | ICD-10-CM | POA: Diagnosis not present

## 2016-05-02 DIAGNOSIS — I10 Essential (primary) hypertension: Secondary | ICD-10-CM

## 2016-05-02 NOTE — Patient Instructions (Addendum)

## 2016-05-02 NOTE — Progress Notes (Signed)
[place] [facility]   Unk Pinto, M.D.    Uvaldo Bristle. Silverio Lay, P.A.-C      Starlyn Skeans, P.A.-C  [facility]                [address] [place], [place] [id number] Telephone [phone] Telefax [phone] Subjective:    Patient ID: Joshua Higgins, male    DOB: 12-Jan-1946, 70 y.o.   MRN: [id number]  HPI  Patient presents for f/u relating he stopped his Crestor 5 mg daily (about 7 years) about 9-10 days ago due to severe muscle aches and weakness with alleged recovery within 2-3 days similar to when he stopped Lipitor in the remote past.   Medication Sig  . acyclovir (ZOVIRAX) 800 MG tablet Take 1 tab 3  times daily as needed  . aspirin 325 MG tablet Take 325 mgat bedtime.   Marland Kitchen atenolol  50 MG tablet Take 1 tab daily.  . Cholecalciferol 5000 UNITS TABS Take 1 tab daily.  . IRON CR PO Take 1 tab daily.  Marland Kitchen levothyroxine  50 MCG tablet TAKE 1 TABDAILY  . metFORMIN  500 MG tablet TAKE 4 TABLETS BY MOUTH EVERY DAY  . Multiple Vitamin  Take 1 tablet by mouth at bedtime.   . Omega-3 FISH OIL 1000 MG Take 1,000 mg by mouth at bedtime.   . rosuvastatin  20 MG  Take 5 mg by mouth at bedtime.   . sildenafil  20 MG tablet Take 1 tablet (20 mg total) by mouth 3 (three) times daily.  . valsartan ( 80 MG tablet TAKE 1/2 TO 1 TABLET BY MOUTH EVERY DAY FOR BLOOD PRESSURE AND KIDNEY PROTECTION  . rosuvastatin (CRESTOR) 20 MG tablet Takes 1/4 tab (5 mg) daily -OFF x 9-10 days   Allergies  Allergen Reactions  . Ace Inhibitors Cough  . Lipitor [Atorvastatin] Other (See Comments)    Joint pain.  premenstrual tension syndrome Past Surgical History:  Procedure Laterality Date  . BACK SURGERY    . CARDIAC SURGERY     Bypass  . COLONOSCOPY    . CORONARY ANGIOPLASTY  2001  . CORONARY ARTERY BYPASS GRAFT    . ELBOW SURGERY Left    due to damaged nerve  . KNEE SURGERY Right    arthroscopic  . LUMBAR  LAMINECTOMY/DECOMPRESSION MICRODISCECTOMY Right 08/04/2014   Procedure: Right Lumbar four-five Microdiskectomy;  Surgeon: Kristeen Miss, MD;  Location: [facility];  Service: Neurosurgery;  Laterality: Right;  Right L4-5 Microdiskectomy  . SURGERY SCROTAL / TESTICULAR     as a child/teenager   Review of Systems  10 point systems review negative except as above.    Objective:   Physical Exam  BP 122/80   Pulse 80   Temp 97.3 F (36.3 C)   Ht 5\' 9"  (1.753 m)   Wt 192 lb 9.6 oz (87.4 kg)   BMI 28.44 kg/m   HEENT - WNL Neck - supple. Nl Thyroid. Carotids 2+ & No bruits, nodes, JVD Chest - Clear. Cor - Nl HS. RRR w/o sig M.  MS- FROM w/o deformities. Muscle power, tone and bulk Nl. Neuro -  Nl w/o focal abnormalities.    Assessment & Plan:   1. Essential hypertension   2. Mixed hyperlipidemia  - Lipid panel  3. Coronary artery disease involving coronary bypass graft of native heart with other forms  of angina pectoris ([place])      Recommended regular exercise, BP monitoring, low cholesterol diet and discussed med and SE's. Recommended labs to assess and monitor clinical status. Further disposition pending results of labs. Over 15 minutes of exam, counseling, chart review and decision making was performed.

## 2016-05-03 LAB — LIPID PANEL
CHOL/HDL RATIO: 3.8 ratio (ref <=5.0)
Cholesterol: 160 mg/dL (ref 125–200)
HDL: 42 mg/dL (ref >=40)
LDL CALC: 95 mg/dL (ref <130)
Triglycerides: 115 mg/dL (ref <150)
VLDL: 23 mg/dL (ref <30)

## 2016-05-16 ENCOUNTER — Ambulatory Visit: Payer: Self-pay | Admitting: Internal Medicine

## 2016-05-25 ENCOUNTER — Encounter: Payer: Self-pay | Admitting: Internal Medicine

## 2016-05-25 ENCOUNTER — Ambulatory Visit (INDEPENDENT_AMBULATORY_CARE_PROVIDER_SITE_OTHER): Payer: Medicare Other | Admitting: Internal Medicine

## 2016-05-25 VITALS — BP 130/64 | HR 72 | Temp 97.5°F | Resp 16 | Ht 69.0 in | Wt 190.4 lb

## 2016-05-25 DIAGNOSIS — I25708 Atherosclerosis of coronary artery bypass graft(s), unspecified, with other forms of angina pectoris: Secondary | ICD-10-CM | POA: Diagnosis not present

## 2016-05-25 DIAGNOSIS — L820 Inflamed seborrheic keratosis: Secondary | ICD-10-CM | POA: Diagnosis not present

## 2016-05-25 DIAGNOSIS — I10 Essential (primary) hypertension: Secondary | ICD-10-CM | POA: Diagnosis not present

## 2016-05-25 NOTE — Progress Notes (Signed)
[place] [facility]   Unk Pinto, M.D.    Uvaldo Bristle. Silverio Lay, P.A.-C      Starlyn Skeans, P.A.-C   [facility]                [address] [place], [place] [id number] Telephone [phone] Telefax [phone] Subjective:    Patient ID: Joshua Higgins, male    DOB: [birth year], 70 y.o.   MRN: [id number]  HPI  This very nice 70 yo MWM with HTN, ASCAD/CABG,[name], T2_DM and Vit D Def presents For BP f/u and reports BP's have been normal at home . He denies HA, dizziness, CP, palpitations, PND/orthopnea or dependent edema. He a;lso expresses concerns about multiple raised scaly pruritic skin lesions on head, neck, trunk & extremities. that he tries to scratch off.  Medication Sig  . acyclovir (ZOVIRAX) 800 MG tablet Take 1 tab  3 x daily as needed  . aspirin 325 MG tablet Take  at bedtime.   Marland Kitchen atenolol  50 MG tablet Take 1 tab daily.  . Cholecalciferol 5000 UNITS  Take 1 tab daily.  . IRON CR PO Take 1 tab daily.  Marland Kitchen levothyroxine  50 MCG tablet TAKE 1 TAB DAILY  . metFORMIN  500 MG tablet TAKE 4 TAB EVERY DAY  . Multiple Vitamin Take 1 tab at bedtime.   . Omega-3 FISH OIL 1000 MG CAPS Take  at bedtime.   . sildenafil (REVATIO) 20 MG tablet Take 1 tab 3times daily.  . valsartan (DIOVAN) 80 MG tablet TAKE 1/2-1 TAB EVERY DAY  . rosuvastatin (CRESTOR) 20 MG tablet Take 5 mg  at bedtime.    Allergies  Allergen Reactions  . Ace Inhibitors Cough  . Lipitor [Atorvastatin] Other (See Comments)    Joint pain.   Past Medical History:  Diagnosis Date  . Anemia    low iron  . Arthritis   . Cancer ([place])    skin cancer on face  . Cataracts, bilateral   . Coronary artery disease    CABG - 2008  . Diabetes mellitus without complication ([place])    type II  . GERD (gastroesophageal reflux disease)   . Hepatitis   . Hyperlipidemia   . Hypertension    pt states he does not have HTN, takes Atenolol for migraines   . Hypothyroidism   . Migraine headache with aura    takes Atenolol  . Sleep apnea    does not use cpap   Review of Systems  10 point systems review negative except as above.    Objective:   Physical Exam  BP 130/64   Pulse 72   Temp 97.5 F (36.4 C)   Resp 16   Ht 5\' 9"  (1.753 m)   Wt 190 lb 6.4 oz (86.4 kg)   BMI 28.12 kg/m   HEENT - WNL. Neck - supple. Nl Thyroid. Carotids 2+ & No bruits, nodes, JVD Chest - Clear. Cor - Nl HS. RRR w/o sig M. PP 1(+). No edema. MS- FROM. Muscle power, tone and bulk Nl. Marland Kitchen Neuro -  Nl w/o focal abnormalities. Skin - Multiple irritated excoriated seborrheic keratoses of light to medium brown color with erythema, but no ulcerations ranging in size from 5 to 12 mm scattered over temples (#2) , neck (#4), upper trunk & back (#5) and Bilat UE (#4) - all (#15) treated with liquid  nitrogen by double-triple freeze thaw technique.   Procedure 17000 & 17000 x 3  Patient instructed in po wound care for healing.     Assessment & Plan:   1. Essential hypertension  - discussed meds, SE, diet/exercise and random BP monitoring.   2. Seborrheic keratoses, inflamed  Procedure 17000 & 17003 x 3

## 2016-06-09 ENCOUNTER — Ambulatory Visit (INDEPENDENT_AMBULATORY_CARE_PROVIDER_SITE_OTHER): Payer: Medicare Other | Admitting: Internal Medicine

## 2016-06-09 ENCOUNTER — Encounter: Payer: Self-pay | Admitting: Internal Medicine

## 2016-06-09 VITALS — BP 136/74 | HR 72 | Temp 98.0°F | Resp 16 | Ht 69.0 in | Wt 190.0 lb

## 2016-06-09 DIAGNOSIS — N182 Chronic kidney disease, stage 2 (mild): Secondary | ICD-10-CM

## 2016-06-09 DIAGNOSIS — K219 Gastro-esophageal reflux disease without esophagitis: Secondary | ICD-10-CM

## 2016-06-09 DIAGNOSIS — E559 Vitamin D deficiency, unspecified: Secondary | ICD-10-CM

## 2016-06-09 DIAGNOSIS — E782 Mixed hyperlipidemia: Secondary | ICD-10-CM

## 2016-06-09 DIAGNOSIS — E1122 Type 2 diabetes mellitus with diabetic chronic kidney disease: Secondary | ICD-10-CM

## 2016-06-09 DIAGNOSIS — E039 Hypothyroidism, unspecified: Secondary | ICD-10-CM | POA: Diagnosis not present

## 2016-06-09 DIAGNOSIS — Z0001 Encounter for general adult medical examination with abnormal findings: Secondary | ICD-10-CM | POA: Diagnosis not present

## 2016-06-09 DIAGNOSIS — Z79899 Other long term (current) drug therapy: Secondary | ICD-10-CM | POA: Diagnosis not present

## 2016-06-09 DIAGNOSIS — R6889 Other general symptoms and signs: Secondary | ICD-10-CM

## 2016-06-09 DIAGNOSIS — I25708 Atherosclerosis of coronary artery bypass graft(s), unspecified, with other forms of angina pectoris: Secondary | ICD-10-CM | POA: Diagnosis not present

## 2016-06-09 DIAGNOSIS — K589 Irritable bowel syndrome without diarrhea: Secondary | ICD-10-CM

## 2016-06-09 DIAGNOSIS — I700 Atherosclerosis of aorta: Secondary | ICD-10-CM

## 2016-06-09 DIAGNOSIS — I10 Essential (primary) hypertension: Secondary | ICD-10-CM

## 2016-06-09 DIAGNOSIS — Z0000 Encounter for general adult medical examination without abnormal findings: Secondary | ICD-10-CM

## 2016-06-09 NOTE — Progress Notes (Signed)
MEDICARE ANNUAL WELLNESS VISIT AND FOLLOW UP Assessment:    1. Atherosclerosis of abdominal aorta (HCC) -cont diet and exercise -last cholesterol at goal -statin intolerant  2. Coronary artery disease involving coronary bypass graft of native heart with other forms of angina pectoris ([place]) -follows with cardiology  3. Essential hypertension -cont meds -dash diet -monitor at home -well controlled  4. Gastroesophageal reflux disease, esophagitis presence not specified -cont med -avoid trigger foods  5. Irritable bowel syndrome, unspecified type -avoid trigger foods -not currently problematic  6. Hypothyroidism, unspecified type -cont levothyroxine -tsh level at next visit  7. Type 2 diabetes mellitus with stage 2 chronic kidney disease, without long-term current use of insulin (HCC) -cont diet and exercise -cont checking cbg  8. Encounter for Medicare annual wellness exam -due next year  21. Mixed hyperlipidemia -cont diet and exercise -statin intolerant  10. Medication management -due next visit  11. Vitamin D deficiency -cont Vit D   Over 30 minutes of exam, counseling, chart review, and critical decision making was performed  Future Appointments Date Time Provider [place]  07/11/2016 2:00 PM Unk Pinto, MD [facility] None     Plan:   During the course of the visit the patient was educated and counseled about appropriate screening and preventive services including:    Pneumococcal vaccine   Influenza vaccine  Prevnar 13  Td vaccine  Screening electrocardiogram  Colorectal cancer screening  Diabetes screening  Glaucoma screening  Nutrition counseling    Subjective:  Joshua Higgins is a 70 y.o. male who presents for Medicare Annual Wellness Visit and 3 month follow up for HTN, hyperlipidemia, prediabetes, and vitamin D Def.   His blood pressure has been controlled at home, today their BP is BP: 136/74 He does not  workout. He denies chest pain, shortness of breath, dizziness.  He is on cholesterol medication and denies myalgias. His cholesterol is at goal. The cholesterol last visit was:   Lab Results  Component Value Date   CHOL 160 05/02/2016   HDL 42 05/02/2016   [place] 95 05/02/2016   TRIG 115 05/02/2016   CHOLHDL 3.8 05/02/2016   He has been working on diet for his diabetes.  He does do housework but doesn't formally exercise . He is asymptomatic. :  Lab Results  Component Value Date   HGBA1C 6.6 (H) 02/12/2016   Last GFR Lab Results  Component Value Date   GFRNONAA 63 02/12/2016     Lab Results  Component Value Date   GFRAA 72 02/12/2016   Patient is on Vitamin D supplement.   Lab Results  Component Value Date   VD25OH 79 11/12/2015        Medication Review: Current Outpatient Prescriptions on File Prior to Visit  Medication Sig Dispense Refill  . acyclovir (ZOVIRAX) 800 MG tablet Take 1 tablet (800 mg total) by mouth 3 (three) times daily as needed (fever blister). 90 tablet 0  . aspirin 325 MG tablet Take 325 mg by mouth at bedtime.     Marland Kitchen atenolol (TENORMIN) 50 MG tablet Take 1 tablet (50 mg total) by mouth daily. 90 tablet 0  . Cholecalciferol 5000 UNITS TABS Take 1 tablet by mouth daily.    . IRON CR PO Take 1 tablet by mouth daily.    Marland Kitchen levothyroxine (SYNTHROID, LEVOTHROID) 50 MCG tablet TAKE 1 TABLET BY MOUTH DAILY 90 tablet 0  . metFORMIN (GLUCOPHAGE) 500 MG tablet TAKE 4 TABLETS BY MOUTH EVERY DAY 360 tablet 0  .  Multiple Vitamin (MULTIVITAMIN) tablet Take 1 tablet by mouth at bedtime.     . Omega-3 Fatty Acids (FISH OIL) 1000 MG CAPS Take 1,000 mg by mouth at bedtime.     . sildenafil (REVATIO) 20 MG tablet Take 1 tablet (20 mg total) by mouth 3 (three) times daily. 30 tablet 0  . valsartan (DIOVAN) 80 MG tablet TAKE 1/2 TO 1 TABLET BY MOUTH EVERY DAY FOR BLOOD PRESSURE AND KIDNEY PROTECTION 90 tablet 1   No current facility-administered medications on file  prior to visit.     Allergies: Allergies  Allergen Reactions  . Ace Inhibitors Cough  . Lipitor [Atorvastatin] Other (See Comments)    Joint pain.    Current Problems (verified) has [name] w/Stage 2 CKD (GFR 73 ml/min); Hyperlipidemia; Hypertension; GERD (gastroesophageal reflux disease); IBS (irritable bowel syndrome); Vitamin D deficiency; Medication management; Encounter for Medicare annual wellness exam; CAD s/p CABG; Hypothyroidism; and Atherosclerosis of abdominal aorta ([place]) on his problem list.  Screening Tests Immunization History  Administered Date(s) Administered  . DT 08/29/2003, 06/04/2015  . Influenza-Unspecified 04/16/2014, 03/30/2016  . Pneumococcal Conjugate-13 12/26/2013  . Pneumococcal Polysaccharide-23 01/29/2010  . Zoster 05/11/2010    Preventative care: Last colonoscopy: 2005  Names of Other Physician/Practitioners you currently use: 1. [facility] here for primary care 2. Dr. Gershon Crane, eye doctor, last visit 2017 3. Dr. Elnora Morrison, dentist, last visit 2017 Patient Care Team: Unk Pinto, MD as PCP - General (Internal Medicine) Rutherford Guys, MD as Consulting Physician (Ophthalmology) Kristeen Miss, MD as Consulting Physician (Neurosurgery) Sanda Klein, MD as Consulting Physician (Cardiology) Teena Irani, MD as Consulting Physician (Gastroenterology)  Surgical: He  has a past surgical history that includes Cardiac surgery; Knee surgery (Right); Back surgery; Coronary angioplasty (2001); Coronary artery bypass graft; Surgery scrotal / testicular; Elbow surgery (Left); Colonoscopy; and Lumbar laminectomy/decompression microdiscectomy (Right, 08/04/2014). Family His family history includes Arthritis in his mother; CAD in his brother and sister; Diabetes in his maternal grandmother; Heart attack in his brother and father; Hypertension in his brother; Ulcers in his mother. Social history  He reports that he quit smoking  about 42 years ago. He has never used smokeless tobacco. He reports that he does not drink alcohol or use drugs.  MEDICARE WELLNESS OBJECTIVES: Physical activity: Current Exercise Habits: The patient does not participate in regular exercise at present Cardiac risk factors: Cardiac Risk Factors include: advanced age (>18men, >22 women);hypertension;male gender;dyslipidemia;obesity (BMI >30kg/m2);sedentary lifestyle Depression/mood screen:   Depression screen [facility] 2/9 06/09/2016  Decreased Interest 0  Down, Depressed, Hopeless 0  PHQ - 2 Score 0    ADLs:  In your present state of health, do you have any difficulty performing the following activities: 06/09/2016 11/12/2015  Hearing? N N  Vision? N N  Difficulty concentrating or making decisions? N N  Walking or climbing stairs? N N  Dressing or bathing? N N  Doing errands, shopping? N N  Preparing Food and eating ? N -  Using the Toilet? N -  In the past six months, have you accidently leaked urine? N -  Do you have problems with loss of bowel control? N -  Managing your Medications? N -  Managing your Finances? N -  Housekeeping or managing your Housekeeping? N -  Some recent data might be hidden     Cognitive Testing  Alert? Yes  Normal Appearance?Yes  Oriented to person? Yes  Place? Yes   Time? Yes  Recall of three objects?  Yes  Can perform simple calculations? Yes  Displays appropriate judgment?Yes  Can read the correct time from a watch face?Yes  EOL planning: Does Patient Have a Medical Advance Directive?: Yes Type of Advance Directive: Healthcare Power of Attorney, Living will Does patient want to make changes to medical advance directive?: Yes (MAU/Ambulatory/Procedural Areas - Information given) Copy of [place] in Chart?: No - copy requested   Objective:   Today's Vitals   06/09/16 1057  BP: 136/74  Pulse: 72  Resp: 16  Temp: 98 F (36.7 C)  TempSrc: Temporal  Weight: 190 lb (86.2 kg)   Height: 5\' 9"  (1.753 m)   Body mass index is 28.06 kg/m.  General appearance: alert, no distress, WD/WN, male HEENT: normocephalic, sclerae anicteric, TMs pearly, nares patent, no discharge or erythema, pharynx normal Oral cavity: MMM, no lesions Neck: supple, no lymphadenopathy, no thyromegaly, no masses Heart: RRR, normal S1, S2, no murmurs Lungs: CTA bilaterally, no wheezes, rhonchi, or rales Abdomen: +bs, soft, non tender, non distended, no masses, no hepatomegaly, no splenomegaly Musculoskeletal: nontender, no swelling, no obvious deformity Extremities: no edema, no cyanosis, no clubbing Pulses: 2+ symmetric, upper and lower extremities, normal cap refill Neurological: alert, oriented x 3, CN2-12 intact, strength normal upper extremities and lower extremities, sensation normal throughout, DTRs 2+ throughout, no cerebellar signs, gait normal Psychiatric: normal affect, behavior normal, pleasant   Medicare Attestation I have personally reviewed: The patient's medical and social history Their use of alcohol, tobacco or illicit drugs Their current medications and supplements The patient's functional ability including ADLs,fall risks, home safety risks, cognitive, and hearing and visual impairment Diet and physical activities Evidence for depression or mood disorders  The patient's weight, height, BMI, and visual acuity have been recorded in the chart.  I have made referrals, counseling, and provided education to the patient based on review of the above and I have provided the patient with a written personalized care plan for preventive services.     Starlyn Skeans, PA-C   06/09/2016

## 2016-06-15 ENCOUNTER — Telehealth: Payer: Self-pay | Admitting: Cardiovascular Disease

## 2016-06-15 NOTE — Telephone Encounter (Signed)
Spoke to patient who had concern for muscle ache and fatigue he developed on Crestor. Was unable to move w/o discomfort. He had this experience on other statins, and notes Crestor is the 3rd statin he's tried/failed due to intolerance. He's been off the medication for 3 weeks now and feels completely asymptomatic at this time. He would like some advice on what to do. Notes that if he does not need to take a statin, this would be his preference. Will defer to provider for options.

## 2016-06-15 NOTE — Telephone Encounter (Signed)
Understood. Will try to bring him in to see one of our clinical pharmacists to see if he is eligible for a new type of medication - Repatha or Praluent - that does not have the muscle side effects of statins. It is still very important to try to lower his cholesterol levels to avoid need for redo CABG or stents or a\other vascular complications. Can we please  Schedule him with Claiborne Billings or Erasmo Downer? Thanks, [employer]

## 2016-06-15 NOTE — Telephone Encounter (Signed)
New message    Pt verbalized that he need to tell rn that he stopped crestor it gave him back and body pain   Pt c/o medication issue:  1. Name of Medication: crestor 2. How are you currently taking this medication (dosage and times per day)? 1x day 5mg   3. Are you having a reaction (difficulty breathing--STAT)? no 4. What is your medication issue? Body aches

## 2016-06-16 ENCOUNTER — Ambulatory Visit (INDEPENDENT_AMBULATORY_CARE_PROVIDER_SITE_OTHER): Payer: Medicare Other | Admitting: Cardiovascular Disease

## 2016-06-16 ENCOUNTER — Encounter: Payer: Self-pay | Admitting: Cardiovascular Disease

## 2016-06-16 VITALS — BP 130/76 | HR 67 | Ht 70.0 in | Wt 190.0 lb

## 2016-06-16 DIAGNOSIS — I209 Angina pectoris, unspecified: Secondary | ICD-10-CM | POA: Diagnosis not present

## 2016-06-16 DIAGNOSIS — Z79899 Other long term (current) drug therapy: Secondary | ICD-10-CM

## 2016-06-16 DIAGNOSIS — E785 Hyperlipidemia, unspecified: Secondary | ICD-10-CM | POA: Diagnosis not present

## 2016-06-16 DIAGNOSIS — I10 Essential (primary) hypertension: Secondary | ICD-10-CM

## 2016-06-16 DIAGNOSIS — I25708 Atherosclerosis of coronary artery bypass graft(s), unspecified, with other forms of angina pectoris: Secondary | ICD-10-CM | POA: Diagnosis not present

## 2016-06-16 DIAGNOSIS — E1159 Type 2 diabetes mellitus with other circulatory complications: Secondary | ICD-10-CM

## 2016-06-16 MED ORDER — ROSUVASTATIN CALCIUM 5 MG PO TABS: 5.0000 mg | ORAL_TABLET | ORAL | 11 refills | Status: DC

## 2016-06-16 NOTE — Telephone Encounter (Signed)
Spoke with patient.  He was unable to tolerate Crestor 5 mg daily, so after several weeks off, he is now trying 5 mg twice weekly.  He is to continue this for 3 months, then have labs re-drawn and see [place] afterward.  Will call him in early Feb to schedule for later that month.  Patient voiced understanding.

## 2016-06-16 NOTE — Patient Instructions (Signed)
Medication Instructions: Dr Sallyanne Kuster has recommended making the following medication changes: 1. RESTART Crestor 5 mg twice a week 2. START Co Q 10 300 mg - take 1 capsule/tablet by mouth once daily  >>This is an over-the-counter medication  Labwork: Your physician recommends that you return for lab work in 3 months. Please have the blood work done at least 1 week before appointment with the pharmacist.  Testing/Procedures: NONE ORDERED  Follow-up: Your physician recommends that you schedule a follow-up appointment in 3 months with a pharmacist in the lipid clinic.  Dr Sallyanne Kuster recommends that you schedule a follow-up appointment in 12 months. You will receive a reminder letter in the mail two months in advance. If you don't receive a letter, please call our office to schedule the follow-up appointment.  If you need a refill on your cardiac medications before your next appointment, please call your pharmacy.

## 2016-06-16 NOTE — Progress Notes (Signed)
Patient ID: Joshua Higgins, male   DOB: 1945-08-19, 70 y.o.   MRN: [id number]    Cardiology Office Note    Date:  06/16/2016   ID:  Joshua Higgins, DOB 06/24/1946, MRN [id number]  PCP:  Alesia Richards, MD  Cardiologist:   Sanda Klein, MD   Chief Complaint  Patient presents with  . Follow-up    History of Present Illness:  Joshua Higgins is a 70 y.o. male who is now roughly 9 years after coronary bypass surgery (LIMA to LAD, SVG to ramus intermedius and OM, SVG to acute marginal branch of RCA), and also has a history of hyperlipidemia, hypertension, type 2 diabetes mellitus controlled with oral antidiabetics, erectile dysfunction, migraines.  Chest discomfort has occurred very infrequently and only with very intense physical activity. Over the summer and fall, he mowed his lawn with a push mower without difficulty. He underwent a treadmill stress test in March. There was subtle ST segment depression in the inferior leads, less than 1 mm in amplitude.  He has slight orthostatic dizziness but denies syncope or palpitations. Has not had exertional dyspnea, leg edema, claudication or focal neurological complaints.  He has had a lot of muscle discomfort, back pain, thigh cramps at night. All these symptoms quickly resolved when he stopped taking rosuvastatin. While on rosuvastatin 5 mg daily his LDL was less than 70, of medication his LDL is 95.  Past Medical History:  Diagnosis Date  . Anemia    low iron  . Arthritis   . Cancer ([place])    skin cancer on face  . Cataracts, bilateral   . Coronary artery disease    CABG - 2008  . Diabetes mellitus without complication ([place])    type II  . GERD (gastroesophageal reflux disease)   . Hepatitis   . Hyperlipidemia   . Hypertension    pt states he does not have HTN, takes Atenolol for migraines  . Hypothyroidism   . Migraine headache with aura    takes Atenolol  . Sleep apnea    does not use cpap    Past Surgical  History:  Procedure Laterality Date  . BACK SURGERY    . CARDIAC SURGERY     Bypass  . COLONOSCOPY    . CORONARY ANGIOPLASTY  2001  . CORONARY ARTERY BYPASS GRAFT    . ELBOW SURGERY Left    due to damaged nerve  . KNEE SURGERY Right    arthroscopic  . LUMBAR LAMINECTOMY/DECOMPRESSION MICRODISCECTOMY Right 08/04/2014   Procedure: Right Lumbar four-five Microdiskectomy;  Surgeon: Kristeen Miss, MD;  Location: [facility];  Service: Neurosurgery;  Laterality: Right;  Right L4-5 Microdiskectomy  . SURGERY SCROTAL / TESTICULAR     as a child/teenager    Outpatient Medications Prior to Visit  Medication Sig Dispense Refill  . acyclovir (ZOVIRAX) 800 MG tablet Take 1 tablet (800 mg total) by mouth 3 (three) times daily as needed (fever blister). 90 tablet 0  . aspirin 325 MG tablet Take 325 mg by mouth at bedtime.     Marland Kitchen atenolol (TENORMIN) 50 MG tablet Take 1 tablet (50 mg total) by mouth daily. 90 tablet 0  . Cholecalciferol 5000 UNITS TABS Take 1 tablet by mouth daily.    . IRON CR PO Take 1 tablet by mouth daily.    Marland Kitchen levothyroxine (SYNTHROID, LEVOTHROID) 50 MCG tablet TAKE 1 TABLET BY MOUTH DAILY 90 tablet 0  . metFORMIN (GLUCOPHAGE) 500 MG tablet TAKE 4 TABLETS  BY MOUTH EVERY DAY 360 tablet 0  . Multiple Vitamin (MULTIVITAMIN) tablet Take 1 tablet by mouth at bedtime.     . Omega-3 Fatty Acids (FISH OIL) 1000 MG CAPS Take 1,000 mg by mouth at bedtime.     . valsartan (DIOVAN) 80 MG tablet TAKE 1/2 TO 1 TABLET BY MOUTH EVERY DAY FOR BLOOD PRESSURE AND KIDNEY PROTECTION 90 tablet 1  . sildenafil (REVATIO) 20 MG tablet Take 1 tablet (20 mg total) by mouth 3 (three) times daily. (Patient not taking: Reported on 06/16/2016) 30 tablet 0   No facility-administered medications prior to visit.      Allergies:   Ace inhibitors and Lipitor [atorvastatin]   Social History   Social History  . Marital status: Married    Spouse name: N/A  . Number of children: N/A  . Years of education: N/A     Social History Main Topics  . Smoking status: Former Smoker    Quit date: 10/01/1973  . Smokeless tobacco: Never Used  . Alcohol use No  . Drug use: No  . Sexual activity: Not Asked   Other Topics Concern  . None   Social History Narrative  . None     Family History:  The patient's family history includes Arthritis in his mother; CAD in his brother and sister; Diabetes in his maternal grandmother; Heart attack in his brother and father; Hypertension in his brother; Ulcers in his mother.   ROS:   Please see the history of present illness.    ROS All other systems reviewed and are negative.   PHYSICAL EXAM:   VS:  BP 130/76 (BP Location: Right Arm, Patient Position: Sitting, Cuff Size: Normal)   Pulse 67   Ht 5\' 10"  (1.778 m)   Wt 190 lb (86.2 kg)   BMI 27.26 kg/m    GEN: Well nourished, well developed, in no acute distress  HEENT: normal  Neck: no JVD, carotid bruits, or masses Cardiac: RRR; no murmurs, rubs, or gallops,no edema  Respiratory:  clear to auscultation bilaterally, normal work of breathing GI: soft, nontender, nondistended, + BS MS: no deformity or atrophy  Skin: warm and dry, no rash Neuro:  Alert and Oriented x 3, Strength and sensation are intact Psych: euthymic mood, full affect  Wt Readings from Last 3 Encounters:  06/16/16 190 lb (86.2 kg)  06/09/16 190 lb (86.2 kg)  05/25/16 190 lb 6.4 oz (86.4 kg)      Studies/Labs Reviewed:   EKG:  EKG is ordered today.  The ekg ordered today demonstrates normal sinus rhythm with minor nonspecific ST-T changes in the inferior and lateral leads, not much change from previous tracings  Recent Labs: 02/12/2016: ALT 23; BUN 17; Creat 1.16; Hemoglobin 13.6; Magnesium 2.1; Platelets 189; Potassium 4.6; Sodium 141; TSH 1.91   Lipid Panel    Component Value Date/Time   CHOL 160 05/02/2016 1610   TRIG 115 05/02/2016 1610   HDL 42 05/02/2016 1610   CHOLHDL 3.8 05/02/2016 1610   VLDL 23 05/02/2016 1610    LDLCALC 95 05/02/2016 1610      ASSESSMENT:    1. Coronary artery disease involving coronary bypass graft of native heart with other forms of angina pectoris ([place])   2. Essential hypertension   3. Type 2 diabetes mellitus with other circulatory complication, without long-term current use of insulin ([place])   4. Dyslipidemia   5. Medication management      PLAN:  In order of problems listed above:  1. CAD s/p CABG: He has very infrequent CCS class II exertional angina pectoris. The symptoms are quite mild. He is taking beta blockers. He cannot take nitrates since he uses sildenafil occasionally. Equivocal abnormalities in March on ECG stress test.  2. HTN: Well controlled. His beta blocker has also been useful for migraine or tension. 3. DM: Well controlled, most recent hemoglobin A1c 6.6%. On valsartan for renal protection 4. HLP: LDL in target range on statin therapy, Unable to tolerate daily rosuvastatin. Will try to dose twice weekly and add high-dose coenzyme every 10. Recheck labs in 3 months. If not at target LDL under 70, will try to get coverage for repatha or praluent    Medication Adjustments/Labs and Tests Ordered: Current medicines are reviewed at length with the patient today.  Concerns regarding medicines are outlined above.  Medication changes, Labs and Tests ordered today are listed in the Patient Instructions below. Patient Instructions  Medication Instructions: Dr Sallyanne Kuster has recommended making the following medication changes: 1. RESTART Crestor 5 mg twice a week 2. START Co Q 10 300 mg - take 1 capsule/tablet by mouth once daily  >>This is an over-the-counter medication  Labwork: Your physician recommends that you return for lab work in 3 months. Please have the blood work done at least 1 week before appointment with the pharmacist.  Testing/Procedures: NONE ORDERED  Follow-up: Your physician recommends that you schedule a follow-up appointment in 3 months  with a pharmacist in the lipid clinic.  Dr Sallyanne Kuster recommends that you schedule a follow-up appointment in 12 months. You will receive a reminder letter in the mail two months in advance. If you don't receive a letter, please call our office to schedule the follow-up appointment.  If you need a refill on your cardiac medications before your next appointment, please call your pharmacy.      Signed, Sanda Klein, MD  06/16/2016 9:18 AM    [facility] [address], [place], [place]  [address] Phone: [phone]; Fax: [phone]

## 2016-06-28 ENCOUNTER — Other Ambulatory Visit: Payer: Self-pay | Admitting: Internal Medicine

## 2016-07-04 HISTORY — PX: CATARACT EXTRACTION, BILATERAL: SHX1313

## 2016-07-11 ENCOUNTER — Encounter: Payer: Self-pay | Admitting: Internal Medicine

## 2016-07-11 ENCOUNTER — Ambulatory Visit (INDEPENDENT_AMBULATORY_CARE_PROVIDER_SITE_OTHER): Payer: Medicare Other | Admitting: Internal Medicine

## 2016-07-11 VITALS — BP 120/58 | HR 76 | Temp 97.3°F | Resp 16 | Ht 68.5 in | Wt 189.0 lb

## 2016-07-11 DIAGNOSIS — Z136 Encounter for screening for cardiovascular disorders: Secondary | ICD-10-CM | POA: Diagnosis not present

## 2016-07-11 DIAGNOSIS — Z1212 Encounter for screening for malignant neoplasm of rectum: Secondary | ICD-10-CM

## 2016-07-11 DIAGNOSIS — N320 Bladder-neck obstruction: Secondary | ICD-10-CM

## 2016-07-11 DIAGNOSIS — E559 Vitamin D deficiency, unspecified: Secondary | ICD-10-CM

## 2016-07-11 DIAGNOSIS — I10 Essential (primary) hypertension: Secondary | ICD-10-CM

## 2016-07-11 DIAGNOSIS — I25810 Atherosclerosis of coronary artery bypass graft(s) without angina pectoris: Secondary | ICD-10-CM

## 2016-07-11 DIAGNOSIS — Z79899 Other long term (current) drug therapy: Secondary | ICD-10-CM

## 2016-07-11 DIAGNOSIS — N182 Chronic kidney disease, stage 2 (mild): Secondary | ICD-10-CM | POA: Diagnosis not present

## 2016-07-11 DIAGNOSIS — Z125 Encounter for screening for malignant neoplasm of prostate: Secondary | ICD-10-CM

## 2016-07-11 DIAGNOSIS — E1122 Type 2 diabetes mellitus with diabetic chronic kidney disease: Secondary | ICD-10-CM | POA: Diagnosis not present

## 2016-07-11 DIAGNOSIS — I700 Atherosclerosis of aorta: Secondary | ICD-10-CM

## 2016-07-11 DIAGNOSIS — E782 Mixed hyperlipidemia: Secondary | ICD-10-CM | POA: Diagnosis not present

## 2016-07-11 DIAGNOSIS — E039 Hypothyroidism, unspecified: Secondary | ICD-10-CM

## 2016-07-11 LAB — CBC WITH DIFFERENTIAL/PLATELET
Basophils Absolute: 0 cells/uL (ref 0–200)
Basophils Relative: 0 %
EOS ABS: 285 {cells}/uL (ref 15–500)
Eosinophils Relative: 5 %
HEMATOCRIT: 42.5 % (ref 38.5–50.0)
Hemoglobin: 14.3 g/dL (ref 13.2–17.1)
LYMPHS PCT: 40 %
Lymphs Abs: 2280 cells/uL (ref 850–3900)
MCH: 33.8 pg — AB (ref 27.0–33.0)
MCHC: 33.6 g/dL (ref 32.0–36.0)
MCV: 100.5 fL — AB (ref 80.0–100.0)
MONO ABS: 456 {cells}/uL (ref 200–950)
MPV: 9.8 fL (ref 7.5–12.5)
Monocytes Relative: 8 %
NEUTROS PCT: 47 %
Neutro Abs: 2679 cells/uL (ref 1500–7800)
Platelets: 190 10*3/uL (ref 140–400)
RBC: 4.23 MIL/uL (ref 4.20–5.80)
RDW: 13.3 % (ref 11.0–15.0)
WBC: 5.7 10*3/uL (ref 3.8–10.8)

## 2016-07-11 LAB — HEPATIC FUNCTION PANEL
ALBUMIN: 4.5 g/dL (ref 3.6–5.1)
ALK PHOS: 47 U/L (ref 40–115)
ALT: 25 U/L (ref 9–46)
AST: 20 U/L (ref 10–35)
Bilirubin, Direct: 0.1 mg/dL (ref <=0.2)
Indirect Bilirubin: 0.5 mg/dL (ref 0.2–1.2)
TOTAL PROTEIN: 7 g/dL (ref 6.1–8.1)
Total Bilirubin: 0.6 mg/dL (ref 0.2–1.2)

## 2016-07-11 LAB — LIPID PANEL
CHOLESTEROL: 146 mg/dL (ref <200)
HDL: 42 mg/dL (ref >40)
LDL CALC: 76 mg/dL (ref <100)
TRIGLYCERIDES: 139 mg/dL (ref <150)
Total CHOL/HDL Ratio: 3.5 Ratio (ref <5.0)
VLDL: 28 mg/dL (ref <30)

## 2016-07-11 LAB — HEMOGLOBIN A1C
Hgb A1c MFr Bld: 6.4 % — ABNORMAL HIGH (ref <5.7)
MEAN PLASMA GLUCOSE: 137 mg/dL

## 2016-07-11 LAB — PSA: PSA: 0.7 ng/mL (ref <=4.0)

## 2016-07-11 LAB — TSH: TSH: 1.85 mIU/L (ref 0.40–4.50)

## 2016-07-11 LAB — BASIC METABOLIC PANEL WITH GFR
BUN: 16 mg/dL (ref 7–25)
CALCIUM: 10.1 mg/dL (ref 8.6–10.3)
CO2: 29 mmol/L (ref 20–31)
Chloride: 102 mmol/L (ref 98–110)
Creat: 1.16 mg/dL (ref 0.70–1.18)
GFR, EST AFRICAN AMERICAN: 72 mL/min (ref >=60)
GFR, Est Non African American: 63 mL/min (ref >=60)
GLUCOSE: 114 mg/dL — AB (ref 65–99)
Potassium: 5.2 mmol/L (ref 3.5–5.3)
Sodium: 141 mmol/L (ref 135–146)

## 2016-07-11 LAB — MAGNESIUM: Magnesium: 2 mg/dL (ref 1.5–2.5)

## 2016-07-11 MED ORDER — EZETIMIBE 10 MG PO TABS: 10.0000 mg | ORAL_TABLET | Freq: Every day | ORAL | 1 refills | Status: DC

## 2016-07-11 NOTE — Patient Instructions (Signed)

## 2016-07-11 NOTE — Progress Notes (Signed)
[facility]   Unk Pinto, M.D.    Uvaldo Bristle. Silverio Lay, P.A.-C      Starlyn Skeans, P.A.-C  [facility]                [address] [place], [place] [id number] Telephone [phone] Telefax [phone]  Comprehensive Evaluation & Examination     This very nice 71 y.o. MWM presents for a  comprehensive evaluation and management of multiple medical co-morbidities.  Patient has been followed for HTN, ASCAD/CABG, [name]/CKD2, Hyperlipidemia and Vitamin D Deficiency.     HTN predates since 1989. Patient's BP has been controlled at home. In 2001 patient presented with ACS and had PCA/PTCA and then did well until 2008 when he underwent a CABG.  In 2013 & 2014, he had a negative Cardiolite scan and in Mar 2017 he had a negative ETT by Dr Sallyanne Kuster.  Today's BP is at goal 120/58. Patient denies any cardiac symptoms as chest pain, palpitations, shortness of breath, dizziness or ankle swelling.     Patient's hyperlipidemia is controlled with diet and medications. Patient  Has hx/o severe disabling myalgias on low dose Statins and has just failed a re-trial on low dose alternate day Crestor due to severe disabling muscle aches in his thighs despite taking concommitant Co-Q10.  Last lipids were not at goal of LDL<70:  Lab Results  Component Value Date   CHOL 160 05/02/2016   HDL 42 05/02/2016   LDLCALC 95 05/02/2016   TRIG 115 05/02/2016   CHOLHDL 3.8 05/02/2016      Patient has [name] w/CKD2 since 2000 and patient denies reactive hypoglycemic symptoms, visual blurring, diabetic polys or paresthesias. Last A1c was not at goal: Lab Results  Component Value Date   HGBA1C 6.6 (H) 02/12/2016       Patient has been on Thyroid Replacement since 2013. Finally, patient has history of Vitamin D Deficiency in 2008  of "33" and last vitamin D was at goal: Lab Results  Component Value Date   VD25OH 79 11/12/2015    Current Outpatient Prescriptions on File Prior to Visit  Medication Sig  . acyclovir (ZOVIRAX) 800 MG tablet Take 1 tablet (800 mg total) by mouth 3 (three) times daily as needed (fever blister).  Marland Kitchen aspirin 325 MG tablet Take 325 mg by mouth at bedtime.   Marland Kitchen atenolol (TENORMIN) 50 MG tablet Take 1 tablet (50 mg total) by mouth daily.  . Cholecalciferol 5000 UNITS TABS Take 1 tablet by mouth daily.  . IRON CR PO Take 1 tablet by mouth daily.  Marland Kitchen levothyroxine (SYNTHROID, LEVOTHROID) 50 MCG tablet TAKE 1 TABLET BY MOUTH DAILY  . metFORMIN (GLUCOPHAGE) 500 MG tablet TAKE 4 TABLETS BY MOUTH EVERY DAY  . Multiple Vitamin (MULTIVITAMIN) tablet Take 1 tablet by mouth at bedtime.   . Omega-3 Fatty Acids (FISH OIL) 1000 MG CAPS Take 1,000 mg by mouth at bedtime.   . valsartan (DIOVAN) 80 MG tablet TAKE 1/2 TO 1 TABLET BY MOUTH EVERY DAY FOR BLOOD PRESSURE AND KIDNEY PROTECTION   No current facility-administered medications on file prior to visit.    Allergies  Allergen Reactions  . Ace Inhibitors Cough  . Lipitor [Atorvastatin] Other (See Comments)    Joint pain.   Past Medical History:  Diagnosis Date  . Anemia    low iron  . Arthritis   . Cancer (  [place])    skin cancer on face  . Cataracts, bilateral   . Coronary artery disease    CABG - 2008  . Diabetes mellitus without complication ([place])    type II  . GERD (gastroesophageal reflux disease)   . Hepatitis   . Hyperlipidemia   . Hypertension    pt states he does not have HTN, takes Atenolol for migraines  . Hypothyroidism   . Migraine headache with aura    takes Atenolol  . Sleep apnea    does not use cpap   Health Maintenance  Topic Date Due  . Hepatitis C Screening  03-26-1946  . TETANUS/TDAP  11/07/1962  . COLONOSCOPY  11/06/1993  . HEMOGLOBIN A1C  08/14/2016  . OPHTHALMOLOGY EXAM  10/14/2016  . FOOT EXAM  11/11/2016  . INFLUENZA VACCINE  Completed  . ZOSTAVAX  Completed  . PNA vac Low Risk Adult  Completed    Immunization History  Administered Date(s) Administered  . DT 08/29/2003, 06/04/2015  . Influenza-Unspecified 04/16/2014, 03/30/2016  . Pneumococcal Conjugate-13 12/26/2013  . Pneumococcal Polysaccharide-23 01/29/2010  . Zoster 05/11/2010   Past Surgical History:  Procedure Laterality Date  . BACK SURGERY    . CARDIAC SURGERY     Bypass  . COLONOSCOPY    . CORONARY ANGIOPLASTY  2001  . CORONARY ARTERY BYPASS GRAFT    . ELBOW SURGERY Left    due to damaged nerve  . KNEE SURGERY Right    arthroscopic  . LUMBAR LAMINECTOMY/DECOMPRESSION MICRODISCECTOMY Right 08/04/2014   Procedure: Right Lumbar four-five Microdiskectomy;  Surgeon: Kristeen Miss, MD;  Location: [facility];  Service: Neurosurgery;  Laterality: Right;  Right L4-5 Microdiskectomy  . SURGERY SCROTAL / TESTICULAR     as a child/teenager   Family History  Problem Relation Age of Onset  . Ulcers Mother   . Arthritis Mother   . Heart attack Father   . CAD Sister   . Heart attack Brother   . CAD Brother   . Hypertension Brother   . Diabetes Maternal Grandmother    Social History   Social History  . Marital status: Married    Spouse name: N/A  . Number of children: N/A  . Years of education: N/A   Occupational History  . Retired Engineer, technical sales    Social History Main Topics  . Smoking status: Former Smoker    Quit date: 10/01/1973  . Smokeless tobacco: Never Used  . Alcohol use No  . Drug use: No  . Sexual activity: Not active    ROS Constitutional: Denies fever, chills, weight loss/gain, headaches, insomnia,  night sweats or change in appetite. Does c/o fatigue. Eyes: Denies redness, blurred vision, diplopia, discharge, itchy or watery eyes.  ENT: Denies discharge, congestion, post nasal drip, epistaxis, sore throat, earache, hearing loss, dental pain, Tinnitus, Vertigo, Sinus pain or snoring.  Cardio: Denies chest pain, palpitations, irregular heartbeat, syncope, dyspnea, diaphoresis, orthopnea, PND, claudication  or edema Respiratory: denies cough, dyspnea, DOE, pleurisy, hoarseness, laryngitis or wheezing.  Gastrointestinal: Denies dysphagia, heartburn, reflux, water brash, pain, cramps, nausea, vomiting, bloating, diarrhea, constipation, hematemesis, melena, hematochezia, jaundice or hemorrhoids Genitourinary: Denies dysuria, frequency, urgency, nocturia, hesitancy, discharge, hematuria or flank pain Musculoskeletal: Denies arthralgia, myalgia, stiffness, Jt. Swelling, pain, limp or strain/sprain. Denies Falls. Skin: Denies puritis, rash, hives, warts, acne, eczema or change in skin lesion Neuro: No weakness, tremor, incoordination, spasms, paresthesia or pain Psychiatric: Denies confusion, memory loss or sensory loss. Denies Depression. Endocrine: Denies change in weight, skin,  hair change, nocturia, and paresthesia, diabetic polys, visual blurring or hyper / hypo glycemic episodes.  Heme/Lymph: No excessive bleeding, bruising or enlarged lymph nodes.  Physical Exam  BP (!) 120/58   Pulse 76   Temp 97.3 F (36.3 C)   Resp 16   Ht 5' 8.5" (1.74 m)   Wt 189 lb (85.7 kg)   BMI 28.32 kg/m   General Appearance: Well nourished, in no apparent distress.  Eyes: PERRLA, EOMs, conjunctiva no swelling or erythema, normal fundi and vessels. Sinuses: No frontal/maxillary tenderness ENT/Mouth: EACs patent / TMs  nl. Nares clear without erythema, swelling, mucoid exudates. Oral hygiene is good. No erythema, swelling, or exudate. Tongue normal, non-obstructing. Tonsils not swollen or erythematous. Hearing normal.  Neck: Supple, thyroid normal. No bruits, nodes or JVD. Respiratory: Respiratory effort normal.  BS equal and clear bilateral without rales, rhonci, wheezing or stridor. Cardio: Heart sounds are normal with regular rate and rhythm and no murmurs, rubs or gallops. Peripheral pulses are normal and equal bilaterally without edema. No aortic or femoral bruits. Chest: symmetric with normal excursions  and percussion.  Abdomen: Soft, with Nl bowel sounds. Nontender, no guarding, rebound, hernias, masses, or organomegaly.  Lymphatics: Non tender without lymphadenopathy.  Genitourinary: No hernias.Testes nl. DRE - prostate nl for age - smooth & firm w/o nodules. Musculoskeletal: Full ROM all peripheral extremities, joint stability, 5/5 strength, and normal gait. Skin: Warm and dry without rashes, lesions, cyanosis, clubbing or  ecchymosis.  Neuro: Cranial nerves intact, reflexes equal bilaterally. Normal muscle tone, no cerebellar symptoms. Sensation intact.  Pysch: Alert and oriented X 3 with normal affect, insight and judgment appropriate.   Assessment and Plan  1. Essential hypertension  - Microalbumin / creatinine urine ratio - EKG 12-Lead - [place], RETROPERITNL ABD,  LTD - CBC with Differential/Platelet - BASIC METABOLIC PANEL WITH GFR - TSH - Urinalysis, Routine w reflex microscopic  2. Mixed hyperlipidemia  - Patient d/c'd Low dose alternate day Crestor due to severe myalgias. - EKG 12-Lead - [place], RETROPERITNL ABD,  LTD - Hepatic function panel - Lipid panel - TSH  -Trial on  ezetimibe (ZETIA) 10 MG ; Take 1 tab daily.  Disp: 90 tab; Rf: 1  3. Type 2 diabetes mellitus with stage 2 chronic kidney disease, without long-term current use of insulin (HCC)  - Microalbumin / creatinine urine ratio - EKG 12-Lead - [place], RETROPERITNL ABD,  LTD - HM DIABETES FOOT EXAM - LOW EXTREMITY NEUR EXAM DOCUM - Hemoglobin A1c - Insulin, random  4. Vitamin D deficiency  - VITAMIN D 25 Hydroxy (Vit-D Deficiency, Fractures)  5. Coronary artery disease involving coronary bypass graft of native heart, angina presence unspecified  - ezetimibe (ZETIA) 10 MG; Take 1 tab daily.  Disp: 90 ; Rf: 1  6. Hypothyroidism   7. Atherosclerosis of abdominal aorta (HCC)  - [place], RETROPERITNL ABD,  LTD  8. Bladder neck obstruction  - PSA  9. Screening for prostate cancer  - PSA  10. Screening  for rectal cancer  - POC Hemoccult Bld/Stl (  11. Screening for ischemic heart disease  - EKG 12-Lead  12. Screening for AAA (aortic abdominal aneurysm)  - [place], RETROPERITNL ABD,  LTD  13. Medication management  - CBC with Differential/Platelet - BASIC METABOLIC PANEL WITH GFR - Hepatic function panel - Magnesium - Urinalysis, Routine w reflex microscopic       Continue prudent diet as discussed, weight control, BP monitoring, regular exercise, and medications as discussed.  Discussed med effects and SE's. Routine screening labs and tests as requested with regular follow-up as recommended. Over 40 minutes of exam, counseling, chart review and high complex critical decision making was performed

## 2016-07-12 LAB — URINALYSIS, ROUTINE W REFLEX MICROSCOPIC
BILIRUBIN URINE: NEGATIVE
Glucose, UA: NEGATIVE
HGB URINE DIPSTICK: NEGATIVE
KETONES UR: NEGATIVE
Leukocytes, UA: NEGATIVE
NITRITE: NEGATIVE
Protein, ur: NEGATIVE
Specific Gravity, Urine: 1.018 (ref 1.001–1.035)
pH: 5.5 (ref 5.0–8.0)

## 2016-07-12 LAB — INSULIN, RANDOM: INSULIN: 16.2 u[IU]/mL (ref 2.0–19.6)

## 2016-07-12 LAB — MICROALBUMIN / CREATININE URINE RATIO
CREATININE, URINE: 131 mg/dL (ref 20–370)
MICROALB UR: 0.9 mg/dL
Microalb Creat Ratio: 7 mcg/mg creat (ref <30)

## 2016-07-12 LAB — VITAMIN D 25 HYDROXY (VIT D DEFICIENCY, FRACTURES): Vit D, 25-Hydroxy: 81 ng/mL (ref 30–100)

## 2016-07-22 ENCOUNTER — Other Ambulatory Visit: Payer: Self-pay | Admitting: Physician Assistant

## 2016-07-24 ENCOUNTER — Other Ambulatory Visit: Payer: Self-pay | Admitting: Internal Medicine

## 2016-07-24 MED ORDER — LEVOTHYROXINE SODIUM 50 MCG PO TABS: ORAL_TABLET | ORAL | 1 refills | Status: DC

## 2016-07-24 MED ORDER — METFORMIN HCL 500 MG PO TABS: ORAL_TABLET | ORAL | 1 refills | Status: DC

## 2016-08-10 DIAGNOSIS — Z1212 Encounter for screening for malignant neoplasm of rectum: Secondary | ICD-10-CM | POA: Diagnosis not present

## 2016-08-10 DIAGNOSIS — Z1211 Encounter for screening for malignant neoplasm of colon: Secondary | ICD-10-CM | POA: Diagnosis not present

## 2016-08-17 ENCOUNTER — Other Ambulatory Visit: Payer: Self-pay | Admitting: Internal Medicine

## 2016-08-17 DIAGNOSIS — R195 Other fecal abnormalities: Secondary | ICD-10-CM

## 2016-08-17 LAB — COLOGUARD

## 2016-08-30 ENCOUNTER — Ambulatory Visit: Payer: Medicare Other

## 2016-09-01 DIAGNOSIS — R195 Other fecal abnormalities: Secondary | ICD-10-CM | POA: Diagnosis not present

## 2016-09-07 DIAGNOSIS — K6389 Other specified diseases of intestine: Secondary | ICD-10-CM | POA: Diagnosis not present

## 2016-09-07 DIAGNOSIS — R195 Other fecal abnormalities: Secondary | ICD-10-CM | POA: Diagnosis not present

## 2016-10-03 ENCOUNTER — Encounter: Payer: Self-pay | Admitting: *Deleted

## 2016-10-17 ENCOUNTER — Encounter: Payer: Self-pay | Admitting: Internal Medicine

## 2016-10-20 DIAGNOSIS — H5203 Hypermetropia, bilateral: Secondary | ICD-10-CM | POA: Diagnosis not present

## 2016-10-20 DIAGNOSIS — E119 Type 2 diabetes mellitus without complications: Secondary | ICD-10-CM | POA: Diagnosis not present

## 2016-10-20 DIAGNOSIS — H524 Presbyopia: Secondary | ICD-10-CM | POA: Diagnosis not present

## 2016-10-20 DIAGNOSIS — H2513 Age-related nuclear cataract, bilateral: Secondary | ICD-10-CM | POA: Diagnosis not present

## 2016-10-20 LAB — HM DIABETES EYE EXAM

## 2016-10-26 DIAGNOSIS — H2513 Age-related nuclear cataract, bilateral: Secondary | ICD-10-CM | POA: Diagnosis not present

## 2016-10-27 ENCOUNTER — Ambulatory Visit: Payer: Self-pay | Admitting: Internal Medicine

## 2016-11-09 NOTE — Progress Notes (Signed)
Patient ID: Joshua Higgins, male   DOB: 11-May-1946, 71 y.o.   MRN: [id number]  Assessment and Plan:  Hypertension:  -Continue medication -monitor blood pressure at home. -Continue DASH diet -Reminder to go to the ER if any CP, SOB, nausea, dizziness, severe HA, changes vision/speech, left arm numbness and tingling and jaw pain.  Cholesterol - Continue diet and exercise -Check cholesterol.   Diabetes with diabetic chronic kidney disease -Continue diet and exercise.  -Check A1C  Vitamin D Def -check level -continue medications.  Hypothyroidism -check TSH level, continue medications the same, reminded to take on an empty stomach 30-34mins before food.    Atherosclerosis of aorta Control blood pressure, cholesterol, glucose, increase exercise.   Actinic keratosis Will do 5 FU during winter, discussed with patient Will do freeze next [place] and meds as discussed. Further disposition pending results of labs. Discussed med's effects and SE's.   Future Appointments Date Time Provider [place]  01/31/2017 9:30 AM Unk Pinto, MD [facility] None  08/14/2017 2:00 PM Unk Pinto, MD [facility] None   Set up 3 month for wellness  HPI 71 y.o. male  presents for 3 month follow up with hypertension, hyperlipidemia, diabetes and vitamin D deficiency.   His blood pressure has been controlled at home, today their BP is BP: 120/70.He does not workout but has been doing yard work, cutting down a tree. He denies chest pain, shortness of breath, dizziness. He has history of PTCA in 2001 and CABG in 2008, follows with Dr. Benn Moulder.    He is on cholesterol medication and denies myalgias. His cholesterol is at goal. The cholesterol was:  07/11/2016: Cholesterol 146; HDL 42; LDL Cholesterol 76; Triglycerides 139   He has been working on diet and exercise for diabetes with diabetic chronic kidney disease, he is on ASA 325mg , he is on ACE/ARB, and denies  foot  ulcerations, hyperglycemia, hypoglycemia , increased appetite, nausea, paresthesia of the feet, polydipsia, polyuria, visual disturbances, vomiting and weight loss. Last A1C was: 07/11/2016: Hgb A1c MFr Bld 6.4   Patient is on Vitamin D supplement. 07/11/2016: Vit D, 25-Hydroxy 81  He is on thyroid medication. His medication was not changed last visit.   Lab Results  Component Value Date   TSH 1.85 07/11/2016   BMI is Body mass index is 28.86 kg/m., he is working on diet and exercise. Wt Readings from Last 3 Encounters:  11/10/16 192 lb 9.6 oz (87.4 kg)  07/11/16 189 lb (85.7 kg)  06/16/16 190 lb (86.2 kg)     Current Medications:  Current Outpatient Prescriptions on File Prior to Visit  Medication Sig Dispense Refill  . acyclovir (ZOVIRAX) 800 MG tablet Take 1 tablet (800 mg total) by mouth 3 (three) times daily as needed (fever blister). 90 tablet 0  . aspirin 325 MG tablet Take 325 mg by mouth at bedtime.     Marland Kitchen atenolol (TENORMIN) 50 MG tablet TAKE 1 TABLET (50 MG TOTAL) BY MOUTH DAILY. 90 tablet 0  . Cholecalciferol 5000 UNITS TABS Take 1 tablet by mouth daily.    Marland Kitchen ezetimibe (ZETIA) 10 MG tablet Take 1 tablet (10 mg total) by mouth daily. 90 tablet 1  . IRON CR PO Take 1 tablet by mouth daily.    Marland Kitchen levothyroxine (SYNTHROID, LEVOTHROID) 50 MCG tablet Take 1 tablet every morning on an empty stomach with only water for 30 minutes 90 tablet 1  . metFORMIN (GLUCOPHAGE) 500 MG tablet Take 2 tablets 2  x/ day for Diabetes 360 tablet 1  . Multiple Vitamin (MULTIVITAMIN) tablet Take 1 tablet by mouth at bedtime.     . Omega-3 Fatty Acids (FISH OIL) 1000 MG CAPS Take 1,000 mg by mouth at bedtime.     . valsartan (DIOVAN) 80 MG tablet TAKE 1/2 TO 1 TABLET BY MOUTH EVERY DAY FOR BLOOD PRESSURE AND KIDNEY PROTECTION 90 tablet 1   No current facility-administered medications on file prior to visit.    Medical History:  Past Medical History:  Diagnosis Date  . Anemia    low iron  .  Arthritis   . Cancer ([place])    skin cancer on face  . Cataracts, bilateral   . Coronary artery disease    CABG - 2008  . Diabetes mellitus without complication ([place])    type II  . GERD (gastroesophageal reflux disease)   . Hepatitis   . Hyperlipidemia   . Hypertension    pt states he does not have HTN, takes Atenolol for migraines  . Hypothyroidism   . Migraine headache with aura    takes Atenolol  . Sleep apnea    does not use cpap   Allergies:  Allergies  Allergen Reactions  . Ace Inhibitors Cough  . Lipitor [Atorvastatin] Other (See Comments)    Joint pain.     Review of Systems:  Review of Systems  Constitutional: Negative for chills, fever and malaise/fatigue.  HENT: Negative for congestion, ear pain and sore throat.   Eyes: Negative.   Respiratory: Negative for cough, shortness of breath and wheezing.   Cardiovascular: Negative for chest pain, palpitations and leg swelling.  Gastrointestinal: Negative for blood in stool, constipation, diarrhea, heartburn and melena.  Genitourinary: Negative.   Skin: Negative.   Neurological: Negative for dizziness, sensory change, loss of consciousness and headaches.  Psychiatric/Behavioral: Negative for depression. The patient is not nervous/anxious and does not have insomnia.     Family history- Review and unchanged  Social history- Review and unchanged  Physical Exam: BP 120/70   Pulse 80   Temp 97.5 F (36.4 C)   Resp 16   Ht 5' 8.5" (1.74 m)   Wt 192 lb 9.6 oz (87.4 kg)   SpO2 95%   BMI 28.86 kg/m  Wt Readings from Last 3 Encounters:  11/10/16 192 lb 9.6 oz (87.4 kg)  07/11/16 189 lb (85.7 kg)  06/16/16 190 lb (86.2 kg)   General Appearance: Well nourished well developed, non-toxic appearing, in no apparent distress. Eyes: PERRLA, EOMs, conjunctiva no swelling or erythema ENT/Mouth: Ear canals clear with no erythema, swelling, or discharge.  TMs normal bilaterally, oropharynx clear, moist, with no exudate.    Neck: Supple, thyroid normal, no JVD, no cervical adenopathy.  Respiratory: Respiratory effort normal, breath sounds clear A&P, no wheeze, rhonchi or rales noted.  No retractions, no accessory muscle usage Cardio: RRR with no MRGs. No noted edema.  Abdomen: Soft, + BS.  Non tender, no guarding, rebound, ventral hernia vs. Diastasis of abdomen, no masses. Musculoskeletal: Full ROM, 5/5 strength, Normal gait Skin: Several erythematous dry areas arms and face.  Warm, dry without rashes, lesions, ecchymosis.  Neuro: Awake and oriented X 3, Cranial nerves intact. No cerebellar symptoms.  Psych: normal affect, Insight and Judgment appropriate.    Vicie Mutters, PA-C 9:49 AM [facility]

## 2016-11-10 ENCOUNTER — Ambulatory Visit (INDEPENDENT_AMBULATORY_CARE_PROVIDER_SITE_OTHER): Payer: Medicare Other | Admitting: Physician Assistant

## 2016-11-10 ENCOUNTER — Encounter: Payer: Self-pay | Admitting: Physician Assistant

## 2016-11-10 VITALS — BP 120/70 | HR 80 | Temp 97.5°F | Resp 16 | Ht 68.5 in | Wt 192.6 lb

## 2016-11-10 DIAGNOSIS — I10 Essential (primary) hypertension: Secondary | ICD-10-CM | POA: Diagnosis not present

## 2016-11-10 DIAGNOSIS — N182 Chronic kidney disease, stage 2 (mild): Secondary | ICD-10-CM | POA: Diagnosis not present

## 2016-11-10 DIAGNOSIS — E782 Mixed hyperlipidemia: Secondary | ICD-10-CM | POA: Diagnosis not present

## 2016-11-10 DIAGNOSIS — E039 Hypothyroidism, unspecified: Secondary | ICD-10-CM | POA: Diagnosis not present

## 2016-11-10 DIAGNOSIS — E1122 Type 2 diabetes mellitus with diabetic chronic kidney disease: Secondary | ICD-10-CM | POA: Diagnosis not present

## 2016-11-10 DIAGNOSIS — I700 Atherosclerosis of aorta: Secondary | ICD-10-CM

## 2016-11-10 DIAGNOSIS — E559 Vitamin D deficiency, unspecified: Secondary | ICD-10-CM

## 2016-11-10 DIAGNOSIS — Z79899 Other long term (current) drug therapy: Secondary | ICD-10-CM

## 2016-11-10 LAB — BASIC METABOLIC PANEL WITH GFR
BUN: 19 mg/dL (ref 7–25)
CHLORIDE: 103 mmol/L (ref 98–110)
CO2: 26 mmol/L (ref 20–31)
Calcium: 10.1 mg/dL (ref 8.6–10.3)
Creat: 1.06 mg/dL (ref 0.70–1.18)
GFR, EST AFRICAN AMERICAN: 80 mL/min (ref >=60)
GFR, Est Non African American: 69 mL/min (ref >=60)
Glucose, Bld: 134 mg/dL — ABNORMAL HIGH (ref 65–99)
POTASSIUM: 4.7 mmol/L (ref 3.5–5.3)
SODIUM: 141 mmol/L (ref 135–146)

## 2016-11-10 LAB — HEPATIC FUNCTION PANEL
ALBUMIN: 4.5 g/dL (ref 3.6–5.1)
ALK PHOS: 45 U/L (ref 40–115)
ALT: 24 U/L (ref 9–46)
AST: 21 U/L (ref 10–35)
BILIRUBIN DIRECT: 0.1 mg/dL (ref <=0.2)
BILIRUBIN TOTAL: 0.5 mg/dL (ref 0.2–1.2)
Indirect Bilirubin: 0.4 mg/dL (ref 0.2–1.2)
Total Protein: 7.1 g/dL (ref 6.1–8.1)

## 2016-11-10 LAB — CBC WITH DIFFERENTIAL/PLATELET
Basophils Absolute: 0 cells/uL (ref 0–200)
Basophils Relative: 0 %
EOS PCT: 3 %
Eosinophils Absolute: 168 cells/uL (ref 15–500)
HCT: 42.7 % (ref 38.5–50.0)
Hemoglobin: 14 g/dL (ref 13.2–17.1)
LYMPHS PCT: 41 %
Lymphs Abs: 2296 cells/uL (ref 850–3900)
MCH: 33.3 pg — ABNORMAL HIGH (ref 27.0–33.0)
MCHC: 32.8 g/dL (ref 32.0–36.0)
MCV: 101.4 fL — AB (ref 80.0–100.0)
MONOS PCT: 8 %
MPV: 9.6 fL (ref 7.5–12.5)
Monocytes Absolute: 448 cells/uL (ref 200–950)
NEUTROS ABS: 2688 {cells}/uL (ref 1500–7800)
Neutrophils Relative %: 48 %
PLATELETS: 201 10*3/uL (ref 140–400)
RBC: 4.21 MIL/uL (ref 4.20–5.80)
RDW: 13.4 % (ref 11.0–15.0)
WBC: 5.6 10*3/uL (ref 3.8–10.8)

## 2016-11-10 LAB — LIPID PANEL
CHOL/HDL RATIO: 4.1 ratio (ref <5.0)
Cholesterol: 178 mg/dL (ref <200)
HDL: 43 mg/dL (ref >40)
LDL CALC: 109 mg/dL — AB (ref <100)
TRIGLYCERIDES: 129 mg/dL (ref <150)
VLDL: 26 mg/dL (ref <30)

## 2016-11-10 LAB — TSH: TSH: 1.79 m[IU]/L (ref 0.40–4.50)

## 2016-11-10 NOTE — Patient Instructions (Signed)
Fluorouracil, 5-FU skin cream or solution What is this medicine? FLUOROURACIL, 5-FU (flure oh YOOR a sil) is a chemotherapy agent. It is used on the skin to treat skin cancer and certain types of skin conditions that could become cancer. This medicine may be used for other purposes; ask your health care provider or pharmacist if you have questions. COMMON BRAND NAME(S): Carac, Efudex, Fluoroplex, Tolak What should I tell my health care provider before I take this medicine? They need to know if you have any of these conditions: -dihydropyrimidine dehydrogenase (DPD) deficiency -an unusual or allergic reaction to fluorouracil, other chemotherapy, other medicines, foods, dyes, or preservatives -pregnant or trying to get pregnant -breast-feeding How should I use this medicine? This medicine is only for use on the skin. Follow the directions on the prescription label. Wash hands before and after use. Wash affected area and gently pat dry. To apply this medicine use a cotton-tipped applicator, or use gloves if applying with fingertips. If applied with unprotected fingertips, it is very important to wash your hands well after you apply this medicine. Avoid applying to the eyes, nose, or mouth. Apply enough medicine to cover the affected area. You can cover the area with a light gauze dressing, but do not use tight or air-tight dressings. Finish the full course prescribed by your doctor or health care professional, even if you think your condition is better. Do not stop taking except on the advice of your doctor or health care professional. Talk to your pediatrician regarding the use of this medicine in children. Special care may be needed. Overdosage: If you think you have taken too much of this medicine contact a poison control center or emergency room at once. NOTE: This medicine is only for you. Do not share this medicine with others. What if I miss a dose? If you miss a dose, apply it as soon as you  can. If it is almost time for your next dose, only use that dose. Do not apply extra doses. Contact your doctor or health care professional if you miss more than one dose. What may interact with this medicine? Interactions are not expected. Do not use any other skin products without telling your doctor or health care professional. This list may not describe all possible interactions. Give your health care provider a list of all the medicines, herbs, non-prescription drugs, or dietary supplements you use. Also tell them if you smoke, drink alcohol, or use illegal drugs. Some items may interact with your medicine. What should I watch for while using this medicine? Visit your doctor or health care professional for checks on your progress. You will need to use this medicine for 2 to 6 weeks. This may be longer depending on the condition being treated. You may not see full healing for another 1 to 2 months after you stop using the medicine. Treated areas of skin can look unsightly during and for several weeks after treatment with this medicine. Do not get this medicine in your eyes. If you do, rinse out with plenty of cool tap water. This medicine can make you more sensitive to the sun. Keep out of the sun. If you cannot avoid being in the sun, wear protective clothing and use sunscreen. Do not use sun lamps or tanning beds/booths. If a pet comes in contact with the area where this medicine was applied to your skin or if it is ingested, they may have a serious risk of side effects. If accidental contact happens, the  this medicine was applied to your skin or if it is ingested, they may have a serious risk of side effects. If accidental contact happens, the skin of the pet should be washed right away with soap and water. Contact your vet right away if your pet becomes exposed.  Do not become pregnant while taking this medicine or for 1 month after stopping it. Women should inform their doctor if they wish to become pregnant or think they might be pregnant. There is a potential for serious  side effects to an unborn child. Talk to your health care professional or pharmacist for more information.  What side effects may I notice from receiving this medicine?  Side effects that you should report to your doctor or health care professional as soon as possible:  -allergic reactions like skin rash, itching or hives, swelling of the face, lips, or tongue  -bloody diarrhea  -fever or chills  -stomach pain  -vomiting  Side effects that usually do not require medical attention (report to your doctor or health care professional if they continue or are bothersome):  -redness or dry skin  -sensitivity to light  This list may not describe all possible side effects. Call your doctor for medical advice about side effects. You may report side effects to FDA at 1-800-FDA-1088.  Where should I keep my medicine?  Keep out of the reach of children and pets.  See product for storage instructions. Each product may have different instructions. Throw away any unused medicine after the expiration date.  NOTE: This sheet is a summary. It may not cover all possible information. If you have questions about this medicine, talk to your doctor, pharmacist, or health care provider.  © 2018 [employer]/Gold Standard (2015-07-31 19:12:02)

## 2016-11-11 LAB — MAGNESIUM: Magnesium: 1.9 mg/dL (ref 1.5–2.5)

## 2016-11-11 LAB — HEMOGLOBIN A1C
HEMOGLOBIN A1C: 6.8 % — AB (ref <5.7)
MEAN PLASMA GLUCOSE: 148 mg/dL

## 2016-11-23 DIAGNOSIS — H5703 Miosis: Secondary | ICD-10-CM | POA: Diagnosis not present

## 2016-11-23 DIAGNOSIS — H2511 Age-related nuclear cataract, right eye: Secondary | ICD-10-CM | POA: Diagnosis not present

## 2016-12-01 DIAGNOSIS — H2512 Age-related nuclear cataract, left eye: Secondary | ICD-10-CM | POA: Diagnosis not present

## 2016-12-07 DIAGNOSIS — H5703 Miosis: Secondary | ICD-10-CM | POA: Diagnosis not present

## 2016-12-07 DIAGNOSIS — H2512 Age-related nuclear cataract, left eye: Secondary | ICD-10-CM | POA: Diagnosis not present

## 2016-12-14 ENCOUNTER — Encounter: Payer: Self-pay | Admitting: Internal Medicine

## 2017-01-03 ENCOUNTER — Other Ambulatory Visit: Payer: Self-pay | Admitting: Internal Medicine

## 2017-01-03 DIAGNOSIS — I25810 Atherosclerosis of coronary artery bypass graft(s) without angina pectoris: Secondary | ICD-10-CM

## 2017-01-03 DIAGNOSIS — E782 Mixed hyperlipidemia: Secondary | ICD-10-CM

## 2017-01-16 ENCOUNTER — Other Ambulatory Visit: Payer: Self-pay | Admitting: Internal Medicine

## 2017-01-16 ENCOUNTER — Other Ambulatory Visit: Payer: Self-pay | Admitting: Physician Assistant

## 2017-01-31 ENCOUNTER — Ambulatory Visit (INDEPENDENT_AMBULATORY_CARE_PROVIDER_SITE_OTHER): Payer: Medicare Other | Admitting: Physician Assistant

## 2017-01-31 ENCOUNTER — Encounter: Payer: Self-pay | Admitting: Physician Assistant

## 2017-01-31 ENCOUNTER — Ambulatory Visit: Payer: Self-pay | Admitting: Internal Medicine

## 2017-01-31 VITALS — BP 122/64 | HR 69 | Temp 97.0°F | Resp 16 | Ht 68.5 in | Wt 190.6 lb

## 2017-01-31 DIAGNOSIS — R42 Dizziness and giddiness: Secondary | ICD-10-CM

## 2017-01-31 DIAGNOSIS — I25810 Atherosclerosis of coronary artery bypass graft(s) without angina pectoris: Secondary | ICD-10-CM

## 2017-01-31 DIAGNOSIS — L570 Actinic keratosis: Secondary | ICD-10-CM | POA: Diagnosis not present

## 2017-01-31 LAB — CBC WITH DIFFERENTIAL/PLATELET
Basophils Absolute: 0 cells/uL (ref 0–200)
Basophils Relative: 0 %
EOS ABS: 224 {cells}/uL (ref 15–500)
Eosinophils Relative: 4 %
HEMATOCRIT: 45.2 % (ref 38.5–50.0)
Hemoglobin: 15.2 g/dL (ref 13.2–17.1)
LYMPHS PCT: 43 %
Lymphs Abs: 2408 cells/uL (ref 850–3900)
MCH: 34.6 pg — AB (ref 27.0–33.0)
MCHC: 33.6 g/dL (ref 32.0–36.0)
MCV: 103 fL — AB (ref 80.0–100.0)
MONO ABS: 560 {cells}/uL (ref 200–950)
MPV: 9.2 fL (ref 7.5–12.5)
Monocytes Relative: 10 %
NEUTROS PCT: 43 %
Neutro Abs: 2408 cells/uL (ref 1500–7800)
Platelets: 210 10*3/uL (ref 140–400)
RBC: 4.39 MIL/uL (ref 4.20–5.80)
RDW: 13.4 % (ref 11.0–15.0)
WBC: 5.6 10*3/uL (ref 3.8–10.8)

## 2017-01-31 MED ORDER — MECLIZINE HCL 25 MG PO TABS: ORAL_TABLET | ORAL | 2 refills | Status: DC

## 2017-01-31 MED ORDER — LOSARTAN POTASSIUM 50 MG PO TABS: ORAL_TABLET | ORAL | 3 refills | Status: DC

## 2017-01-31 NOTE — Patient Instructions (Signed)
Your ears and sinuses are connected by the eustachian tube. When your sinuses are inflamed, this can close off the tube and cause fluid to collect in your middle ear. This can then cause dizziness, popping, clicking, ringing, and echoing in your ears. This is often NOT an infection and does NOT require antibiotics, it is caused by inflammation so the treatments help the inflammation. This can take a long time to get better so please be patient.  Here are things you can do to help with this: - Try the Flonase or Nasonex. Remember to spray each nostril twice towards the outer part of your eye.  Do not sniff but instead pinch your nose and tilt your head back to help the medicine get into your sinuses.  The best time to do this is at bedtime.Stop if you get blurred vision or nose bleeds.  -While drinking fluids, pinch and hold nose close and swallow, to help open eustachian tubes to drain fluid behind ear drums. -Please pick one of the over the counter allergy medications below and take it once daily for allergies.  It will also help with fluid behind ear drums. Claritin or loratadine cheapest but likely the weakest  Zyrtec or certizine at night because it can make you sleepy The strongest is allegra or fexafinadine  Cheapest at [employer], [employer], [employer] -can use decongestant over the counter, please do not use if you have high blood pressure or certain heart conditions.   if worsening HA, changes vision/speech, imbalance, weakness go to the ER    Benign Paroxysmal Positional Vertigo (BPPV)  General Information In Benign Paroxysmal Positional Vertigo (BPPV) dizziness is generally thought to be due to debris which has collected within a part of the inner ear. This debris can be thought of as "ear rocks", although the formal name is "otoconia". Ear rocks are small crystals of calcium carbonate derived from a structure in the ear called the "utricle" (figure to the right ). The symptoms of BPPV include  dizziness or vertigo, lightheadedness, imbalance, and nausea. Activities which bring on symptoms will vary among persons, but symptoms are usually followed by a change of position of the head like getting out of bed or rolling over in bed are common "problem" motions .  However if you have worsening HA, changes vision/speech, weakness go to the ER     What can be done? 1) Use two or more pillows at night. Avoid sleeping on the "bad" side. In the morning, get up slowly and sit on the edge of the bed for a minute. 2) Medication prescribed by your doctor.  3) The exercises below, you can do at home to help you prevent the sensation later in the day.  4) We can refer you to physical therapy at [facility], # [phone]  Home treatments: The Brandt-Daroff Exercises are a home method of treating BPPV,and are effective 95% of the time.  These exercises are performed in three sets per day for two weeks. In each set, one performs the maneuver as shown five times. Start sitting upright (position 1). Then move into the side-lying position (position 2), with the head angled upward about halfway. An easy way to remember this is to imagine someone standing about 6 feet in front of you, and just keep looking at their head at all times. Stay in the side-lying position for 30 seconds, or until the dizziness subsides if this is longer, then go back to the sitting position (position 3).   Stay there for 30 seconds, and then go to the opposite side (position 4) and follow the same routine.  At home Epley Maneuver This procedure seems to be even more effective than the in-office procedure, perhaps because it is repeated every night for a week.  The method (for the left side) is performed as shown on the figure below.  1) One stays in each of the supine (lying down) positions for 30 seconds, and in the sitting upright position (top) for 1 minute.  2) Thus, once cycle takes 2 1/2 minutes.   3) Typically 3 cycles are performed just prior to going to sleep.  4) It is best to do them at night rather than in the morning or midday, as if one becomes dizzy following the exercises, then it can resolve while one is sleeping.  The mirror image of this procedure is used for the right ear.  

## 2017-01-31 NOTE — Progress Notes (Signed)
Subjective:    Patient ID: Joshua Higgins, male    DOB: 12-19-1945, 71 y.o.   MRN: [id number]  HPI 71 y.o. WM with history of vertigo presents with dizziness.  States started on  Sat but has gradually gotten better. He states feels room is spinning, worse with turning head or moving  and has some pressure on right ear.  Had cataract surgery July 6th right eye and left eye on July 23rd.   He denies any associated neurological complications or symptoms, such as one-sided weakness, numbness, tingling, slurring of speech, droopy face, swallowing difficulties, diplopia, vision loss, hearing loss or tinnitus. No fever, chills, SOB, CP.   Also his valsartan was recalled, will send in replacement  CT scan head 2014 atrophy Normal stress test 2017  Blood pressure 122/64, pulse 69, temperature (!) 97 F (36.1 C), resp. rate 16, height 5' 8.5" (1.74 m), weight 190 lb 9.6 oz (86.5 kg).  Medications Current Outpatient Prescriptions on File Prior to Visit  Medication Sig  . acyclovir (ZOVIRAX) 800 MG tablet Take 1 tablet (800 mg total) by mouth 3 (three) times daily as needed (fever blister).  Marland Kitchen aspirin 325 MG tablet Take 325 mg by mouth at bedtime.   Marland Kitchen atenolol (TENORMIN) 50 MG tablet TAKE 1 TABLET BY MOUTH EVERY DAY  . Cholecalciferol 5000 UNITS TABS Take 1 tablet by mouth daily.  . IRON CR PO Take 1 tablet by mouth daily.  Marland Kitchen levothyroxine (SYNTHROID, LEVOTHROID) 50 MCG tablet TAKE 1 TABLET EVERY MORNING ON AN EMPTY STOMACH WITH ONLY WATER FOR 30 MINUTES  . metFORMIN (GLUCOPHAGE) 500 MG tablet Take 2 tablets 2 x/ day for Diabetes  . Multiple Vitamin (MULTIVITAMIN) tablet Take 1 tablet by mouth at bedtime.   . Omega-3 Fatty Acids (FISH OIL) 1000 MG CAPS Take 1,000 mg by mouth at bedtime.   . valsartan (DIOVAN) 80 MG tablet TAKE 1/2 TO 1 TABLET BY MOUTH EVERY DAY FOR BLOOD PRESSURE AND KIDNEY PROTECTION  . ZETIA 10 MG tablet TAKE 1 TABLET (10 MG TOTAL) BY MOUTH DAILY.   No current  facility-administered medications on file prior to visit.     Problem list He has [name] w/Stage 2 CKD (GFR 73 ml/min); Hyperlipidemia; Hypertension; GERD (gastroesophageal reflux disease); IBS (irritable bowel syndrome); Vitamin D deficiency; Medication management; Encounter for Medicare annual wellness exam; CAD s/p CABG; Hypothyroidism; and Atherosclerosis of abdominal aorta ([place]) on his problem list.   Review of Systems  Constitutional: Negative.  Negative for chills and fever.  HENT: Positive for ear pain and postnasal drip. Negative for congestion, hearing loss, rhinorrhea, sinus pain, sinus pressure, sore throat, tinnitus, trouble swallowing and voice change.   Respiratory: Negative.  Negative for shortness of breath.   Cardiovascular: Negative.  Negative for chest pain.  Gastrointestinal: Negative.  Negative for diarrhea.  Genitourinary: Negative.   Musculoskeletal: Negative.  Negative for neck pain.  Neurological: Positive for dizziness. Negative for tremors, seizures, syncope, facial asymmetry, speech difficulty, weakness, light-headedness, numbness and headaches.  Psychiatric/Behavioral: Negative.  Negative for hallucinations.       Objective:   Physical Exam  Constitutional: He appears well-developed and well-nourished.  HENT:  Right Ear: Hearing, external ear and ear canal normal. No mastoid tenderness. Tympanic membrane is not perforated and not erythematous. A middle ear effusion is present.  Left Ear: Hearing, external ear and ear canal normal. No mastoid tenderness. Tympanic membrane is not perforated and not erythematous. A middle ear effusion is present.  Eyes: Pupils  are equal, round, and reactive to light. Conjunctivae are normal.  Neck: Normal range of motion. Neck supple.  Cardiovascular: Normal rate and regular rhythm.   Pulmonary/Chest: Effort normal and breath sounds normal. He has no wheezes.  Lymphadenopathy:    He has no cervical adenopathy.  Skin:   Erythematous scaly areas on arms and head       Assessment & Plan:  Vertigo -nonsmoker, normal neuro, does have history of vertigo in the past, meclizine PRN, exercises given, flonase, allegy pill, continue bASA, if worsening HA, changes vision/speech, imbalance, weakness go to the ER  Actinic keratosis 3 freeze and thaw technique after verbal permission Tolerated well  HTN Will switch diovan to losartan due to recall Monitor BP at home  Future Appointments Date Time Provider [place]  02/14/2017 11:00 AM Unk Pinto, MD [facility] None  08/14/2017 2:00 PM Unk Pinto, MD [facility] None

## 2017-02-01 LAB — BASIC METABOLIC PANEL WITH GFR
BUN: 19 mg/dL (ref 7–25)
CALCIUM: 9.6 mg/dL (ref 8.6–10.3)
CO2: 24 mmol/L (ref 20–31)
Chloride: 101 mmol/L (ref 98–110)
Creat: 1.01 mg/dL (ref 0.70–1.18)
GFR, EST AFRICAN AMERICAN: 85 mL/min (ref >=60)
GFR, Est Non African American: 73 mL/min (ref >=60)
GLUCOSE: 117 mg/dL — AB (ref 65–99)
Potassium: 4.8 mmol/L (ref 3.5–5.3)
Sodium: 139 mmol/L (ref 135–146)

## 2017-02-01 LAB — HEPATIC FUNCTION PANEL
ALBUMIN: 4.6 g/dL (ref 3.6–5.1)
ALT: 33 U/L (ref 9–46)
AST: 26 U/L (ref 10–35)
Alkaline Phosphatase: 54 U/L (ref 40–115)
Bilirubin, Direct: 0.1 mg/dL (ref <=0.2)
Indirect Bilirubin: 0.5 mg/dL (ref 0.2–1.2)
TOTAL PROTEIN: 7.1 g/dL (ref 6.1–8.1)
Total Bilirubin: 0.6 mg/dL (ref 0.2–1.2)

## 2017-02-14 ENCOUNTER — Encounter: Payer: Self-pay | Admitting: Internal Medicine

## 2017-02-14 ENCOUNTER — Ambulatory Visit (INDEPENDENT_AMBULATORY_CARE_PROVIDER_SITE_OTHER): Payer: Medicare Other | Admitting: Internal Medicine

## 2017-02-14 VITALS — BP 128/74 | HR 76 | Temp 97.8°F | Resp 16 | Ht 68.5 in | Wt 191.4 lb

## 2017-02-14 DIAGNOSIS — N182 Chronic kidney disease, stage 2 (mild): Secondary | ICD-10-CM | POA: Diagnosis not present

## 2017-02-14 DIAGNOSIS — E782 Mixed hyperlipidemia: Secondary | ICD-10-CM

## 2017-02-14 DIAGNOSIS — E039 Hypothyroidism, unspecified: Secondary | ICD-10-CM

## 2017-02-14 DIAGNOSIS — Z79899 Other long term (current) drug therapy: Secondary | ICD-10-CM

## 2017-02-14 DIAGNOSIS — E559 Vitamin D deficiency, unspecified: Secondary | ICD-10-CM

## 2017-02-14 DIAGNOSIS — E1122 Type 2 diabetes mellitus with diabetic chronic kidney disease: Secondary | ICD-10-CM | POA: Diagnosis not present

## 2017-02-14 DIAGNOSIS — I10 Essential (primary) hypertension: Secondary | ICD-10-CM

## 2017-02-14 DIAGNOSIS — I25810 Atherosclerosis of coronary artery bypass graft(s) without angina pectoris: Secondary | ICD-10-CM | POA: Diagnosis not present

## 2017-02-14 LAB — TSH: TSH: 1.83 mIU/L (ref 0.40–4.50)

## 2017-02-14 MED ORDER — GEMFIBROZIL 600 MG PO TABS: ORAL_TABLET | ORAL | 1 refills | Status: DC

## 2017-02-14 NOTE — Progress Notes (Signed)
This very nice 71 y.o.  MWMpresents for 6 month follow up with Hypertension, Hyperlipidemia, Pre-Diabetes and Vitamin D Deficiency.      Patient is treated for HTN & BP has been controlled at home. Today's BP is at goal -128/74. Patient has ASCAD undergoing emergent PCA/Stenting in 2001 and then underwent CABG in 2008. Cardiolites were negative in 2013/2014 as was an ETT in 2017 by Dr Sallyanne Kuster. Patient has had no complaints of any cardiac type chest pain, palpitations, dyspnea/orthopnea/PND, dizziness, claudication, or dependent edema.     Hyperlipidemia is not controlled with diet & meds as patient is Statin intolerant having failed tx w/Lipitor and a subsequent low dose alternate day regimen on Crestor. Patient is still on Zetia and is agreeable to trial on Lopid.  Last Lipids were not at goal.   Lab Results  Component Value Date   CHOL 178 11/10/2016   HDL 43 11/10/2016   LDLCALC 109 (H) 11/10/2016   TRIG 129 11/10/2016   CHOLHDL 4.1 11/10/2016      Also, the patient has history of [name] (2000) with CKD 2 and has had no symptoms of reactive hypoglycemia, diabetic polys, paresthesias or visual blurring.  Last A1c was not at goal: Lab Results  Component Value Date   HGBA1C 6.8 (H) 11/10/2016      Patient has been treated for Hypothyroidism since 2013. Further, the patient also has history of Vitamin D Deficiency ("33" in 2008) and supplements vitamin D without any suspected side-effects. Last vitamin D was at goal:  Lab Results  Component Value Date   VD25OH 81 07/11/2016   Current Outpatient Prescriptions on File Prior to Visit  Medication Sig  . acyclovir (ZOVIRAX) 800 MG tablet Take 1 tablet (800 mg total) by mouth 3 (three) times daily as needed (fever blister).  Marland Kitchen aspirin 325 MG tablet Take 325 mg by mouth at bedtime.   Marland Kitchen atenolol (TENORMIN) 50 MG tablet TAKE 1 TABLET BY MOUTH EVERY DAY  . Cholecalciferol 5000 UNITS TABS Take 1 tablet by mouth daily.  . IRON CR PO Take 1  tablet by mouth daily.  Marland Kitchen levothyroxine (SYNTHROID, LEVOTHROID) 50 MCG tablet TAKE 1 TABLET EVERY MORNING ON AN EMPTY STOMACH WITH ONLY WATER FOR 30 MINUTES  . losartan (COZAAR) 50 MG tablet 1/2 to 1 tablet daily for BP to replace the diovan  . meclizine (ANTIVERT) 25 MG tablet 1/2-1 pill up to 3 times daily for motion sickness/dizziness  . metFORMIN (GLUCOPHAGE) 500 MG tablet Take 2 tablets 2 x/ day for Diabetes  . Multiple Vitamin (MULTIVITAMIN) tablet Take 1 tablet by mouth at bedtime.   . Omega-3 Fatty Acids (FISH OIL) 1000 MG CAPS Take 1,000 mg by mouth at bedtime.   Marland Kitchen ZETIA 10 MG tablet TAKE 1 TABLET (10 MG TOTAL) BY MOUTH DAILY.   No current facility-administered medications on file prior to visit.    Allergies  Allergen Reactions  . Ace Inhibitors Cough  . Lipitor [Atorvastatin] Other (See Comments)    Joint pain.   PMHx:   Past Medical History:  Diagnosis Date  . Anemia    low iron  . Arthritis   . Cancer ([place])    skin cancer on face  . Cataracts, bilateral   . Coronary artery disease    CABG - 2008  . Diabetes mellitus without complication ([place])    type II  . GERD (gastroesophageal reflux disease)   . Hepatitis   . Hyperlipidemia   .  Hypertension    pt states he does not have HTN, takes Atenolol for migraines  . Hypothyroidism   . Migraine headache with aura    takes Atenolol  . Sleep apnea    does not use cpap   Immunization History  Administered Date(s) Administered  . DT 08/29/2003, 06/04/2015  . Influenza-Unspecified 04/16/2014, 03/30/2016  . Pneumococcal Conjugate-13 12/26/2013  . Pneumococcal Polysaccharide-23 01/29/2010  . Zoster 05/11/2010  . Zoster Recombinat (Shingrix) 01/31/2017   Past Surgical History:  Procedure Laterality Date  . BACK SURGERY    . CARDIAC SURGERY     Bypass  . COLONOSCOPY    . CORONARY ANGIOPLASTY  2001  . CORONARY ARTERY BYPASS GRAFT    . ELBOW SURGERY Left    due to damaged nerve  . KNEE SURGERY Right     arthroscopic  . LUMBAR LAMINECTOMY/DECOMPRESSION MICRODISCECTOMY Right 08/04/2014   Procedure: Right Lumbar four-five Microdiskectomy;  Surgeon: Kristeen Miss, MD;  Location: [facility];  Service: Neurosurgery;  Laterality: Right;  Right L4-5 Microdiskectomy  . SURGERY SCROTAL / TESTICULAR     as a child/teenager   FHx:    Reviewed / unchanged  SHx:    Reviewed / unchanged  Systems Review:  Constitutional: Denies fever, chills, wt changes, headaches, insomnia, fatigue, night sweats, change in appetite. Eyes: Denies redness, blurred vision, diplopia, discharge, itchy, watery eyes.  ENT: Denies discharge, congestion, post nasal drip, epistaxis, sore throat, earache, hearing loss, dental pain, tinnitus, vertigo, sinus pain, snoring.  CV: Denies chest pain, palpitations, irregular heartbeat, syncope, dyspnea, diaphoresis, orthopnea, PND, claudication or edema. Respiratory: denies cough, dyspnea, DOE, pleurisy, hoarseness, laryngitis, wheezing.  Gastrointestinal: Denies dysphagia, odynophagia, heartburn, reflux, water brash, abdominal pain or cramps, nausea, vomiting, bloating, diarrhea, constipation, hematemesis, melena, hematochezia  or hemorrhoids. Genitourinary: Denies dysuria, frequency, urgency, nocturia, hesitancy, discharge, hematuria or flank pain. Musculoskeletal: Denies arthralgias, myalgias, stiffness, jt. swelling, pain, limping or strain/sprain.  Skin: Denies pruritus, rash, hives, warts, acne, eczema or change in skin lesion(s). Neuro: No weakness, tremor, incoordination, spasms, paresthesia or pain. Psychiatric: Denies confusion, memory loss or sensory loss. Endo: Denies change in weight, skin or hair change.  Heme/Lymph: No excessive bleeding, bruising or enlarged lymph nodes.  Physical Exam  BP 128/74   Pulse 76   Temp 97.8 F (36.6 C)   Resp 16   Ht 5' 8.5" (1.74 m)   Wt 191 lb 6.4 oz (86.8 kg)   BMI 28.68 kg/m   Appears well nourished, well groomed  and in no  distress.  Eyes: PERRLA, EOMs, conjunctiva no swelling or erythema. Sinuses: No frontal/maxillary tenderness ENT/Mouth: EAC's clear, TM's nl w/o erythema, bulging. Nares clear w/o erythema, swelling, exudates. Oropharynx clear without erythema or exudates. Oral hygiene is good. Tongue normal, non obstructing. Hearing intact.  Neck: Supple. Thyroid nl. Car 2+/2+ without bruits, nodes or JVD. Chest: Median Sternotomy scar. Respirations nl with BS clear & equal w/o rales, rhonchi, wheezing or stridor.  Cor: Heart sounds normal w/ regular rate and rhythm without sig. murmurs, gallops, clicks or rubs. Peripheral pulses normal and equal  without edema.  Abdomen: Soft & bowel sounds normal. Non-tender w/o guarding, rebound, hernias, masses or organomegaly.  Lymphatics: Unremarkable.  Musculoskeletal: Full ROM all peripheral extremities, joint stability, 5/5 strength and normal gait.  Skin: Warm, dry without exposed rashes, lesions or ecchymosis apparent.  Neuro: Cranial nerves intact, reflexes equal bilaterally. Sensory-motor testing grossly intact. Tendon reflexes grossly intact.  Pysch: Alert & oriented x 3.  Insight and  judgement nl & appropriate. No ideations.  Assessment and Plan:  1. Essential hypertension  - Continue medication, monitor blood pressure at home.  - Continue DASH diet. Reminder to go to the ER if any CP,  SOB, nausea, dizziness, severe HA, changes vision/speech.  - CBC with Differential/Platelet - BASIC METABOLIC PANEL WITH GFR - Magnesium - TSH  2. Hyperlipidemia, mixed  - Continue diet/meds, exercise,& lifestyle modifications.  - Continue monitor periodic cholesterol/liver & renal functions   - Hepatic function panel - Lipid panel - TSH  3. [name] w/CKD 2  ([place])  - Continue diet, exercise, lifestyle modifications.  - Monitor appropriate labs.  - Hemoglobin A1c - Insulin, random  4. Vitamin D deficiency  - Continue supplementation.  - VITAMIN D 25  Hydroxy   5. Coronary artery disease  - Lipid panel - VITAMIN D 25 Hydroxy   6. Hypothyroidism  - TSH  7. Medication management  - CBC with Differential/Platelet - BASIC METABOLIC PANEL WITH GFR - Magnesium - Hepatic function panel - Lipid panel - TSH    - Hemoglobin A1c - Insulin, random - VITAMIN D 25 Hydroxy        Discussed  regular exercise, BP monitoring, weight control to achieve/maintain BMI less than 25 and discussed med and SE's. Recommended labs to assess and monitor clinical status with further disposition pending results of labs. Over 30 minutes of exam, counseling, chart review was performed.

## 2017-02-14 NOTE — Patient Instructions (Signed)

## 2017-02-15 LAB — BASIC METABOLIC PANEL WITH GFR
BUN: 14 mg/dL (ref 7–25)
CALCIUM: 9.5 mg/dL (ref 8.6–10.3)
CO2: 24 mmol/L (ref 20–32)
CREATININE: 0.95 mg/dL (ref 0.70–1.18)
Chloride: 103 mmol/L (ref 98–110)
GFR, EST NON AFRICAN AMERICAN: 79 mL/min (ref >=60)
GLUCOSE: 115 mg/dL — AB (ref 65–99)
Potassium: 4.6 mmol/L (ref 3.5–5.3)
Sodium: 141 mmol/L (ref 135–146)

## 2017-02-15 LAB — LIPID PANEL
CHOL/HDL RATIO: 4.2 ratio (ref <5.0)
CHOLESTEROL: 181 mg/dL (ref <200)
HDL: 43 mg/dL (ref >40)
LDL Cholesterol: 113 mg/dL — ABNORMAL HIGH (ref <100)
TRIGLYCERIDES: 126 mg/dL (ref <150)
VLDL: 25 mg/dL (ref <30)

## 2017-02-15 LAB — HEMOGLOBIN A1C
HEMOGLOBIN A1C: 6.9 % — AB (ref <5.7)
MEAN PLASMA GLUCOSE: 151 mg/dL

## 2017-02-15 LAB — HEPATIC FUNCTION PANEL
ALBUMIN: 4.3 g/dL (ref 3.6–5.1)
ALT: 31 U/L (ref 9–46)
AST: 27 U/L (ref 10–35)
Alkaline Phosphatase: 53 U/L (ref 40–115)
BILIRUBIN INDIRECT: 0.5 mg/dL (ref 0.2–1.2)
Bilirubin, Direct: 0.1 mg/dL (ref <=0.2)
TOTAL PROTEIN: 6.8 g/dL (ref 6.1–8.1)
Total Bilirubin: 0.6 mg/dL (ref 0.2–1.2)

## 2017-02-15 LAB — INSULIN, RANDOM: Insulin: 10.9 u[IU]/mL (ref 2.0–19.6)

## 2017-02-15 LAB — VITAMIN D 25 HYDROXY (VIT D DEFICIENCY, FRACTURES): VIT D 25 HYDROXY: 77 ng/mL (ref 30–100)

## 2017-02-15 LAB — MAGNESIUM: MAGNESIUM: 2 mg/dL (ref 1.5–2.5)

## 2017-03-18 ENCOUNTER — Other Ambulatory Visit: Payer: Self-pay | Admitting: Internal Medicine

## 2017-04-02 DIAGNOSIS — Z23 Encounter for immunization: Secondary | ICD-10-CM | POA: Diagnosis not present

## 2017-04-26 DIAGNOSIS — H04123 Dry eye syndrome of bilateral lacrimal glands: Secondary | ICD-10-CM | POA: Diagnosis not present

## 2017-04-26 DIAGNOSIS — E1129 Type 2 diabetes mellitus with other diabetic kidney complication: Secondary | ICD-10-CM | POA: Insufficient documentation

## 2017-04-26 DIAGNOSIS — E119 Type 2 diabetes mellitus without complications: Secondary | ICD-10-CM | POA: Diagnosis not present

## 2017-04-26 DIAGNOSIS — I10 Essential (primary) hypertension: Secondary | ICD-10-CM | POA: Insufficient documentation

## 2017-04-26 DIAGNOSIS — Z7984 Long term (current) use of oral hypoglycemic drugs: Secondary | ICD-10-CM | POA: Diagnosis not present

## 2017-04-26 DIAGNOSIS — Z961 Presence of intraocular lens: Secondary | ICD-10-CM | POA: Diagnosis not present

## 2017-05-11 ENCOUNTER — Other Ambulatory Visit: Payer: Self-pay | Admitting: *Deleted

## 2017-05-11 MED ORDER — GEMFIBROZIL 600 MG PO TABS: ORAL_TABLET | ORAL | 1 refills | Status: DC

## 2017-05-23 ENCOUNTER — Ambulatory Visit: Payer: Self-pay | Admitting: Physician Assistant

## 2017-06-13 ENCOUNTER — Other Ambulatory Visit: Payer: Self-pay | Admitting: *Deleted

## 2017-06-13 ENCOUNTER — Other Ambulatory Visit: Payer: Self-pay | Admitting: Internal Medicine

## 2017-06-13 MED ORDER — METFORMIN HCL 500 MG PO TABS: ORAL_TABLET | ORAL | 1 refills | Status: DC

## 2017-06-13 MED ORDER — METFORMIN HCL ER 500 MG PO TB24: ORAL_TABLET | ORAL | 1 refills | Status: DC

## 2017-06-18 ENCOUNTER — Encounter: Payer: Self-pay | Admitting: Adult Health

## 2017-06-18 NOTE — Progress Notes (Signed)
MEDICARE ANNUAL WELLNESS VISIT AND FOLLOW UP Assessment:   Diagnoses and all orders for this visit:  Encounter for Medicare annual wellness exam  Essential hypertension Well controlled; continue medication Monitor blood pressure at home; call if consistently over 130/80 Continue DASH diet.   Reminder to go to the ER if any CP, SOB, nausea, dizziness, severe HA, changes vision/speech, left arm numbness and tingling and jaw pain.  Coronary artery disease involving coronary bypass graft of native heart, Other angina ([place]) very infrequent CCS class II exertional angina pectoris; continue BB; followed by cardiology (Dr. Sallyanne Kuster) Continue ASA, close management of cholesterol   Atherosclerosis of abdominal aorta ([place]) Control blood pressure, cholesterol, glucose, increase exercise.   Gastroesophageal reflux disease, esophagitis presence not specified Well managed with PRN OTC medications Discussed diet, avoiding triggers and other lifestyle changes  Irritable bowel syndrome, unspecified type Avoid trigger foods; currently stable with lifestyle modification only  Type 2 diabetes mellitus with stage 2 chronic kidney disease, without long-term current use of insulin (HCC) Continue metformin Continue diet and exercise.  Perform daily foot/skin check, notify office of any concerning changes.  -     Hemoglobin A1c  Hypothyroidism, unspecified type Continue synthroid reminded to take on an empty stomach 30-57mins before food.  -     TSH  Mixed hyperlipidemia Intolerant of statins; treated by zetia, gemfibrozil, omega 3 Continue low cholesterol diet and exercise.  -     Lipid panel  Vitamin D deficiency Continue supplementation At goal at last visit; defer checking level  Medication management -     CBC with Differential/Platelet -     BASIC METABOLIC PANEL WITH GFR -     Hepatic function panel  Arthralgia, unspecified joint -     Rocky mtn spotted fvr abs pnl(IgG+IgM) -      B. burgdorfi antibodies -     Ehrlichia antibody panel  Tick bite of multiple sites -     Rocky mtn spotted fvr abs pnl(IgG+IgM) -     B. burgdorfi antibodies -     Ehrlichia antibody panel   Over 30 minutes of exam, counseling, chart review, and critical decision making was performed  Future Appointments  Date Time Provider [place]  08/29/2017 11:00 AM Unk Pinto, MD [facility] None     Plan:   During the course of the visit the patient was educated and counseled about appropriate screening and preventive services including:    Pneumococcal vaccine   Influenza vaccine  Prevnar 13  Td vaccine  Screening electrocardiogram  Colorectal cancer screening  Diabetes screening  Glaucoma screening  Nutrition counseling    Subjective:  Joshua Higgins is a 71 y.o. male who presents for Medicare Annual Wellness Visit and 3 month follow up for HTN, hyperlipidemia, prediabetes, hypothyroid and vitamin D Def. Patient has ASCAD with stenting in 2001, CABG 2008 with very infrequent CCS class II exertional angina pectoris and followed by Dr. Sallyanne Kuster. He is s/p bilateral cataract excisions by Dr. Gershon Crane this year. He reports multiple tick bites this past year for which he was not evaluated or treated - he reports problems with ongoing joint aches/mild fatigue.   His blood pressure has been controlled at home, today their BP is BP: 126/66 He does workout. He denies chest pain, shortness of breath, dizziness.   He is on cholesterol medication and denies myalgias (he stopped taking gemfibrozil due to muscle aches, does not tolerate statins). His cholesterol is not at goal. The cholesterol last visit  was:   Lab Results  Component Value Date   CHOL 181 02/14/2017   HDL 43 02/14/2017   LDLCALC 113 (H) 02/14/2017   TRIG 126 02/14/2017   CHOLHDL 4.2 02/14/2017   He has been working on diet and exercise for T2 diabetes, and denies increased appetite, nausea,  polydipsia, polyuria, visual disturbances, vomiting and weight loss. Last A1C in the office was:  Lab Results  Component Value Date   HGBA1C 6.9 (H) 02/14/2017   Last GFR Lab Results  Component Value Date   GFRNONAA 79 02/14/2017   He is on thyroid medication. His medication was not changed last visit.   Lab Results  Component Value Date   TSH 1.83 02/14/2017    Patient is on Vitamin D supplement and at goal at last check:   Lab Results  Component Value Date   VD25OH 77 02/14/2017      Medication Review: Current Outpatient Medications on File Prior to Visit  Medication Sig Dispense Refill  . acyclovir (ZOVIRAX) 800 MG tablet Take 1 tablet (800 mg total) by mouth 3 (three) times daily as needed (fever blister). 90 tablet 0  . aspirin 325 MG tablet Take 325 mg by mouth at bedtime.     Marland Kitchen atenolol (TENORMIN) 50 MG tablet TAKE 1 TABLET BY MOUTH EVERY DAY 90 tablet 1  . Cholecalciferol 5000 UNITS TABS Take 1 tablet by mouth daily.    . IRON CR PO Take 1 tablet by mouth daily.    Marland Kitchen levothyroxine (SYNTHROID, LEVOTHROID) 50 MCG tablet TAKE 1 TABLET EVERY MORNING ON AN EMPTY STOMACH WITH ONLY WATER FOR 30 MINUTES 90 tablet 1  . losartan (COZAAR) 50 MG tablet 1/2 to 1 tablet daily for BP to replace the diovan 30 tablet 3  . meclizine (ANTIVERT) 25 MG tablet 1/2-1 pill up to 3 times daily for motion sickness/dizziness 30 tablet 2  . metFORMIN (GLUCOPHAGE XR) 500 MG 24 hr tablet Take 2 tablets 2 x / day with food for Diabetes 360 tablet 1  . Multiple Vitamin (MULTIVITAMIN) tablet Take 1 tablet by mouth at bedtime.     . Omega-3 Fatty Acids (FISH OIL) 1000 MG CAPS Take 1,000 mg by mouth at bedtime.      No current facility-administered medications on file prior to visit.     Allergies: Allergies  Allergen Reactions  . Gemfibrozil Other (See Comments)    Muscle aches  . Ace Inhibitors Cough  . Lipitor [Atorvastatin] Other (See Comments)    Joint pain.    Current Problems  (verified) has [name] w/Stage 2 CKD (GFR 73 ml/min); Hyperlipidemia; Hypertension; GERD (gastroesophageal reflux disease); IBS (irritable bowel syndrome); Vitamin D deficiency; Medication management; CAD s/p CABG; Hypothyroidism; and Atherosclerosis of abdominal aorta ([place]) on their problem list.  Screening Tests Immunization History  Administered Date(s) Administered  . DT 08/29/2003, 06/04/2015  . Influenza-Unspecified 04/16/2014, 03/30/2016, 04/02/2017  . Pneumococcal Conjugate-13 12/26/2013  . Pneumococcal Polysaccharide-23 01/29/2010  . Zoster 05/11/2010  . Zoster Recombinat (Shingrix) 01/31/2017   Preventative care: Last colonoscopy: 2005 Cologuard: 2018  Prior vaccinations: TD or Tdap: 2016  Influenza: 2018 Pneumococcal: 2011 Prevnar13: 2015 Shingles/Zostavax: 2011, [id number]/312018 shingrix #1 -waiting on #2 with next shipment  Names of Other Physician/Practitioners you currently use: 1. [facility] here for primary care 2. Dr. Gershon Crane, eye doctor, last visit  - diabetic eye exam 10/2016 3. Dr. Isac Caddy, dentist, last visit  2018  Patient Care Team: Unk Pinto, MD as PCP -  General (Internal Medicine) Rutherford Guys, MD as Consulting Physician (Ophthalmology) Kristeen Miss, MD as Consulting Physician (Neurosurgery) Sanda Klein, MD as Consulting Physician (Cardiology) Teena Irani, MD as Consulting Physician (Gastroenterology)  Surgical: He  has a past surgical history that includes Cardiac surgery; Knee surgery (Right); Back surgery; Coronary angioplasty (2001); Coronary artery bypass graft; Surgery scrotal / testicular; Elbow surgery (Left); Colonoscopy; Lumbar laminectomy/decompression microdiscectomy (Right, 08/04/2014); and Cataract extraction, bilateral (Bilateral, 2018). Family His family history includes Arthritis in his mother; CAD in his brother and sister; Diabetes in his maternal grandmother; Heart attack in his brother and  father; Hypertension in his brother; Ulcers in his mother. Social history  He reports that he quit smoking about 43 years ago. he has never used smokeless tobacco. He reports that he does not drink alcohol or use drugs.  MEDICARE WELLNESS OBJECTIVES: Physical activity: Current Exercise Habits: Home exercise routine, Type of exercise: walking;Other - see comments(yardwork ), Time (Minutes): 45, Frequency (Times/Week): 3, Weekly Exercise (Minutes/Week): 135, Intensity: Mild, Exercise limited by: None identified Cardiac risk factors: Cardiac Risk Factors include: advanced age (>25men, >46 women);diabetes mellitus;dyslipidemia;hypertension;male gender;smoking/ tobacco exposure Depression/mood screen:   Depression screen [facility] 2/9 06/20/2017  Decreased Interest 0  Down, Depressed, Hopeless 0  PHQ - 2 Score 0    ADLs:  In your present state of health, do you have any difficulty performing the following activities: 06/20/2017 02/14/2017  Hearing? N N  Vision? N N  Difficulty concentrating or making decisions? N N  Walking or climbing stairs? N N  Dressing or bathing? N N  Doing errands, shopping? N N  Some recent data might be hidden     Cognitive Testing  Alert? Yes  Normal Appearance?Yes  Oriented to person? Yes  Place? Yes   Time? Yes  Recall of three objects?  Yes  Can perform simple calculations? Yes  Displays appropriate judgment?Yes  Can read the correct time from a watch face?Yes  EOL planning: Does Patient Have a Medical Advance Directive?: Yes Type of Advance Directive: Healthcare Power of Attorney, Living will Does patient want to make changes to medical advance directive?: No - Patient declined Copy of [place] in Chart?: No - copy requested   Objective:   Today's Vitals   06/20/17 1041  BP: 126/66  Pulse: 66  Temp: (!) 97.5 F (36.4 C)  SpO2: 96%  Weight: 190 lb 12.8 oz (86.5 kg)  Height: 5' 8.5" (1.74 m)   Body mass index is 28.59  kg/m.  General appearance: alert, no distress, WD/WN, male HEENT: normocephalic, sclerae anicteric, TMs pearly, nares patent, no discharge or erythema, pharynx normal Oral cavity: MMM, no lesions Neck: supple, no lymphadenopathy, no thyromegaly, no masses Heart: RRR, normal S1, S2, no murmurs Lungs: CTA bilaterally, no wheezes, rhonchi, or rales Abdomen: +bs, soft, non tender, non distended, no masses, no hepatomegaly, no splenomegaly Musculoskeletal: nontender, no swelling, no obvious deformity Extremities: no edema, no cyanosis, no clubbing Pulses: 2+ symmetric, upper 1+ diminished bilateral lower extremities, normal cap refill Neurological: alert, oriented x 3, CN2-12 intact, strength normal upper extremities and lower extremities, sensation normal throughout, DTRs 2+ throughout, no cerebellar signs, gait normal Psychiatric: normal affect, behavior normal, pleasant  PHQ-2: 0  Medicare Attestation I have personally reviewed: The patient's medical and social history Their use of alcohol, tobacco or illicit drugs Their current medications and supplements The patient's functional ability including ADLs,fall risks, home safety risks, cognitive, and hearing and visual impairment Diet and physical activities Evidence  for depression or mood disorders  The patient's weight, height, BMI, and visual acuity have been recorded in the chart.  I have made referrals, counseling, and provided education to the patient based on review of the above and I have provided the patient with a written personalized care plan for preventive services.     Izora Ribas, NP   06/20/2017

## 2017-06-20 ENCOUNTER — Ambulatory Visit (INDEPENDENT_AMBULATORY_CARE_PROVIDER_SITE_OTHER): Payer: Medicare Other | Admitting: Adult Health

## 2017-06-20 ENCOUNTER — Encounter: Payer: Self-pay | Admitting: Adult Health

## 2017-06-20 VITALS — BP 126/66 | HR 66 | Temp 97.5°F | Ht 68.5 in | Wt 190.8 lb

## 2017-06-20 DIAGNOSIS — K589 Irritable bowel syndrome without diarrhea: Secondary | ICD-10-CM | POA: Diagnosis not present

## 2017-06-20 DIAGNOSIS — I10 Essential (primary) hypertension: Secondary | ICD-10-CM | POA: Diagnosis not present

## 2017-06-20 DIAGNOSIS — Z961 Presence of intraocular lens: Secondary | ICD-10-CM | POA: Diagnosis not present

## 2017-06-20 DIAGNOSIS — M2550 Pain in unspecified joint: Secondary | ICD-10-CM

## 2017-06-20 DIAGNOSIS — N182 Chronic kidney disease, stage 2 (mild): Secondary | ICD-10-CM

## 2017-06-20 DIAGNOSIS — K219 Gastro-esophageal reflux disease without esophagitis: Secondary | ICD-10-CM | POA: Diagnosis not present

## 2017-06-20 DIAGNOSIS — Z79899 Other long term (current) drug therapy: Secondary | ICD-10-CM | POA: Diagnosis not present

## 2017-06-20 DIAGNOSIS — W57XXXA Bitten or stung by nonvenomous insect and other nonvenomous arthropods, initial encounter: Secondary | ICD-10-CM | POA: Diagnosis not present

## 2017-06-20 DIAGNOSIS — Z0000 Encounter for general adult medical examination without abnormal findings: Secondary | ICD-10-CM

## 2017-06-20 DIAGNOSIS — E039 Hypothyroidism, unspecified: Secondary | ICD-10-CM | POA: Diagnosis not present

## 2017-06-20 DIAGNOSIS — E782 Mixed hyperlipidemia: Secondary | ICD-10-CM

## 2017-06-20 DIAGNOSIS — E1122 Type 2 diabetes mellitus with diabetic chronic kidney disease: Secondary | ICD-10-CM

## 2017-06-20 DIAGNOSIS — I25708 Atherosclerosis of coronary artery bypass graft(s), unspecified, with other forms of angina pectoris: Secondary | ICD-10-CM

## 2017-06-20 DIAGNOSIS — I700 Atherosclerosis of aorta: Secondary | ICD-10-CM

## 2017-06-20 DIAGNOSIS — R6889 Other general symptoms and signs: Secondary | ICD-10-CM

## 2017-06-20 DIAGNOSIS — E559 Vitamin D deficiency, unspecified: Secondary | ICD-10-CM | POA: Diagnosis not present

## 2017-06-20 DIAGNOSIS — Z0001 Encounter for general adult medical examination with abnormal findings: Secondary | ICD-10-CM

## 2017-06-20 MED ORDER — EZETIMIBE 10 MG PO TABS: 10.0000 mg | ORAL_TABLET | Freq: Every day | ORAL | 2 refills | Status: DC

## 2017-06-20 NOTE — Patient Instructions (Addendum)
Add citrucel/benefiber supplement daily, follow very low cholesterol diet - high in vegetables, whole grains, beans - low in dairy/meat fats  Use olive oil spray only - no deep fried foods  Limit alcohol intake - can raise cholesterol   Fat and Cholesterol Restricted Diet Getting too much fat and cholesterol in your diet may cause health problems. Following this diet helps keep your fat and cholesterol at normal levels. This can keep you from getting sick. What types of fat should I choose?  Choose monosaturated and polyunsaturated fats. These are found in foods such as olive oil, canola oil, flaxseeds, walnuts, almonds, and seeds.  Eat more omega-3 fats. Good choices include salmon, mackerel, sardines, tuna, flaxseed oil, and ground flaxseeds.  Limit saturated fats. These are in animal products such as meats, butter, and cream. They can also be in plant products such as palm oil, palm kernel oil, and coconut oil.  Avoid foods with partially hydrogenated oils in them. These contain trans fats. Examples of foods that have trans fats are stick margarine, some tub margarines, cookies, crackers, and other baked goods. What general guidelines do I need to follow?  Check food labels. Look for the words "trans fat" and "saturated fat."  When preparing a meal: ? Fill half of your plate with vegetables and green salads. ? Fill one fourth of your plate with whole grains. Look for the word "whole" as the first word in the ingredient list. ? Fill one fourth of your plate with lean protein foods.  Eat more foods that have fiber, like apples, carrots, beans, peas, and barley.  Eat more home-cooked foods. Eat less at restaurants and buffets.  Limit or avoid alcohol.  Limit foods high in starch and sugar.  Limit fried foods.  Cook foods without frying them. Baking, boiling, grilling, and broiling are all great options.  Lose weight if you are overweight. Losing even a small amount of weight  can help your overall health. It can also help prevent diseases such as diabetes and heart disease. What foods can I eat? Grains Whole grains, such as whole wheat or whole grain breads, crackers, cereals, and pasta. Unsweetened oatmeal, bulgur, barley, quinoa, or brown rice. Corn or whole wheat flour tortillas. Vegetables Fresh or frozen vegetables (raw, steamed, roasted, or grilled). Green salads. Fruits All fresh, canned (in natural juice), or frozen fruits. Meat and Other Protein Products Ground beef (85% or leaner), grass-fed beef, or beef trimmed of fat. Skinless chicken or [place]. Ground chicken or [place]. Pork trimmed of fat. All fish and seafood. Eggs. Dried beans, peas, or lentils. Unsalted nuts or seeds. Unsalted canned or dry beans. Dairy Low-fat dairy products, such as skim or 1% milk, 2% or reduced-fat cheeses, low-fat ricotta or cottage cheese, or plain low-fat yogurt. Fats and Oils Tub margarines without trans fats. Light or reduced-fat mayonnaise and salad dressings. Avocado. Olive, canola, sesame, or safflower oils. Natural peanut or almond butter (choose ones without added sugar and oil). The items listed above may not be a complete list of recommended foods or beverages. Contact your dietitian for more options. What foods are not recommended? Grains White bread. White pasta. White rice. Cornbread. Bagels, pastries, and croissants. Crackers that contain trans fat. Vegetables White potatoes. Corn. Creamed or fried vegetables. Vegetables in a cheese sauce. Fruits Dried fruits. Canned fruit in light or heavy syrup. Fruit juice. Meat and Other Protein Products Fatty cuts of meat. Ribs, chicken wings, bacon, sausage, bologna, salami, chitterlings, fatback, hot dogs, bratwurst, and  packaged luncheon meats. Liver and organ meats. Dairy Whole or 2% milk, cream, half-and-half, and cream cheese. Whole milk cheeses. Whole-fat or sweetened yogurt. Full-fat cheeses. Nondairy creamers  and whipped toppings. Processed cheese, cheese spreads, or cheese curds. Sweets and Desserts Corn syrup, sugars, honey, and molasses. Candy. Jam and jelly. Syrup. Sweetened cereals. Cookies, pies, cakes, donuts, muffins, and ice cream. Fats and Oils Butter, stick margarine, lard, shortening, ghee, or bacon fat. Coconut, palm kernel, or palm oils. Beverages Alcohol. Sweetened drinks (such as sodas, lemonade, and fruit drinks or punches). The items listed above may not be a complete list of foods and beverages to avoid. Contact your dietitian for more information. This information is not intended to replace advice given to you by your health care provider. Make sure you discuss any questions you have with your health care provider. Document Released: 12/20/2011 Document Revised: 02/25/2016 Document Reviewed: 09/19/2013 Elsevier Interactive Patient Education  [employer].

## 2017-06-24 LAB — HEPATIC FUNCTION PANEL
AG Ratio: 1.8 (calc) (ref 1.0–2.5)
ALKALINE PHOSPHATASE (APISO): 52 U/L (ref 40–115)
ALT: 34 U/L (ref 9–46)
AST: 28 U/L (ref 10–35)
Albumin: 4.4 g/dL (ref 3.6–5.1)
BILIRUBIN INDIRECT: 0.5 mg/dL (ref 0.2–1.2)
Bilirubin, Direct: 0.2 mg/dL (ref 0.0–0.2)
Globulin: 2.5 g/dL (calc) (ref 1.9–3.7)
Total Bilirubin: 0.7 mg/dL (ref 0.2–1.2)
Total Protein: 6.9 g/dL (ref 6.1–8.1)

## 2017-06-24 LAB — CBC WITH DIFFERENTIAL/PLATELET
BASOS PCT: 0.6 %
Basophils Absolute: 29 cells/uL (ref 0–200)
EOS ABS: 181 {cells}/uL (ref 15–500)
Eosinophils Relative: 3.7 %
HCT: 41.4 % (ref 38.5–50.0)
Hemoglobin: 14 g/dL (ref 13.2–17.1)
Lymphs Abs: 2225 cells/uL (ref 850–3900)
MCH: 33.7 pg — AB (ref 27.0–33.0)
MCHC: 33.8 g/dL (ref 32.0–36.0)
MCV: 99.8 fL (ref 80.0–100.0)
MONOS PCT: 9.5 %
MPV: 9.8 fL (ref 7.5–12.5)
Neutro Abs: 1999 cells/uL (ref 1500–7800)
Neutrophils Relative %: 40.8 %
PLATELETS: 180 10*3/uL (ref 140–400)
RBC: 4.15 10*6/uL — ABNORMAL LOW (ref 4.20–5.80)
RDW: 12.4 % (ref 11.0–15.0)
TOTAL LYMPHOCYTE: 45.4 %
WBC mixed population: 466 cells/uL (ref 200–950)
WBC: 4.9 10*3/uL (ref 3.8–10.8)

## 2017-06-24 LAB — EHRLICHIA ANTIBODY PANEL
E. CHAFFEENSIS AB IGG: <1:64 {titer}
E. CHAFFEENSIS AB IGM: <1:20 {titer}

## 2017-06-24 LAB — ROCKY MTN SPOTTED FVR ABS PNL(IGG+IGM)
RMSF IgG: NOT DETECTED
RMSF IgM: NOT DETECTED

## 2017-06-24 LAB — BASIC METABOLIC PANEL WITH GFR
BUN: 15 mg/dL (ref 7–25)
CHLORIDE: 102 mmol/L (ref 98–110)
CO2: 30 mmol/L (ref 20–32)
Calcium: 9.7 mg/dL (ref 8.6–10.3)
Creat: 0.94 mg/dL (ref 0.70–1.18)
GFR, Est African American: 93 mL/min/{1.73_m2} (ref >=60)
GFR, Est Non African American: 80 mL/min/{1.73_m2} (ref >=60)
GLUCOSE: 118 mg/dL — AB (ref 65–99)
Potassium: 4.5 mmol/L (ref 3.5–5.3)
SODIUM: 140 mmol/L (ref 135–146)

## 2017-06-24 LAB — LIPID PANEL
CHOLESTEROL: 176 mg/dL (ref <200)
HDL: 47 mg/dL (ref >40)
LDL Cholesterol (Calc): 106 mg/dL (calc) — ABNORMAL HIGH
Non-HDL Cholesterol (Calc): 129 mg/dL (calc) (ref <130)
Total CHOL/HDL Ratio: 3.7 (calc) (ref <5.0)
Triglycerides: 135 mg/dL (ref <150)

## 2017-06-24 LAB — HEMOGLOBIN A1C
EAG (MMOL/L): 8.1 (calc)
HEMOGLOBIN A1C: 6.7 %{Hb} — AB (ref <5.7)
Mean Plasma Glucose: 146 (calc)

## 2017-06-24 LAB — TSH: TSH: 2.55 mIU/L (ref 0.40–4.50)

## 2017-06-24 LAB — B. BURGDORFI ANTIBODIES

## 2017-07-13 ENCOUNTER — Other Ambulatory Visit: Payer: Self-pay | Admitting: Internal Medicine

## 2017-08-14 ENCOUNTER — Encounter: Payer: Self-pay | Admitting: Internal Medicine

## 2017-08-29 ENCOUNTER — Encounter: Payer: Self-pay | Admitting: Internal Medicine

## 2017-09-26 DIAGNOSIS — H01024 Squamous blepharitis left upper eyelid: Secondary | ICD-10-CM | POA: Diagnosis not present

## 2017-09-26 DIAGNOSIS — H01021 Squamous blepharitis right upper eyelid: Secondary | ICD-10-CM | POA: Diagnosis not present

## 2017-09-26 DIAGNOSIS — H01025 Squamous blepharitis left lower eyelid: Secondary | ICD-10-CM | POA: Diagnosis not present

## 2017-09-26 DIAGNOSIS — H0012 Chalazion right lower eyelid: Secondary | ICD-10-CM | POA: Diagnosis not present

## 2017-09-26 DIAGNOSIS — H01022 Squamous blepharitis right lower eyelid: Secondary | ICD-10-CM | POA: Diagnosis not present

## 2017-09-26 DIAGNOSIS — Z961 Presence of intraocular lens: Secondary | ICD-10-CM | POA: Diagnosis not present

## 2017-09-28 ENCOUNTER — Other Ambulatory Visit: Payer: Self-pay | Admitting: Physician Assistant

## 2017-10-09 ENCOUNTER — Encounter: Payer: Self-pay | Admitting: Cardiovascular Disease

## 2017-10-09 ENCOUNTER — Ambulatory Visit (INDEPENDENT_AMBULATORY_CARE_PROVIDER_SITE_OTHER): Payer: Medicare Other | Admitting: Cardiovascular Disease

## 2017-10-09 ENCOUNTER — Encounter: Payer: Self-pay | Admitting: Internal Medicine

## 2017-10-09 VITALS — BP 136/74 | HR 82 | Ht 68.5 in | Wt 190.0 lb

## 2017-10-09 DIAGNOSIS — Z789 Other specified health status: Secondary | ICD-10-CM | POA: Diagnosis not present

## 2017-10-09 DIAGNOSIS — E119 Type 2 diabetes mellitus without complications: Secondary | ICD-10-CM | POA: Diagnosis not present

## 2017-10-09 DIAGNOSIS — I10 Essential (primary) hypertension: Secondary | ICD-10-CM | POA: Diagnosis not present

## 2017-10-09 DIAGNOSIS — E785 Hyperlipidemia, unspecified: Secondary | ICD-10-CM

## 2017-10-09 DIAGNOSIS — I25810 Atherosclerosis of coronary artery bypass graft(s) without angina pectoris: Secondary | ICD-10-CM | POA: Diagnosis not present

## 2017-10-09 NOTE — Patient Instructions (Signed)
Medication Instructions: Dr Sallyanne Kuster recommends that you continue on your current medications as directed. Please refer to the Current Medication list given to you today.  Labwork: Your physician recommends that you return for lab work at your earliest [place].  Testing/Procedures: 1. Exercise Tolerance Test - Your physician has requested that you have an exercise tolerance test. For further information please visit [email or web address]. Please also follow instruction sheet, as given.  Follow-up: Dr Sallyanne Kuster recommends that you schedule a follow-up appointment in 12 months. You will receive a reminder letter in the mail two months in advance. If you don't receive a letter, please call our office to schedule the follow-up appointment.  If you need a refill on your cardiac medications before your next appointment, please call your pharmacy.

## 2017-10-09 NOTE — Progress Notes (Signed)
Patient ID: Joshua Higgins, male   DOB: January 01, 1946, 72 y.o.   MRN: [id number]    Cardiology Office Note    Date:  10/11/2017   ID:  Joshua Higgins, DOB 10-03-1945, MRN [id number]  PCP:  Unk Pinto, MD  Cardiologist:   Sanda Klein, MD   Chief Complaint  Patient presents with  . Coronary Artery Disease    History of Present Illness:  Joshua Higgins is a 72 y.o. male who is now roughly 9 years after coronary bypass surgery (LIMA to LAD, SVG to ramus intermedius and OM, SVG to acute marginal branch of RCA), and also has a history of hyperlipidemia, hypertension, type 2 diabetes mellitus controlled with oral antidiabetics, erectile dysfunction, migraines.  He was diagnosed based on an abnormal screening stress test and has never had convincing angina pectoris.  The patient specifically denies any chest pain at rest exertion, dyspnea at rest or with exertion, orthopnea, paroxysmal nocturnal dyspnea, syncope, palpitations, focal neurological deficits, intermittent claudication, lower extremity edema, unexplained weight gain, cough, hemoptysis or wheezing.  He was unable to tolerate even twice weekly rosuvastatin and has failed all other statins in the past, due to disabling back and limb myalgia.   Past Medical History:  Diagnosis Date  . Anemia    low iron  . Arthritis   . Cancer ([facility])    skin cancer on face  . Cataracts, bilateral   . Coronary artery disease    CABG - 2008  . Diabetes mellitus without complication ([facility] [facility])    type II  . GERD (gastroesophageal reflux disease)   . Hepatitis   . Hyperlipidemia   . Hypertension    pt states he does not have HTN, takes Atenolol for migraines  . Hypothyroidism   . Migraine headache with aura    takes Atenolol  . Sleep apnea    does not use cpap    Past Surgical History:  Procedure Laterality Date  . BACK SURGERY    . CARDIAC SURGERY     Bypass  . CATARACT EXTRACTION, BILATERAL Bilateral 2018   Dr. Gershon Crane  .  COLONOSCOPY    . CORONARY ANGIOPLASTY  2001  . CORONARY ARTERY BYPASS GRAFT    . ELBOW SURGERY Left    due to damaged nerve  . KNEE SURGERY Right    arthroscopic  . LUMBAR LAMINECTOMY/DECOMPRESSION MICRODISCECTOMY Right 08/04/2014   Procedure: Right Lumbar four-five Microdiskectomy;  Surgeon: Kristeen Miss, MD;  Location: [facility];  Service: Neurosurgery;  Laterality: Right;  Right L4-5 Microdiskectomy  . SURGERY SCROTAL / TESTICULAR     as a child/teenager    Outpatient Medications Prior to Visit  Medication Sig Dispense Refill  . acyclovir (ZOVIRAX) 800 MG tablet Take 1 tablet (800 mg total) by mouth 3 (three) times daily as needed (fever blister). 90 tablet 0  . aspirin 325 MG tablet Take 325 mg by mouth at bedtime.     Marland Kitchen atenolol (TENORMIN) 50 MG tablet TAKE 1 TABLET BY MOUTH EVERY DAY 90 tablet 1  . Cholecalciferol 5000 UNITS TABS Take 1 tablet by mouth daily.    Marland Kitchen ezetimibe (ZETIA) 10 MG tablet Take 1 tablet (10 mg total) by mouth daily. 90 tablet 2  . IRON CR PO Take 1 tablet by mouth daily.    Marland Kitchen levothyroxine (SYNTHROID, LEVOTHROID) 50 MCG tablet TAKE 1 TABLET EVERY MORNING ON AN EMPTY STOMACH WITH ONLY WATER FOR 30 MINUTES 90 tablet 1  . losartan (COZAAR) 50 MG tablet  TAKE 1/2 TO 1 TABLET BY MOUTH EVERY DAY FOR BLOOD PRESSURE 30 tablet 3  . meclizine (ANTIVERT) 25 MG tablet 1/2-1 pill up to 3 times daily for motion sickness/dizziness 30 tablet 2  . metFORMIN (GLUCOPHAGE XR) 500 MG 24 hr tablet Take 2 tablets 2 x / day with food for Diabetes 360 tablet 1  . Multiple Vitamin (MULTIVITAMIN) tablet Take 1 tablet by mouth at bedtime.     . Omega-3 Fatty Acids (FISH OIL) 1000 MG CAPS Take 1,000 mg by mouth at bedtime.      No facility-administered medications prior to visit.      Allergies:   Gemfibrozil; Ace inhibitors; and Lipitor [atorvastatin]   Social History   Socioeconomic History  . Marital status: Married    Spouse name: Not on file  . Number of children: Not on  file  . Years of education: Not on file  . Highest education level: Not on file  Occupational History  . Not on file  Social Needs  . Financial resource strain: Not on file  . Food insecurity:    Worry: Not on file    Inability: Not on file  . Transportation needs:    Medical: Not on file    Non-medical: Not on file  Tobacco Use  . Smoking status: Former Smoker    Last attempt to quit: 10/01/1973    Years since quitting: 44.0  . Smokeless tobacco: Never Used  Substance and Sexual Activity  . Alcohol use: No  . Drug use: No  . Sexual activity: Not on file  Lifestyle  . Physical activity:    Days per week: Not on file    Minutes per session: Not on file  . Stress: Not on file  Relationships  . Social connections:    Talks on phone: Not on file    Gets together: Not on file    Attends religious service: Not on file    Active member of club or organization: Not on file    Attends meetings of clubs or organizations: Not on file    Relationship status: Not on file  Other Topics Concern  . Not on file  Social History Narrative  . Not on file     Family History:  The patient's family history includes Arthritis in his mother; CAD in his brother and sister; Diabetes in his maternal grandmother; Heart attack in his brother and father; Hypertension in his brother; Ulcers in his mother.   ROS:   Please see the history of present illness.    ROS All other systems reviewed and are negative.   PHYSICAL EXAM:   VS:  BP 136/74   Pulse 82   Ht 5' 8.5" (1.74 m)   Wt 190 lb (86.2 kg)   BMI 28.47 kg/m     General: Alert, oriented x3, no distress, appears fit for his age Head: no evidence of trauma, PERRL, EOMI, no exophtalmos or lid lag, no myxedema, no xanthelasma; normal ears, nose and oropharynx Neck: normal jugular venous pulsations and no hepatojugular reflux; brisk carotid pulses without delay and no carotid bruits Chest: clear to auscultation, no signs of consolidation by  percussion or palpation, normal fremitus, symmetrical and full respiratory excursions Cardiovascular: normal position and quality of the apical impulse, regular rhythm, normal first and second heart sounds, no murmurs, rubs or gallops Abdomen: no tenderness or distention, no masses by palpation, no abnormal pulsatility or arterial bruits, normal bowel sounds, no hepatosplenomegaly Extremities: no clubbing, cyanosis  or edema; 2+ radial, ulnar and brachial pulses bilaterally; 2+ right femoral, posterior tibial and dorsalis pedis pulses; 2+ left femoral, posterior tibial and dorsalis pedis pulses; no subclavian or femoral bruits Neurological: grossly nonfocal Psych: Normal mood and affect   Wt Readings from Last 3 Encounters:  10/09/17 190 lb (86.2 kg)  06/20/17 190 lb 12.8 oz (86.5 kg)  02/14/17 191 lb 6.4 oz (86.8 kg)      Studies/Labs Reviewed:   EKG:  EKG is ordered today.  It shows NSR and minor (chronic) nonspecific ST-T changes  Recent Labs: 02/14/2017: Magnesium 2.0 06/20/2017: ALT 34; BUN 15; Creat 0.94; Hemoglobin 14.0; Platelets 180; Potassium 4.5; Sodium 140; TSH 2.55   Lipid Panel    Component Value Date/Time   CHOL 176 06/20/2017 1122   TRIG 135 06/20/2017 1122   HDL 47 06/20/2017 1122   CHOLHDL 3.7 06/20/2017 1122   VLDL 25 02/14/2017 1214   LDLCALC 106 (H) 06/20/2017 1122      ASSESSMENT:    1. Coronary artery disease involving coronary bypass graft of native heart without angina pectoris   2. Essential hypertension   3. Diabetes mellitus type 2 in nonobese (H[facility])   4. Dyslipidemia   5. Statin intolerance      PLAN:  In order of problems listed above:  1. CAD s/p CABG: Essentially asymptomatic, will need to perform periodic stress testing since his symptoms have not been reliable for disease detection. 2. HTN: good control 3. DM: good control, A1c 6.7%. 4. HLP: Could not tolerate multiple statins, even with only twice weekly doseage and CoQ10  supplements: needs Repatha or Praluent    Medication Adjustments/Labs and Tests Ordered: Current medicines are reviewed at length with the patient today.  Concerns regarding medicines are outlined above.  Medication changes, Labs and Tests ordered today are listed in the Patient Instructions below. Patient Instructions  Medication Instructions: Dr Sallyanne Kuster recommends that you continue on your current medications as directed. Please refer to the Current Medication list given to you today.  Labwork: Your physician recommends that you return for lab work at your earliest [facility].  Testing/Procedures: 1. Exercise Tolerance Test - Your physician has requested that you have an exercise tolerance test. For further information please visit [email or web address]. Please also follow instruction sheet, as given.  Follow-up: Dr Sallyanne Kuster recommends that you schedule a follow-up appointment in 12 months. You will receive a reminder letter in the mail two months in advance. If you don't receive a letter, please call our office to schedule the follow-up appointment.  If you need a refill on your cardiac medications before your next appointment, please call your pharmacy.      Signed, Sanda Klein, MD  10/11/2017 5:10 PM    [facility] [place], [place], [place]  [address] Phone: [phone]; Fax: [phone]

## 2017-10-11 ENCOUNTER — Encounter: Payer: Self-pay | Admitting: Cardiovascular Disease

## 2017-10-17 ENCOUNTER — Telehealth (HOSPITAL_COMMUNITY): Payer: Self-pay

## 2017-10-17 NOTE — Telephone Encounter (Signed)
Encounter complete. 

## 2017-10-18 ENCOUNTER — Ambulatory Visit (INDEPENDENT_AMBULATORY_CARE_PROVIDER_SITE_OTHER): Payer: Medicare Other | Admitting: Internal Medicine

## 2017-10-18 ENCOUNTER — Encounter: Payer: Self-pay | Admitting: Internal Medicine

## 2017-10-18 VITALS — BP 118/68 | HR 72 | Temp 97.6°F | Resp 16 | Ht 69.0 in | Wt 188.4 lb

## 2017-10-18 DIAGNOSIS — Z87891 Personal history of nicotine dependence: Secondary | ICD-10-CM

## 2017-10-18 DIAGNOSIS — E782 Mixed hyperlipidemia: Secondary | ICD-10-CM | POA: Diagnosis not present

## 2017-10-18 DIAGNOSIS — N138 Other obstructive and reflux uropathy: Secondary | ICD-10-CM | POA: Diagnosis not present

## 2017-10-18 DIAGNOSIS — E039 Hypothyroidism, unspecified: Secondary | ICD-10-CM

## 2017-10-18 DIAGNOSIS — Z1211 Encounter for screening for malignant neoplasm of colon: Secondary | ICD-10-CM

## 2017-10-18 DIAGNOSIS — I10 Essential (primary) hypertension: Secondary | ICD-10-CM

## 2017-10-18 DIAGNOSIS — Z79899 Other long term (current) drug therapy: Secondary | ICD-10-CM

## 2017-10-18 DIAGNOSIS — I700 Atherosclerosis of aorta: Secondary | ICD-10-CM

## 2017-10-18 DIAGNOSIS — Z136 Encounter for screening for cardiovascular disorders: Secondary | ICD-10-CM

## 2017-10-18 DIAGNOSIS — I25810 Atherosclerosis of coronary artery bypass graft(s) without angina pectoris: Secondary | ICD-10-CM | POA: Diagnosis not present

## 2017-10-18 DIAGNOSIS — Z8249 Family history of ischemic heart disease and other diseases of the circulatory system: Secondary | ICD-10-CM

## 2017-10-18 DIAGNOSIS — K219 Gastro-esophageal reflux disease without esophagitis: Secondary | ICD-10-CM | POA: Diagnosis not present

## 2017-10-18 DIAGNOSIS — N182 Chronic kidney disease, stage 2 (mild): Secondary | ICD-10-CM

## 2017-10-18 DIAGNOSIS — E559 Vitamin D deficiency, unspecified: Secondary | ICD-10-CM

## 2017-10-18 DIAGNOSIS — E1122 Type 2 diabetes mellitus with diabetic chronic kidney disease: Secondary | ICD-10-CM | POA: Diagnosis not present

## 2017-10-18 DIAGNOSIS — Z1212 Encounter for screening for malignant neoplasm of rectum: Secondary | ICD-10-CM

## 2017-10-18 DIAGNOSIS — I25708 Atherosclerosis of coronary artery bypass graft(s), unspecified, with other forms of angina pectoris: Secondary | ICD-10-CM | POA: Diagnosis not present

## 2017-10-18 DIAGNOSIS — N401 Enlarged prostate with lower urinary tract symptoms: Secondary | ICD-10-CM

## 2017-10-18 NOTE — Patient Instructions (Addendum)
We Do NOT Approve of  [facility] [place] Soliciting Our Patients  To Do Home Visits  & We Do NOT Approve of [employer] > > > > > > > > > > > > > > > > > > > > > > > > > > > > > > > > > > >  > > > >   Preventive Care for Adults  A healthy lifestyle and preventive care can promote health and wellness. Preventive health guidelines for men include the following key practices:  A routine yearly physical is a good way to check with your health care provider about your health and preventative screening. It is a chance to share any concerns and updates on your health and to receive a thorough exam.  Visit your dentist for a routine exam and preventative care every 6 months. Brush your teeth twice a day and floss once a day. Good oral hygiene prevents tooth decay and gum disease.  The frequency of eye exams is based on your age, health, family medical history, use of contact lenses, and other factors. Follow your health care provider's recommendations for frequency of eye exams.  Eat a healthy diet. Foods such as vegetables, fruits, whole grains, low-fat dairy products, and lean protein foods contain the nutrients you need without too many calories. Decrease your intake of foods high in solid fats, added sugars, and salt. Eat the right amount of calories for you. Get information about a proper diet from your health care provider, if necessary.  Regular physical exercise is one of the most important things you can do for your health. Most adults should get at least 150 minutes of moderate-intensity exercise (any activity that increases your heart rate and causes you to sweat) each week. In addition, most adults need muscle-strengthening exercises on 2 or more days a week.  Maintain a healthy weight. The body mass index (BMI) is a screening tool to identify possible weight problems. It provides an estimate of body fat based on height and weight. Your health care provider can find  your BMI and can help you achieve or maintain a healthy weight. For adults 20 years and older:  A BMI below 18.5 is considered underweight.  A BMI of 18.5 to 24.9 is normal.  A BMI of 25 to 29.9 is considered overweight.  A BMI of 30 and above is considered obese.  Maintain normal blood lipids and cholesterol levels by exercising and minimizing your intake of saturated fat. Eat a balanced diet with plenty of fruit and vegetables. Blood tests for lipids and cholesterol should begin at age 20 and be repeated every 5 years. If your lipid or cholesterol levels are high, you are over 50, or you are at high risk for heart disease, you may need your cholesterol levels checked more frequently. Ongoing high lipid and cholesterol levels should be treated with medicines if diet and exercise are not working.  If you smoke, find out from your health care provider how to quit. If you do not use tobacco, do not start.  Lung cancer screening is recommended for adults aged 55-80 years who are at high risk for developing lung cancer because of a history of smoking. A yearly low-dose CT scan of the lungs is recommended for people who have at least a 30-pack-year history of smoking and are a current smoker or have quit within the past 15 years. A pack year of smoking is smoking an average of 1 pack   of cigarettes a day for 1 year (for example: 1 pack a day for 30 years or 2 packs a day for 15 years). Yearly screening should continue until the smoker has stopped smoking for at least 15 years. Yearly screening should be stopped for people who develop a health problem that would prevent them from having lung cancer treatment.  If you choose to drink alcohol, do not have more than 2 drinks per day. One drink is considered to be 12 ounces (355 mL) of beer, 5 ounces (148 mL) of wine, or 1.5 ounces (44 mL) of liquor.  Avoid use of street drugs. Do not share needles with anyone. Ask for help if you need support or instructions  about stopping the use of drugs.  High blood pressure causes heart disease and increases the risk of stroke. Your blood pressure should be checked at least every 1-2 years. Ongoing high blood pressure should be treated with medicines, if weight loss and exercise are not effective.  If you are 45-79 years old, ask your health care provider if you should take aspirin to prevent heart disease.  Diabetes screening involves taking a blood sample to check your fasting blood sugar level. Testing should be considered at a younger age or be carried out more frequently if you are overweight and have at least 1 risk factor for diabetes.  Colorectal cancer can be detected and often prevented. Most routine colorectal cancer screening begins at the age of 50 and continues through age 75. However, your health care provider may recommend screening at an earlier age if you have risk factors for colon cancer. On a yearly basis, your health care provider may provide home test kits to check for hidden blood in the stool. Use of a small camera at the end of a tube to directly examine the colon (sigmoidoscopy or colonoscopy) can detect the earliest forms of colorectal cancer. Talk to your health care provider about this at age 50, when routine screening begins. Direct exam of the colon should be repeated every 5-10 years through age 75, unless early forms of precancerous polyps or small growths are found.  Hepatitis C blood testing is recommended for all people born from 1945 through 1965 and any individual with known risks for hepatitis C.  Screening for abdominal aortic aneurysm (AAA)  by ultrasound is recommended for people who have history of high blood pressure or who are current or former smokers.  Healthy men should  receive prostate-specific antigen (PSA) blood tests as part of routine cancer screening. Talk with your health care provider about prostate cancer screening.  Testicular cancer screening is   recommended for adult males. Screening includes self-exam, a health care provider exam, and other screening tests. Consult with your health care provider about any symptoms you have or any concerns you have about testicular cancer.  Use sunscreen. Apply sunscreen liberally and repeatedly throughout the day. You should seek shade when your shadow is shorter than you. Protect yourself by wearing long sleeves, pants, a wide-brimmed hat, and sunglasses year round, whenever you are outdoors.  Once a month, do a whole-body skin exam, using a mirror to look at the skin on your back. Tell your health care provider about new moles, moles that have irregular borders, moles that are larger than a pencil eraser, or moles that have changed in shape or color.  Stay current with required vaccines (immunizations).  Influenza vaccine. All adults should be immunized every year.  Tetanus, diphtheria, and acellular pertussis (  Td, Tdap) vaccine. An adult who has not previously received Tdap or who does not know his vaccine status should receive 1 dose of Tdap. This initial dose should be followed by tetanus and diphtheria toxoids (Td) booster doses every 10 years. Adults with an unknown or incomplete history of completing a 3-dose immunization series with Td-containing vaccines should begin or complete a primary immunization series including a Tdap dose. Adults should receive a Td booster every 10 years.  Zoster vaccine. One dose is recommended for adults aged 60 years or older unless certain conditions are present.    PREVNAR - Pneumococcal 13-valent conjugate (PCV13) vaccine. When indicated, a person who is uncertain of his immunization history and has no record of immunization should receive the PCV13 vaccine. An adult aged 19 years or older who has certain medical conditions and has not been previously immunized should receive 1 dose of PCV13 vaccine. This PCV13 should be followed with a dose of pneumococcal  polysaccharide (PPSV23) vaccine. The PPSV23 vaccine dose should be obtained 1 or more year(s)after the dose of PCV13 vaccine. An adult aged 19 years or older who has certain medical conditions and previously received 1 or more doses of PPSV23 vaccine should receive 1 dose of PCV13. The PCV13 vaccine dose should be obtained 1 or more years after the last PPSV23 vaccine dose.    PNEUMOVAX - Pneumococcal polysaccharide (PPSV23) vaccine. When PCV13 is also indicated, PCV13 should be obtained first. All adults aged 65 years and older should be immunized. An adult younger than age 65 years who has certain medical conditions should be immunized. Any person who resides in a nursing home or long-term care facility should be immunized. An adult smoker should be immunized. People with an immunocompromised condition and certain other conditions should receive both PCV13 and PPSV23 vaccines. People with human immunodeficiency virus (HIV) infection should be immunized as soon as possible after diagnosis. Immunization during chemotherapy or radiation therapy should be avoided. Routine use of PPSV23 vaccine is not recommended for American Indians, [place] Natives, or people younger than 65 years unless there are medical conditions that require PPSV23 vaccine. When indicated, people who have unknown immunization and have no record of immunization should receive PPSV23 vaccine. One-time revaccination 5 years after the first dose of PPSV23 is recommended for people aged 19-64 years who have chronic kidney failure, nephrotic syndrome, asplenia, or immunocompromised conditions. People who received 1-2 doses of PPSV23 before age 65 years should receive another dose of PPSV23 vaccine at age 65 years or later if at least 5 years have passed since the previous dose. Doses of PPSV23 are not needed for people immunized with PPSV23 at or after age 65 years.    Hepatitis A vaccine. Adults who wish to be protected from this disease, have  certain high-risk conditions, work with hepatitis A-infected animals, work in hepatitis A research labs, or travel to or work in countries with a high rate of hepatitis A should be immunized. Adults who were previously unvaccinated and who anticipate close contact with an international adoptee during the first 60 days after arrival in the [place] from a country with a high rate of hepatitis A should be immunized.    Hepatitis B vaccine. Adults should be immunized if they wish to be protected from this disease, have certain high-risk conditions, may be exposed to blood or other infectious body fluids, are household contacts or sex partners of hepatitis B positive people, are clients or workers in certain care facilities, or   travel to or work in countries with a high rate of hepatitis B.   Preventive Service / Frequency   Ages 65 and over  Blood pressure check.  Lipid and cholesterol check.  Lung cancer screening. / Every year if you are aged 55-80 years and have a 30-pack-year history of smoking and currently smoke or have quit within the past 15 years. Yearly screening is stopped once you have quit smoking for at least 15 years or develop a health problem that would prevent you from having lung cancer treatment.  Fecal occult blood test (FOBT) of stool. You may not have to do this test if you get a colonoscopy every 10 years.  Flexible sigmoidoscopy** or colonoscopy.** / Every 5 years for a flexible sigmoidoscopy or every 10 years for a colonoscopy beginning at age 50 and continuing until age 75.  Hepatitis C blood test.** / For all people born from 1945 through 1965 and any individual with known risks for hepatitis C.  Abdominal aortic aneurysm (AAA) screening./ Screening current or former smokers or have Hypertension.  Skin self-exam. / Monthly.  Influenza vaccine. / Every year.  Tetanus, diphtheria, and acellular pertussis (Tdap/Td) vaccine.** / 1 dose of Td every 10  years.   Zoster vaccine.** / 1 dose for adults aged 60 years or older.         Pneumococcal 13-valent conjugate (PCV13) vaccine.    Pneumococcal polysaccharide (PPSV23) vaccine.     Hepatitis A vaccine.** / Consult your health care provider.  Hepatitis B vaccine.** / Consult your health care provider. Screening for abdominal aortic aneurysm (AAA)  by ultrasound is recommended for people who have history of high blood pressure or who are current or former smokers. ++++++++++ Recommend Adult Low Dose Aspirin or  coated  Aspirin 81 mg daily  To reduce risk of Colon Cancer 20 %,  Skin Cancer 26 % ,  Malignant Melanoma 46%  and  Pancreatic cancer 60% ++++++++++++++++++++++ Vitamin D goal  is between 70-100.  Please make sure that you are taking your Vitamin D as directed.  It is very important as a natural anti-inflammatory  helping hair, skin, and nails, as well as reducing stroke and heart attack risk.  It helps your bones and helps with mood. It also decreases numerous cancer risks so please take it as directed.  Low Vit D is associated with a 200-300% higher risk for CANCER  and 200-300% higher risk for HEART   ATTACK  &  STROKE.   ...................................... It is also associated with higher death rate at younger ages,  autoimmune diseases like Rheumatoid arthritis, Lupus, Multiple Sclerosis.    Also many other serious conditions, like depression, Alzheimer's Dementia, infertility, muscle aches, fatigue, fibromyalgia - just to name a few. ++++++++++++++++++++++ Recommend the book "The END of DIETING" by Dr Joel Fuhrman  & the book "The END of DIABETES " by Dr Joel Fuhrman At [employer] - get book & Audio CD's    Being diabetic has a  300% increased risk for heart attack, stroke, cancer, and alzheimer- type vascular dementia. It is very important that you work harder with diet by avoiding all foods that are white. Avoid white rice (brown & wild rice is OK), white  potatoes (sweetpotatoes in moderation is OK), White bread or wheat bread or anything made out of white flour like bagels, donuts, rolls, buns, biscuits, cakes, pastries, cookies, pizza crust, and pasta (made from white flour & egg whites) - vegetarian pasta or spinach or wheat   pasta is OK. Multigrain breads like Arnold's or [employer], or multigrain sandwich thins or flatbreads.  Diet, exercise and weight loss can reverse and cure diabetes in the early stages.  Diet, exercise and weight loss is very important in the control and prevention of complications of diabetes which affects every system in your body, ie. Brain - dementia/stroke, eyes - glaucoma/blindness, heart - heart attack/heart failure, kidneys - dialysis, stomach - gastric paralysis, intestines - malabsorption, nerves - severe painful neuritis, circulation - gangrene & loss of a leg(s), and finally cancer and Alzheimers.    I recommend avoid fried & greasy foods,  sweets/candy, white rice (brown or wild rice or Quinoa is OK), white potatoes (sweet potatoes are OK) - anything made from white flour - bagels, doughnuts, rolls, buns, biscuits,white and wheat breads, pizza crust and traditional pasta made of white flour & egg white(vegetarian pasta or spinach or wheat pasta is OK).  Multi-grain bread is OK - like multi-grain flat bread or sandwich thins. Avoid alcohol in excess. Exercise is also important.    Eat all the vegetables you want - avoid meat, especially red meat and dairy - especially cheese.  Cheese is the most concentrated form of trans-fats which is the worst thing to clog up our arteries. Veggie cheese is OK which can be found in the fresh produce section at [employer] or [employer] or [employer]  ++++++++++++++++++++++ DASH Eating Plan  DASH stands for "Dietary Approaches to Stop Hypertension."   The DASH eating plan is a healthy eating plan that has been shown to reduce high blood pressure (hypertension). Additional health  benefits may include reducing the risk of type 2 diabetes mellitus, heart disease, and stroke. The DASH eating plan may also help with weight loss. WHAT DO I NEED TO KNOW ABOUT THE DASH EATING PLAN? For the DASH eating plan, you will follow these general guidelines:  Choose foods with a percent daily value for sodium of less than 5% (as listed on the food label).  Use salt-free seasonings or herbs instead of table salt or sea salt.  Check with your health care provider or pharmacist before using salt substitutes.  Eat lower-sodium products, often labeled as "lower sodium" or "no salt added."  Eat fresh foods.  Eat more vegetables, fruits, and low-fat dairy products.  Choose whole grains. Look for the word "whole" as the first word in the ingredient list.  Choose fish   Limit sweets, desserts, sugars, and sugary drinks.  Choose heart-healthy fats.  Eat veggie cheese   Eat more home-cooked food and less restaurant, buffet, and fast food.  Limit fried foods.  Cook foods using methods other than frying.  Limit canned vegetables. If you do use them, rinse them well to decrease the sodium.  When eating at a restaurant, ask that your food be prepared with less salt, or no salt if possible.                      WHAT FOODS CAN I EAT? Read Dr Joel Fuhrman's books on The End of Dieting & The End of Diabetes  Grains Whole grain or whole wheat bread. Brown rice. Whole grain or whole wheat pasta. Quinoa, bulgur, and whole grain cereals. Low-sodium cereals. Corn or whole wheat flour tortillas. Whole grain cornbread. Whole grain crackers. Low-sodium crackers.  Vegetables Fresh or frozen vegetables (raw, steamed, roasted, or grilled). Low-sodium or reduced-sodium tomato and vegetable juices. Low-sodium or reduced-sodium tomato sauce and paste. Low-sodium or   reduced-sodium canned vegetables.   Fruits All fresh, canned (in natural juice), or frozen fruits.  Protein Products  All fish  and seafood.  Dried beans, peas, or lentils. Unsalted nuts and seeds. Unsalted canned beans.  Dairy Low-fat dairy products, such as skim or 1% milk, 2% or reduced-fat cheeses, low-fat ricotta or cottage cheese, or plain low-fat yogurt. Low-sodium or reduced-sodium cheeses.  Fats and Oils Tub margarines without trans fats. Light or reduced-fat mayonnaise and salad dressings (reduced sodium). Avocado. Safflower, olive, or canola oils. Natural peanut or almond butter.  Other Unsalted popcorn and pretzels. The items listed above may not be a complete list of recommended foods or beverages. Contact your dietitian for more options.  ++++++++++++++++++++  WHAT FOODS ARE NOT RECOMMENDED? Grains/ White flour or wheat flour White bread. White pasta. White rice. Refined cornbread. Bagels and croissants. Crackers that contain trans fat.  Vegetables  Creamed or fried vegetables. Vegetables in a . Regular canned vegetables. Regular canned tomato sauce and paste. Regular tomato and vegetable juices.  Fruits Dried fruits. Canned fruit in light or heavy syrup. Fruit juice.  Meat and Other Protein Products Meat in general - RED meat & White meat.  Fatty cuts of meat. Ribs, chicken wings, all processed meats as bacon, sausage, bologna, salami, fatback, hot dogs, bratwurst and packaged luncheon meats.  Dairy Whole or 2% milk, cream, half-and-half, and cream cheese. Whole-fat or sweetened yogurt. Full-fat cheeses or blue cheese. Non-dairy creamers and whipped toppings. Processed cheese, cheese spreads, or cheese curds.  Condiments Onion and garlic salt, seasoned salt, table salt, and sea salt. Canned and packaged gravies. Worcestershire sauce. Tartar sauce. Barbecue sauce. Teriyaki sauce. Soy sauce, including reduced sodium. Steak sauce. Fish sauce. Oyster sauce. Cocktail sauce. Horseradish. Ketchup and mustard. Meat flavorings and tenderizers. Bouillon cubes. Hot sauce. Tabasco sauce. Marinades. Taco  seasonings. Relishes.  Fats and Oils Butter, stick margarine, lard, shortening and bacon fat. Coconut, palm kernel, or palm oils. Regular salad dressings.  Pickles and olives. Salted popcorn and pretzels.  The items listed above may not be a complete list of foods and beverages to avoid.    We Do NOT Approve of  [facility] [place] Soliciting Our Patients  To Do Home Visits  & We Do NOT Approve of [employer] > > > > > > > > > > > > > > > > > > > > > > > > > > > > > > > > > > >  > > > >   Preventive Care for Adults  A healthy lifestyle and preventive care can promote health and wellness. Preventive health guidelines for men include the following key practices:  A routine yearly physical is a good way to check with your health care provider about your health and preventative screening. It is a chance to share any concerns and updates on your health and to receive a thorough exam.  Visit your dentist for a routine exam and preventative care every 6 months. Brush your teeth twice a day and floss once a day. Good oral hygiene prevents tooth decay and gum disease.  The frequency of eye exams is based on your age, health, family medical history, use of contact lenses, and other factors. Follow your health care provider's recommendations for frequency of eye exams.  Eat a healthy diet. Foods such as vegetables, fruits, whole grains, low-fat dairy products, and lean protein foods contain the nutrients you need without too many calories. Decrease your intake of   foods high in solid fats, added sugars, and salt. Eat the right amount of calories for you. Get information about a proper diet from your health care provider, if necessary.  Regular physical exercise is one of the most important things you can do for your health. Most adults should get at least 150 minutes of moderate-intensity exercise (any activity that increases your heart rate and causes you to sweat) each week.  In addition, most adults need muscle-strengthening exercises on 2 or more days a week.  Maintain a healthy weight. The body mass index (BMI) is a screening tool to identify possible weight problems. It provides an estimate of body fat based on height and weight. Your health care provider can find your BMI and can help you achieve or maintain a healthy weight. For adults 20 years and older:  A BMI below 18.5 is considered underweight.  A BMI of 18.5 to 24.9 is normal.  A BMI of 25 to 29.9 is considered overweight.  A BMI of 30 and above is considered obese.  Maintain normal blood lipids and cholesterol levels by exercising and minimizing your intake of saturated fat. Eat a balanced diet with plenty of fruit and vegetables. Blood tests for lipids and cholesterol should begin at age 20 and be repeated every 5 years. If your lipid or cholesterol levels are high, you are over 50, or you are at high risk for heart disease, you may need your cholesterol levels checked more frequently. Ongoing high lipid and cholesterol levels should be treated with medicines if diet and exercise are not working.  If you smoke, find out from your health care provider how to quit. If you do not use tobacco, do not start.  Lung cancer screening is recommended for adults aged 55-80 years who are at high risk for developing lung cancer because of a history of smoking. A yearly low-dose CT scan of the lungs is recommended for people who have at least a 30-pack-year history of smoking and are a current smoker or have quit within the past 15 years. A pack year of smoking is smoking an average of 1 pack of cigarettes a day for 1 year (for example: 1 pack a day for 30 years or 2 packs a day for 15 years). Yearly screening should continue until the smoker has stopped smoking for at least 15 years. Yearly screening should be stopped for people who develop a health problem that would prevent them from having lung cancer treatment.  If  you choose to drink alcohol, do not have more than 2 drinks per day. One drink is considered to be 12 ounces (355 mL) of beer, 5 ounces (148 mL) of wine, or 1.5 ounces (44 mL) of liquor.  Avoid use of street drugs. Do not share needles with anyone. Ask for help if you need support or instructions about stopping the use of drugs.  High blood pressure causes heart disease and increases the risk of stroke. Your blood pressure should be checked at least every 1-2 years. Ongoing high blood pressure should be treated with medicines, if weight loss and exercise are not effective.  If you are 45-79 years old, ask your health care provider if you should take aspirin to prevent heart disease.  Diabetes screening involves taking a blood sample to check your fasting blood sugar level. Testing should be considered at a younger age or be carried out more frequently if you are overweight and have at least 1 risk factor for diabetes.    Colorectal cancer can be detected and often prevented. Most routine colorectal cancer screening begins at the age of 50 and continues through age 75. However, your health care provider may recommend screening at an earlier age if you have risk factors for colon cancer. On a yearly basis, your health care provider may provide home test kits to check for hidden blood in the stool. Use of a small camera at the end of a tube to directly examine the colon (sigmoidoscopy or colonoscopy) can detect the earliest forms of colorectal cancer. Talk to your health care provider about this at age 50, when routine screening begins. Direct exam of the colon should be repeated every 5-10 years through age 75, unless early forms of precancerous polyps or small growths are found.  Hepatitis C blood testing is recommended for all people born from 1945 through 1965 and any individual with known risks for hepatitis C.  Screening for abdominal aortic aneurysm (AAA)  by ultrasound is recommended for people who  have history of high blood pressure or who are current or former smokers.  Healthy men should  receive prostate-specific antigen (PSA) blood tests as part of routine cancer screening. Talk with your health care provider about prostate cancer screening.  Testicular cancer screening is  recommended for adult males. Screening includes self-exam, a health care provider exam, and other screening tests. Consult with your health care provider about any symptoms you have or any concerns you have about testicular cancer.  Use sunscreen. Apply sunscreen liberally and repeatedly throughout the day. You should seek shade when your shadow is shorter than you. Protect yourself by wearing long sleeves, pants, a wide-brimmed hat, and sunglasses year round, whenever you are outdoors.  Once a month, do a whole-body skin exam, using a mirror to look at the skin on your back. Tell your health care provider about new moles, moles that have irregular borders, moles that are larger than a pencil eraser, or moles that have changed in shape or color.  Stay current with required vaccines (immunizations).  Influenza vaccine. All adults should be immunized every year.  Tetanus, diphtheria, and acellular pertussis (Td, Tdap) vaccine. An adult who has not previously received Tdap or who does not know his vaccine status should receive 1 dose of Tdap. This initial dose should be followed by tetanus and diphtheria toxoids (Td) booster doses every 10 years. Adults with an unknown or incomplete history of completing a 3-dose immunization series with Td-containing vaccines should begin or complete a primary immunization series including a Tdap dose. Adults should receive a Td booster every 10 years.  Zoster vaccine. One dose is recommended for adults aged 60 years or older unless certain conditions are present.    PREVNAR - Pneumococcal 13-valent conjugate (PCV13) vaccine. When indicated, a person who is uncertain of his  immunization history and has no record of immunization should receive the PCV13 vaccine. An adult aged 19 years or older who has certain medical conditions and has not been previously immunized should receive 1 dose of PCV13 vaccine. This PCV13 should be followed with a dose of pneumococcal polysaccharide (PPSV23) vaccine. The PPSV23 vaccine dose should be obtained 1 or more year(s)after the dose of PCV13 vaccine. An adult aged 19 years or older who has certain medical conditions and previously received 1 or more doses of PPSV23 vaccine should receive 1 dose of PCV13. The PCV13 vaccine dose should be obtained 1 or more years after the last PPSV23 vaccine dose.    PNEUMOVAX -   Pneumococcal polysaccharide (PPSV23) vaccine. When PCV13 is also indicated, PCV13 should be obtained first. All adults aged 65 years and older should be immunized. An adult younger than age 65 years who has certain medical conditions should be immunized. Any person who resides in a nursing home or long-term care facility should be immunized. An adult smoker should be immunized. People with an immunocompromised condition and certain other conditions should receive both PCV13 and PPSV23 vaccines. People with human immunodeficiency virus (HIV) infection should be immunized as soon as possible after diagnosis. Immunization during chemotherapy or radiation therapy should be avoided. Routine use of PPSV23 vaccine is not recommended for American Indians, [place] Natives, or people younger than 65 years unless there are medical conditions that require PPSV23 vaccine. When indicated, people who have unknown immunization and have no record of immunization should receive PPSV23 vaccine. One-time revaccination 5 years after the first dose of PPSV23 is recommended for people aged 19-64 years who have chronic kidney failure, nephrotic syndrome, asplenia, or immunocompromised conditions. People who received 1-2 doses of PPSV23 before age 65 years should  receive another dose of PPSV23 vaccine at age 65 years or later if at least 5 years have passed since the previous dose. Doses of PPSV23 are not needed for people immunized with PPSV23 at or after age 65 years.    Hepatitis A vaccine. Adults who wish to be protected from this disease, have certain high-risk conditions, work with hepatitis A-infected animals, work in hepatitis A research labs, or travel to or work in countries with a high rate of hepatitis A should be immunized. Adults who were previously unvaccinated and who anticipate close contact with an international adoptee during the first 60 days after arrival in the [place] from a country with a high rate of hepatitis A should be immunized.    Hepatitis B vaccine. Adults should be immunized if they wish to be protected from this disease, have certain high-risk conditions, may be exposed to blood or other infectious body fluids, are household contacts or sex partners of hepatitis B positive people, are clients or workers in certain care facilities, or travel to or work in countries with a high rate of hepatitis B.   Preventive Service / Frequency   Ages 65 and over  Blood pressure check.  Lipid and cholesterol check.  Lung cancer screening. / Every year if you are aged 55-80 years and have a 30-pack-year history of smoking and currently smoke or have quit within the past 15 years. Yearly screening is stopped once you have quit smoking for at least 15 years or develop a health problem that would prevent you from having lung cancer treatment.  Fecal occult blood test (FOBT) of stool. You may not have to do this test if you get a colonoscopy every 10 years.  Flexible sigmoidoscopy** or colonoscopy.** / Every 5 years for a flexible sigmoidoscopy or every 10 years for a colonoscopy beginning at age 50 and continuing until age 75.  Hepatitis C blood test.** / For all people born from 1945 through 1965 and any individual with known  risks for hepatitis C.  Abdominal aortic aneurysm (AAA) screening./ Screening current or former smokers or have Hypertension.  Skin self-exam. / Monthly.  Influenza vaccine. / Every year.  Tetanus, diphtheria, and acellular pertussis (Tdap/Td) vaccine.** / 1 dose of Td every 10 years.   Zoster vaccine.** / 1 dose for adults aged 60 years or older.         Pneumococcal 13-valent   conjugate (PCV13) vaccine.    Pneumococcal polysaccharide (PPSV23) vaccine.     Hepatitis A vaccine.** / Consult your health care provider.  Hepatitis B vaccine.** / Consult your health care provider. Screening for abdominal aortic aneurysm (AAA)  by ultrasound is recommended for people who have history of high blood pressure or who are current or former smokers. ++++++++++ Recommend Adult Low Dose Aspirin or  coated  Aspirin 81 mg daily  To reduce risk of Colon Cancer 20 %,  Skin Cancer 26 % ,  Malignant Melanoma 46%  and  Pancreatic cancer 60% ++++++++++++++++++++++ Vitamin D goal  is between 70-100.  Please make sure that you are taking your Vitamin D as directed.  It is very important as a natural anti-inflammatory  helping hair, skin, and nails, as well as reducing stroke and heart attack risk.  It helps your bones and helps with mood. It also decreases numerous cancer risks so please take it as directed.  Low Vit D is associated with a 200-300% higher risk for CANCER  and 200-300% higher risk for HEART   ATTACK  &  STROKE.   ...................................... It is also associated with higher death rate at younger ages,  autoimmune diseases like Rheumatoid arthritis, Lupus, Multiple Sclerosis.    Also many other serious conditions, like depression, Alzheimer's Dementia, infertility, muscle aches, fatigue, fibromyalgia - just to name a few. ++++++++++++++++++++++ Recommend the book "The END of DIETING" by Dr Joel Fuhrman  & the book "The END of DIABETES " by Dr Joel Fuhrman At  [employer] - get book & Audio CD's    Being diabetic has a  300% increased risk for heart attack, stroke, cancer, and alzheimer- type vascular dementia. It is very important that you work harder with diet by avoiding all foods that are white. Avoid white rice (brown & wild rice is OK), white potatoes (sweetpotatoes in moderation is OK), White bread or wheat bread or anything made out of white flour like bagels, donuts, rolls, buns, biscuits, cakes, pastries, cookies, pizza crust, and pasta (made from white flour & egg whites) - vegetarian pasta or spinach or wheat pasta is OK. Multigrain breads like Arnold's or [employer], or multigrain sandwich thins or flatbreads.  Diet, exercise and weight loss can reverse and cure diabetes in the early stages.  Diet, exercise and weight loss is very important in the control and prevention of complications of diabetes which affects every system in your body, ie. Brain - dementia/stroke, eyes - glaucoma/blindness, heart - heart attack/heart failure, kidneys - dialysis, stomach - gastric paralysis, intestines - malabsorption, nerves - severe painful neuritis, circulation - gangrene & loss of a leg(s), and finally cancer and Alzheimers.    I recommend avoid fried & greasy foods,  sweets/candy, white rice (brown or wild rice or Quinoa is OK), white potatoes (sweet potatoes are OK) - anything made from white flour - bagels, doughnuts, rolls, buns, biscuits,white and wheat breads, pizza crust and traditional pasta made of white flour & egg white(vegetarian pasta or spinach or wheat pasta is OK).  Multi-grain bread is OK - like multi-grain flat bread or sandwich thins. Avoid alcohol in excess. Exercise is also important.    Eat all the vegetables you want - avoid meat, especially red meat and dairy - especially cheese.  Cheese is the most concentrated form of trans-fats which is the worst thing to clog up our arteries. Veggie cheese is OK which can be found in the fresh  produce section at [employer]   or [employer] or [employer]  ++++++++++++++++++++++ DASH Eating Plan  DASH stands for "Dietary Approaches to Stop Hypertension."   The DASH eating plan is a healthy eating plan that has been shown to reduce high blood pressure (hypertension). Additional health benefits may include reducing the risk of type 2 diabetes mellitus, heart disease, and stroke. The DASH eating plan may also help with weight loss. WHAT DO I NEED TO KNOW ABOUT THE DASH EATING PLAN? For the DASH eating plan, you will follow these general guidelines:  Choose foods with a percent daily value for sodium of less than 5% (as listed on the food label).  Use salt-free seasonings or herbs instead of table salt or sea salt.  Check with your health care provider or pharmacist before using salt substitutes.  Eat lower-sodium products, often labeled as "lower sodium" or "no salt added."  Eat fresh foods.  Eat more vegetables, fruits, and low-fat dairy products.  Choose whole grains. Look for the word "whole" as the first word in the ingredient list.  Choose fish   Limit sweets, desserts, sugars, and sugary drinks.  Choose heart-healthy fats.  Eat veggie cheese   Eat more home-cooked food and less restaurant, buffet, and fast food.  Limit fried foods.  Cook foods using methods other than frying.  Limit canned vegetables. If you do use them, rinse them well to decrease the sodium.  When eating at a restaurant, ask that your food be prepared with less salt, or no salt if possible.                      WHAT FOODS CAN I EAT? Read Dr Joel Fuhrman's books on The End of Dieting & The End of Diabetes  Grains Whole grain or whole wheat bread. Brown rice. Whole grain or whole wheat pasta. Quinoa, bulgur, and whole grain cereals. Low-sodium cereals. Corn or whole wheat flour tortillas. Whole grain cornbread. Whole grain crackers. Low-sodium crackers.  Vegetables Fresh or frozen  vegetables (raw, steamed, roasted, or grilled). Low-sodium or reduced-sodium tomato and vegetable juices. Low-sodium or reduced-sodium tomato sauce and paste. Low-sodium or reduced-sodium canned vegetables.   Fruits All fresh, canned (in natural juice), or frozen fruits.  Protein Products  All fish and seafood.  Dried beans, peas, or lentils. Unsalted nuts and seeds. Unsalted canned beans.  Dairy Low-fat dairy products, such as skim or 1% milk, 2% or reduced-fat cheeses, low-fat ricotta or cottage cheese, or plain low-fat yogurt. Low-sodium or reduced-sodium cheeses.  Fats and Oils Tub margarines without trans fats. Light or reduced-fat mayonnaise and salad dressings (reduced sodium). Avocado. Safflower, olive, or canola oils. Natural peanut or almond butter.  Other Unsalted popcorn and pretzels. The items listed above may not be a complete list of recommended foods or beverages. Contact your dietitian for more options.  ++++++++++++++++++++  WHAT FOODS ARE NOT RECOMMENDED? Grains/ White flour or wheat flour White bread. White pasta. White rice. Refined cornbread. Bagels and croissants. Crackers that contain trans fat.  Vegetables  Creamed or fried vegetables. Vegetables in a . Regular canned vegetables. Regular canned tomato sauce and paste. Regular tomato and vegetable juices.  Fruits Dried fruits. Canned fruit in light or heavy syrup. Fruit juice.  Meat and Other Protein Products Meat in general - RED meat & White meat.  Fatty cuts of meat. Ribs, chicken wings, all processed meats as bacon, sausage, bologna, salami, fatback, hot dogs, bratwurst and packaged luncheon meats.  Dairy Whole or 2% milk, cream,   half-and-half, and cream cheese. Whole-fat or sweetened yogurt. Full-fat cheeses or blue cheese. Non-dairy creamers and whipped toppings. Processed cheese, cheese spreads, or cheese curds.  Condiments Onion and garlic salt, seasoned salt, table salt, and sea salt. Canned and  packaged gravies. Worcestershire sauce. Tartar sauce. Barbecue sauce. Teriyaki sauce. Soy sauce, including reduced sodium. Steak sauce. Fish sauce. Oyster sauce. Cocktail sauce. Horseradish. Ketchup and mustard. Meat flavorings and tenderizers. Bouillon cubes. Hot sauce. Tabasco sauce. Marinades. Taco seasonings. Relishes.  Fats and Oils Butter, stick margarine, lard, shortening and bacon fat. Coconut, palm kernel, or palm oils. Regular salad dressings.  Pickles and olives. Salted popcorn and pretzels.  The items listed above may not be a complete list of foods and beverages to avoid.    

## 2017-10-18 NOTE — Progress Notes (Signed)
[facility]   Unk Pinto, M.D.     Uvaldo Bristle. Silverio Lay, P.A.-C Liane Comber, [place]                [address] [place], [place] [address] Telephone [phone] Telefax [phone]    Comprehensive Evaluation & Examination     This very nice 72 y.o. MWM presents for a  comprehensive evaluation and management of multiple medical co-morbidities.  Patient has been followed for HTN, [name], [name]  and Vitamin D Deficiency.     HTN predates circa 1989. Patient's BP has been controlled at home.  Today's BP is at goal - 118/68.  In 2001, patient  Had PCA w/Stent implanted and in 2008 underwent CABG. He had Negative Cardiolites in 2013 and 2014 and a negative ETT in 2017 and patient is followed by Dr Sallyanne Kuster.  Patient denies any cardiac symptoms as chest pain, palpitations, shortness of breath, dizziness or ankle swelling.     Patient's hyperlipidemia is not controlled with diet and medications. Patient has hx/o severe disabling myalgias on Statins. Last lipids were not at goal: Lab Results  Component Value Date   CHOL 176 06/20/2017   HDL 47 06/20/2017   LDLCALC 106 (H) 06/20/2017   TRIG 135 06/20/2017   CHOLHDL 3.7 06/20/2017      Patient has [name] (2000) w/CKD2 and patient denies reactive hypoglycemic symptoms, visual blurring, diabetic polys or paresthesias. Last A1c was not at goal: Lab Results  Component Value Date   HGBA1C 6.8 (H) 10/18/2017       Patient has been on Thyroid Replacement since 2013. Finally, patient has history of Vitamin D Deficiency ("33"/2008) and last vitamin D was at goal: Lab Results  Component Value Date   VD25OH 58 10/18/2017   Current Outpatient Medications on File Prior to Visit  Medication Sig  . acyclovir (ZOVIRAX) 800 MG tablet Take 1 tablet (800 mg total) by mouth 3 (three) times daily as needed (fever blister).  Marland Kitchen aspirin 325 MG tablet Take 325 mg  by mouth at bedtime.   Marland Kitchen atenolol (TENORMIN) 50 MG tablet TAKE 1 TABLET BY MOUTH EVERY DAY  . Cholecalciferol 5000 UNITS TABS Take 1 tablet by mouth daily.  Marland Kitchen ezetimibe (ZETIA) 10 MG tablet Take 1 tablet (10 mg total) by mouth daily.  . IRON CR PO Take 1 tablet by mouth daily.  Marland Kitchen levothyroxine (SYNTHROID, LEVOTHROID) 50 MCG tablet TAKE 1 TABLET EVERY MORNING ON AN EMPTY STOMACH WITH ONLY WATER FOR 30 MINUTES  . losartan (COZAAR) 50 MG tablet TAKE 1/2 TO 1 TABLET BY MOUTH EVERY DAY FOR BLOOD PRESSURE  . meclizine (ANTIVERT) 25 MG tablet 1/2-1 pill up to 3 times daily for motion sickness/dizziness  . metFORMIN (GLUCOPHAGE XR) 500 MG 24 hr tablet Take 2 tablets 2 x / day with food for Diabetes  . Multiple Vitamin (MULTIVITAMIN) tablet Take 1 tablet by mouth at bedtime.   . Omega-3 Fatty Acids (FISH OIL) 1000 MG CAPS Take 1,000 mg by mouth at bedtime.   Marland Kitchen omeprazole (PRILOSEC) 20 MG capsule Take 20 mg by mouth as needed. OTC about 3 times a week.   No current facility-administered medications on file prior to visit.    Allergies  Allergen Reactions  . Gemfibrozil Other (See Comments)    Muscle aches  . Ace Inhibitors Cough  . Lipitor [Atorvastatin]  Other (See Comments)    Joint pain.   Past Medical History:  Diagnosis Date  . Anemia    low iron  . Arthritis   . Cancer ([place])    skin cancer on face  . Cataracts, bilateral   . Coronary artery disease    CABG - 2008  . Diabetes mellitus without complication ([place])    type II  . GERD (gastroesophageal reflux disease)   . Hepatitis   . Hyperlipidemia   . Hypertension    pt states he does not have HTN, takes Atenolol for migraines  . Hypothyroidism   . Migraine headache with aura    takes Atenolol  . Sleep apnea    does not use cpap   Health Maintenance  Topic Date Due  . Hepatitis C Screening  May 28, 1946  . OPHTHALMOLOGY EXAM  10/20/2017  . INFLUENZA VACCINE  02/01/2018  . HEMOGLOBIN A1C  04/19/2018  . FOOT EXAM  10/19/2018   . Fecal DNA (Cologuard)  08/18/2019  . TETANUS/TDAP  06/03/2025  . PNA vac Low Risk Adult  Completed   Immunization History  Administered Date(s) Administered  . DT 08/29/2003, 06/04/2015  . Influenza-Unspecified 04/16/2014, 03/30/2016, 04/02/2017  . Pneumococcal Conjugate-13 12/26/2013  . Pneumococcal Polysaccharide-23 01/29/2010  . Zoster 05/11/2010  . Zoster Recombinat (Shingrix) 01/31/2017, 07/05/2017   Patient had a (+) Cologard on 08/17/2016 and on 09/07/2016 had a Negative Colonoscopy  - Dr Amedeo Plenty recommended  No F/U    Past Surgical History:  Procedure Laterality Date  . BACK SURGERY    . CARDIAC SURGERY     Bypass  . CATARACT EXTRACTION, BILATERAL Bilateral 2018   Dr. Gershon Crane  . COLONOSCOPY    . CORONARY ANGIOPLASTY  2001  . CORONARY ARTERY BYPASS GRAFT    . ELBOW SURGERY Left    due to damaged nerve  . KNEE SURGERY Right    arthroscopic  . LUMBAR LAMINECTOMY/DECOMPRESSION MICRODISCECTOMY Right 08/04/2014   Procedure: Right Lumbar four-five Microdiskectomy;  Surgeon: Kristeen Miss, MD;  Location: [facility];  Service: Neurosurgery;  Laterality: Right;  Right L4-5 Microdiskectomy  . SURGERY SCROTAL / TESTICULAR     as a child/teenager   Family History  Problem Relation Age of Onset  . Ulcers Mother   . Arthritis Mother   . Heart attack Father   . CAD Sister   . Heart attack Brother   . CAD Brother   . Hypertension Brother   . Diabetes Maternal Grandmother    Social History   Socioeconomic History  . Marital status: Married    Spouse name: Not on file  . Number of children: 1 daughter   . Years of education: Not on file  . Highest education level: Not on file  Occupational History  . Retired Photographer   Tobacco Use  . Smoking status: Former Smoker    Last attempt to quit: 10/01/1973    Years since quitting: 44.0  . Smokeless tobacco: Never Used  Substance and Sexual Activity  . Alcohol use: No  . Drug use: No  . Sexual activity: Not on file     ROS Constitutional: Denies fever, chills, weight loss/gain, headaches, insomnia,  night sweats or change in appetite. Does c/o fatigue. Eyes: Denies redness, blurred vision, diplopia, discharge, itchy or watery eyes.  ENT: Denies discharge, congestion, post nasal drip, epistaxis, sore throat, earache, hearing loss, dental pain, Tinnitus, Vertigo, Sinus pain or snoring.  Cardio: Denies chest pain, palpitations, irregular heartbeat, syncope, dyspnea, diaphoresis, orthopnea,  PND, claudication or edema Respiratory: denies cough, dyspnea, DOE, pleurisy, hoarseness, laryngitis or wheezing.  Gastrointestinal: Denies dysphagia, heartburn, reflux, water brash, pain, cramps, nausea, vomiting, bloating, diarrhea, constipation, hematemesis, melena, hematochezia, jaundice or hemorrhoids Genitourinary: Denies dysuria, frequency, urgency, nocturia, hesitancy, discharge, hematuria or flank pain Musculoskeletal: Denies arthralgia, myalgia, stiffness, Jt. Swelling, pain, limp or strain/sprain. Denies Falls. Skin: Denies puritis, rash, hives, warts, acne, eczema or change in skin lesion Neuro: No weakness, tremor, incoordination, spasms, paresthesia or pain Psychiatric: Denies confusion, memory loss or sensory loss. Denies Depression. Endocrine: Denies change in weight, skin, hair change, nocturia, and paresthesia, diabetic polys, visual blurring or hyper / hypo glycemic episodes.  Heme/Lymph: No excessive bleeding, bruising or enlarged lymph nodes.  Physical Exam  BP 118/68   Pulse 72   Temp 97.6 F (36.4 C)   Resp 16   Ht 5\' 9"  (1.753 m)   Wt 188 lb 6.4 oz (85.5 kg)   BMI 27.82 kg/m   General Appearance: Well nourished and well groomed and in no apparent distress.  Eyes: PERRLA, EOMs, conjunctiva no swelling or erythema, normal fundi and vessels. Sinuses: No frontal/maxillary tenderness ENT/Mouth: EACs patent / TMs  nl. Nares clear without erythema, swelling, mucoid exudates. Oral hygiene is good. No  erythema, swelling, or exudate. Tongue normal, non-obstructing. Tonsils not swollen or erythematous. Hearing normal.  Neck: Supple, thyroid not palpable. No bruits, nodes or JVD. Respiratory: Respiratory effort normal.  BS equal and clear bilateral without rales, rhonci, wheezing or stridor. Cardio: Heart sounds are normal with regular rate and rhythm and no murmurs, rubs or gallops. Peripheral pulses are normal and equal bilaterally without edema. No aortic or femoral bruits. Chest: symmetric with normal excursions and percussion.  Abdomen: Soft, with Nl bowel sounds. Nontender, no guarding, rebound, hernias, masses, or organomegaly.  Lymphatics: Non tender without lymphadenopathy.  Genitourinary: No hernias.Testes nl. DRE - prostate nl for age - smooth & firm w/o nodules. Musculoskeletal: Full ROM all peripheral extremities, joint stability, 5/5 strength, and normal gait. Skin: Warm and dry without rashes, lesions, cyanosis, clubbing or  ecchymosis.  Neuro: Cranial nerves intact, reflexes equal bilaterally. Normal muscle tone, no cerebellar symptoms. Sensation intact.  Pysch: Alert and oriented X 3 with normal affect, insight and judgment appropriate.   Assessment and Plan  1. Essential hypertension  - [place], RETROPERITNL ABD,  LTD - Urinalysis, Routine w reflex microscopic - Microalbumin / creatinine urine ratio - CBC with Differential/Platelet - Magnesium - COMPLETE METABOLIC PANEL WITH GFR  2. Hyperlipidemia, mixed  - [place], RETROPERITNL ABD,  LTD - TSH - Cardio IQ (R) Advanced Lipid Panel; Future - Cardio IQ (R) Advanced Lipid Panel - COMPLETE METABOLIC PANEL WITH GFR  3. Type 2 diabetes mellitus with stage 2 chronic kidney disease, without long-term current use of insulin (HCC)  - [place], RETROPERITNL ABD,  LTD - Urinalysis, Routine w reflex microscopic - Microalbumin / creatinine urine ratio - HM DIABETES FOOT EXAM - LOW EXTREMITY NEUR EXAM DOCUM - Hemoglobin A1c - Insulin,  random - COMPLETE METABOLIC PANEL WITH GFR  4. Vitamin D deficiency  - VITAMIN D 25 Hydroxy  5. Coronary artery disease involving coronary bypass graft of native heart with other forms of angina pectoris (HCC)  - Cardio IQ (R) Advanced Lipid Panel; Future - Cardio IQ (R) Advanced Lipid Panel  6. Hypothyroidism  7. Atherosclerosis of abdominal aorta (HCC)  - [place], RETROPERITNL ABD,  LTD - CBC with Differential/Platelet - Cardio IQ (R) Advanced Lipid Panel; Future - Cardio  IQ (R) Advanced Lipid Panel  8. Gastroesophageal reflux disease   9. Screening for colorectal cancer  - POC Hemoccult Bld/Stl   10. Benign prostatic hyperplasia with urinary obstruction  - PSA  11. Screening for ischemic heart disease  - Cardio IQ (R) Advanced Lipid Panel; Future - Cardio IQ (R) Advanced Lipid Panel  12. Screening for AAA (aortic abdominal aneurysm)  - [place], RETROPERITNL ABD,  LTD  13. Former smoker  - [place], RETROPERITNL ABD,  [place]  14. FHx: heart disease  - [place], RETROPERITNL ABD,  LTD  15. Medication management  - Urinalysis, Routine w reflex microscopic - Microalbumin / creatinine urine ratio - Magnesium - TSH - Hemoglobin A1c - Insulin, random - VITAMIN D 25 Hydroxy - Cardio IQ (R) Advanced Lipid Panel; Future - Cardio IQ (R) Advanced Lipid Panel - COMPLETE METABOLIC PANEL WITH GFR              Patient was counseled in prudent diet, weight control to achieve/maintain BMI less than 25, BP monitoring, regular exercise and medications as discussed.  Discussed med effects and SE's. Routine screening labs and tests as requested with regular follow-up as recommended. Over 40 minutes of exam, counseling, chart review and high complex critical decision making was performed

## 2017-10-19 ENCOUNTER — Ambulatory Visit (HOSPITAL_COMMUNITY)
Admission: RE | Admit: 2017-10-19 | Discharge: 2017-10-19 | Disposition: A | Discharge status: Home / Self Care | Payer: Medicare Other | Source: Ambulatory Visit | Attending: Cardiovascular Disease | Admitting: Cardiovascular Disease

## 2017-10-19 DIAGNOSIS — I25810 Atherosclerosis of coronary artery bypass graft(s) without angina pectoris: Secondary | ICD-10-CM | POA: Insufficient documentation

## 2017-10-19 LAB — CBC WITH DIFFERENTIAL/PLATELET
Basophils Absolute: 39 cells/uL (ref 0–200)
Basophils Relative: 0.8 %
EOS ABS: 221 {cells}/uL (ref 15–500)
Eosinophils Relative: 4.5 %
HCT: 40.5 % (ref 38.5–50.0)
Hemoglobin: 14.1 g/dL (ref 13.2–17.1)
Lymphs Abs: 1656 cells/uL (ref 850–3900)
MCH: 34.9 pg — ABNORMAL HIGH (ref 27.0–33.0)
MCHC: 34.8 g/dL (ref 32.0–36.0)
MCV: 100.2 fL — ABNORMAL HIGH (ref 80.0–100.0)
MONOS PCT: 9.9 %
MPV: 9.7 fL (ref 7.5–12.5)
NEUTROS PCT: 51 %
Neutro Abs: 2499 cells/uL (ref 1500–7800)
PLATELETS: 186 10*3/uL (ref 140–400)
RBC: 4.04 10*6/uL — ABNORMAL LOW (ref 4.20–5.80)
RDW: 12.1 % (ref 11.0–15.0)
TOTAL LYMPHOCYTE: 33.8 %
WBC: 4.9 10*3/uL (ref 3.8–10.8)
WBCMIX: 485 {cells}/uL (ref 200–950)

## 2017-10-19 LAB — COMPLETE METABOLIC PANEL WITH GFR
AG Ratio: 1.8 (calc) (ref 1.0–2.5)
ALKALINE PHOSPHATASE (APISO): 50 U/L (ref 40–115)
ALT: 31 U/L (ref 9–46)
AST: 26 U/L (ref 10–35)
Albumin: 4.5 g/dL (ref 3.6–5.1)
BILIRUBIN TOTAL: 0.8 mg/dL (ref 0.2–1.2)
BUN: 19 mg/dL (ref 7–25)
CO2: 32 mmol/L (ref 20–32)
Calcium: 9.7 mg/dL (ref 8.6–10.3)
Chloride: 104 mmol/L (ref 98–110)
Creat: 1.01 mg/dL (ref 0.70–1.18)
GFR, Est African American: 85 mL/min/{1.73_m2} (ref >=60)
GFR, Est Non African American: 73 mL/min/{1.73_m2} (ref >=60)
GLUCOSE: 134 mg/dL — AB (ref 65–99)
Globulin: 2.5 g/dL (calc) (ref 1.9–3.7)
Potassium: 5.4 mmol/L — ABNORMAL HIGH (ref 3.5–5.3)
Sodium: 142 mmol/L (ref 135–146)
Total Protein: 7 g/dL (ref 6.1–8.1)

## 2017-10-19 LAB — URINALYSIS, ROUTINE W REFLEX MICROSCOPIC
BILIRUBIN URINE: NEGATIVE
GLUCOSE, UA: NEGATIVE
HGB URINE DIPSTICK: NEGATIVE
KETONES UR: NEGATIVE
Leukocytes, UA: NEGATIVE
Nitrite: NEGATIVE
PH: 5.5 (ref 5.0–8.0)
PROTEIN: NEGATIVE
Specific Gravity, Urine: 1.016 (ref 1.001–1.03)

## 2017-10-19 LAB — MAGNESIUM: MAGNESIUM: 2.1 mg/dL (ref 1.5–2.5)

## 2017-10-19 LAB — EXERCISE TOLERANCE TEST
CHL CUP RESTING HR STRESS: 78 {beats}/min
CSEPED: 6 min
CSEPEDS: 0 s
CSEPEW: 7 METS
CSEPHR: 88 %
MPHR: 147 {beats}/min
Peak HR: 130 {beats}/min
RPE: 18

## 2017-10-19 LAB — HEMOGLOBIN A1C
Hgb A1c MFr Bld: 6.8 % of total Hgb — ABNORMAL HIGH (ref <5.7)
MEAN PLASMA GLUCOSE: 148 (calc)
eAG (mmol/L): 8.2 (calc)

## 2017-10-19 LAB — MICROALBUMIN / CREATININE URINE RATIO
Creatinine, Urine: 90 mg/dL (ref 20–320)
Microalb Creat Ratio: 9 mcg/mg creat (ref <30)
Microalb, Ur: 0.8 mg/dL

## 2017-10-19 LAB — PSA: PSA: 0.6 ng/mL (ref <=4.0)

## 2017-10-19 LAB — VITAMIN D 25 HYDROXY (VIT D DEFICIENCY, FRACTURES): Vit D, 25-Hydroxy: 80 ng/mL (ref 30–100)

## 2017-10-19 LAB — TSH: TSH: 2.45 mIU/L (ref 0.40–4.50)

## 2017-10-19 LAB — INSULIN, RANDOM: Insulin: 12.4 u[IU]/mL (ref 2.0–19.6)

## 2017-10-21 ENCOUNTER — Encounter: Payer: Self-pay | Admitting: Internal Medicine

## 2017-11-06 ENCOUNTER — Other Ambulatory Visit: Payer: Self-pay | Admitting: Internal Medicine

## 2017-11-08 ENCOUNTER — Encounter: Payer: Self-pay | Admitting: Internal Medicine

## 2017-11-13 ENCOUNTER — Other Ambulatory Visit: Payer: Self-pay

## 2017-11-13 DIAGNOSIS — Z1212 Encounter for screening for malignant neoplasm of rectum: Principal | ICD-10-CM

## 2017-11-13 DIAGNOSIS — Z1211 Encounter for screening for malignant neoplasm of colon: Secondary | ICD-10-CM

## 2017-11-13 LAB — POC HEMOCCULT BLD/STL (HOME/3-CARD/SCREEN)
Card #2 Fecal Occult Blod, POC: NEGATIVE
FECAL OCCULT BLD: NEGATIVE
FECAL OCCULT BLD: NEGATIVE

## 2017-11-14 DIAGNOSIS — Z1211 Encounter for screening for malignant neoplasm of colon: Secondary | ICD-10-CM | POA: Diagnosis not present

## 2017-12-23 ENCOUNTER — Other Ambulatory Visit: Payer: Self-pay | Admitting: Internal Medicine

## 2018-01-07 ENCOUNTER — Other Ambulatory Visit: Payer: Self-pay | Admitting: Internal Medicine

## 2018-02-02 DIAGNOSIS — E1122 Type 2 diabetes mellitus with diabetic chronic kidney disease: Secondary | ICD-10-CM | POA: Insufficient documentation

## 2018-02-02 DIAGNOSIS — E663 Overweight: Secondary | ICD-10-CM | POA: Insufficient documentation

## 2018-02-02 DIAGNOSIS — N182 Chronic kidney disease, stage 2 (mild): Secondary | ICD-10-CM

## 2018-02-02 DIAGNOSIS — Z6824 Body mass index (BMI) 24.0-24.9, adult: Secondary | ICD-10-CM | POA: Insufficient documentation

## 2018-02-02 NOTE — Progress Notes (Signed)
FOLLOW UP  Assessment and Plan:   Essential hypertension Well controlled; continue medication Monitor blood pressure at home; call if consistently over 130/80 Continue DASH diet.   Reminder to go to the ER if any CP, SOB, nausea, dizziness, severe HA, changes vision/speech, left arm numbness and tingling and jaw pain.  Coronary artery disease involving coronary bypass graft of native heart, Other angina ([place]) very infrequent CCS class II exertional angina pectoris; continue BB; followed by cardiology (Dr. Sallyanne Kuster) Continue ASA, close management of cholesterol   Gastroesophageal reflux disease, esophagitis presence not specified Well managed with PRN OTC medications Discussed diet, avoiding triggers and other lifestyle changes  Type 2 diabetes mellitus with stage 2 chronic kidney disease, without long-term current use of insulin (HCC) Continue metformin Continue diet and exercise.  Perform daily foot/skin check, notify office of any concerning changes.  -     Hemoglobin A1c  Hypothyroidism, unspecified type Continue synthroid reminded to take on an empty stomach 30-55mins before food.  -     TSH  Mixed hyperlipidemia Intolerant of statins; treated by zetia, gemfibrozil, omega 3 Continue low cholesterol diet and exercise.  -     Lipid panel  Vitamin D deficiency Continue supplementation At goal at last visit; defer checking level  Continue diet and meds as discussed. Further disposition pending results of labs. Discussed med's effects and SE's.   Over 30 minutes of exam, counseling, chart review, and critical decision making was performed.   Future Appointments  Date Time Provider [place]  05/15/2018  9:30 AM Unk Pinto, MD [facility] None  11/15/2018 10:00 AM Unk Pinto, MD [facility] None    ----------------------------------------------------------------------------------------------------------------------  HPI 72 y.o. male  presents for 3  month follow up on hypertension, cholesterol, diabetes, weight and vitamin D deficiency. Patient has ASCAD with stenting in 2001, CABG 2008 with very infrequent CCS class II exertional angina pectoris and followed by Dr. Sallyanne Kuster, reports no recent angina.   BMI is Body mass index is 27.91 kg/m., he has been working on diet and reports he is active throughout the day, doesn't sit much, but not intentionally exercising.  Wt Readings from Last 3 Encounters:  02/05/18 189 lb (85.7 kg)  10/18/17 188 lb 6.4 oz (85.5 kg)  10/09/17 190 lb (86.2 kg)   His blood pressure has been controlled at home (115-130/60-70 at home), today their BP is BP: 136/74  He does not workout. He denies chest pain, shortness of breath, dizziness.   He is on cholesterol medication (zetia, omega 3 due to statin intolerance) and denies myalgias. His cholesterol is not at goal. The cholesterol last visit was:   Lab Results  Component Value Date   CHOL 176 06/20/2017   HDL 47 06/20/2017   LDLCALC 106 (H) 06/20/2017   TRIG 135 06/20/2017   CHOLHDL 3.7 06/20/2017    He has been working on diet for T2 diabetes on meformin (recently increased to 2000 mg daily), and denies foot ulcerations, increased appetite, nausea, paresthesia of the feet, polydipsia, polyuria, visual disturbances, vomiting and weight loss. He does check fasting sugars, recently consistently running in 120-130s. Last A1C in the office was:  Lab Results  Component Value Date   HGBA1C 6.8 (H) 10/18/2017   He is on thyroid medication. His medication was not changed last visit.   Lab Results  Component Value Date   TSH 2.45 10/18/2017   Patient is on Vitamin D supplement.   Lab Results  Component Value Date   VD25OH 100  10/18/2017       Current Medications:  Current Outpatient Medications on File Prior to Visit  Medication Sig  . acyclovir (ZOVIRAX) 800 MG tablet Take 1 tablet (800 mg total) by mouth 3 (three) times daily as needed (fever blister).   Marland Kitchen aspirin 325 MG tablet Take 325 mg by mouth at bedtime.   Marland Kitchen atenolol (TENORMIN) 50 MG tablet TAKE 1 TABLET BY MOUTH EVERY DAY  . Cholecalciferol 5000 UNITS TABS Take 1 tablet by mouth daily.  Marland Kitchen ezetimibe (ZETIA) 10 MG tablet Take 1 tablet (10 mg total) by mouth daily.  . IRON CR PO Take 1 tablet by mouth daily.  Marland Kitchen levothyroxine (SYNTHROID, LEVOTHROID) 50 MCG tablet TAKE 1 TABLET EVERY MORNING ON AN EMPTY STOMACH WITH ONLY WATER FOR 30 MINUTES  . losartan (COZAAR) 50 MG tablet TAKE 1/2 TO 1 TABLET BY MOUTH EVERY DAY FOR BLOOD PRESSURE  . meclizine (ANTIVERT) 25 MG tablet 1/2-1 pill up to 3 times daily for motion sickness/dizziness  . metFORMIN (GLUCOPHAGE XR) 500 MG 24 hr tablet Take 2 tablets 2 x / day with food for Diabetes  . Multiple Vitamin (MULTIVITAMIN) tablet Take 1 tablet by mouth at bedtime.   . Omega-3 Fatty Acids (FISH OIL) 1000 MG CAPS Take 1,000 mg by mouth at bedtime.   Marland Kitchen omeprazole (PRILOSEC) 20 MG capsule Take 20 mg by mouth as needed. OTC about 3 times a week.   No current facility-administered medications on file prior to visit.      Allergies:  Allergies  Allergen Reactions  . Gemfibrozil Other (See Comments)    Muscle aches  . Ace Inhibitors Cough  . Lipitor [Atorvastatin] Other (See Comments)    Joint pain.     Medical History:  Past Medical History:  Diagnosis Date  . Anemia    low iron  . Arthritis   . Cancer ([place])    skin cancer on face  . Cataracts, bilateral   . Coronary artery disease    CABG - 2008  . Diabetes mellitus without complication ([place])    type II  . GERD (gastroesophageal reflux disease)   . Hepatitis   . Hyperlipidemia   . Hypertension    pt states he does not have HTN, takes Atenolol for migraines  . Hypothyroidism   . Migraine headache with aura    takes Atenolol  . Sleep apnea    does not use cpap   Family history- Reviewed and unchanged Social history- Reviewed and unchanged   Review of Systems:  Review of Systems   Constitutional: Negative for malaise/fatigue and weight loss.  HENT: Negative for hearing loss and tinnitus.   Eyes: Negative for blurred vision and double vision.  Respiratory: Negative for cough, shortness of breath and wheezing.   Cardiovascular: Negative for chest pain, palpitations, orthopnea, claudication and leg swelling.  Gastrointestinal: Negative for abdominal pain, blood in stool, constipation, diarrhea, heartburn, melena, nausea and vomiting.  Genitourinary: Negative.   Musculoskeletal: Negative for joint pain and myalgias.  Skin: Negative for rash.  Neurological: Negative for dizziness, tingling, sensory change, weakness and headaches.  Endo/Heme/Allergies: Negative for polydipsia.  Psychiatric/Behavioral: Negative.   All other systems reviewed and are negative.     Physical Exam: BP 136/74   Pulse 66   Temp (!) 97.5 F (36.4 C)   Ht 5\' 9"  (1.753 m)   Wt 189 lb (85.7 kg)   SpO2 96%   BMI 27.91 kg/m  Wt Readings from Last 3 Encounters:  02/05/18  189 lb (85.7 kg)  10/18/17 188 lb 6.4 oz (85.5 kg)  10/09/17 190 lb (86.2 kg)   General Appearance: Well nourished, in no apparent distress. Eyes: PERRLA, EOMs, conjunctiva no swelling or erythema Sinuses: No Frontal/maxillary tenderness ENT/Mouth: Ext aud canals clear, TMs without erythema, bulging. No erythema, swelling, or exudate on post pharynx.  Tonsils not swollen or erythematous. Hearing normal.  Neck: Supple, thyroid normal.  Respiratory: Respiratory effort normal, BS equal bilaterally without rales, rhonchi, wheezing or stridor.  Cardio: RRR with no MRGs. Brisk peripheral pulses without edema.  Abdomen: Soft, + BS.  Non tender, no guarding, rebound, hernias, masses. Lymphatics: Non tender without lymphadenopathy.  Musculoskeletal: Full ROM, 5/5 strength, Normal gait Skin: Warm, dry without rashes, lesions, ecchymosis.  Neuro: Cranial nerves intact. No cerebellar symptoms.  Psych: Awake and oriented X 3,  normal affect, Insight and Judgment appropriate.    Izora Ribas, NP 9:54 AM [facility]

## 2018-02-05 ENCOUNTER — Encounter: Payer: Self-pay | Admitting: Adult Health

## 2018-02-05 ENCOUNTER — Ambulatory Visit (INDEPENDENT_AMBULATORY_CARE_PROVIDER_SITE_OTHER): Payer: Medicare Other | Admitting: Adult Health

## 2018-02-05 VITALS — BP 136/74 | HR 66 | Temp 97.5°F | Ht 69.0 in | Wt 189.0 lb

## 2018-02-05 DIAGNOSIS — E785 Hyperlipidemia, unspecified: Secondary | ICD-10-CM

## 2018-02-05 DIAGNOSIS — I10 Essential (primary) hypertension: Secondary | ICD-10-CM

## 2018-02-05 DIAGNOSIS — K219 Gastro-esophageal reflux disease without esophagitis: Secondary | ICD-10-CM | POA: Diagnosis not present

## 2018-02-05 DIAGNOSIS — E1169 Type 2 diabetes mellitus with other specified complication: Secondary | ICD-10-CM | POA: Diagnosis not present

## 2018-02-05 DIAGNOSIS — Z79899 Other long term (current) drug therapy: Secondary | ICD-10-CM | POA: Diagnosis not present

## 2018-02-05 DIAGNOSIS — E039 Hypothyroidism, unspecified: Secondary | ICD-10-CM

## 2018-02-05 DIAGNOSIS — I25810 Atherosclerosis of coronary artery bypass graft(s) without angina pectoris: Secondary | ICD-10-CM

## 2018-02-05 DIAGNOSIS — I25708 Atherosclerosis of coronary artery bypass graft(s), unspecified, with other forms of angina pectoris: Secondary | ICD-10-CM

## 2018-02-05 DIAGNOSIS — E663 Overweight: Secondary | ICD-10-CM | POA: Diagnosis not present

## 2018-02-05 DIAGNOSIS — E559 Vitamin D deficiency, unspecified: Secondary | ICD-10-CM

## 2018-02-05 DIAGNOSIS — N182 Chronic kidney disease, stage 2 (mild): Secondary | ICD-10-CM

## 2018-02-05 DIAGNOSIS — E1122 Type 2 diabetes mellitus with diabetic chronic kidney disease: Secondary | ICD-10-CM

## 2018-02-05 NOTE — Patient Instructions (Signed)
Try restarting rosuvastatin (crestor) at 1/2 tab once a week and see how you do    Know what a healthy weight is for you (roughly BMI <25) and aim to maintain this  Aim for 7+ servings of fruits and vegetables daily  65-80+ fluid ounces of water or unsweet tea for healthy kidneys  Limit to max 1 drink of alcohol per day; avoid smoking/tobacco  Limit animal fats in diet for cholesterol and heart health - choose grass fed whenever available  Avoid highly processed foods, and foods high in saturated/trans fats  Aim for low stress - take time to unwind and care for your mental health  Aim for 150 min of moderate intensity exercise weekly for heart health, and weights twice weekly for bone health  Aim for 7-9 hours of sleep daily      When it comes to diets, agreement about the perfect plan isn't easy to find, even among the experts. Experts at the [employer] developed an idea known as the Healthy Eating Plate. Just imagine a plate divided into logical, healthy portions.  The emphasis is on diet quality:  Load up on vegetables and fruits - one-half of your plate: Aim for color and variety, and remember that potatoes don't count.  Go for whole grains - one-quarter of your plate: Whole wheat, barley, wheat berries, quinoa, oats, brown rice, and foods made with them. If you want pasta, go with whole wheat pasta.  Protein power - one-quarter of your plate: Fish, chicken, beans, and nuts are all healthy, versatile protein sources. Limit red meat.  The diet, however, does go beyond the plate, offering a few other suggestions.  Use healthy plant oils, such as olive, canola, soy, corn, sunflower and peanut. Check the labels, and avoid partially hydrogenated oil, which have unhealthy trans fats.  If you're thirsty, drink water. Coffee and tea are good in moderation, but skip sugary drinks and limit milk and dairy products to one or two daily servings.  The type of  carbohydrate in the diet is more important than the amount. Some sources of carbohydrates, such as vegetables, fruits, whole grains, and beans-are healthier than others.  Finally, stay active.

## 2018-02-06 ENCOUNTER — Other Ambulatory Visit: Payer: Self-pay | Admitting: Adult Health

## 2018-02-06 DIAGNOSIS — E875 Hyperkalemia: Secondary | ICD-10-CM

## 2018-02-06 LAB — CBC WITH DIFFERENTIAL/PLATELET
BASOS PCT: 0.3 %
Basophils Absolute: 17 cells/uL (ref 0–200)
Eosinophils Absolute: 151 cells/uL (ref 15–500)
Eosinophils Relative: 2.6 %
HEMATOCRIT: 41.6 % (ref 38.5–50.0)
Hemoglobin: 14.2 g/dL (ref 13.2–17.1)
LYMPHS ABS: 2146 {cells}/uL (ref 850–3900)
MCH: 34 pg — ABNORMAL HIGH (ref 27.0–33.0)
MCHC: 34.1 g/dL (ref 32.0–36.0)
MCV: 99.5 fL (ref 80.0–100.0)
MPV: 10.1 fL (ref 7.5–12.5)
Monocytes Relative: 8.7 %
Neutro Abs: 2981 cells/uL (ref 1500–7800)
Neutrophils Relative %: 51.4 %
PLATELETS: 203 10*3/uL (ref 140–400)
RBC: 4.18 10*6/uL — ABNORMAL LOW (ref 4.20–5.80)
RDW: 12.1 % (ref 11.0–15.0)
Total Lymphocyte: 37 %
WBC: 5.8 10*3/uL (ref 3.8–10.8)
WBCMIX: 505 {cells}/uL (ref 200–950)

## 2018-02-06 LAB — COMPLETE METABOLIC PANEL WITH GFR
AG Ratio: 1.7 (calc) (ref 1.0–2.5)
ALBUMIN MSPROF: 4.7 g/dL (ref 3.6–5.1)
ALKALINE PHOSPHATASE (APISO): 52 U/L (ref 40–115)
ALT: 27 U/L (ref 9–46)
AST: 20 U/L (ref 10–35)
BUN: 19 mg/dL (ref 7–25)
CO2: 30 mmol/L (ref 20–32)
CREATININE: 1.05 mg/dL (ref 0.70–1.18)
Calcium: 10 mg/dL (ref 8.6–10.3)
Chloride: 103 mmol/L (ref 98–110)
GFR, EST AFRICAN AMERICAN: 81 mL/min/{1.73_m2} (ref >=60)
GFR, EST NON AFRICAN AMERICAN: 70 mL/min/{1.73_m2} (ref >=60)
GLOBULIN: 2.7 g/dL (ref 1.9–3.7)
GLUCOSE: 123 mg/dL — AB (ref 65–99)
Potassium: 5.8 mmol/L — ABNORMAL HIGH (ref 3.5–5.3)
SODIUM: 142 mmol/L (ref 135–146)
TOTAL PROTEIN: 7.4 g/dL (ref 6.1–8.1)
Total Bilirubin: 0.5 mg/dL (ref 0.2–1.2)

## 2018-02-06 LAB — LIPID PANEL
Cholesterol: 178 mg/dL (ref <200)
HDL: 47 mg/dL (ref >40)
LDL Cholesterol (Calc): 106 mg/dL (calc) — ABNORMAL HIGH
NON-HDL CHOLESTEROL (CALC): 131 mg/dL — AB (ref <130)
Total CHOL/HDL Ratio: 3.8 (calc) (ref <5.0)
Triglycerides: 132 mg/dL (ref <150)

## 2018-02-06 LAB — HEMOGLOBIN A1C
EAG (MMOL/L): 8.4 (calc)
HEMOGLOBIN A1C: 6.9 %{Hb} — AB (ref <5.7)
Mean Plasma Glucose: 151 (calc)

## 2018-02-06 LAB — MAGNESIUM: MAGNESIUM: 2.2 mg/dL (ref 1.5–2.5)

## 2018-02-06 LAB — TSH: TSH: 2.33 mIU/L (ref 0.40–4.50)

## 2018-02-14 ENCOUNTER — Ambulatory Visit (INDEPENDENT_AMBULATORY_CARE_PROVIDER_SITE_OTHER): Payer: Medicare Other

## 2018-02-14 DIAGNOSIS — E875 Hyperkalemia: Secondary | ICD-10-CM

## 2018-02-14 NOTE — Progress Notes (Signed)
Pt reports for BMP w/ GFR labs are in Epic per provider ([id number]). Pt's concerns were answered & pt was directed to the lab.

## 2018-02-15 LAB — BASIC METABOLIC PANEL WITH GFR
BUN: 17 mg/dL (ref 7–25)
CHLORIDE: 104 mmol/L (ref 98–110)
CO2: 29 mmol/L (ref 20–32)
Calcium: 9.6 mg/dL (ref 8.6–10.3)
Creat: 0.94 mg/dL (ref 0.70–1.18)
GFR, Est African American: 92 mL/min/{1.73_m2} (ref >=60)
GFR, Est Non African American: 80 mL/min/{1.73_m2} (ref >=60)
GLUCOSE: 129 mg/dL — AB (ref 65–99)
Potassium: 5.2 mmol/L (ref 3.5–5.3)
Sodium: 142 mmol/L (ref 135–146)

## 2018-03-08 ENCOUNTER — Encounter: Payer: Self-pay | Admitting: Internal Medicine

## 2018-03-26 DIAGNOSIS — Z23 Encounter for immunization: Secondary | ICD-10-CM | POA: Diagnosis not present

## 2018-04-22 ENCOUNTER — Other Ambulatory Visit: Payer: Self-pay | Admitting: Adult Health

## 2018-04-22 DIAGNOSIS — E782 Mixed hyperlipidemia: Secondary | ICD-10-CM

## 2018-05-03 DIAGNOSIS — E119 Type 2 diabetes mellitus without complications: Secondary | ICD-10-CM | POA: Diagnosis not present

## 2018-05-03 DIAGNOSIS — Z961 Presence of intraocular lens: Secondary | ICD-10-CM | POA: Diagnosis not present

## 2018-05-03 LAB — HM DIABETES EYE EXAM

## 2018-05-08 ENCOUNTER — Encounter: Payer: Self-pay | Admitting: *Deleted

## 2018-05-14 ENCOUNTER — Encounter: Payer: Self-pay | Admitting: Internal Medicine

## 2018-05-14 DIAGNOSIS — Z87891 Personal history of nicotine dependence: Secondary | ICD-10-CM | POA: Insufficient documentation

## 2018-05-14 NOTE — Progress Notes (Signed)
This very nice 72 y.o. MWM presents for 6 month follow up with HTN, ASCAD/CABG, [name], Hypothyroidism,  Pre-Diabetes and Vitamin D Deficiency. Patient's GERD is controlled with prudent diet.      Patient is treated for HTN & BP has been controlled at home. Today's BP is at goal - 130/66.  In 2001, patient had PCA/Stent for ACS and then in 2008 underwent CABG. He had negative Cardiolyte x 2 in 2013 and 2014 and then in 2017 has a negative ETT by Dr Sallyanne Kuster.  Patient has had no complaints of any cardiac type chest pain, palpitations, dyspnea / orthopnea / PND, dizziness, claudication, or dependent edema.     Patient has Statin Intolerance (takes Crestor 1 x /week) & his Hyperlipidemia is not controlled with diet & Zetia. Patient denies myalgias or other med SE's. Last Lipids were near goal with an elevated LDL of 106. Lab Results  Component Value Date   CHOL 178 02/05/2018   HDL 47 02/05/2018   LDLCALC 106 (H) 02/05/2018   TRIG 132 02/05/2018   CHOLHDL 3.8 02/05/2018      Also, the patient has history of [name] (2000) w /CKD2 and has had no symptoms of reactive hypoglycemia, diabetic polys, paresthesias or visual blurring.  Last A1c was not at goal: Lab Results  Component Value Date   HGBA1C 6.9 (H) 02/05/2018      Patient has been on Thyroid Replacement circa 2013.     Further, the patient also has history of Vitamin D Deficiency  ("33" in 2008) and supplements vitamin D without any suspected side-effects. Last vitamin D was at goal: Lab Results  Component Value Date   VD25OH 9 10/18/2017   Current Outpatient Medications on File Prior to Visit  Medication Sig  . acyclovir (ZOVIRAX) 800 MG tablet Take 1 tablet (800 mg total) by mouth 3 (three) times daily as needed (fever blister).  Marland Kitchen aspirin 325 MG tablet Take 325 mg by mouth at bedtime.   Marland Kitchen atenolol (TENORMIN) 50 MG tablet TAKE 1 TABLET BY MOUTH EVERY DAY  . Cholecalciferol 5000 UNITS TABS Take 1 tablet by mouth daily.  Marland Kitchen  ezetimibe (ZETIA) 10 MG tablet TAKE 1 TABLET BY MOUTH EVERY DAY  . IRON CR PO Take 1 tablet by mouth daily.  Marland Kitchen levothyroxine (SYNTHROID, LEVOTHROID) 50 MCG tablet TAKE 1 TABLET EVERY MORNING ON AN EMPTY STOMACH WITH ONLY WATER FOR 30 MINUTES  . losartan (COZAAR) 50 MG tablet TAKE 1/2 TO 1 TABLET BY MOUTH EVERY DAY FOR BLOOD PRESSURE  . meclizine (ANTIVERT) 25 MG tablet 1/2-1 pill up to 3 times daily for motion sickness/dizziness (Patient taking differently: 1/2-1 pill up to 3 times as needed for motion sickness/dizziness)  . metFORMIN (GLUCOPHAGE XR) 500 MG 24 hr tablet Take 2 tablets 2 x / day with food for Diabetes  . Multiple Vitamin (MULTIVITAMIN) tablet Take 1 tablet by mouth at bedtime.   . Omega-3 Fatty Acids (FISH OIL) 1000 MG CAPS Take 1,000 mg by mouth at bedtime.   Marland Kitchen omeprazole (PRILOSEC) 20 MG capsule Take 20 mg by mouth as needed. OTC about 3 times a week.  . rosuvastatin (CRESTOR) 10 MG tablet Take 10 mg by mouth once a week.   No current facility-administered medications on file prior to visit.    Allergies  Allergen Reactions  . Gemfibrozil Other (See Comments)    Muscle aches  . Ace Inhibitors Cough  . Lipitor [Atorvastatin] Other (See Comments)  Joint pain.   PMHx:   Past Medical History:  Diagnosis Date  . Anemia    low iron  . Arthritis   . Cancer ([place])    skin cancer on face  . Cataracts, bilateral   . Coronary artery disease    CABG - 2008  . Diabetes mellitus without complication ([place])    type II  . GERD (gastroesophageal reflux disease)   . Hepatitis   . Hyperlipidemia   . Hypertension    pt states he does not have HTN, takes Atenolol for migraines  . Hypothyroidism   . Migraine headache with aura    takes Atenolol  . Sleep apnea    does not use cpap   Immunization History  Administered Date(s) Administered  . DT 08/29/2003, 06/04/2015  . Influenza-Unspecified 04/16/2014, 03/30/2016, 04/02/2017  . Pneumococcal Conjugate-13 12/26/2013  .  Pneumococcal Polysaccharide-23 01/29/2010  . Zoster 05/11/2010  . Zoster Recombinat (Shingrix) 01/31/2017, 07/05/2017   Past Surgical History:  Procedure Laterality Date  . BACK SURGERY    . CARDIAC SURGERY     Bypass  . CATARACT EXTRACTION, BILATERAL Bilateral 2018   Dr. Gershon Crane  . COLONOSCOPY    . CORONARY ANGIOPLASTY  2001  . CORONARY ARTERY BYPASS GRAFT    . ELBOW SURGERY Left    due to damaged nerve  . KNEE SURGERY Right    arthroscopic  . LUMBAR LAMINECTOMY/DECOMPRESSION MICRODISCECTOMY Right 08/04/2014   Procedure: Right Lumbar four-five Microdiskectomy;  Surgeon: Kristeen Miss, MD;  Location: [facility];  Service: Neurosurgery;  Laterality: Right;  Right L4-5 Microdiskectomy  . SURGERY SCROTAL / TESTICULAR     as a child/teenager   FHx:    Reviewed / unchanged  SHx:    Reviewed / unchanged   Systems Review:  Constitutional: Denies fever, chills, wt changes, headaches, insomnia, fatigue, night sweats, change in appetite. Eyes: Denies redness, blurred vision, diplopia, discharge, itchy, watery eyes.  ENT: Denies discharge, congestion, post nasal drip, epistaxis, sore throat, earache, hearing loss, dental pain, tinnitus, vertigo, sinus pain, snoring.  CV: Denies chest pain, palpitations, irregular heartbeat, syncope, dyspnea, diaphoresis, orthopnea, PND, claudication or edema. Respiratory: denies cough, dyspnea, DOE, pleurisy, hoarseness, laryngitis, wheezing.  Gastrointestinal: Denies dysphagia, odynophagia, heartburn, reflux, water brash, abdominal pain or cramps, nausea, vomiting, bloating, diarrhea, constipation, hematemesis, melena, hematochezia  or hemorrhoids. Genitourinary: Denies dysuria, frequency, urgency, nocturia, hesitancy, discharge, hematuria or flank pain. Musculoskeletal: Denies arthralgias, myalgias, stiffness, jt. swelling, pain, limping or strain/sprain.  Skin: Denies pruritus, rash, hives, warts, acne, eczema or change in skin lesion(s). Neuro: No  weakness, tremor, incoordination, spasms, paresthesia or pain. Psychiatric: Denies confusion, memory loss or sensory loss. Endo: Denies change in weight, skin or hair change.  Heme/Lymph: No excessive bleeding, bruising or enlarged lymph nodes.  Physical Exam  BP 130/66   Pulse 67   Temp (!) 97.3 F (36.3 C)   Ht 5\' 9"  (1.753 m)   Wt 190 lb (86.2 kg)   SpO2 97%   BMI 28.06 kg/m   Appears  well nourished, well groomed  and in no distress.  Eyes: PERRLA, EOMs, conjunctiva no swelling or erythema. Sinuses: No frontal/maxillary tenderness ENT/Mouth: EAC's clear, TM's nl w/o erythema, bulging. Nares clear w/o erythema, swelling, exudates. Oropharynx clear without erythema or exudates. Oral hygiene is good. Tongue normal, non obstructing. Hearing intact.  Neck: Supple. Thyroid not palpable. Car 2+/2+ without bruits, nodes or JVD. Chest: Respirations nl with BS clear & equal w/o rales, rhonchi, wheezing or stridor.  Cor: Heart sounds normal w/ regular rate and rhythm without sig. murmurs, gallops, clicks or rubs. Peripheral pulses normal and equal  without edema.  Abdomen: Soft & bowel sounds normal. Non-tender w/o guarding, rebound, hernias, masses or organomegaly.  Lymphatics: Unremarkable.  Musculoskeletal: Full ROM all peripheral extremities, joint stability, 5/5 strength and normal gait.  Skin: Warm, dry without exposed rashes, lesions or ecchymosis apparent.  Neuro: Cranial nerves intact, reflexes equal bilaterally. Sensory-motor testing grossly intact. Tendon reflexes grossly intact.  Pysch: Alert & oriented x 3.  Insight and judgement nl & appropriate. No ideations.  Assessment and Plan:  1. Essential hypertension  - Continue medication, monitor blood pressure at home.  - Continue DASH diet.  Reminder to go to the ER if any CP,  SOB, nausea, dizziness, severe HA, changes vision/speech.  - CBC with Differential/Platelet - COMPLETE METABOLIC PANEL WITH GFR - Magnesium -  TSH  2. Hyperlipidemia, mixed  - Advised Increase Crestor from 1 x /week to 2 x /week for 1 month and then try to increase to 3 x /week   - Continue diet, exercise,& lifestyle modifications.  - Continue monitor periodic cholesterol/liver & renal functions   - Lipid panel - TSH  3. Type 2 diabetes mellitus with stage 2 chronic kidney disease, without long-term current use of insulin (HCC)  - Continue diet, exercise,  - lifestyle modifications.  - Monitor appropriate labs.  - Hemoglobin A1c - Insulin, random  4. Vitamin D deficiency  - Continue supplementation.  - VITAMIN D 25 Hydroxyl  5. Coronary artery disease involving coronary bypass graft of native heart with other forms of angina pectoris (HCC)  - Lipid panel  6. Hypothyroidism  - TSH  7. Gastroesophageal reflux disease  - CBC with Differential/Platelet  8. Medication management  - CBC with Differential/Platelet - COMPLETE METABOLIC PANEL WITH GFR - Magnesium - Lipid panel - TSH - Hemoglobin A1c - Insulin, random - VITAMIN D 25 Hydroxyl       Discussed  regular exercise, BP monitoring, weight control to achieve/maintain BMI less than 25 and discussed med and SE's. Recommended labs to assess and monitor clinical status with further disposition pending results of labs. Over 30 minutes of exam, counseling, chart review was performed.

## 2018-05-14 NOTE — Patient Instructions (Signed)

## 2018-05-15 ENCOUNTER — Encounter: Payer: Self-pay | Admitting: Internal Medicine

## 2018-05-15 ENCOUNTER — Ambulatory Visit (INDEPENDENT_AMBULATORY_CARE_PROVIDER_SITE_OTHER): Payer: Medicare Other | Admitting: Internal Medicine

## 2018-05-15 VITALS — BP 130/66 | HR 67 | Temp 97.3°F | Ht 69.0 in | Wt 190.0 lb

## 2018-05-15 DIAGNOSIS — N182 Chronic kidney disease, stage 2 (mild): Secondary | ICD-10-CM

## 2018-05-15 DIAGNOSIS — I25708 Atherosclerosis of coronary artery bypass graft(s), unspecified, with other forms of angina pectoris: Secondary | ICD-10-CM

## 2018-05-15 DIAGNOSIS — I25810 Atherosclerosis of coronary artery bypass graft(s) without angina pectoris: Secondary | ICD-10-CM | POA: Diagnosis not present

## 2018-05-15 DIAGNOSIS — E559 Vitamin D deficiency, unspecified: Secondary | ICD-10-CM | POA: Diagnosis not present

## 2018-05-15 DIAGNOSIS — E782 Mixed hyperlipidemia: Secondary | ICD-10-CM | POA: Diagnosis not present

## 2018-05-15 DIAGNOSIS — Z79899 Other long term (current) drug therapy: Secondary | ICD-10-CM

## 2018-05-15 DIAGNOSIS — I10 Essential (primary) hypertension: Secondary | ICD-10-CM

## 2018-05-15 DIAGNOSIS — K219 Gastro-esophageal reflux disease without esophagitis: Secondary | ICD-10-CM

## 2018-05-15 DIAGNOSIS — E1122 Type 2 diabetes mellitus with diabetic chronic kidney disease: Secondary | ICD-10-CM

## 2018-05-15 DIAGNOSIS — E039 Hypothyroidism, unspecified: Secondary | ICD-10-CM | POA: Diagnosis not present

## 2018-05-16 LAB — CBC WITH DIFFERENTIAL/PLATELET
Basophils Absolute: 19 cells/uL (ref 0–200)
Basophils Relative: 0.5 %
Eosinophils Absolute: 171 cells/uL (ref 15–500)
Eosinophils Relative: 4.5 %
HEMATOCRIT: 38.3 % — AB (ref 38.5–50.0)
HEMOGLOBIN: 13.1 g/dL — AB (ref 13.2–17.1)
LYMPHS ABS: 1604 {cells}/uL (ref 850–3900)
MCH: 34.7 pg — ABNORMAL HIGH (ref 27.0–33.0)
MCHC: 34.2 g/dL (ref 32.0–36.0)
MCV: 101.3 fL — ABNORMAL HIGH (ref 80.0–100.0)
MPV: 9.8 fL (ref 7.5–12.5)
Monocytes Relative: 10.6 %
NEUTROS ABS: 1604 {cells}/uL (ref 1500–7800)
Neutrophils Relative %: 42.2 %
Platelets: 167 10*3/uL (ref 140–400)
RBC: 3.78 10*6/uL — AB (ref 4.20–5.80)
RDW: 11.9 % (ref 11.0–15.0)
Total Lymphocyte: 42.2 %
WBC mixed population: 403 cells/uL (ref 200–950)
WBC: 3.8 10*3/uL (ref 3.8–10.8)

## 2018-05-16 LAB — HEMOGLOBIN A1C
EAG (MMOL/L): 7.9 (calc)
HEMOGLOBIN A1C: 6.6 %{Hb} — AB (ref <5.7)
MEAN PLASMA GLUCOSE: 143 (calc)

## 2018-05-16 LAB — LIPID PANEL
CHOL/HDL RATIO: 3.8 (calc) (ref <5.0)
CHOLESTEROL: 177 mg/dL (ref <200)
HDL: 46 mg/dL (ref >40)
LDL CHOLESTEROL (CALC): 109 mg/dL — AB
Non-HDL Cholesterol (Calc): 131 mg/dL (calc) — ABNORMAL HIGH (ref <130)
TRIGLYCERIDES: 117 mg/dL (ref <150)

## 2018-05-16 LAB — COMPLETE METABOLIC PANEL WITH GFR
AG Ratio: 2 (calc) (ref 1.0–2.5)
ALT: 26 U/L (ref 9–46)
AST: 21 U/L (ref 10–35)
Albumin: 4.4 g/dL (ref 3.6–5.1)
Alkaline phosphatase (APISO): 46 U/L (ref 40–115)
BUN: 18 mg/dL (ref 7–25)
CALCIUM: 10 mg/dL (ref 8.6–10.3)
CO2: 29 mmol/L (ref 20–32)
CREATININE: 1 mg/dL (ref 0.70–1.18)
Chloride: 106 mmol/L (ref 98–110)
GFR, EST NON AFRICAN AMERICAN: 74 mL/min/{1.73_m2} (ref >=60)
GFR, Est African American: 86 mL/min/{1.73_m2} (ref >=60)
GLOBULIN: 2.2 g/dL (ref 1.9–3.7)
GLUCOSE: 160 mg/dL — AB (ref 65–99)
Potassium: 5 mmol/L (ref 3.5–5.3)
Sodium: 144 mmol/L (ref 135–146)
Total Bilirubin: 0.6 mg/dL (ref 0.2–1.2)
Total Protein: 6.6 g/dL (ref 6.1–8.1)

## 2018-05-16 LAB — MAGNESIUM: Magnesium: 1.8 mg/dL (ref 1.5–2.5)

## 2018-05-16 LAB — TSH: TSH: 1.87 mIU/L (ref 0.40–4.50)

## 2018-05-16 LAB — VITAMIN D 25 HYDROXY (VIT D DEFICIENCY, FRACTURES): VIT D 25 HYDROXY: 77 ng/mL (ref 30–100)

## 2018-05-16 LAB — INSULIN, RANDOM: Insulin: 37.9 u[IU]/mL — ABNORMAL HIGH (ref 2.0–19.6)

## 2018-05-27 ENCOUNTER — Other Ambulatory Visit: Payer: Self-pay | Admitting: Internal Medicine

## 2018-05-27 MED ORDER — METFORMIN HCL ER 500 MG PO TB24: ORAL_TABLET | ORAL | 3 refills | Status: DC

## 2018-07-04 ENCOUNTER — Other Ambulatory Visit: Payer: Self-pay | Admitting: Internal Medicine

## 2018-07-13 ENCOUNTER — Other Ambulatory Visit: Payer: Self-pay | Admitting: Internal Medicine

## 2018-07-30 ENCOUNTER — Other Ambulatory Visit: Payer: Self-pay | Admitting: Internal Medicine

## 2018-07-30 MED ORDER — ACYCLOVIR 800 MG PO TABS: ORAL_TABLET | ORAL | 0 refills | Status: DC

## 2018-08-23 ENCOUNTER — Ambulatory Visit: Payer: Self-pay | Admitting: Adult Health

## 2018-08-28 NOTE — Progress Notes (Signed)
MEDICARE ANNUAL WELLNESS VISIT AND FOLLOW UP Assessment:   Diagnoses and all orders for this visit:  Encounter for Medicare annual wellness exam  Essential hypertension Well controlled; continue medication Monitor blood pressure at home; call if consistently over 130/80 Continue DASH diet.   Reminder to go to the ER if any CP, SOB, nausea, dizziness, severe HA, changes vision/speech, left arm numbness and tingling and jaw pain.  Coronary artery disease involving coronary bypass graft of native heart, Other angina ([place]) very infrequent CCS class II exertional angina pectoris; continue BB; followed by cardiology (Dr. Sallyanne Kuster) Continue ASA, close management of cholesterol   Atherosclerosis of abdominal aorta ([place]) Control blood pressure, cholesterol, glucose, increase exercise.   Gastroesophageal reflux disease, esophagitis presence not specified Well managed with PRN OTC medications Discussed diet, avoiding triggers and other lifestyle changes  Irritable bowel syndrome, unspecified type Avoid trigger foods; currently stable with lifestyle modification only  Type 2 diabetes mellitus with stage 2 chronic kidney disease, without long-term current use of insulin (HCC) Continue metformin Continue diet and exercise.  Perform daily foot/skin check, notify office of any concerning changes.  -     Hemoglobin A1c  Hypothyroidism, unspecified type Continue synthroid reminded to take on an empty stomach 30-61mins before food.  -     TSH  Mixed hyperlipidemia Intolerant of high dose statinstatins; treated by zetia, low dose rosuvastatin twice weekly (tolerating well, plan to increase to 3 times weekly if needed), omega 3 Continue low cholesterol diet and exercise.  -     Lipid panel  Vitamin D deficiency Continue supplementation At goal at last visit; defer checking level  Medication management -     CBC with Differential/Platelet -     CMP/GFR -     Magnesium  Former  smoker Remote; quit in 1975; not candidate for LDCT Last CXR 2017  Over 30 minutes of exam, counseling, chart review, and critical decision making was performed  Future Appointments  Date Time Provider [place]  11/02/2018  8:40 AM Croitoru, Dani Gobble, MD [facility]  11/23/2018 11:00 AM Unk Pinto, MD [facility] None     Plan:   During the course of the visit the patient was educated and counseled about appropriate screening and preventive services including:    Pneumococcal vaccine   Influenza vaccine  Prevnar 13  Td vaccine  Screening electrocardiogram  Colorectal cancer screening  Diabetes screening  Glaucoma screening  Nutrition counseling    Subjective:  Joshua Higgins is a 73 y.o. male who presents for Medicare Annual Wellness Visit and 3 month follow up for HTN, hyperlipidemia, prediabetes, hypothyroid and vitamin D Def. Patient has ASCAD with stenting in 2001, CABG 2008 with very infrequent CCS class II exertional angina pectoris and followed by Dr. Sallyanne Kuster.   he has a diagnosis of GERD which is currently managed by omeprazole 20 mg PRN (~3 times per week) he reports symptoms is currently well controlled, and denies breakthrough reflux, burning in chest, hoarseness or cough.    BMI is Body mass index is 27.76 kg/m., he has been working on diet and exercise, has been very active around house and yard, cutting down trees.  Wt Readings from Last 3 Encounters:  08/29/18 188 lb (85.3 kg)  05/15/18 190 lb (86.2 kg)  02/14/18 189 lb (85.7 kg)    His blood pressure has been controlled at home, today their BP is BP: 138/64 He does workout. He denies chest pain, shortness of breath, dizziness.   He is  on cholesterol medication (zetia 10 mg daily, rosuvastatin 20 mg twice weekly) and denies myalgias (he stopped taking gemfibrozil due to muscle aches, does not tolerate high dose statins). His cholesterol is not at goal. The cholesterol last visit  was:   Lab Results  Component Value Date   CHOL 177 05/15/2018   HDL 46 05/15/2018   LDLCALC 109 (H) 05/15/2018   TRIG 117 05/15/2018   CHOLHDL 3.8 05/15/2018   He has been working on diet and exercise for T2 diabetes (treated by metformin 1000 mg BID), he is on ASA, Statin, ARB and denies increased appetite, nausea, polydipsia, polyuria, visual disturbances, vomiting and weight loss. He does check fasting glucose in the AM, typically 120-130s. Last A1C in the office was:  Lab Results  Component Value Date   HGBA1C 6.6 (H) 05/15/2018   Last GFR Lab Results  Component Value Date   GFRNONAA 74 05/15/2018   He is on thyroid medication. His medication was not changed last visit.   Lab Results  Component Value Date   TSH 1.87 05/15/2018    Patient is on Vitamin D supplement and at goal at last check:   Lab Results  Component Value Date   VD25OH 77 05/15/2018      Medication Review: Current Outpatient Medications on File Prior to Visit  Medication Sig Dispense Refill  . acyclovir (ZOVIRAX) 800 MG tablet Take 1 tablet daily for Fever Blister / Mouth Ulcers 90 tablet 0  . aspirin 325 MG tablet Take 325 mg by mouth at bedtime.     . Cholecalciferol 5000 UNITS TABS Take 1 tablet by mouth daily.    Marland Kitchen ezetimibe (ZETIA) 10 MG tablet TAKE 1 TABLET BY MOUTH EVERY DAY 90 tablet 2  . IRON CR PO Take 1 tablet by mouth daily.    Marland Kitchen levothyroxine (SYNTHROID, LEVOTHROID) 50 MCG tablet TAKE 1 TABLET EVERY MORNING ON AN EMPTY STOMACH WITH ONLY WATER FOR 30 MINUTES 90 tablet 3  . losartan (COZAAR) 25 MG tablet Take 1 tablet 2 x /day for BP 180 tablet 3  . meclizine (ANTIVERT) 25 MG tablet 1/2-1 pill up to 3 times daily for motion sickness/dizziness (Patient taking differently: 1/2-1 pill up to 3 times as needed for motion sickness/dizziness) 30 tablet 2  . metFORMIN (GLUCOPHAGE XR) 500 MG 24 hr tablet Take 2 tablets 2 x / day with food for Diabetes 360 tablet 3  . Multiple Vitamin (MULTIVITAMIN)  tablet Take 1 tablet by mouth at bedtime.     . Omega-3 Fatty Acids (FISH OIL) 1000 MG CAPS Take 1,000 mg by mouth at bedtime.     Marland Kitchen omeprazole (PRILOSEC) 20 MG capsule Take 20 mg by mouth as needed. OTC about 3 times a week.     No current facility-administered medications on file prior to visit.     Allergies: Allergies  Allergen Reactions  . Gemfibrozil Other (See Comments)    Muscle aches  . Ace Inhibitors Cough  . Lipitor [Atorvastatin] Other (See Comments)    Joint pain.    Current Problems (verified) has [name] w/Stage 2 CKD (GFR 73 ml/min); Hyperlipidemia associated with type 2 diabetes mellitus ([place]); Hypertension; GERD (gastroesophageal reflux disease); IBS (irritable bowel syndrome); Vitamin D deficiency; Medication management; CAD s/p CABG; Hypothyroidism; Atherosclerosis of abdominal aorta ([place]); CKD stage 2 due to type 2 diabetes mellitus ([place]); Overweight (BMI 25.0-29.9); and Former smoker on their problem list.  Screening Tests Immunization History  Administered Date(s) Administered  . DT 08/29/2003, 06/04/2015  .  Influenza, High Dose Seasonal PF 04/03/2018  . Influenza-Unspecified 04/16/2014, 03/30/2016, 04/02/2017  . Pneumococcal Conjugate-13 12/26/2013  . Pneumococcal Polysaccharide-23 01/29/2010  . Zoster 05/11/2010  . Zoster Recombinat (Shingrix) 01/31/2017, 07/05/2017   Preventative care: Last colonoscopy: 2005 Cologuard: 2018  Prior vaccinations: TD or Tdap: 2016  Influenza: 2019 Pneumococcal: 2011 Prevnar13: 2015 Shingles/Zostavax: 2011, 2019 2/2  Names of Other Physician/Practitioners you currently use: 1. [facility] here for primary care 2. Dr. Gershon Crane, eye doctor, last visit  - diabetic eye exam 05/03/2018 - report received 3. Dr. Isac Caddy, dentist, last visit  2019, goes q58m  Patient Care Team: Unk Pinto, MD as PCP - General (Internal Medicine) Rutherford Guys, MD as Consulting Physician  (Ophthalmology) Kristeen Miss, MD as Consulting Physician (Neurosurgery) Sanda Klein, MD as Consulting Physician (Cardiology) Teena Irani, MD (Inactive) as Consulting Physician (Gastroenterology)  Surgical: He  has a past surgical history that includes Cardiac surgery; Knee surgery (Right); Back surgery; Coronary angioplasty (2001); Coronary artery bypass graft; Surgery scrotal / testicular; Elbow surgery (Left); Colonoscopy; Lumbar laminectomy/decompression microdiscectomy (Right, 08/04/2014); and Cataract extraction, bilateral (Bilateral, 2018). Family His family history includes Arthritis in his mother; CAD in his brother and sister; Diabetes in his maternal grandmother; Heart attack in his brother and father; Hypertension in his brother; Ulcers in his mother. Social history  He reports that he quit smoking about 44 years ago. He has never used smokeless tobacco. He reports that he does not drink alcohol or use drugs.  MEDICARE WELLNESS OBJECTIVES: Physical activity: Current Exercise Habits: Home exercise routine, Type of exercise: walking, Time (Minutes): 30, Frequency (Times/Week): 3, Weekly Exercise (Minutes/Week): 90, Intensity: Mild, Exercise limited by: None identified Cardiac risk factors: Cardiac Risk Factors include: advanced age (>77men, >21 women);dyslipidemia;male gender;hypertension;sedentary lifestyle;smoking/ tobacco exposure Depression/mood screen:   Depression screen [facility] 2/9 08/29/2018  Decreased Interest 0  Down, Depressed, Hopeless 0  PHQ - 2 Score 0    ADLs:  In your present state of health, do you have any difficulty performing the following activities: 08/29/2018 05/15/2018  Hearing? N N  Vision? N N  Difficulty concentrating or making decisions? N N  Walking or climbing stairs? N N  Dressing or bathing? N N  Doing errands, shopping? N N  Some recent data might be hidden     Cognitive Testing  Alert? Yes  Normal Appearance?Yes  Oriented to person? Yes   Place? Yes   Time? Yes  Recall of three objects?  Yes  Can perform simple calculations? Yes  Displays appropriate judgment?Yes  Can read the correct time from a watch face?Yes  EOL planning: Does Patient Have a Medical Advance Directive?: Yes Type of Advance Directive: Healthcare Power of Attorney, Living will Does patient want to make changes to medical advance directive?: No - Patient declined Copy of [place] in Chart?: No - copy requested   Objective:   Today's Vitals   08/29/18 0953  BP: 138/64  Pulse: 67  Temp: (!) 97.2 F (36.2 C)  SpO2: 98%  Weight: 188 lb (85.3 kg)  Height: 5\' 9"  (1.753 m)   Body mass index is 27.76 kg/m.  General appearance: alert, no distress, WD/WN, male HEENT: normocephalic, sclerae anicteric, TMs pearly, nares patent, no discharge or erythema, pharynx normal Oral cavity: MMM, no lesions Neck: supple, no lymphadenopathy, no thyromegaly, no masses Heart: RRR, normal S1, S2, no murmurs Lungs: CTA bilaterally, no wheezes, rhonchi, or rales Abdomen: +bs, soft, non tender, non distended, no masses, no  hepatomegaly, no splenomegaly Musculoskeletal: nontender, no swelling, no obvious deformity Extremities: no edema, no cyanosis, no clubbing Pulses: 2+ symmetric, upper 1+ diminished bilateral lower extremities, normal cap refill Neurological: alert, oriented x 3, CN2-12 intact, strength normal upper extremities and lower extremities, sensation normal throughout, DTRs 2+ throughout, no cerebellar signs, gait normal Psychiatric: normal affect, behavior normal, pleasant  PHQ-2: 0  Medicare Attestation I have personally reviewed: The patient's medical and social history Their use of alcohol, tobacco or illicit drugs Their current medications and supplements The patient's functional ability including ADLs,fall risks, home safety risks, cognitive, and hearing and visual impairment Diet and physical activities Evidence for depression  or mood disorders  The patient's weight, height, BMI, and visual acuity have been recorded in the chart.  I have made referrals, counseling, and provided education to the patient based on review of the above and I have provided the patient with a written personalized care plan for preventive services.     Izora Ribas, NP   08/29/2018

## 2018-08-29 ENCOUNTER — Encounter: Payer: Self-pay | Admitting: Adult Health

## 2018-08-29 ENCOUNTER — Ambulatory Visit (INDEPENDENT_AMBULATORY_CARE_PROVIDER_SITE_OTHER): Payer: Medicare Other | Admitting: Adult Health

## 2018-08-29 VITALS — BP 138/64 | HR 67 | Temp 97.2°F | Ht 69.0 in | Wt 188.0 lb

## 2018-08-29 DIAGNOSIS — I10 Essential (primary) hypertension: Secondary | ICD-10-CM

## 2018-08-29 DIAGNOSIS — I25708 Atherosclerosis of coronary artery bypass graft(s), unspecified, with other forms of angina pectoris: Secondary | ICD-10-CM | POA: Diagnosis not present

## 2018-08-29 DIAGNOSIS — E785 Hyperlipidemia, unspecified: Secondary | ICD-10-CM

## 2018-08-29 DIAGNOSIS — E663 Overweight: Secondary | ICD-10-CM | POA: Diagnosis not present

## 2018-08-29 DIAGNOSIS — K589 Irritable bowel syndrome without diarrhea: Secondary | ICD-10-CM | POA: Diagnosis not present

## 2018-08-29 DIAGNOSIS — R6889 Other general symptoms and signs: Secondary | ICD-10-CM

## 2018-08-29 DIAGNOSIS — Z0001 Encounter for general adult medical examination with abnormal findings: Secondary | ICD-10-CM | POA: Diagnosis not present

## 2018-08-29 DIAGNOSIS — I700 Atherosclerosis of aorta: Secondary | ICD-10-CM | POA: Diagnosis not present

## 2018-08-29 DIAGNOSIS — K219 Gastro-esophageal reflux disease without esophagitis: Secondary | ICD-10-CM

## 2018-08-29 DIAGNOSIS — Z87891 Personal history of nicotine dependence: Secondary | ICD-10-CM | POA: Diagnosis not present

## 2018-08-29 DIAGNOSIS — E1169 Type 2 diabetes mellitus with other specified complication: Secondary | ICD-10-CM | POA: Diagnosis not present

## 2018-08-29 DIAGNOSIS — E559 Vitamin D deficiency, unspecified: Secondary | ICD-10-CM

## 2018-08-29 DIAGNOSIS — E1122 Type 2 diabetes mellitus with diabetic chronic kidney disease: Secondary | ICD-10-CM | POA: Diagnosis not present

## 2018-08-29 DIAGNOSIS — Z79899 Other long term (current) drug therapy: Secondary | ICD-10-CM | POA: Diagnosis not present

## 2018-08-29 DIAGNOSIS — N182 Chronic kidney disease, stage 2 (mild): Secondary | ICD-10-CM | POA: Diagnosis not present

## 2018-08-29 DIAGNOSIS — E039 Hypothyroidism, unspecified: Secondary | ICD-10-CM | POA: Diagnosis not present

## 2018-08-29 DIAGNOSIS — Z0000 Encounter for general adult medical examination without abnormal findings: Secondary | ICD-10-CM

## 2018-08-29 MED ORDER — ROSUVASTATIN CALCIUM 10 MG PO TABS: ORAL_TABLET | ORAL | 1 refills | Status: DC

## 2018-08-29 MED ORDER — ATENOLOL 50 MG PO TABS: 50.0000 mg | ORAL_TABLET | Freq: Every day | ORAL | 1 refills | Status: DC

## 2018-08-29 MED ORDER — ROSUVASTATIN CALCIUM 20 MG PO TABS: ORAL_TABLET | ORAL | 1 refills | Status: DC

## 2018-08-29 NOTE — Patient Instructions (Addendum)
  Joshua Higgins , Thank you for taking time to come for your Medicare Wellness Visit. I appreciate your ongoing commitment to your health goals. Please review the following plan we discussed and let me know if I can assist you in the future.   These are the goals we discussed: Goals    . Exercise 150 min/wk Moderate Activity    . LDL CALC < 100       This is a list of the screening recommended for you and due dates:  Health Maintenance  Topic Date Due  .  Hepatitis C: One time screening is recommended by Center for Disease Control  (CDC) for  adults born from [birth year] through 1967.   May 22, 1946  . Flu Shot  02/01/2018  . Complete foot exam   10/19/2018  . Hemoglobin A1C  11/13/2018  . Eye exam for diabetics  05/04/2019  . Cologuard (Stool DNA test)  08/18/2019  . Tetanus Vaccine  06/03/2025  . Pneumonia vaccines  Completed     Aim for 7+ servings of fruits and vegetables daily  65-80+ fluid ounces of water or unsweet tea for healthy kidneys  Limit to max 1 drink of alcohol per day; avoid smoking/tobacco  Limit animal fats in diet for cholesterol and heart health - choose grass fed whenever available  Avoid highly processed foods, and foods high in saturated/trans fats  Aim for low stress - take time to unwind and care for your mental health  Aim for 150 min of moderate intensity exercise weekly for heart health, and weights twice weekly for bone health  Aim for 7-9 hours of sleep daily      When it comes to diets, agreement about the perfect plan isn't easy to find, even among the experts. Experts at the [employer] developed an idea known as the Healthy Eating Plate. Just imagine a plate divided into logical, healthy portions.  The emphasis is on diet quality:  Load up on vegetables and fruits - one-half of your plate: Aim for color and variety, and remember that potatoes don't count.  Go for whole grains - one-quarter of your plate: Whole wheat,  barley, wheat berries, quinoa, oats, brown rice, and foods made with them. If you want pasta, go with whole wheat pasta.  Protein power - one-quarter of your plate: Fish, chicken, beans, and nuts are all healthy, versatile protein sources. Limit red meat.  The diet, however, does go beyond the plate, offering a few other suggestions.  Use healthy plant oils, such as olive, canola, soy, corn, sunflower and peanut. Check the labels, and avoid partially hydrogenated oil, which have unhealthy trans fats.  If you're thirsty, drink water. Coffee and tea are good in moderation, but skip sugary drinks and limit milk and dairy products to one or two daily servings.  The type of carbohydrate in the diet is more important than the amount. Some sources of carbohydrates, such as vegetables, fruits, whole grains, and beans-are healthier than others.  Finally, stay active.

## 2018-08-30 LAB — CBC WITH DIFFERENTIAL/PLATELET
ABSOLUTE MONOCYTES: 478 {cells}/uL (ref 200–950)
BASOS PCT: 0.4 %
Basophils Absolute: 21 cells/uL (ref 0–200)
EOS ABS: 239 {cells}/uL (ref 15–500)
Eosinophils Relative: 4.6 %
HCT: 40.3 % (ref 38.5–50.0)
HEMOGLOBIN: 13.7 g/dL (ref 13.2–17.1)
Lymphs Abs: 1950 cells/uL (ref 850–3900)
MCH: 34.6 pg — AB (ref 27.0–33.0)
MCHC: 34 g/dL (ref 32.0–36.0)
MCV: 101.8 fL — AB (ref 80.0–100.0)
MONOS PCT: 9.2 %
MPV: 10.1 fL (ref 7.5–12.5)
NEUTROS ABS: 2512 {cells}/uL (ref 1500–7800)
Neutrophils Relative %: 48.3 %
Platelets: 182 10*3/uL (ref 140–400)
RBC: 3.96 10*6/uL — AB (ref 4.20–5.80)
RDW: 12.2 % (ref 11.0–15.0)
Total Lymphocyte: 37.5 %
WBC: 5.2 10*3/uL (ref 3.8–10.8)

## 2018-08-30 LAB — HEMOGLOBIN A1C
EAG (MMOL/L): 7.7 (calc)
HEMOGLOBIN A1C: 6.5 %{Hb} — AB (ref <5.7)
Mean Plasma Glucose: 140 (calc)

## 2018-08-30 LAB — COMPLETE METABOLIC PANEL WITH GFR
AG Ratio: 1.9 (calc) (ref 1.0–2.5)
ALBUMIN MSPROF: 4.5 g/dL (ref 3.6–5.1)
ALKALINE PHOSPHATASE (APISO): 48 U/L (ref 35–144)
ALT: 21 U/L (ref 9–46)
AST: 17 U/L (ref 10–35)
BILIRUBIN TOTAL: 0.6 mg/dL (ref 0.2–1.2)
BUN: 16 mg/dL (ref 7–25)
CHLORIDE: 103 mmol/L (ref 98–110)
CO2: 28 mmol/L (ref 20–32)
Calcium: 10.1 mg/dL (ref 8.6–10.3)
Creat: 0.89 mg/dL (ref 0.70–1.18)
GFR, Est African American: 98 mL/min/{1.73_m2} (ref >=60)
GFR, Est Non African American: 84 mL/min/{1.73_m2} (ref >=60)
GLUCOSE: 129 mg/dL — AB (ref 65–99)
Globulin: 2.4 g/dL (calc) (ref 1.9–3.7)
Potassium: 4.9 mmol/L (ref 3.5–5.3)
SODIUM: 140 mmol/L (ref 135–146)
Total Protein: 6.9 g/dL (ref 6.1–8.1)

## 2018-08-30 LAB — LIPID PANEL
CHOLESTEROL: 147 mg/dL (ref <200)
HDL: 47 mg/dL (ref >=40)
LDL Cholesterol (Calc): 73 mg/dL (calc)
Non-HDL Cholesterol (Calc): 100 mg/dL (calc) (ref <130)
TRIGLYCERIDES: 168 mg/dL — AB (ref <150)
Total CHOL/HDL Ratio: 3.1 (calc) (ref <5.0)

## 2018-08-30 LAB — MAGNESIUM: MAGNESIUM: 2 mg/dL (ref 1.5–2.5)

## 2018-08-30 LAB — TSH: TSH: 1.61 m[IU]/L (ref 0.40–4.50)

## 2018-09-03 ENCOUNTER — Other Ambulatory Visit: Payer: Self-pay

## 2018-09-03 DIAGNOSIS — E1122 Type 2 diabetes mellitus with diabetic chronic kidney disease: Secondary | ICD-10-CM

## 2018-09-03 DIAGNOSIS — N182 Chronic kidney disease, stage 2 (mild): Principal | ICD-10-CM

## 2018-09-03 MED ORDER — BLOOD GLUCOSE MONITORING SUPPL DEVI: 0 refills | Status: AC

## 2018-09-03 MED ORDER — GLUCOSE BLOOD VI STRP: ORAL_STRIP | 12 refills | Status: DC

## 2018-09-03 MED ORDER — LANCETS MISC: 0 refills | Status: DC

## 2018-09-03 NOTE — Telephone Encounter (Signed)
Patient states that insurance should cover accu-check or contour next. Request for Meter, Lancets and test strips.

## 2018-09-12 ENCOUNTER — Encounter: Payer: Self-pay | Admitting: Adult Health

## 2018-09-12 ENCOUNTER — Other Ambulatory Visit: Payer: Self-pay

## 2018-09-12 ENCOUNTER — Ambulatory Visit (INDEPENDENT_AMBULATORY_CARE_PROVIDER_SITE_OTHER): Payer: Medicare Other | Admitting: Adult Health

## 2018-09-12 VITALS — BP 132/60 | HR 78 | Temp 97.5°F | Ht 69.0 in | Wt 186.0 lb

## 2018-09-12 DIAGNOSIS — I25708 Atherosclerosis of coronary artery bypass graft(s), unspecified, with other forms of angina pectoris: Secondary | ICD-10-CM | POA: Diagnosis not present

## 2018-09-12 DIAGNOSIS — R1011 Right upper quadrant pain: Secondary | ICD-10-CM | POA: Diagnosis not present

## 2018-09-12 DIAGNOSIS — R1012 Left upper quadrant pain: Secondary | ICD-10-CM

## 2018-09-12 NOTE — Progress Notes (Signed)
Assessment and Plan:  Torin was seen today for acute visit.  Diagnoses and all orders for this visit:  Acute bilateral upper abdominal pain Unclear etiology; non-specific, check labs for general abd workup Some tenderness on exam but vague/mild, doesn't strongly suggest need for urgent imaging at this time Advised bland foods, avoid ETOH, stop ibuprofen Increase prilosec to BID, can try simethicone product, pepto-bismol The patient was advised to call immediately if he has any concerning symptoms in the interval. The patient voices understanding of current treatment options and is in agreement with the current care plan.The patient knows to call the clinic with any problems, questions or concerns or go to the ER if any further progression of symptoms.  -     CBC with Differential/Platelet -     COMPLETE METABOLIC PANEL WITH GFR -     Urinalysis w microscopic + reflex cultur -     Amylase -     Lipase  Further disposition pending results of labs. Discussed med's effects and SE's.   Over 30 minutes of exam, counseling, chart review, and critical decision making was performed.   Future Appointments  Date Time Provider [place]  11/02/2018  8:40 AM Croitoru, Dani Gobble, MD [facility]  12/06/2018  3:00 PM Unk Pinto, MD [facility] None  09/04/2019 10:00 AM Liane Comber, NP [facility] None    ------------------------------------------------------------------------------------------------------------------   HPI BP 132/60   Pulse 78   Temp (!) 97.5 F (36.4 C)   Ht '5\' 9"'$  (1.753 m)   Wt 186 lb (84.4 kg)   SpO2 98%   BMI 27.47 kg/m   73 y.o.male with hx of htn, CAD s/p CABG, GERD, IBS, T2DM presents for evaluation of new intermittent sharp abdominal pain that began yesterday evening. He endorses generalized upper abdominal pain that began prior to dinner, started periumbilical, relatively sudden onset, reports is constant but waxing and waning, ranges from 1-2 up to  8-9/10, relatively dull, non-radiating. Denies dyspnea, fatigue, upper extremity, jaw pain, chest pressure, palpitations. He denies nausea/vomiting, normal appetite, diarrhea or constipation. He does endorses increased belching. Denies sense of bloating. Pain is not notably associated with exertion, position, food or fluid intake. No known alleviating factors. He was able to rest last evening. Denies fever/chills.   He reports he thought may be indigestion, took extra tums last night but couldn't tell a difference. He takes prilosec 20 mg daily PRN, but has been taking daily since onset of pain.   He feels symptoms may be slightly improving.  He does not drink alcohol, he does take ibuprofen 400 mg occasionally, uses occasionally only, for back pain PRN.   He had normal colonoscopy in 2018 with no further follow up recommended.   Last abd imaging in 2002; normal Abd CT and ultrasound.  No hx of obstruction, diveticulosis, renal calculi, no hx of abdominal surgeries   Past Medical History:  Diagnosis Date  . Anemia    low iron  . Arthritis   . Cancer ([place])    skin cancer on face  . Cataracts, bilateral   . Coronary artery disease    CABG - 2008  . Diabetes mellitus without complication ([place])    type II  . GERD (gastroesophageal reflux disease)   . Hepatitis   . Hyperlipidemia   . Hypertension    pt states he does not have HTN, takes Atenolol for migraines  . Hypothyroidism   . Migraine headache with aura    takes Atenolol  . Sleep apnea  does not use cpap     Allergies  Allergen Reactions  . Gemfibrozil Other (See Comments)    Muscle aches  . Ace Inhibitors Cough  . Lipitor [Atorvastatin] Other (See Comments)    Joint pain.    Current Outpatient Medications on File Prior to Visit  Medication Sig  . acyclovir (ZOVIRAX) 800 MG tablet Take 1 tablet daily for Fever Blister / Mouth Ulcers  . aspirin 325 MG tablet Take 325 mg by mouth at bedtime.   Marland Kitchen atenolol (TENORMIN)  50 MG tablet Take 1 tablet (50 mg total) by mouth daily.  . Blood Glucose Monitoring Suppl DEVI Use kit to check blood sugar once a day  . Cholecalciferol 5000 UNITS TABS Take 1 tablet by mouth daily.  Marland Kitchen ezetimibe (ZETIA) 10 MG tablet TAKE 1 TABLET BY MOUTH EVERY DAY  . glucose blood (CONTOUR NEXT TEST) test strip Test blood sugar once daily  . IRON CR PO Take 1 tablet by mouth daily.  . Lancets MISC Check blood sugar once a day  . levothyroxine (SYNTHROID, LEVOTHROID) 50 MCG tablet TAKE 1 TABLET EVERY MORNING ON AN EMPTY STOMACH WITH ONLY WATER FOR 30 MINUTES  . losartan (COZAAR) 25 MG tablet Take 1 tablet 2 x /day for BP  . meclizine (ANTIVERT) 25 MG tablet 1/2-1 pill up to 3 times daily for motion sickness/dizziness (Patient taking differently: 1/2-1 pill up to 3 times as needed for motion sickness/dizziness)  . metFORMIN (GLUCOPHAGE XR) 500 MG 24 hr tablet Take 2 tablets 2 x / day with food for Diabetes  . Multiple Vitamin (MULTIVITAMIN) tablet Take 1 tablet by mouth at bedtime.   . Omega-3 Fatty Acids (FISH OIL) 1000 MG CAPS Take 1,000 mg by mouth at bedtime.   Marland Kitchen omeprazole (PRILOSEC) 20 MG capsule Take 20 mg by mouth as needed. OTC about 3 times a week.  . rosuvastatin (CRESTOR) 10 MG tablet Take 1 tablet twice a week   No current facility-administered medications on file prior to visit.     ROS: all negative except above.   Physical Exam:  BP 132/60   Pulse 78   Temp (!) 97.5 F (36.4 C)   Ht '5\' 9"'$  (1.753 m)   Wt 186 lb (84.4 kg)   SpO2 98%   BMI 27.47 kg/m   General Appearance: Well nourished, in no apparent distress. Eyes: PERRLA, conjunctiva no swelling or erythema ENT/Mouth: No erythema, swelling, or exudate on post pharynx.  Tonsils not swollen or erythematous. Hearing normal.  Neck: Supple, thyroid normal.  Respiratory: Respiratory effort normal, BS equal bilaterally without rales, rhonchi, wheezing or stridor.  Cardio: RRR with no MRGs. Brisk peripheral pulses  without edema.  Abdomen: Soft, + BS.  Mild generalized upper abdominal tenderness, no guarding, rebound, He does have ventral hernia with bearing down, non-tender, non-erythematous, no palpable solid or pulsatile masses. Lymphatics: Non tender without lymphadenopathy.  Musculoskeletal: normal gait.  Skin: Warm, dry without rashes, lesions, ecchymosis.  Neuro: Normal muscle tone, no cerebellar symptoms.  Psych: Awake and oriented X 3, normal affect, Insight and Judgment appropriate.     Izora Ribas, NP 5:04 PM [facility]

## 2018-09-12 NOTE — Patient Instructions (Signed)
Increase prilosec to twice a day for a few days  Tri gas-X or other simethicone product, or can try pepto-bismol  Will see what labs show - monitor symptoms at home  Stick to liquids and bland foods for now  STOP ibuprofen -   Please go to the ER if you have any severe AB pain, fever/chills, unable to hold down food/water, blood in stool or vomit, chest pain, shortness of breath, or any worsening symptoms.    Abdominal Pain, Adult Abdominal pain can be caused by many things. Often, abdominal pain is not serious and it gets better with no treatment or by being treated at home. However, sometimes abdominal pain is serious. Your health care provider will do a medical history and a physical exam to try to determine the cause of your abdominal pain. Follow these instructions at home:  Take over-the-counter and prescription medicines only as told by your health care provider. Do not take a laxative unless told by your health care provider.  Drink enough fluid to keep your urine clear or pale yellow.  Watch your condition for any changes.  Keep all follow-up visits as told by your health care provider. This is important. Contact a health care provider if:  Your abdominal pain changes or gets worse.  You are not hungry or you lose weight without trying.  You are constipated or have diarrhea for more than 2-3 days.  You have pain when you urinate or have a bowel movement.  Your abdominal pain wakes you up at night.  Your pain gets worse with meals, after eating, or with certain foods.  You are throwing up and cannot keep anything down.  You have a fever. Get help right away if:  Your pain does not go away as soon as your health care provider told you to expect.  You cannot stop throwing up.  Your pain is only in areas of the abdomen, such as the right side or the left lower portion of the abdomen.  You have bloody or black stools, or stools that look like tar.  You have  severe pain, cramping, or bloating in your abdomen.  You have signs of dehydration, such as: ? Dark urine, very little urine, or no urine. ? Cracked lips. ? Dry mouth. ? Sunken eyes. ? Sleepiness. ? Weakness. This information is not intended to replace advice given to you by your health care provider. Make sure you discuss any questions you have with your health care provider. Document Released: 03/30/2005 Document Revised: 01/08/2016 Document Reviewed: 12/02/2015 Elsevier Interactive Patient Education  2019 [employer].

## 2018-09-13 LAB — URINALYSIS W MICROSCOPIC + REFLEX CULTURE
BILIRUBIN URINE: NEGATIVE
Bacteria, UA: NONE SEEN /HPF
Glucose, UA: NEGATIVE
Hgb urine dipstick: NEGATIVE
Hyaline Cast: NONE SEEN /LPF
Ketones, ur: NEGATIVE
Leukocyte Esterase: NEGATIVE
Nitrites, Initial: NEGATIVE
Protein, ur: NEGATIVE
RBC / HPF: NONE SEEN /HPF (ref 0–2)
Specific Gravity, Urine: 1.014 (ref 1.001–1.03)
Squamous Epithelial / HPF: NONE SEEN /HPF (ref <=5)
WBC, UA: NONE SEEN /HPF (ref 0–5)
pH: 6 (ref 5.0–8.0)

## 2018-09-13 LAB — COMPLETE METABOLIC PANEL WITH GFR
AG Ratio: 1.8 (calc) (ref 1.0–2.5)
ALT: 21 U/L (ref 9–46)
AST: 19 U/L (ref 10–35)
Albumin: 4.5 g/dL (ref 3.6–5.1)
Alkaline phosphatase (APISO): 53 U/L (ref 35–144)
BUN: 16 mg/dL (ref 7–25)
CO2: 28 mmol/L (ref 20–32)
CREATININE: 1.08 mg/dL (ref 0.70–1.18)
Calcium: 10 mg/dL (ref 8.6–10.3)
Chloride: 101 mmol/L (ref 98–110)
GFR, Est African American: 78 mL/min/{1.73_m2} (ref >=60)
GFR, Est Non African American: 67 mL/min/{1.73_m2} (ref >=60)
GLUCOSE: 138 mg/dL — AB (ref 65–99)
Globulin: 2.5 g/dL (calc) (ref 1.9–3.7)
Potassium: 4.5 mmol/L (ref 3.5–5.3)
Sodium: 138 mmol/L (ref 135–146)
Total Bilirubin: 0.5 mg/dL (ref 0.2–1.2)
Total Protein: 7 g/dL (ref 6.1–8.1)

## 2018-09-13 LAB — AMYLASE: Amylase: 36 U/L (ref 21–101)

## 2018-09-13 LAB — LIPASE: Lipase: 81 U/L — ABNORMAL HIGH (ref 7–60)

## 2018-09-13 LAB — CBC WITH DIFFERENTIAL/PLATELET
Absolute Monocytes: 607 cells/uL (ref 200–950)
BASOS PCT: 0.8 %
Basophils Absolute: 59 cells/uL (ref 0–200)
Eosinophils Absolute: 252 cells/uL (ref 15–500)
Eosinophils Relative: 3.4 %
HCT: 39.3 % (ref 38.5–50.0)
HEMOGLOBIN: 13.4 g/dL (ref 13.2–17.1)
Lymphs Abs: 2301 cells/uL (ref 850–3900)
MCH: 34.4 pg — ABNORMAL HIGH (ref 27.0–33.0)
MCHC: 34.1 g/dL (ref 32.0–36.0)
MCV: 100.8 fL — ABNORMAL HIGH (ref 80.0–100.0)
MPV: 10 fL (ref 7.5–12.5)
Monocytes Relative: 8.2 %
Neutro Abs: 4181 cells/uL (ref 1500–7800)
Neutrophils Relative %: 56.5 %
Platelets: 183 10*3/uL (ref 140–400)
RBC: 3.9 10*6/uL — AB (ref 4.20–5.80)
RDW: 12.1 % (ref 11.0–15.0)
Total Lymphocyte: 31.1 %
WBC: 7.4 10*3/uL (ref 3.8–10.8)

## 2018-09-13 LAB — NO CULTURE INDICATED

## 2018-11-02 ENCOUNTER — Ambulatory Visit: Payer: Medicare Other | Admitting: Cardiovascular Disease

## 2018-11-12 ENCOUNTER — Other Ambulatory Visit: Payer: Self-pay

## 2018-11-12 ENCOUNTER — Encounter: Payer: Self-pay | Admitting: Adult Health

## 2018-11-12 ENCOUNTER — Ambulatory Visit (INDEPENDENT_AMBULATORY_CARE_PROVIDER_SITE_OTHER): Payer: Medicare Other | Admitting: Adult Health

## 2018-11-12 VITALS — BP 140/78 | HR 55 | Temp 96.3°F | Ht 69.0 in | Wt 185.0 lb

## 2018-11-12 DIAGNOSIS — I25708 Atherosclerosis of coronary artery bypass graft(s), unspecified, with other forms of angina pectoris: Secondary | ICD-10-CM

## 2018-11-12 DIAGNOSIS — H6191 Disorder of right external ear, unspecified: Secondary | ICD-10-CM

## 2018-11-12 MED ORDER — FLUOROURACIL 0.5 % EX CREA: TOPICAL_CREAM | Freq: Two times a day (BID) | CUTANEOUS | 0 refills | Status: DC

## 2018-11-12 MED ORDER — TRIAMCINOLONE ACETONIDE 0.1 % EX OINT: 1.0000 "application " | TOPICAL_OINTMENT | Freq: Two times a day (BID) | CUTANEOUS | 1 refills | Status: DC

## 2018-11-12 NOTE — Progress Notes (Signed)
Assessment and Plan:  Joshua Higgins was seen today for ear pain.  Diagnoses and all orders for this visit:  Skin lesion of right ear ? Basal cell though tenderness is unusual Dr. Melford Aase visualized; will trial fluoruracil x 3-4 weeks and follow up as scheduled for recheck per his recommendation Stop topical OTC agents Follow up with any questions concerns; he has used this agent previously and is familiar; information provided on AVS Refer back to skin cancer center for biopsy/excision if needed -     fluoruracil (CARAC) 0.5 % cream; Apply topically 2 (two) times daily.  Further disposition pending results of labs. Discussed med's effects and SE's.   Over 15 minutes of exam, counseling, chart review, and critical decision making was performed.   Future Appointments  Date Time Provider [place]  12/06/2018  3:00 PM Unk Pinto, MD [facility] None  02/13/2019  9:20 AM Croitoru, Dani Gobble, MD [facility]  09/04/2019 10:00 AM Liane Comber, NP [facility] None    ------------------------------------------------------------------------------------------------------------------   HPI 73 y.o.male with hx of recurrent facial skin cancers presents for evaluation of a painful lesion to auricle of R ear x 3 weeks.   He is unsure when it appeared, but noted 3 weeks ago, was very tender, wife attempted to "pop" without successful. He reports was erythematous at one point but that has resolved. Denies discharge. He has been applying topical antibiotic (polysporin) and has topical numbing cream. No significant change in the last few weeks.   He has extensive history of facial skin cancers. Was seeing skin cancer center but recently has been managed via this office/Dr. Melford Aase.         Past Medical History:  Diagnosis Date  . Anemia    low iron  . Arthritis   . Cancer ([place])    skin cancer on face  . Cataracts, bilateral   . Coronary artery disease    CABG - 2008  . Diabetes  mellitus without complication ([place])    type II  . GERD (gastroesophageal reflux disease)   . Hepatitis   . Hyperlipidemia   . Hypertension    pt states he does not have HTN, takes Atenolol for migraines  . Hypothyroidism   . Migraine headache with aura    takes Atenolol  . Sleep apnea    does not use cpap     Allergies  Allergen Reactions  . Gemfibrozil Other (See Comments)    Muscle aches  . Ace Inhibitors Cough  . Lipitor [Atorvastatin] Other (See Comments)    Joint pain.    Current Outpatient Medications on File Prior to Visit  Medication Sig  . acyclovir (ZOVIRAX) 800 MG tablet Take 1 tablet daily for Fever Blister / Mouth Ulcers (Patient taking differently: as needed. Take 1 tablet daily for Fever Blister / Mouth Ulcers)  . aspirin 325 MG tablet Take 325 mg by mouth at bedtime.   Marland Kitchen atenolol (TENORMIN) 50 MG tablet Take 1 tablet (50 mg total) by mouth daily.  . Blood Glucose Monitoring Suppl DEVI Use kit to check blood sugar once a day  . Cholecalciferol 5000 UNITS TABS Take 1 tablet by mouth daily.  Marland Kitchen ezetimibe (ZETIA) 10 MG tablet TAKE 1 TABLET BY MOUTH EVERY DAY  . glucose blood (CONTOUR NEXT TEST) test strip Test blood sugar once daily  . IRON CR PO Take 1 tablet by mouth daily.  . Lancets MISC Check blood sugar once a day  . levothyroxine (SYNTHROID, LEVOTHROID) 50 MCG tablet TAKE 1  TABLET EVERY MORNING ON AN EMPTY STOMACH WITH ONLY WATER FOR 30 MINUTES  . losartan (COZAAR) 25 MG tablet Take 1 tablet 2 x /day for BP (Patient taking differently: Take 1 tablet once a day for BP)  . meclizine (ANTIVERT) 25 MG tablet 1/2-1 pill up to 3 times daily for motion sickness/dizziness (Patient taking differently: 1/2-1 pill up to 3 times as needed for motion sickness/dizziness)  . metFORMIN (GLUCOPHAGE XR) 500 MG 24 hr tablet Take 2 tablets 2 x / day with food for Diabetes  . Multiple Vitamin (MULTIVITAMIN) tablet Take 1 tablet by mouth at bedtime.   . Omega-3 Fatty Acids (FISH  OIL) 1000 MG CAPS Take 1,000 mg by mouth at bedtime.   Marland Kitchen omeprazole (PRILOSEC) 20 MG capsule Take 20 mg by mouth as needed. OTC about 3 times a week.  . rosuvastatin (CRESTOR) 10 MG tablet Take 1 tablet twice a week   No current facility-administered medications on file prior to visit.     ROS: all negative except above.   Physical Exam:  BP 140/78   Pulse (!) 55   Temp (!) 96.3 F (35.7 C)   Ht _0  (1.753 m)   Wt 185 lb (83.9 kg)   SpO2 99%   BMI 27.32 kg/m   General Appearance: Well nourished, in no apparent distress. Eyes: PERRLA, conjunctiva no swelling or erythema ENT/Mouth: Ext aud canals clear, TMs without erythema, bulging. No erythema, swelling, or exudate on post pharynx.  Tonsils not swollen or erythematous. Hearing normal.  Neck: Supple Respiratory: Respiratory effort normal Cardio: RRR with no MRGs.  Lymphatics: Non tender without lymphadenopathy.  Musculoskeletal: normal gait.  Skin: Warm, dry without rashes, ecchymosis. He has raised, tender area to R auricle, approx 5 mm area, ? Pearly borders with central dip/ulceration Neuro: Sensation intact.  Psych: Awake and oriented X 3, normal affect, Insight and Judgment appropriate.     Joshua Ribas, NP 12:02 PM [facility]

## 2018-11-12 NOTE — Patient Instructions (Addendum)
STOP topical antibiotic  OK to do numbing cream if needed  STOP alcohol for now   Follow up with Dr. Melford Aase in 1 month as scheduled - if no change/improvement, may consider biopsy    Fluorouracil, 5-FU skin cream or solution What is this medicine? FLUOROURACIL, 5-FU (flure oh YOOR a sil) is a chemotherapy agent. It is used on the skin to treat skin cancer and certain types of skin conditions that could become cancer. This medicine may be used for other purposes; ask your health care provider or pharmacist if you have questions. COMMON BRAND NAME(S): Carac, Efudex, Fluoroplex, Tolak What should I tell my health care provider before I take this medicine? They need to know if you have any of these conditions: -dihydropyrimidine dehydrogenase (DPD) deficiency -an unusual or allergic reaction to fluorouracil, other chemotherapy, other medicines, foods, dyes, or preservatives -pregnant or trying to get pregnant -breast-feeding How should I use this medicine? This medicine is only for use on the skin. Follow the directions on the prescription label. Wash hands before and after use. Wash affected area and gently pat dry. To apply this medicine use a cotton-tipped applicator, or use gloves if applying with fingertips. If applied with unprotected fingertips, it is very important to wash your hands well after you apply this medicine. Avoid applying to the eyes, nose, or mouth. Apply enough medicine to cover the affected area. You can cover the area with a light gauze dressing, but do not use tight or air-tight dressings. Finish the full course prescribed by your doctor or health care professional, even if you think your condition is better. Do not stop taking except on the advice of your doctor or health care professional. Talk to your pediatrician regarding the use of this medicine in children. Special care may be needed. Overdosage: If you think you have taken too much of this medicine contact a  poison control center or emergency room at once. NOTE: This medicine is only for you. Do not share this medicine with others. What if I miss a dose? If you miss a dose, apply it as soon as you can. If it is almost time for your next dose, only use that dose. Do not apply extra doses. Contact your doctor or health care professional if you miss more than one dose. What may interact with this medicine? Interactions are not expected. Do not use any other skin products without telling your doctor or health care professional. This list may not describe all possible interactions. Give your health care provider a list of all the medicines, herbs, non-prescription drugs, or dietary supplements you use. Also tell them if you smoke, drink alcohol, or use illegal drugs. Some items may interact with your medicine. What should I watch for while using this medicine? Visit your doctor or health care professional for checks on your progress. You will need to use this medicine for 2 to 6 weeks. This may be longer depending on the condition being treated. You may not see full healing for another 1 to 2 months after you stop using the medicine. Treated areas of skin can look unsightly during and for several weeks after treatment with this medicine. Do not get this medicine in your eyes. If you do, rinse out with plenty of cool tap water. This medicine can make you more sensitive to the sun. Keep out of the sun. If you cannot avoid being in the sun, wear protective clothing and use sunscreen. Do not use sun lamps or  tanning beds/booths. If a pet comes in contact with the area where this medicine was applied to your skin or if it is ingested, they may have a serious risk of side effects. If accidental contact happens, the skin of the pet should be washed right away with soap and water. Contact your vet right away if your pet becomes exposed. Do not become pregnant while taking this medicine or for 1 month after stopping it.  Women should inform their doctor if they wish to become pregnant or think they might be pregnant. There is a potential for serious side effects to an unborn child. Talk to your health care professional or pharmacist for more information. What side effects may I notice from receiving this medicine? Side effects that you should report to your doctor or health care professional as soon as possible: -allergic reactions like skin rash, itching or hives, swelling of the face, lips, or tongue -bloody diarrhea -fever or chills -stomach pain -vomiting Side effects that usually do not require medical attention (report to your doctor or health care professional if they continue or are bothersome): -redness or dry skin -sensitivity to light This list may not describe all possible side effects. Call your doctor for medical advice about side effects. You may report side effects to FDA at 1-800-FDA-1088. Where should I keep my medicine? Keep out of the reach of children and pets. See product for storage instructions. Each product may have different instructions. Throw away any unused medicine after the expiration date. NOTE: This sheet is a summary. It may not cover all possible information. If you have questions about this medicine, talk to your doctor, pharmacist, or health care provider.  2019 Elsevier/Gold Standard (2015-07-31 19:12:02)

## 2018-11-15 ENCOUNTER — Encounter: Payer: Self-pay | Admitting: Internal Medicine

## 2018-11-23 ENCOUNTER — Encounter: Payer: Self-pay | Admitting: Internal Medicine

## 2018-11-29 ENCOUNTER — Ambulatory Visit: Payer: Self-pay | Admitting: Internal Medicine

## 2018-12-06 ENCOUNTER — Encounter: Payer: Self-pay | Admitting: Internal Medicine

## 2018-12-06 ENCOUNTER — Ambulatory Visit (INDEPENDENT_AMBULATORY_CARE_PROVIDER_SITE_OTHER): Payer: Medicare Other | Admitting: Internal Medicine

## 2018-12-06 ENCOUNTER — Other Ambulatory Visit: Payer: Self-pay

## 2018-12-06 VITALS — BP 132/68 | HR 72 | Temp 97.2°F | Resp 16 | Ht 69.0 in | Wt 180.2 lb

## 2018-12-06 DIAGNOSIS — E559 Vitamin D deficiency, unspecified: Secondary | ICD-10-CM

## 2018-12-06 DIAGNOSIS — N401 Enlarged prostate with lower urinary tract symptoms: Secondary | ICD-10-CM

## 2018-12-06 DIAGNOSIS — Z87891 Personal history of nicotine dependence: Secondary | ICD-10-CM

## 2018-12-06 DIAGNOSIS — I700 Atherosclerosis of aorta: Secondary | ICD-10-CM

## 2018-12-06 DIAGNOSIS — Z8249 Family history of ischemic heart disease and other diseases of the circulatory system: Secondary | ICD-10-CM

## 2018-12-06 DIAGNOSIS — K219 Gastro-esophageal reflux disease without esophagitis: Secondary | ICD-10-CM | POA: Diagnosis not present

## 2018-12-06 DIAGNOSIS — E1122 Type 2 diabetes mellitus with diabetic chronic kidney disease: Secondary | ICD-10-CM | POA: Diagnosis not present

## 2018-12-06 DIAGNOSIS — E039 Hypothyroidism, unspecified: Secondary | ICD-10-CM

## 2018-12-06 DIAGNOSIS — I25708 Atherosclerosis of coronary artery bypass graft(s), unspecified, with other forms of angina pectoris: Secondary | ICD-10-CM

## 2018-12-06 DIAGNOSIS — Z79899 Other long term (current) drug therapy: Secondary | ICD-10-CM

## 2018-12-06 DIAGNOSIS — N182 Chronic kidney disease, stage 2 (mild): Secondary | ICD-10-CM

## 2018-12-06 DIAGNOSIS — Z1211 Encounter for screening for malignant neoplasm of colon: Secondary | ICD-10-CM

## 2018-12-06 DIAGNOSIS — N138 Other obstructive and reflux uropathy: Secondary | ICD-10-CM

## 2018-12-06 DIAGNOSIS — Z136 Encounter for screening for cardiovascular disorders: Secondary | ICD-10-CM | POA: Diagnosis not present

## 2018-12-06 DIAGNOSIS — Z125 Encounter for screening for malignant neoplasm of prostate: Secondary | ICD-10-CM

## 2018-12-06 DIAGNOSIS — I10 Essential (primary) hypertension: Secondary | ICD-10-CM

## 2018-12-06 DIAGNOSIS — E782 Mixed hyperlipidemia: Secondary | ICD-10-CM

## 2018-12-06 NOTE — Patient Instructions (Signed)

## 2018-12-06 NOTE — Progress Notes (Signed)
[facility]   Unk Pinto, M.D.     Uvaldo Bristle. Silverio Lay, P.A.-C Liane Comber, [place]                [place], [place] [address] Telephone [phone] Telefax [phone] Comprehensive Evaluation & Examination  History of Present Illness:     This very nice 73 y.o. MWM  presents for a  comprehensive evaluation and management of multiple medical co-morbidities.  Patient has been followed for HTN, ASCAD CABG,  [name], [name], Hypothyroidism and Vitamin D Deficiency.     HTN predates since 1989.  Patient had ACS with PCA/Stent  in 2008 and then CABG in 2008.  He had Negative Cardiolites x 2 - 2013 & 2014 and a negative ETT in 2017.  Patient's BP has been controlled at home.  Today's BP is at goal- 132/68. Patient denies any cardiac symptoms as chest pain, palpitations, shortness of breath, dizziness or ankle swelling.     Patient's hyperlipidemia is controlled with diet and medications. Patient denies myalgias or other medication SE's. Last lipids were at goal albeit elevated Trig's: Lab Results  Component Value Date   CHOL 147 08/29/2018   HDL 47 08/29/2018   LDLCALC 73 08/29/2018   TRIG 168 (H) 08/29/2018   CHOLHDL 3.1 08/29/2018      Patient has hx/o [name] since 2000 with CKD2 and patient denies reactive hypoglycemic symptoms, visual blurring, diabetic polys or paresthesias. Last A1c was not at goal: Lab Results  Component Value Date   HGBA1C 6.5 (H) 08/29/2018                                        Patient has been on Thyroid Replacement since 2013.      Finally, patient has history of Vitamin D Deficiency ("33" / 2008) and last vitamin D was  Lab Results  Component Value Date   VD25OH 77 05/15/2018    Current Outpatient Medications on File Prior to Visit  Medication Sig  . acyclovir (ZOVIRAX) 800 MG tablet Take 1 tablet daily for Fever Blister / Mouth Ulcers  (Patient taking differently: as needed. Take 1 tablet daily for Fever Blister / Mouth Ulcers)  . aspirin 325 MG tablet Take 325 mg by mouth at bedtime.   Marland Kitchen atenolol (TENORMIN) 50 MG tablet Take 1 tablet (50 mg total) by mouth daily.  . Blood Glucose Monitoring Suppl DEVI Use kit to check blood sugar once a day  . Cholecalciferol 5000 UNITS TABS Take 1 tablet by mouth daily.  Marland Kitchen ezetimibe (ZETIA) 10 MG tablet TAKE 1 TABLET BY MOUTH EVERY DAY  . glucose blood (CONTOUR NEXT TEST) test strip Test blood sugar once daily  . IRON CR PO Take 1 tablet by mouth daily.  . Lancets MISC Check blood sugar once a day  . levothyroxine (SYNTHROID, LEVOTHROID) 50 MCG tablet TAKE 1 TABLET EVERY MORNING ON AN EMPTY STOMACH WITH ONLY WATER FOR 30 MINUTES  . losartan (COZAAR) 25 MG tablet Take 1 tablet 2 x /day for BP (Patient taking differently: Take 1 tablet once a day for BP)  . meclizine (ANTIVERT) 25 MG tablet 1/2-1 pill up to 3 times daily for motion sickness/dizziness (Patient taking differently: 1/2-1 pill up to 3 times as needed  for motion sickness/dizziness)  . metFORMIN (GLUCOPHAGE XR) 500 MG 24 hr tablet Take 2 tablets 2 x / day with food for Diabetes  . Multiple Vitamin (MULTIVITAMIN) tablet Take 1 tablet by mouth at bedtime.   . Omega-3 Fatty Acids (FISH OIL) 1000 MG CAPS Take 1,000 mg by mouth at bedtime.   Marland Kitchen omeprazole (PRILOSEC) 20 MG capsule Take 20 mg by mouth as needed. OTC about 3 times a week.  . rosuvastatin (CRESTOR) 10 MG tablet Take 1 tablet twice a week   No current facility-administered medications on file prior to visit.    Allergies  Allergen Reactions  . Gemfibrozil Other (See Comments)    Muscle aches  . Ace Inhibitors Cough  . Lipitor [Atorvastatin] Other (See Comments)    Joint pain.   Past Medical History:  Diagnosis Date  . Anemia    low iron  . Arthritis   . Cancer ([facility])    skin cancer on face  . Cataracts, bilateral   . Coronary artery disease    CABG - 2008  .  Diabetes mellitus without complication ([facility])    type II  . GERD (gastroesophageal reflux disease)   . Hepatitis   . Hyperlipidemia   . Hypertension    pt states he does not have HTN, takes Atenolol for migraines  . Hypothyroidism   . Migraine headache with aura    takes Atenolol  . Sleep apnea    does not use cpap   Health Maintenance  Topic Date Due  . Hepatitis C Screening  08/30/2019 (Originally 05-18-1946)  . INFLUENZA VACCINE  02/02/2019  . HEMOGLOBIN A1C  02/27/2019  . OPHTHALMOLOGY EXAM  05/04/2019  . Fecal DNA (Cologuard)  08/18/2019  . FOOT EXAM  12/06/2019  . TETANUS/TDAP  06/03/2025  . PNA vac Low Risk Adult  Completed   Immunization History  Administered Date(s) Administered  . DT 08/29/2003, 06/04/2015  . Influenza, High Dose Seasonal PF 04/03/2018  . Influenza-Unspecified 04/16/2014, 03/30/2016, 04/02/2017  . Pneumococcal Conjugate-13 12/26/2013  . Pneumococcal Polysaccharide-23 01/29/2010  . Zoster 05/11/2010  . Zoster Recombinat (Shingrix) 01/31/2017, 07/05/2017   Last Colon - (+) Cologard on 08/17/2016 and on 09/07/2016 had a Negative Colonoscopy  - Dr Amedeo Plenty recommended  No F/U   Past Surgical History:  Procedure Laterality Date  . BACK SURGERY    . CARDIAC SURGERY     Bypass  . CATARACT EXTRACTION, BILATERAL Bilateral 2018   Dr. Gershon Crane  . COLONOSCOPY    . CORONARY ANGIOPLASTY  2001  . CORONARY ARTERY BYPASS GRAFT    . ELBOW SURGERY Left    due to damaged nerve  . KNEE SURGERY Right    arthroscopic  . LUMBAR LAMINECTOMY/DECOMPRESSION MICRODISCECTOMY Right 08/04/2014   Procedure: Right Lumbar four-five Microdiskectomy;  Surgeon: Kristeen Miss, MD;  Location: [facility];  Service: Neurosurgery;  Laterality: Right;  Right L4-5 Microdiskectomy  . SURGERY SCROTAL / TESTICULAR     as a child/teenager   Family History  Problem Relation Age of Onset  . Ulcers Mother   . Arthritis Mother   . Heart attack Father   . CAD Sister   . Heart attack Brother    . CAD Brother   . Hypertension Brother   . Diabetes Maternal Grandmother    Social History   Socioeconomic History  . Marital status: Married    Spouse name: Pamala Hurry  . Number of children: 1 daughter  Occupational History  . 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  Tobacco Use  . Smoking status: Former Smoker    Last attempt to quit: 10/01/1973    Years since quitting: 45.2  . Smokeless tobacco: Never Used  Substance and Sexual Activity  . Alcohol use: No  . Drug use: No  . Sexual activity: Not on file    ROS Constitutional: Denies fever, chills, weight loss/gain, headaches, insomnia,  night sweats or change in appetite. Does c/o fatigue. Eyes: Denies redness, blurred vision, diplopia, discharge, itchy or watery eyes.  ENT: Denies discharge, congestion, post nasal drip, epistaxis, sore throat, earache, hearing loss, dental pain, Tinnitus, Vertigo, Sinus pain or snoring.  Cardio: Denies chest pain, palpitations, irregular heartbeat, syncope, dyspnea, diaphoresis, orthopnea, PND, claudication or edema Respiratory: denies cough, dyspnea, DOE, pleurisy, hoarseness, laryngitis or wheezing.  Gastrointestinal: Denies dysphagia, heartburn, reflux, water brash, pain, cramps, nausea, vomiting, bloating, diarrhea, constipation, hematemesis, melena, hematochezia, jaundice or hemorrhoids Genitourinary: Denies dysuria, frequency, urgency, nocturia, hesitancy, discharge, hematuria or flank pain Musculoskeletal: Denies arthralgia, myalgia, stiffness, Jt. Swelling, pain, limp or strain/sprain. Denies Falls. Skin: Denies puritis, rash, hives, warts, acne, eczema or change in skin lesion Neuro: No weakness, tremor, incoordination, spasms, paresthesia or pain Psychiatric: Denies confusion, memory loss or sensory loss. Denies Depression. Endocrine: Denies change in weight, skin, hair change, nocturia, and paresthesia, diabetic polys, visual blurring or hyper / hypo glycemic episodes.  Heme/Lymph: No excessive  bleeding, bruising or enlarged lymph nodes.  Physical Exam  BP 132/68   Pulse 72   Temp (!) 97.2 F (36.2 C)   Resp 16   Ht _0  (1.753 m)   Wt 180 lb 3.2 oz (81.7 kg)   BMI 26.61 kg/m   General Appearance: Well nourished and well groomed and in no apparent distress.  Eyes: PERRLA, EOMs, conjunctiva no swelling or erythema, normal fundi and vessels. Sinuses: No frontal/maxillary tenderness ENT/Mouth: EACs patent / TMs  nl. Nares clear without erythema, swelling, mucoid exudates. Oral hygiene is good. No erythema, swelling, or exudate. Tongue normal, non-obstructing. Tonsils not swollen or erythematous. Hearing normal.  Neck: Supple, thyroid not palpable. No bruits, nodes or JVD. Respiratory: Respiratory effort normal.  BS equal and clear bilateral without rales, rhonci, wheezing or stridor. Cardio: Heart sounds are normal with regular rate and rhythm and no murmurs, rubs or gallops. Peripheral pulses are normal and equal bilaterally without edema. No aortic or femoral bruits. Chest: symmetric with normal excursions and percussion.  Abdomen: Soft, with Nl bowel sounds. Nontender, no guarding, rebound, hernias, masses, or organomegaly.  Lymphatics: Non tender without lymphadenopathy.  Musculoskeletal: Full ROM all peripheral extremities, joint stability, 5/5 strength, and normal gait. Skin: Warm and dry without rashes, lesions, cyanosis, clubbing or  ecchymosis.  Neuro: Cranial nerves intact, reflexes equal bilaterally. Normal muscle tone, no cerebellar symptoms. Sensation intact.  Pysch: Alert and oriented X 3 with normal affect, insight and judgment appropriate.   Assessment and Plan  1. Essential hypertension  - CBC with Differential/Platelet - COMPLETE METABOLIC PANEL WITH GFR - Magnesium - TSH - EKG 12-Lead - [place], RETROPERITNL ABD,  LTD - Urinalysis, Routine w reflex microscopic - Microalbumin / creatinine urine ratio  2. Hyperlipidemia, mixed  - Lipid panel - TSH -  EKG 12-Lead - [place], RETROPERITNL ABD,  LTD  3. Type 2 diabetes mellitus with stage 2 chronic kidney disease, without long-term current use of insulin (HCC)  - COMPLETE METABOLIC PANEL WITH GFR - Hemoglobin A1c - Insulin, random - EKG 12-Lead - [place], RETROPERITNL ABD,  LTD - Urinalysis, Routine w  reflex microscopic - Microalbumin / creatinine urine ratio - HM DIABETES FOOT EXAM - LOW EXTREMITY NEUR EXAM DOCUM  4. Vitamin D deficiency  - VITAMIN D 25 Hydroxyl  5. Coronary artery disease involving coronary bypass graft of native heart with other forms of angina pectoris (HCC)  - Lipid panel - EKG 12-Lead  6. Hypothyroidism, unspecified type  - TSH  7. Gastroesophageal reflux disease, esophagitis presence not specified  - CBC with Differential/Platelet  8. Benign prostatic hyperplasia with urinary obstruction  - PSA  9. Atherosclerosis of abdominal aorta (HCC)  - [place], RETROPERITNL ABD,  LTD  10. FHx: heart disease  - EKG 12-Lead - [place], RETROPERITNL ABD,  LTD  11. Former smoker  - EKG 12-Lead - [place], RETROPERITNL ABD,  LTD  12. Screening for ischemic heart disease  - EKG 12-Lead - [place], RETROPERITNL ABD,  LTD  13. Screening for colorectal cancer  - POC Hemoccult Bld/Stl  14. Prostate cancer screening  - PSA  15. Screening for AAA (aortic abdominal aneurysm)  - [place], RETROPERITNL ABD,  LTD  16. Medication management  - CBC with Differential/Platelet - COMPLETE METABOLIC PANEL WITH GFR - Magnesium - Lipid panel - TSH - Hemoglobin A1c - Insulin, random - VITAMIN D 25 Hydroxyl - Urinalysis, Routine w reflex microscopic - Microalbumin / creatinine urine ratio           Patient was counseled in prudent diet, weight control to achieve/maintain BMI less than 25, BP monitoring, regular exercise and medications as discussed.  Discussed med effects and SE's. Routine screening labs and tests as requested with regular follow-up as recommended. I discussed the  assessment and treatment plan as above with the patient. The patient was provided an opportunity to ask questions and all were answered. The patient agreed with the plan and demonstrated an understanding of the instructions.Over 40 minutes of exam, counseling, chart review and high complex critical decision making was performed   Kirtland Bouchard, MD

## 2018-12-07 LAB — CBC WITH DIFFERENTIAL/PLATELET
Absolute Monocytes: 549 cells/uL (ref 200–950)
Basophils Absolute: 18 cells/uL (ref 0–200)
Basophils Relative: 0.3 %
Eosinophils Absolute: 201 cells/uL (ref 15–500)
Eosinophils Relative: 3.4 %
HCT: 40 % (ref 38.5–50.0)
Hemoglobin: 13.3 g/dL (ref 13.2–17.1)
Lymphs Abs: 2313 cells/uL (ref 850–3900)
MCH: 33.8 pg — ABNORMAL HIGH (ref 27.0–33.0)
MCHC: 33.3 g/dL (ref 32.0–36.0)
MCV: 101.5 fL — ABNORMAL HIGH (ref 80.0–100.0)
MPV: 9.5 fL (ref 7.5–12.5)
Monocytes Relative: 9.3 %
Neutro Abs: 2820 cells/uL (ref 1500–7800)
Neutrophils Relative %: 47.8 %
Platelets: 187 10*3/uL (ref 140–400)
RBC: 3.94 10*6/uL — ABNORMAL LOW (ref 4.20–5.80)
RDW: 11.9 % (ref 11.0–15.0)
Total Lymphocyte: 39.2 %
WBC: 5.9 10*3/uL (ref 3.8–10.8)

## 2018-12-07 LAB — URINALYSIS, ROUTINE W REFLEX MICROSCOPIC
Bilirubin Urine: NEGATIVE
Glucose, UA: NEGATIVE
Hgb urine dipstick: NEGATIVE
Leukocytes,Ua: NEGATIVE
Nitrite: NEGATIVE
Protein, ur: NEGATIVE
Specific Gravity, Urine: 1.024 (ref 1.001–1.03)
pH: <=5 (ref 5.0–8.0)

## 2018-12-07 LAB — MICROALBUMIN / CREATININE URINE RATIO
Creatinine, Urine: 159 mg/dL (ref 20–320)
Microalb Creat Ratio: 10 mcg/mg creat (ref <30)
Microalb, Ur: 1.6 mg/dL

## 2018-12-07 LAB — COMPLETE METABOLIC PANEL WITH GFR
AG Ratio: 1.9 (calc) (ref 1.0–2.5)
ALT: 19 U/L (ref 9–46)
AST: 17 U/L (ref 10–35)
Albumin: 4.6 g/dL (ref 3.6–5.1)
Alkaline phosphatase (APISO): 42 U/L (ref 35–144)
BUN: 19 mg/dL (ref 7–25)
CO2: 26 mmol/L (ref 20–32)
Calcium: 10.2 mg/dL (ref 8.6–10.3)
Chloride: 104 mmol/L (ref 98–110)
Creat: 0.93 mg/dL (ref 0.70–1.18)
GFR, Est African American: 93 mL/min/{1.73_m2} (ref >=60)
GFR, Est Non African American: 80 mL/min/{1.73_m2} (ref >=60)
Globulin: 2.4 g/dL (calc) (ref 1.9–3.7)
Glucose, Bld: 117 mg/dL — ABNORMAL HIGH (ref 65–99)
Potassium: 4.5 mmol/L (ref 3.5–5.3)
Sodium: 141 mmol/L (ref 135–146)
Total Bilirubin: 0.6 mg/dL (ref 0.2–1.2)
Total Protein: 7 g/dL (ref 6.1–8.1)

## 2018-12-07 LAB — MAGNESIUM: Magnesium: 1.8 mg/dL (ref 1.5–2.5)

## 2018-12-07 LAB — HEMOGLOBIN A1C
Hgb A1c MFr Bld: 6.5 % of total Hgb — ABNORMAL HIGH (ref <5.7)
Mean Plasma Glucose: 140 (calc)
eAG (mmol/L): 7.7 (calc)

## 2018-12-07 LAB — LIPID PANEL
Cholesterol: 110 mg/dL (ref <200)
HDL: 46 mg/dL (ref >=40)
LDL Cholesterol (Calc): 43 mg/dL (calc)
Non-HDL Cholesterol (Calc): 64 mg/dL (calc) (ref <130)
Total CHOL/HDL Ratio: 2.4 (calc) (ref <5.0)
Triglycerides: 130 mg/dL (ref <150)

## 2018-12-07 LAB — PSA: PSA: 0.7 ng/mL (ref <=4.0)

## 2018-12-07 LAB — VITAMIN D 25 HYDROXY (VIT D DEFICIENCY, FRACTURES): Vit D, 25-Hydroxy: 77 ng/mL (ref 30–100)

## 2018-12-07 LAB — TSH: TSH: 1.78 mIU/L (ref 0.40–4.50)

## 2018-12-07 LAB — INSULIN, RANDOM: Insulin: 18.1 u[IU]/mL

## 2018-12-09 ENCOUNTER — Encounter: Payer: Self-pay | Admitting: Internal Medicine

## 2019-01-15 ENCOUNTER — Other Ambulatory Visit: Payer: Self-pay

## 2019-01-15 DIAGNOSIS — Z1212 Encounter for screening for malignant neoplasm of rectum: Secondary | ICD-10-CM | POA: Diagnosis not present

## 2019-01-15 DIAGNOSIS — Z1211 Encounter for screening for malignant neoplasm of colon: Secondary | ICD-10-CM

## 2019-01-15 LAB — POC HEMOCCULT BLD/STL (HOME/3-CARD/SCREEN)
Card #2 Fecal Occult Blod, POC: NEGATIVE
Card #3 Fecal Occult Blood, POC: NEGATIVE
Fecal Occult Blood, POC: NEGATIVE

## 2019-01-18 ENCOUNTER — Other Ambulatory Visit: Payer: Self-pay | Admitting: Internal Medicine

## 2019-01-18 DIAGNOSIS — E782 Mixed hyperlipidemia: Secondary | ICD-10-CM

## 2019-01-18 MED ORDER — EZETIMIBE 10 MG PO TABS: ORAL_TABLET | ORAL | 3 refills | Status: DC

## 2019-01-19 ENCOUNTER — Other Ambulatory Visit: Payer: Self-pay | Admitting: Internal Medicine

## 2019-01-19 DIAGNOSIS — E782 Mixed hyperlipidemia: Secondary | ICD-10-CM

## 2019-01-19 MED ORDER — EZETIMIBE 10 MG PO TABS: ORAL_TABLET | ORAL | 3 refills | Status: DC

## 2019-01-21 ENCOUNTER — Other Ambulatory Visit: Payer: Self-pay | Admitting: *Deleted

## 2019-01-21 DIAGNOSIS — E782 Mixed hyperlipidemia: Secondary | ICD-10-CM

## 2019-01-21 MED ORDER — EZETIMIBE 10 MG PO TABS: ORAL_TABLET | ORAL | 3 refills | Status: DC

## 2019-01-22 ENCOUNTER — Other Ambulatory Visit: Payer: Self-pay | Admitting: *Deleted

## 2019-01-22 DIAGNOSIS — E782 Mixed hyperlipidemia: Secondary | ICD-10-CM

## 2019-01-22 MED ORDER — EZETIMIBE 10 MG PO TABS: ORAL_TABLET | ORAL | 3 refills | Status: DC

## 2019-02-12 ENCOUNTER — Telehealth: Payer: Self-pay | Admitting: *Deleted

## 2019-02-12 NOTE — Telephone Encounter (Signed)
Left a message for the patient to call back to change his appointment to a virtual appointment for tomorrow 02/13/2019

## 2019-02-13 ENCOUNTER — Telehealth (INDEPENDENT_AMBULATORY_CARE_PROVIDER_SITE_OTHER): Payer: Medicare Other | Admitting: Cardiovascular Disease

## 2019-02-13 ENCOUNTER — Encounter: Payer: Self-pay | Admitting: Cardiovascular Disease

## 2019-02-13 ENCOUNTER — Telehealth: Payer: Self-pay | Admitting: *Deleted

## 2019-02-13 VITALS — BP 120/70 | HR 68 | Wt 180.0 lb

## 2019-02-13 DIAGNOSIS — E1169 Type 2 diabetes mellitus with other specified complication: Secondary | ICD-10-CM | POA: Diagnosis not present

## 2019-02-13 DIAGNOSIS — I25810 Atherosclerosis of coronary artery bypass graft(s) without angina pectoris: Secondary | ICD-10-CM

## 2019-02-13 DIAGNOSIS — I10 Essential (primary) hypertension: Secondary | ICD-10-CM

## 2019-02-13 DIAGNOSIS — E1122 Type 2 diabetes mellitus with diabetic chronic kidney disease: Secondary | ICD-10-CM

## 2019-02-13 DIAGNOSIS — E785 Hyperlipidemia, unspecified: Secondary | ICD-10-CM

## 2019-02-13 DIAGNOSIS — I700 Atherosclerosis of aorta: Secondary | ICD-10-CM | POA: Diagnosis not present

## 2019-02-13 DIAGNOSIS — E663 Overweight: Secondary | ICD-10-CM

## 2019-02-13 DIAGNOSIS — N182 Chronic kidney disease, stage 2 (mild): Secondary | ICD-10-CM | POA: Diagnosis not present

## 2019-02-13 DIAGNOSIS — I25708 Atherosclerosis of coronary artery bypass graft(s), unspecified, with other forms of angina pectoris: Secondary | ICD-10-CM

## 2019-02-13 MED ORDER — ASPIRIN EC 81 MG PO TBEC: 81.0000 mg | DELAYED_RELEASE_TABLET | Freq: Every day | ORAL | 3 refills | Status: DC

## 2019-02-13 MED ORDER — LOSARTAN POTASSIUM 25 MG PO TABS: 12.5000 mg | ORAL_TABLET | Freq: Every day | ORAL | 3 refills | Status: DC

## 2019-02-13 NOTE — Progress Notes (Signed)
Virtual Visit via Telephone Note   This visit type was conducted due to national recommendations for restrictions regarding the COVID-19 Pandemic (e.g. social distancing) in an effort to limit this patient's exposure and mitigate transmission in our community.  Due to his co-morbid illnesses, this patient is at least at moderate risk for complications without adequate follow up.  This format is felt to be most appropriate for this patient at this time.  The patient did not have access to video technology/had technical difficulties with video requiring transitioning to audio format only (telephone).  All issues noted in this document were discussed and addressed.  No physical exam could be performed with this format.  Please refer to the patient's chart for his  consent to telehealth for [facility].   Date:  02/13/2019   ID:  Joshua Higgins, DOB 1945/09/20, MRN [id number]  Patient Location: Home Provider Location: Home  PCP:  Unk Pinto, MD  Cardiologist:  Allan Minotti Electrophysiologist:  None   Evaluation Performed:  Follow-Up Visit  Chief Complaint:  CAD f/u  History of Present Illness:    Joshua Higgins is a 73 y.o. male who is now roughly 9 years after coronary bypass surgery (LIMA to LAD, SVG to ramus intermedius and OM, SVG to acute marginal branch of RCA), and also has a history of hyperlipidemia, hypertension, type 2 diabetes mellitus controlled with oral antidiabetics, erectile dysfunction, migraines.  He is intolerant to statins as a daily medication (multiple agents tried) and had an exceptionally good response to twice weekly rosuvastatin plus ezetimibe.  He was diagnosed based on an abnormal screening stress test and has never had convincing angina pectoris.  He had normal functional capacity and no angina during stress testing in April 2019.  He exercised for about 6 minutes, 1 minute less compared with 2017.  No high risk ECG changes occurred.  Just a few days  ago he was able to work for several hours outside in the heat, moving some large rocks in his yard, without complaints.  He felt tired but did not have chest pain or dyspnea.  A couple of times he has had dizziness when standing up in his workshop.  He wonders if the Zetia might be causing the symptoms the pharmacy put a sticker on the bottle saying that it may cause dizziness.  I told him it is more likely to be the atenolol and losartan.  The patient specifically denies any chest pain at rest exertion, dyspnea at rest or with exertion, orthopnea, paroxysmal nocturnal dyspnea, syncope, palpitations, focal neurological deficits, intermittent claudication, lower extremity edema, unexplained weight gain, cough, hemoptysis or wheezing.  The patient does not have symptoms concerning for COVID-19 infection (fever, chills, cough, or new shortness of breath).    Past Medical History:  Diagnosis Date  . Anemia    low iron  . Arthritis   . Cancer ([facility])    skin cancer on face  . Cataracts, bilateral   . Coronary artery disease    CABG - 2008  . Diabetes mellitus without complication ([place])    type II  . GERD (gastroesophageal reflux disease)   . Hepatitis   . Hyperlipidemia   . Hypertension    pt states he does not have HTN, takes Atenolol for migraines  . Hypothyroidism   . Migraine headache with aura    takes Atenolol  . Sleep apnea    does not use cpap   Past Surgical History:  Procedure Laterality Date  .  BACK SURGERY    . CARDIAC SURGERY     Bypass  . CATARACT EXTRACTION, BILATERAL Bilateral 2018   Dr. Gershon Crane  . COLONOSCOPY    . CORONARY ANGIOPLASTY  2001  . CORONARY ARTERY BYPASS GRAFT    . ELBOW SURGERY Left    due to damaged nerve  . KNEE SURGERY Right    arthroscopic  . LUMBAR LAMINECTOMY/DECOMPRESSION MICRODISCECTOMY Right 08/04/2014   Procedure: Right Lumbar four-five Microdiskectomy;  Surgeon: Kristeen Miss, MD;  Location: [facility];  Service: Neurosurgery;  Laterality:  Right;  Right L4-5 Microdiskectomy  . SURGERY SCROTAL / TESTICULAR     as a child/teenager     Current Meds  Medication Sig  . acyclovir (ZOVIRAX) 800 MG tablet Take 1 tablet daily for Fever Blister / Mouth Ulcers (Patient taking differently: as needed. Take 1 tablet daily for Fever Blister / Mouth Ulcers)  . atenolol (TENORMIN) 50 MG tablet Take 1 tablet (50 mg total) by mouth daily.  . Blood Glucose Monitoring Suppl DEVI Use kit to check blood sugar once a day  . Cholecalciferol 5000 UNITS TABS Take 1 tablet by mouth daily.  Marland Kitchen ezetimibe (ZETIA) 10 MG tablet Take 1 tablet Daily for E11.69 (Cholesterol)  . glucose blood (CONTOUR NEXT TEST) test strip Test blood sugar once daily  . IRON CR PO Take 1 tablet by mouth daily.  . Lancets MISC Check blood sugar once a day  . levothyroxine (SYNTHROID, LEVOTHROID) 50 MCG tablet TAKE 1 TABLET EVERY MORNING ON AN EMPTY STOMACH WITH ONLY WATER FOR 30 MINUTES  . losartan (COZAAR) 25 MG tablet Take 0.5 tablets (12.5 mg total) by mouth at bedtime.  . metFORMIN (GLUCOPHAGE XR) 500 MG 24 hr tablet Take 2 tablets 2 x / day with food for Diabetes  . Multiple Vitamin (MULTIVITAMIN) tablet Take 1 tablet by mouth at bedtime.   . Omega-3 Fatty Acids (FISH OIL) 1000 MG CAPS Take 1,000 mg by mouth at bedtime.   Marland Kitchen omeprazole (PRILOSEC) 20 MG capsule Take 20 mg by mouth as needed. OTC about 3 times a week.  . rosuvastatin (CRESTOR) 10 MG tablet Take 1 tablet twice a week  . [DISCONTINUED] aspirin 325 MG tablet Take 325 mg by mouth at bedtime.   . [DISCONTINUED] losartan (COZAAR) 25 MG tablet Take 1 tablet 2 x /day for BP (Patient taking differently: Take 1 tablet once a day for BP)     Allergies:   Gemfibrozil, Ace inhibitors, and Lipitor [atorvastatin]   Social History   Tobacco Use  . Smoking status: Former Smoker    Quit date: 10/01/1973    Years since quitting: 45.4  . Smokeless tobacco: Never Used  Substance Use Topics  . Alcohol use: No  . Drug use:  No     Family Hx: The patient's family history includes Arthritis in his mother; CAD in his brother and sister; Diabetes in his maternal grandmother; Heart attack in his brother and father; Hypertension in his brother; Ulcers in his mother.  ROS:   Please see the history of present illness.    All other systems reviewed and are negative.   Prior CV studies:   The following studies were reviewed today:  Notes and labs from Dr. Melford Aase  Labs/Other Tests and Data Reviewed:    EKG:  An ECG dated 12/06/2018 was personally reviewed today and demonstrated:  NSR, leaft axis deviation, chronic nonspecific T wave changes  Recent Labs: 12/06/2018: ALT 19; BUN 19; Creat 0.93; Hemoglobin 13.3;  Magnesium 1.8; Platelets 187; Potassium 4.5; Sodium 141; TSH 1.78   Lipid Panel     Component Value Date/Time   CHOL 110 12/06/2018 1520   TRIG 130 12/06/2018 1520   HDL 46 12/06/2018 1520   CHOLHDL 2.4 12/06/2018 1520   VLDL 25 02/14/2017 1214   LDLCALC 43 12/06/2018 1520     Recent Lipid Panel Lab Results  Component Value Date/Time   CHOL 110 12/06/2018 03:20 PM   TRIG 130 12/06/2018 03:20 PM   HDL 46 12/06/2018 03:20 PM   CHOLHDL 2.4 12/06/2018 03:20 PM   LDLCALC 43 12/06/2018 03:20 PM    Wt Readings from Last 3 Encounters:  02/13/19 180 lb (81.6 kg)  12/06/18 180 lb 3.2 oz (81.7 kg)  11/12/18 185 lb (83.9 kg)     Objective:    Vital Signs:  BP 120/70   Pulse 68   Wt 180 lb (81.6 kg)   BMI 26.58 kg/m    VITAL SIGNS:  reviewed Unable to examine  ASSESSMENT & PLAN:    1. CAD s/p CABG:  He is asymptomatic and quite active for his age.  He never had angina pectoris and will plan a treadmill stress test for follow-up after the coronavirus pandemic subsides.  Reduce aspirin to 81 mg daily, especially since he is also having some stomach upset. 2. HTN:  Excellent, possibly excessive control.  Reduce losartan to 12.5 mg at bedtime. 3. DM:  Well controlled, A1c 6.5%. 4. HLP:  excellent response on just twice weekly rosuvastatin plus ezetimibe.  All parameters in target range. 5. Atherosclerosis of the abdominal aorta: Without aneurysm, documented on imaging studies.  COVID-19 Education: The signs and symptoms of COVID-19 were discussed with the patient and how to seek care for testing (follow up with PCP or arrange E-visit).  The importance of social distancing was discussed today.  Time:   Today, I have spent 21 minutes with the patient with telehealth technology discussing the above problems.     Medication Adjustments/Labs and Tests Ordered: Current medicines are reviewed at length with the patient today.  Concerns regarding medicines are outlined above.   Tests Ordered: No orders of the defined types were placed in this encounter.   Medication Changes: Meds ordered this encounter  Medications  . aspirin EC 81 MG tablet    Sig: Take 1 tablet (81 mg total) by mouth daily.    Dispense:  90 tablet    Refill:  3  . losartan (COZAAR) 25 MG tablet    Sig: Take 0.5 tablets (12.5 mg total) by mouth at bedtime.    Dispense:  45 tablet    Refill:  3    Dose change. Patient will call when he needs a refill    Follow Up:  Virtual Visit or In Person 1 year  Signed, Sanda Klein, MD  02/13/2019 10:36 AM    [place]

## 2019-02-13 NOTE — Patient Instructions (Addendum)
Medication Instructions:  DECREASE the Aspirin to 81 mg once daily DECREASE the Losartan to 12.5 mg (half a tablet) daily at bedtime  If you need a refill on your cardiac medications before your next appointment, please call your pharmacy.   Lab work: None ordered.  Testing/Procedures: None ordered  Follow-Up: At [facility], you and your health needs are our priority.  As part of our continuing mission to provide you with exceptional heart care, we have created designated Provider Care Teams.  These Care Teams include your primary Cardiologist (physician) and Advanced Practice Providers (APPs -  Physician Assistants and Nurse Practitioners) who all work together to provide you with the care you need, when you need it. You will need a follow up appointment in 12 months.  Please call our office 2 months in advance to schedule this appointment.  You may see Sanda Klein, MD or one of the following Advanced Practice Providers on your designated Care Team: [place], [place] . Fabian Sharp, PA-C

## 2019-02-13 NOTE — Telephone Encounter (Signed)
The patient has been called and verified instructions from the virtual visit today. The AVS will be mailed.

## 2019-02-18 ENCOUNTER — Emergency Department (HOSPITAL_COMMUNITY): Payer: Medicare Other

## 2019-02-18 ENCOUNTER — Other Ambulatory Visit: Payer: Self-pay

## 2019-02-18 ENCOUNTER — Encounter (HOSPITAL_COMMUNITY): Payer: Self-pay | Admitting: Emergency Medicine

## 2019-02-18 ENCOUNTER — Observation Stay (HOSPITAL_COMMUNITY)
Admission: EM | Admit: 2019-02-18 | Discharge: 2019-02-19 | Disposition: A | Discharge status: Home / Self Care | Payer: Medicare Other | Attending: Internal Medicine | Admitting: Internal Medicine

## 2019-02-18 DIAGNOSIS — R079 Chest pain, unspecified: Principal | ICD-10-CM | POA: Insufficient documentation

## 2019-02-18 DIAGNOSIS — E1136 Type 2 diabetes mellitus with diabetic cataract: Secondary | ICD-10-CM | POA: Diagnosis not present

## 2019-02-18 DIAGNOSIS — K219 Gastro-esophageal reflux disease without esophagitis: Secondary | ICD-10-CM | POA: Diagnosis not present

## 2019-02-18 DIAGNOSIS — Z79890 Hormone replacement therapy (postmenopausal): Secondary | ICD-10-CM | POA: Diagnosis not present

## 2019-02-18 DIAGNOSIS — E039 Hypothyroidism, unspecified: Secondary | ICD-10-CM | POA: Diagnosis present

## 2019-02-18 DIAGNOSIS — G4730 Sleep apnea, unspecified: Secondary | ICD-10-CM | POA: Diagnosis not present

## 2019-02-18 DIAGNOSIS — I700 Atherosclerosis of aorta: Secondary | ICD-10-CM | POA: Diagnosis not present

## 2019-02-18 DIAGNOSIS — I1310 Hypertensive heart and chronic kidney disease without heart failure, with stage 1 through stage 4 chronic kidney disease, or unspecified chronic kidney disease: Secondary | ICD-10-CM | POA: Diagnosis not present

## 2019-02-18 DIAGNOSIS — I25810 Atherosclerosis of coronary artery bypass graft(s) without angina pectoris: Secondary | ICD-10-CM | POA: Diagnosis present

## 2019-02-18 DIAGNOSIS — E785 Hyperlipidemia, unspecified: Secondary | ICD-10-CM | POA: Insufficient documentation

## 2019-02-18 DIAGNOSIS — Z7984 Long term (current) use of oral hypoglycemic drugs: Secondary | ICD-10-CM | POA: Diagnosis not present

## 2019-02-18 DIAGNOSIS — M8580 Other specified disorders of bone density and structure, unspecified site: Secondary | ICD-10-CM | POA: Diagnosis not present

## 2019-02-18 DIAGNOSIS — R0602 Shortness of breath: Secondary | ICD-10-CM | POA: Diagnosis not present

## 2019-02-18 DIAGNOSIS — Z951 Presence of aortocoronary bypass graft: Secondary | ICD-10-CM | POA: Diagnosis not present

## 2019-02-18 DIAGNOSIS — Z87891 Personal history of nicotine dependence: Secondary | ICD-10-CM | POA: Diagnosis not present

## 2019-02-18 DIAGNOSIS — G43909 Migraine, unspecified, not intractable, without status migrainosus: Secondary | ICD-10-CM | POA: Diagnosis not present

## 2019-02-18 DIAGNOSIS — M1990 Unspecified osteoarthritis, unspecified site: Secondary | ICD-10-CM | POA: Diagnosis not present

## 2019-02-18 DIAGNOSIS — I10 Essential (primary) hypertension: Secondary | ICD-10-CM | POA: Diagnosis present

## 2019-02-18 DIAGNOSIS — Z20828 Contact with and (suspected) exposure to other viral communicable diseases: Secondary | ICD-10-CM | POA: Insufficient documentation

## 2019-02-18 DIAGNOSIS — R61 Generalized hyperhidrosis: Secondary | ICD-10-CM | POA: Diagnosis not present

## 2019-02-18 DIAGNOSIS — R0689 Other abnormalities of breathing: Secondary | ICD-10-CM | POA: Diagnosis not present

## 2019-02-18 DIAGNOSIS — E1122 Type 2 diabetes mellitus with diabetic chronic kidney disease: Secondary | ICD-10-CM | POA: Diagnosis present

## 2019-02-18 DIAGNOSIS — R0789 Other chest pain: Secondary | ICD-10-CM | POA: Diagnosis not present

## 2019-02-18 DIAGNOSIS — K589 Irritable bowel syndrome without diarrhea: Secondary | ICD-10-CM | POA: Insufficient documentation

## 2019-02-18 DIAGNOSIS — Z79899 Other long term (current) drug therapy: Secondary | ICD-10-CM | POA: Insufficient documentation

## 2019-02-18 DIAGNOSIS — Z7982 Long term (current) use of aspirin: Secondary | ICD-10-CM | POA: Insufficient documentation

## 2019-02-18 DIAGNOSIS — I2510 Atherosclerotic heart disease of native coronary artery without angina pectoris: Secondary | ICD-10-CM | POA: Insufficient documentation

## 2019-02-18 DIAGNOSIS — N182 Chronic kidney disease, stage 2 (mild): Secondary | ICD-10-CM | POA: Diagnosis not present

## 2019-02-18 DIAGNOSIS — E1169 Type 2 diabetes mellitus with other specified complication: Secondary | ICD-10-CM | POA: Diagnosis present

## 2019-02-18 DIAGNOSIS — Z85828 Personal history of other malignant neoplasm of skin: Secondary | ICD-10-CM | POA: Insufficient documentation

## 2019-02-18 LAB — CBC
HCT: 38.4 % — ABNORMAL LOW (ref 39.0–52.0)
Hemoglobin: 12.7 g/dL — ABNORMAL LOW (ref 13.0–17.0)
MCH: 34.7 pg — ABNORMAL HIGH (ref 26.0–34.0)
MCHC: 33.1 g/dL (ref 30.0–36.0)
MCV: 104.9 fL — ABNORMAL HIGH (ref 80.0–100.0)
Platelets: 176 10*3/uL (ref 150–400)
RBC: 3.66 MIL/uL — ABNORMAL LOW (ref 4.22–5.81)
RDW: 12.5 % (ref 11.5–15.5)
WBC: 6.8 10*3/uL (ref 4.0–10.5)
nRBC: 0 % (ref 0.0–0.2)

## 2019-02-18 LAB — BASIC METABOLIC PANEL
Anion gap: 12 (ref 5–15)
BUN: 12 mg/dL (ref 8–23)
CO2: 26 mmol/L (ref 22–32)
Calcium: 9.1 mg/dL (ref 8.9–10.3)
Chloride: 100 mmol/L (ref 98–111)
Creatinine, Ser: 1.17 mg/dL (ref 0.61–1.24)
GFR calc Af Amer: >60 mL/min (ref >60)
GFR calc non Af Amer: >60 mL/min (ref >60)
Glucose, Bld: 168 mg/dL — ABNORMAL HIGH (ref 70–99)
Potassium: 4.3 mmol/L (ref 3.5–5.1)
Sodium: 138 mmol/L (ref 135–145)

## 2019-02-18 LAB — D-DIMER, QUANTITATIVE: D-Dimer, Quant: 0.99 ug/mL-FEU — ABNORMAL HIGH (ref 0.00–0.50)

## 2019-02-18 LAB — TROPONIN I (HIGH SENSITIVITY)
Troponin I (High Sensitivity): 5 ng/L (ref <18)
Troponin I (High Sensitivity): 5 ng/L (ref <18)

## 2019-02-18 LAB — SARS CORONAVIRUS 2 (TAT 6-24 HRS): SARS Coronavirus 2: NEGATIVE

## 2019-02-18 MED ORDER — METFORMIN HCL 500 MG PO TABS
500.0000 mg | ORAL_TABLET | Freq: Two times a day (BID) | ORAL | Status: DC
  Administered 2019-02-19: 500 mg via ORAL
  Filled 2019-02-18: qty 1

## 2019-02-18 MED ORDER — LEVOTHYROXINE SODIUM 50 MCG PO TABS
50.0000 ug | ORAL_TABLET | Freq: Every day | ORAL | Status: DC
  Administered 2019-02-19: 50 ug via ORAL
  Filled 2019-02-18: qty 1

## 2019-02-18 MED ORDER — ATENOLOL 50 MG PO TABS
50.0000 mg | ORAL_TABLET | Freq: Every day | ORAL | Status: DC
  Administered 2019-02-19: 50 mg via ORAL
  Filled 2019-02-18: qty 2

## 2019-02-18 MED ORDER — LOSARTAN POTASSIUM 25 MG PO TABS
12.5000 mg | ORAL_TABLET | Freq: Every day | ORAL | Status: DC
  Filled 2019-02-18: qty 0.5

## 2019-02-18 MED ORDER — EZETIMIBE 10 MG PO TABS
10.0000 mg | ORAL_TABLET | Freq: Every day | ORAL | Status: DC
  Administered 2019-02-19: 10 mg via ORAL
  Filled 2019-02-18: qty 1

## 2019-02-18 MED ORDER — MORPHINE SULFATE (PF) 2 MG/ML IV SOLN: 2.0000 mg | INTRAVENOUS | Status: DC | PRN

## 2019-02-18 MED ORDER — ASPIRIN EC 81 MG PO TBEC
81.0000 mg | DELAYED_RELEASE_TABLET | Freq: Every day | ORAL | Status: DC
  Administered 2019-02-19: 81 mg via ORAL
  Filled 2019-02-18: qty 1

## 2019-02-18 MED ORDER — NITROGLYCERIN 0.4 MG SL SUBL: 0.4000 mg | SUBLINGUAL_TABLET | SUBLINGUAL | Status: DC | PRN

## 2019-02-18 MED ORDER — ENOXAPARIN SODIUM 40 MG/0.4ML ~~LOC~~ SOLN
40.0000 mg | SUBCUTANEOUS | Status: DC
  Filled 2019-02-18: qty 0.4

## 2019-02-18 MED ORDER — IOHEXOL 350 MG/ML SOLN
75.0000 mL | Freq: Once | INTRAVENOUS | Status: AC | PRN
  Administered 2019-02-18: 65 mL via INTRAVENOUS

## 2019-02-18 MED ORDER — PANTOPRAZOLE SODIUM 40 MG PO TBEC
40.0000 mg | DELAYED_RELEASE_TABLET | Freq: Every day | ORAL | Status: DC
  Administered 2019-02-18 – 2019-02-19 (×2): 40 mg via ORAL
  Filled 2019-02-18 (×2): qty 1

## 2019-02-18 NOTE — H&P (Signed)
History and Physical    Joshua Higgins DVV:[id number] DOB: [birth year] DOA: 02/18/2019  PCP: Unk Pinto, MD   Patient coming from: Home    Chief Complaint: Chest pain  HPI: Joshua Higgins is a 72 y.o. male with medical history significant of coronary artery disease status post CABG about 12 years ago, diabetes mellitus, GERD, hyperlipidemia, hypertension, hypothyroidism, migraine headaches who presented to the emergency department for evaluation of severe chest pain.  Patient started having chest at home this morning.  He thought that it was indigestion.  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  He finished maintenance of his car and was driving back to home but it was getting worse.  He describes the pain as sharp on the middle of his chest.  He also gave history of associated shortness of breath with diaphoresis.  He had severe chest pain on taking deep breaths.  He then presented to the emergency department. Patient has history of coronary disease and had CABG done about1 2 years ago.  He follows with cardiology. Patient seen and examined the bedside in the emergency department.  Looks comfortable.  Hemodynamically stable.  Currently chest pain-free.  Patient being admitted for overnight observation for chest pain rule out. Patient denied any fever, nausea, vomiting, abdominal pain, diarrhea, vomiting or loss of consciousness.  ED Course: Remained hemodynamically stable.  High-sensitivity troponin negative x2.  EKG did not show any significant ischemic changes.  D-dimer was mild elevated.  CT angio of chest was done which did not show any pulmonary embolism or any other significant abnormality.  Review of Systems: As per HPI otherwise 10 point review of systems negative.    Past Medical History:  Diagnosis Date  . Anemia    low iron  . Arthritis   . Cancer ([place])    skin cancer on face  . Cataracts, bilateral   . Coronary artery disease     CABG - 2008  . Diabetes mellitus without complication ([place])    type II  . GERD (gastroesophageal reflux disease)   . Hepatitis   . Hyperlipidemia   . Hypertension    pt states he does not have HTN, takes Atenolol for migraines  . Hypothyroidism   . Migraine headache with aura    takes Atenolol  . Sleep apnea    does not use cpap    Past Surgical History:  Procedure Laterality Date  . BACK SURGERY    . CARDIAC SURGERY     Bypass  . CATARACT EXTRACTION, BILATERAL Bilateral 2018   Dr. Gershon Crane  . COLONOSCOPY    . CORONARY ANGIOPLASTY  2001  . CORONARY ARTERY BYPASS GRAFT    . ELBOW SURGERY Left    due to damaged nerve  . KNEE SURGERY Right    arthroscopic  . LUMBAR LAMINECTOMY/DECOMPRESSION MICRODISCECTOMY Right 08/04/2014   Procedure: Right Lumbar four-five Microdiskectomy;  Surgeon: Kristeen Miss, MD;  Location: [facility];  Service: Neurosurgery;  Laterality: Right;  Right L4-5 Microdiskectomy  . SURGERY SCROTAL / TESTICULAR     as a child/teenager     reports that he quit smoking about 45 years ago. He has never used smokeless tobacco. He reports that he does not drink alcohol or use drugs.  Allergies  Allergen Reactions  . Gemfibrozil Other (See Comments)    Muscle aches  . Ace Inhibitors Cough  . Lipitor [Atorvastatin] Other (See Comments)    Joint pain.  Family History  Problem Relation Age of Onset  . Ulcers Mother   . Arthritis Mother   . Heart attack Father   . CAD Sister   . Heart attack Brother   . CAD Brother   . Hypertension Brother   . Diabetes Maternal Grandmother      Prior to Admission medications   Medication Sig Start Date End Date Taking? Authorizing Provider  acyclovir (ZOVIRAX) 800 MG tablet Take 1 tablet daily for Fever Blister / Mouth Ulcers Patient taking differently: as needed. Take 1 tablet daily for Fever Blister / Mouth Ulcers 07/30/18   Unk Pinto, MD  aspirin EC 81 MG tablet Take 1 tablet (81 mg total) by mouth daily.  02/13/19   Croitoru, Mihai, MD  atenolol (TENORMIN) 50 MG tablet Take 1 tablet (50 mg total) by mouth daily. 08/29/18   Liane Comber, NP  Blood Glucose Monitoring Suppl DEVI Use kit to check blood sugar once a day 09/03/18   Liane Comber, NP  Cholecalciferol 5000 UNITS TABS Take 1 tablet by mouth daily.    [provider]  ezetimibe (ZETIA) 10 MG tablet Take 1 tablet Daily for E11.69 (Cholesterol) 01/22/19   Unk Pinto, MD  glucose blood (CONTOUR NEXT TEST) test strip Test blood sugar once daily 09/03/18   Liane Comber, NP  IRON CR PO Take 1 tablet by mouth daily.    [provider]  Lancets MISC Check blood sugar once a day 09/03/18   Liane Comber, NP  levothyroxine (SYNTHROID, LEVOTHROID) 50 MCG tablet TAKE 1 TABLET EVERY MORNING ON AN EMPTY STOMACH WITH ONLY WATER FOR 30 MINUTES 07/13/18   Unk Pinto, MD  losartan (COZAAR) 25 MG tablet Take 0.5 tablets (12.5 mg total) by mouth at bedtime. 02/13/19   Croitoru, Mihai, MD  meclizine (ANTIVERT) 25 MG tablet 1/2-1 pill up to 3 times daily for motion sickness/dizziness Patient not taking: Reported on 02/13/2019 01/31/17   Vicie Mutters, PA-C  metFORMIN (GLUCOPHAGE XR) 500 MG 24 hr tablet Take 2 tablets 2 x / day with food for Diabetes 05/27/18   Unk Pinto, MD  Multiple Vitamin (MULTIVITAMIN) tablet Take 1 tablet by mouth at bedtime.     [provider]  Omega-3 Fatty Acids (FISH OIL) 1000 MG CAPS Take 1,000 mg by mouth at bedtime.     [provider]  omeprazole (PRILOSEC) 20 MG capsule Take 20 mg by mouth as needed. OTC about 3 times a week.    [provider]  rosuvastatin (CRESTOR) 10 MG tablet Take 1 tablet twice a week 08/29/18   Liane Comber, NP    Physical Exam: Vitals:   02/18/19 1700 02/18/19 1730 02/18/19 1745 02/18/19 1800  BP: 135/72 (!) 148/69 128/78 (!) 154/72  Pulse: 67 63  67  Resp: 15 16 (!) 25 18  Temp:      TempSrc:      SpO2: 98% 97%  97%     Constitutional: Calm, comfortable,pleasant elderly gentleman Vitals:   02/18/19 1700 02/18/19 1730 02/18/19 1745 02/18/19 1800  BP: 135/72 (!) 148/69 128/78 (!) 154/72  Pulse: 67 63  67  Resp: 15 16 (!) 25 18  Temp:      TempSrc:      SpO2: 98% 97%  97%   Eyes: PERRL, lids and conjunctivae normal ENMT: Mucous membranes are moist. Posterior pharynx clear of any exudate or lesions.Normal dentition.  Neck: normal, supple, no masses, no thyromegaly Respiratory: clear to auscultation bilaterally, no wheezing, no crackles. Normal  respiratory effort. No accessory muscle use.  Cardiovascular: Regular rate and rhythm, no murmurs / rubs / gallops. No extremity edema. 2+ pedal pulses. No carotid bruits.  CABG scar Abdomen: no tenderness, no masses palpated. No hepatosplenomegaly. Bowel sounds positive.  Musculoskeletal: no clubbing / cyanosis. No joint deformity upper and lower extremities. Good ROM, no contractures. Normal muscle tone.  Skin: no rashes, lesions, ulcers. No induration Neurologic: CN 2-12 grossly intact. Sensation intact, DTR normal. Strength 5/5 in all 4.  Psychiatric: Normal judgment and insight. Alert and oriented x 3. Normal mood.   Foley Catheter:None  Labs on Admission: I have personally reviewed following labs and imaging studies  CBC: Recent Labs  Lab 02/18/19 1303  WBC 6.8  HGB 12.7*  HCT 38.4*  MCV 104.9*  PLT 381   Basic Metabolic Panel: Recent Labs  Lab 02/18/19 1303  NA 138  K 4.3  CL 100  CO2 26  GLUCOSE 168*  BUN 12  CREATININE 1.17  CALCIUM 9.1   GFR: Estimated Creatinine Clearance: 54.6 mL/min (by C-G formula based on SCr of 1.17 mg/dL). Liver Function Tests: No results for input(s): AST, ALT, ALKPHOS, BILITOT, PROT, ALBUMIN in the last 168 hours. No results for input(s): LIPASE, AMYLASE in the last 168 hours. No results for input(s): AMMONIA in the last 168 hours. Coagulation Profile: No results for input(s): INR, PROTIME in the last 168  hours. Cardiac Enzymes: No results for input(s): CKTOTAL, CKMB, CKMBINDEX, TROPONINI in the last 168 hours. BNP (last 3 results) No results for input(s): PROBNP in the last 8760 hours. HbA1C: No results for input(s): HGBA1C in the last 72 hours. CBG: No results for input(s): GLUCAP in the last 168 hours. Lipid Profile: No results for input(s): CHOL, HDL, LDLCALC, TRIG, CHOLHDL, LDLDIRECT in the last 72 hours. Thyroid Function Tests: No results for input(s): TSH, T4TOTAL, FREET4, T3FREE, THYROIDAB in the last 72 hours. Anemia Panel: No results for input(s): VITAMINB12, FOLATE, FERRITIN, TIBC, IRON, RETICCTPCT in the last 72 hours. Urine analysis:    Component Value Date/Time   COLORURINE DARK YELLOW 12/06/2018 1520   APPEARANCEUR CLEAR 12/06/2018 1520   LABSPEC 1.024 12/06/2018 1520   PHURINE < OR = 5.0 12/06/2018 1520   GLUCOSEU NEGATIVE 12/06/2018 1520   HGBUR NEGATIVE 12/06/2018 1520   BILIRUBINUR NEGATIVE 07/11/2016 1426   KETONESUR TRACE (A) 12/06/2018 1520   PROTEINUR NEGATIVE 12/06/2018 1520   UROBILINOGEN 1.0 05/31/2007 0835   NITRITE NEGATIVE 12/06/2018 1520   LEUKOCYTESUR NEGATIVE 12/06/2018 1520    Radiological Exams on Admission: Ct Angio Chest Pe W And/or Wo Contrast  Result Date: 02/18/2019 CLINICAL DATA:  73 year old male with acute chest pain. EXAM: CT ANGIOGRAPHY CHEST WITH CONTRAST TECHNIQUE: Multidetector CT imaging of the chest was performed using the standard protocol during bolus administration of intravenous contrast. Multiplanar CT image reconstructions and MIPs were obtained to evaluate the vascular anatomy. CONTRAST:  63m OMNIPAQUE IOHEXOL 350 MG/ML SOLN COMPARISON:  02/18/2019 and prior chest radiographs FINDINGS: Cardiovascular: This is a technically satisfactory study. No pulmonary emboli are identified. Cardiomegaly and CABG changes noted. Coronary artery and aortic atherosclerotic calcifications are present. There is no evidence of thoracic aortic  aneurysm or pericardial effusion. Mediastinum/Nodes: No enlarged mediastinal, hilar, or axillary lymph nodes. Thyroid gland, trachea, and esophagus demonstrate no significant findings. Lungs/Pleura: There is no evidence of airspace disease, consolidation, mass, pleural effusion or pneumothorax. Upper Abdomen: No acute abnormality Musculoskeletal: No acute or suspicious bony abnormalities. Review of the MIP images confirms the above findings.  IMPRESSION: 1. No acute abnormality.  No evidence of pulmonary emboli. 2. Cardiomegaly and CABG changes 3.  Aortic Atherosclerosis (ICD10-I70.0). Electronically Signed   By: Margarette Canada M.D.   On: 02/18/2019 15:45   Dg Chest Portable 1 View  Result Date: 02/18/2019 CLINICAL DATA:  Chest pain and shortness of breath. EXAM: PORTABLE CHEST 1 VIEW COMPARISON:  10/10/2015. FINDINGS: Normal sized heart. Stable post CABG changes. Clear lungs with normal vascularity. Diffuse osteopenia. IMPRESSION: No acute abnormality. Electronically Signed   By: Claudie Revering M.D.   On: 02/18/2019 13:20     Assessment/Plan Principal Problem:   Chest pain Active Problems:   Hyperlipidemia associated with type 2 diabetes mellitus ([place])   Hypertension   GERD (gastroesophageal reflux disease)   CAD s/p CABG   Hypothyroidism   CKD stage 2 due to type 2 diabetes mellitus ([facility])   DM2 (diabetes mellitus, type 2) ([facility])    Chest pain: Atypical chest pain.  Not associated with exertion.  Described the pain as sharp, midsternal.  Currently chest pain-free.  EKG did not show any ischemic changes.  CT angiogram did not show any significant abnormality.  High-sensitivity troponins normal. We will monitor him overnight.  Will get echocardiogram.  Coronary artery disease: Follows with cardiology.  Will resume his home medicines.  History of CABG about 12 days ago  Elevated d-dimer: Mild elevated d-dimer.  CT angiogram did not show any pulmonary embolism.  Will check venous Doppler.   Hypertension: Current blood pressure stable.  Continue current medicines.  Hypothyroidism: Continue Synthyroid  CKD stage II: Currently kidney function at baseline  Diabetes type 2: On metformin at home which we will resume.  Last hemoglobin A1C of 6.5.  GERD: Continue Protonix  Hyperlipidemia: On Zetia and Crestor  History of migraines :On atenolol        Severity of Illness: The appropriate patient status for this patient is OBSERVATION.     DVT prophylaxis: Lovenox Code Status: Full Family Communication: Wife,daughter at bed side Consults called: None     Shelly Coss MD [facility] Pager [phone]  If 7PM-7AM, please contact night-coverage [email or web address] Password [facility]  02/18/2019, 6:10 PM

## 2019-02-18 NOTE — ED Notes (Signed)
Pt is NSR on monitor 

## 2019-02-18 NOTE — ED Notes (Signed)
ED TO INPATIENT HANDOFF REPORT  ED Nurse Name and Phone #:  [phone]  S Name/Age/Gender Joshua Higgins 73 y.o. male Room/Bed: 013C/013C  Code Status   Code Status: Full Code  Home/SNF/Other Home Patient oriented to: self, place, time and situation Is this baseline? Yes   Triage Complete: Triage complete  Chief Complaint Chest Pain  Triage Note Pt arrives via EMS from home with reports of central CP with no radiation. Hx of stent and bypass 324 ASA given   Allergies Allergies  Allergen Reactions  . Gemfibrozil Other (See Comments)    Muscle aches  . Ace Inhibitors Cough  . Lipitor [Atorvastatin] Other (See Comments)    Joint pain.    Level of Care/Admitting Diagnosis ED Disposition    ED Disposition Condition [facility] Area: [place] [[id number]]  Level of Care: Telemetry Medical [104]  I expect the patient will be discharged within 24 hours: Yes  LOW acuity---Tx typically complete <24 hrs---ACUTE conditions typically can be evaluated <24 hours---LABS likely to return to acceptable levels <24 hours---IS near functional baseline---EXPECTED to return to current living arrangement---NOT newly hypoxic: Does not meet criteria for 5C-Observation unit  Covid Evaluation: N/A  Diagnosis: Chest pain [856314]  Admitting Physician: Shelly Coss [[id number]]  Attending Physician: Shelly Coss [[id number]]  PT Class (Do Not Modify): Observation [104]  PT Acc Code (Do Not Modify): Observation [10022]       B Medical/Surgery History Past Medical History:  Diagnosis Date  . Anemia    low iron  . Arthritis   . Cancer ([place])    skin cancer on face  . Cataracts, bilateral   . Coronary artery disease    CABG - 2008  . Diabetes mellitus without complication ([place])    type II  . GERD (gastroesophageal reflux disease)   . Hepatitis   . Hyperlipidemia   . Hypertension    pt states he does not have HTN, takes Atenolol for migraines  .  Hypothyroidism   . Migraine headache with aura    takes Atenolol  . Sleep apnea    does not use cpap   Past Surgical History:  Procedure Laterality Date  . BACK SURGERY    . CARDIAC SURGERY     Bypass  . CATARACT EXTRACTION, BILATERAL Bilateral 2018   Dr. Gershon Crane  . COLONOSCOPY    . CORONARY ANGIOPLASTY  2001  . CORONARY ARTERY BYPASS GRAFT    . ELBOW SURGERY Left    due to damaged nerve  . KNEE SURGERY Right    arthroscopic  . LUMBAR LAMINECTOMY/DECOMPRESSION MICRODISCECTOMY Right 08/04/2014   Procedure: Right Lumbar four-five Microdiskectomy;  Surgeon: Kristeen Miss, MD;  Location: [place] [facility];  Service: Neurosurgery;  Laterality: Right;  Right L4-5 Microdiskectomy  . SURGERY SCROTAL / TESTICULAR     as a child/teenager     A IV Location/Drains/Wounds Patient Lines/Drains/Airways Status   Active Line/Drains/Airways    Name:   Placement date:   Placement time:   Site:   Days:   Peripheral IV 02/18/19 Left Antecubital   02/18/19    1458    Antecubital   less than 1   Incision (Closed) 08/04/14 Back   08/04/14    1029     1659          Intake/Output Last 24 hours No intake or output data in the 24 hours ending 02/18/19 2258  Labs/Imaging Results for orders placed or performed during the  hospital encounter of 02/18/19 (from the past 48 hour(s))  Troponin I (High Sensitivity)     Status: None   Collection Time: 02/18/19  1:03 PM  Result Value Ref Range   Troponin I (High Sensitivity) 5 <18 ng/L    Comment: (NOTE) Elevated high sensitivity troponin I (hsTnI) values and significant  changes across serial measurements may suggest ACS but many other  chronic and acute conditions are known to elevate hsTnI results.  Refer to the "Links" section for chest pain algorithms and additional  guidance. Performed at [facility], [place] [address], [place], [place] [address]   D-dimer, quantitative (not at [facility])     Status: Abnormal   Collection Time: 02/18/19  1:03 PM   Result Value Ref Range   D-Dimer, Quant 0.99 (H) 0.00 - 0.50 ug/mL-FEU    Comment: (NOTE) At the manufacturer cut-off of 0.50 ug/mL FEU, this assay has been documented to exclude PE with a sensitivity and negative predictive value of 97 to 99%.  At this time, this assay has not been approved by the FDA to exclude DVT/VTE. Results should be correlated with clinical presentation. Performed at [facility], [place] [address], [place], [place] [address]   CBC     Status: Abnormal   Collection Time: 02/18/19  1:03 PM  Result Value Ref Range   WBC 6.8 4.0 - 10.5 K/uL   RBC 3.66 (L) 4.22 - 5.81 MIL/uL   Hemoglobin 12.7 (L) 13.0 - 17.0 g/dL   HCT 38.4 (L) 39.0 - 52.0 %   MCV 104.9 (H) 80.0 - 100.0 fL   MCH 34.7 (H) 26.0 - 34.0 pg   MCHC 33.1 30.0 - 36.0 g/dL   RDW 12.5 11.5 - 15.5 %   Platelets 176 150 - 400 K/uL   nRBC 0.0 0.0 - 0.2 %    Comment: Performed at [facility], [place] [address], [place], [place] [address]  Basic metabolic panel     Status: Abnormal   Collection Time: 02/18/19  1:03 PM  Result Value Ref Range   Sodium 138 135 - 145 mmol/L   Potassium 4.3 3.5 - 5.1 mmol/L   Chloride 100 98 - 111 mmol/L   CO2 26 22 - 32 mmol/L   Glucose, Bld 168 (H) 70 - 99 mg/dL   BUN 12 8 - 23 mg/dL   Creatinine, Ser 1.17 0.61 - 1.24 mg/dL   Calcium 9.1 8.9 - 10.3 mg/dL   GFR calc non Af Amer >60 >60 mL/min   GFR calc Af Amer >60 >60 mL/min   Anion gap 12 5 - 15    Comment: Performed at [facility], [place] [address] [place], [place] [address]  Troponin I (High Sensitivity)     Status: None   Collection Time: 02/18/19  2:59 PM  Result Value Ref Range   Troponin I (High Sensitivity) 5 <18 ng/L    Comment: (NOTE) Elevated high sensitivity troponin I (hsTnI) values and significant  changes across serial measurements may suggest ACS but many other  chronic and acute conditions are known to elevate hsTnI results.  Refer to the "Links" section for chest pain algorithms and  additional  guidance. Performed at [facility], [place] [address], [place], [place] [address]    Ct Angio Chest Pe W And/or Wo Contrast  Result Date: 02/18/2019 CLINICAL DATA:  73 year old male with acute chest pain. EXAM: CT ANGIOGRAPHY CHEST WITH CONTRAST TECHNIQUE: Multidetector CT imaging of  the chest was performed using the standard protocol during bolus administration of intravenous contrast. Multiplanar CT image reconstructions and MIPs were obtained to evaluate the vascular anatomy. CONTRAST:  31mL OMNIPAQUE IOHEXOL 350 MG/ML SOLN COMPARISON:  02/18/2019 and prior chest radiographs FINDINGS: Cardiovascular: This is a technically satisfactory study. No pulmonary emboli are identified. Cardiomegaly and CABG changes noted. Coronary artery and aortic atherosclerotic calcifications are present. There is no evidence of thoracic aortic aneurysm or pericardial effusion. Mediastinum/Nodes: No enlarged mediastinal, hilar, or axillary lymph nodes. Thyroid gland, trachea, and esophagus demonstrate no significant findings. Lungs/Pleura: There is no evidence of airspace disease, consolidation, mass, pleural effusion or pneumothorax. Upper Abdomen: No acute abnormality Musculoskeletal: No acute or suspicious bony abnormalities. Review of the MIP images confirms the above findings. IMPRESSION: 1. No acute abnormality.  No evidence of pulmonary emboli. 2. Cardiomegaly and CABG changes 3.  Aortic Atherosclerosis (ICD10-I70.0). Electronically Signed   By: Margarette Canada M.D.   On: 02/18/2019 15:45   Dg Chest Portable 1 View  Result Date: 02/18/2019 CLINICAL DATA:  Chest pain and shortness of breath. EXAM: PORTABLE CHEST 1 VIEW COMPARISON:  10/10/2015. FINDINGS: Normal sized heart. Stable post CABG changes. Clear lungs with normal vascularity. Diffuse osteopenia. IMPRESSION: No acute abnormality. Electronically Signed   By: Claudie Revering M.D.   On: 02/18/2019 13:20    Pending Labs Unresulted Labs (From  admission, onward)    Start     Ordered   02/18/19 1701  SARS CORONAVIRUS 2 Nasal Swab Aptima Multi Swab  (Asymptomatic/Tier 2 Patients Labs)  Once,   STAT    Question Answer Comment  Is this test for diagnosis or screening Screening   Symptomatic for COVID-19 as defined by CDC No   Hospitalized for COVID-19 No   Admitted to ICU for COVID-19 No   Previously tested for COVID-19 No   Resident in a congregate (group) care setting No   Employed in healthcare setting No      02/18/19 1700          Vitals/Pain Today's Vitals   02/18/19 2115 02/18/19 2130 02/18/19 2200 02/18/19 2230  BP:  (!) 134/91 (!) 149/70 (!) 142/72  Pulse: 74  71 73  Resp: 12 (!) 23 14 17   Temp:      TempSrc:      SpO2: 98%  98% 96%  PainSc:        Isolation Precautions No active isolations  Medications Medications  aspirin EC tablet 81 mg (81 mg Oral Not Given 02/18/19 1845)  atenolol (TENORMIN) tablet 50 mg (has no administration in time range)  ezetimibe (ZETIA) tablet 10 mg (has no administration in time range)  losartan (COZAAR) tablet 12.5 mg (has no administration in time range)  levothyroxine (SYNTHROID) tablet 50 mcg (has no administration in time range)  metFORMIN (GLUCOPHAGE-XR) 24 hr tablet 500 mg (has no administration in time range)  enoxaparin (LOVENOX) injection 40 mg (has no administration in time range)  pantoprazole (PROTONIX) EC tablet 40 mg (40 mg Oral Given 02/18/19 2124)  nitroGLYCERIN (NITROSTAT) SL tablet 0.4 mg (has no administration in time range)  morphine 2 MG/ML injection 2 mg (has no administration in time range)  iohexol (OMNIPAQUE) 350 MG/ML injection 75 mL (65 mLs Intravenous Contrast Given 02/18/19 1534)    Mobility walks Low fall risk   Focused Assessments    R Recommendations: See Admitting Provider Note  Report given to:   Additional Notes:

## 2019-02-18 NOTE — ED Triage Notes (Signed)
Pt arrives via EMS from home with reports of central CP with no radiation. Hx of stent and bypass 324 ASA given

## 2019-02-18 NOTE — ED Provider Notes (Signed)
[facility] EMERGENCY DEPARTMENT Provider Note   CSN: [id number] Arrival date & time: 02/18/19  1250    History   Chief Complaint Chief Complaint  Patient presents with  . Chest Pain   HPI Joshua Higgins is a 73 y.o. male who presents with a 4 hour history of chest pain. Patient states the pain started when he was driving and was in the middle to left side of his chest, not well localized and described as a sharp pain that feels like reflux but is more severe. Pain does not radiate to arms/jaw/neck. Patient denies nausea or vomiting. Patient has no history of stroke, but has history of CABG. Patient has not smoked in the past 90 days and denies any history of treated diabetes.  HPI  Past Medical History:  Diagnosis Date  . Anemia    low iron  . Arthritis   . Cancer ([place])    skin cancer on face  . Cataracts, bilateral   . Coronary artery disease    CABG - 2008  . Diabetes mellitus without complication ([place])    type II  . GERD (gastroesophageal reflux disease)   . Hepatitis   . Hyperlipidemia   . Hypertension    pt states he does not have HTN, takes Atenolol for migraines  . Hypothyroidism   . Migraine headache with aura    takes Atenolol  . Sleep apnea    does not use cpap    Patient Active Problem List   Diagnosis Date Noted  . Skin lesion of right ear 11/12/2018  . Former smoker 05/14/2018  . CKD stage 2 due to type 2 diabetes mellitus ([place]) 02/02/2018  . Overweight (BMI 25.0-29.9) 02/02/2018  . Atherosclerosis of abdominal aorta ([place]) 02/12/2016  . Hypothyroidism 11/12/2015  . CAD s/p CABG 09/29/2015  . Medication management 09/24/2013  . Vitamin D deficiency 05/27/2013  . [name] w/Stage 2 CKD (GFR 73 ml/min)   . Hyperlipidemia associated with type 2 diabetes mellitus ([place])   . Hypertension   . GERD (gastroesophageal reflux disease)   . IBS (irritable bowel syndrome)     Past Surgical History:  Procedure Laterality Date  . BACK  SURGERY    . CARDIAC SURGERY     Bypass  . CATARACT EXTRACTION, BILATERAL Bilateral 2018   Dr. Gershon Crane  . COLONOSCOPY    . CORONARY ANGIOPLASTY  2001  . CORONARY ARTERY BYPASS GRAFT    . ELBOW SURGERY Left    due to damaged nerve  . KNEE SURGERY Right    arthroscopic  . LUMBAR LAMINECTOMY/DECOMPRESSION MICRODISCECTOMY Right 08/04/2014   Procedure: Right Lumbar four-five Microdiskectomy;  Surgeon: Kristeen Miss, MD;  Location: [facility];  Service: Neurosurgery;  Laterality: Right;  Right L4-5 Microdiskectomy  . SURGERY SCROTAL / TESTICULAR     as a child/teenager        Home Medications    Prior to Admission medications   Medication Sig Start Date End Date Taking? Authorizing Provider  acyclovir (ZOVIRAX) 800 MG tablet Take 1 tablet daily for Fever Blister / Mouth Ulcers Patient taking differently: as needed. Take 1 tablet daily for Fever Blister / Mouth Ulcers 07/30/18   Unk Pinto, MD  aspirin EC 81 MG tablet Take 1 tablet (81 mg total) by mouth daily. 02/13/19   Croitoru, Mihai, MD  atenolol (TENORMIN) 50 MG tablet Take 1 tablet (50 mg total) by mouth daily. 08/29/18   Liane Comber, NP  Blood Glucose Monitoring Suppl DEVI Use  kit to check blood sugar once a day 09/03/18   Liane Comber, NP  Cholecalciferol 5000 UNITS TABS Take 1 tablet by mouth daily.    [provider]  ezetimibe (ZETIA) 10 MG tablet Take 1 tablet Daily for E11.69 (Cholesterol) 01/22/19   Unk Pinto, MD  glucose blood (CONTOUR NEXT TEST) test strip Test blood sugar once daily 09/03/18   Liane Comber, NP  IRON CR PO Take 1 tablet by mouth daily.    [provider]  Lancets MISC Check blood sugar once a day 09/03/18   Liane Comber, NP  levothyroxine (SYNTHROID, LEVOTHROID) 50 MCG tablet TAKE 1 TABLET EVERY MORNING ON AN EMPTY STOMACH WITH ONLY WATER FOR 30 MINUTES 07/13/18   Unk Pinto, MD  losartan (COZAAR) 25 MG tablet Take 0.5 tablets (12.5 mg total) by mouth at bedtime.  02/13/19   Croitoru, Mihai, MD  meclizine (ANTIVERT) 25 MG tablet 1/2-1 pill up to 3 times daily for motion sickness/dizziness Patient not taking: Reported on 02/13/2019 01/31/17   Vicie Mutters, PA-C  metFORMIN (GLUCOPHAGE XR) 500 MG 24 hr tablet Take 2 tablets 2 x / day with food for Diabetes 05/27/18   Unk Pinto, MD  Multiple Vitamin (MULTIVITAMIN) tablet Take 1 tablet by mouth at bedtime.     [provider]  Omega-3 Fatty Acids (FISH OIL) 1000 MG CAPS Take 1,000 mg by mouth at bedtime.     [provider]  omeprazole (PRILOSEC) 20 MG capsule Take 20 mg by mouth as needed. OTC about 3 times a week.    [provider]  rosuvastatin (CRESTOR) 10 MG tablet Take 1 tablet twice a week 08/29/18   Liane Comber, NP    Family History Family History  Problem Relation Age of Onset  . Ulcers Mother   . Arthritis Mother   . Heart attack Father   . CAD Sister   . Heart attack Brother   . CAD Brother   . Hypertension Brother   . Diabetes Maternal Grandmother     Social History Social History   Tobacco Use  . Smoking status: Former Smoker    Quit date: 10/01/1973    Years since quitting: 45.4  . Smokeless tobacco: Never Used  Substance Use Topics  . Alcohol use: No  . Drug use: No     Allergies   Gemfibrozil, Ace inhibitors, and Lipitor [atorvastatin]   Review of Systems Review of Systems  Constitutional: Negative for chills and fatigue.  HENT: Negative for congestion.   Respiratory: Negative for cough and shortness of breath.   Cardiovascular: Positive for chest pain.  Gastrointestinal: Negative for abdominal pain, nausea and vomiting.  Genitourinary: Negative for dysuria, frequency and urgency.  All other systems reviewed and are negative.    Physical Exam Updated Vital Signs BP 137/74   Pulse 78   Temp 98.2 F (36.8 C) (Oral)   Resp 19   SpO2 94%   Physical Exam Constitutional:      Appearance: Normal appearance.  HENT:      Head: Normocephalic and atraumatic.     Right Ear: External ear normal.     Left Ear: External ear normal.  Neck:     Musculoskeletal: Neck supple.  Cardiovascular:     Rate and Rhythm: Normal rate and regular rhythm.     Heart sounds: Normal heart sounds. No murmur. No friction rub. No gallop.   Pulmonary:     Breath sounds: Normal breath sounds. No wheezing, rhonchi or rales.  Abdominal:     General: Abdomen is flat. There is no distension.     Palpations: Abdomen is soft.     Tenderness: There is no abdominal tenderness. There is no guarding.  Musculoskeletal:        General: No swelling or tenderness.  Skin:    General: Skin is warm and dry.  Neurological:     Mental Status: He is alert.  Psychiatric:        Mood and Affect: Mood normal.        Behavior: Behavior normal.      ED Treatments / Results  Labs (all labs ordered are listed, but only abnormal results are displayed) Labs Reviewed  D-DIMER, QUANTITATIVE (NOT AT [facility]) - Abnormal; Notable for the following components:      Result Value   D-Dimer, Quant 0.99 (*)    All other components within normal limits  CBC - Abnormal; Notable for the following components:   RBC 3.66 (*)    Hemoglobin 12.7 (*)    HCT 38.4 (*)    MCV 104.9 (*)    MCH 34.7 (*)    All other components within normal limits  BASIC METABOLIC PANEL - Abnormal; Notable for the following components:   Glucose, Bld 168 (*)    All other components within normal limits  TROPONIN I (HIGH SENSITIVITY)  TROPONIN I (HIGH SENSITIVITY)    EKG None  Radiology Dg Chest Portable 1 View  Result Date: 02/18/2019 CLINICAL DATA:  Chest pain and shortness of breath. EXAM: PORTABLE CHEST 1 VIEW COMPARISON:  10/10/2015. FINDINGS: Normal sized heart. Stable post CABG changes. Clear lungs with normal vascularity. Diffuse osteopenia. IMPRESSION: No acute abnormality. Electronically Signed   By: Claudie Revering M.D.   On: 02/18/2019 13:20    Procedures Procedures  (including critical care time)  Medications Ordered in ED Medications - No data to display   Initial Impression / Assessment and Plan / ED Course  I have reviewed the triage vital signs and the nursing notes.  Pertinent labs & imaging results that were available during my care of the patient were reviewed by me and considered in my medical decision making (see chart for details).     HEAR Score: 3  Mr. Churilla ins a 73 year old male who presents with a 4 hour history of chest pain. EKG is without ST elevation and chest pain does not sound cardiac in nature. Due to elevated cardiac risk with history of CABG, serial troponins ordered. First troponin of 5. Given pleuritic sound to chest pain, D-dimer ordered which was elevated to 0.99. CTA ordered.   Final Clinical Impressions(s) / ED Diagnoses   Final diagnoses:  None    ED Discharge Orders    None       Jeanmarie Hubert, MD 02/18/19 1503    Carmin Muskrat, MD 02/21/19 [phone]

## 2019-02-18 NOTE — ED Provider Notes (Addendum)
Care assumed from Dr. Benjamine Mola at 3 PM.  Patient presents with chief complaint of chest pain.  D-dimer elevated.  Awaiting second opponent.  First was normal.  EKG reassuring.  Also awaiting CT PE study.  Will reassess and determine appropriate disposition.  Update: Delta troponins within normal notes.  CT PE study negative for thromboembolic disease.  I reassessed the patient.  He is remained hemodynamically stable and has been pain-free for the last hour or 2.  He has a history of CABG 9 years ago and has not experienced anginal symptoms before then.  I reviewed his symptoms today.  They became severe and were accompanied by episode of diaphoresis.  Overall I think that he is too high risk for discharge at this point and would benefit from continued observation.  Discussed this with hospitalist who is in agreement.  He was subsequently admitted for inpatient management and further work-up of his high risk chest pain.     Tillie Fantasia, MD 02/18/19 Annett Fabian, MD 02/19/19 1005

## 2019-02-18 NOTE — ED Notes (Signed)
[phone] Melody pts daughter, wants a call from RN

## 2019-02-18 NOTE — ED Notes (Signed)
Admitting at bedside 

## 2019-02-18 NOTE — ED Notes (Signed)
Patient transported to CT 

## 2019-02-19 ENCOUNTER — Observation Stay (HOSPITAL_BASED_OUTPATIENT_CLINIC_OR_DEPARTMENT_OTHER): Payer: Medicare Other

## 2019-02-19 DIAGNOSIS — R0789 Other chest pain: Secondary | ICD-10-CM | POA: Diagnosis not present

## 2019-02-19 DIAGNOSIS — R609 Edema, unspecified: Secondary | ICD-10-CM

## 2019-02-19 DIAGNOSIS — R079 Chest pain, unspecified: Secondary | ICD-10-CM | POA: Diagnosis not present

## 2019-02-19 LAB — ECHOCARDIOGRAM COMPLETE

## 2019-02-19 MED ORDER — PANTOPRAZOLE SODIUM 40 MG PO TBEC: 40.0000 mg | DELAYED_RELEASE_TABLET | Freq: Every day | ORAL | 1 refills | Status: DC

## 2019-02-19 NOTE — ED Notes (Signed)
Pt declined morphine to tx epigastric pain.

## 2019-02-19 NOTE — ED Notes (Signed)
Tele Ordered bfast 

## 2019-02-19 NOTE — Progress Notes (Signed)
LE venous duplex       has been completed. Preliminary results can be found under CV proc through chart review. Javaun Dimperio, BS, RDMS, RVT   

## 2019-02-19 NOTE — Discharge Summary (Signed)
Physician Discharge Summary  Joshua Higgins EBR:[id number] DOB: 01-21-1946 DOA: 02/18/2019  PCP: Unk Pinto, MD  Admit date: 02/18/2019 Discharge date: 02/19/2019  Admitted From: Home Disposition:  Home  Discharge Condition:Stable CODE STATUS:FULL Diet recommendation: Heart Healthy   Brief/Interim Summary:  Joshua Higgins is a 73 y.o. male with medical history significant of coronary artery disease status post CABG about 12 years ago, diabetes mellitus, GERD, hyperlipidemia, hypertension, hypothyroidism, migraine headaches who presented to the emergency department for evaluation of severe chest pain.  Patient started having chest at home this morning.  He thought that it was indigestion.  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  He finished maintenance of his car and was driving back to home but it was getting worse.  He describes the pain as sharp on the middle of his chest.  He also gave history of associated shortness of breath with diaphoresis.  He had severe chest pain on taking deep breaths.  He then presented to the emergency department. Patient has history of coronary disease and had CABG done about1 2 years ago.  He follows with cardiology.  Hospital Course:  His hospital course remained stable.  His chest pain resolved after admission.  He was admitted for chest pain rule out.  His troponins were normal.  EKG did not show any ischemic changes.  He underwent echocardiogram which showed normal left ventricle ejection fraction, no wall motion abnormality.  Venous Doppler did not show any clots. Most likely his chest pain was associated with heartburn/GERD.  He is hemodynamically stable for discharge to home today.  Following problems were addressed during his hospitalization:  Chest pain: Atypical chest pain.  Not associated with exertion.  Described the pain as sharp, midsternal.  Currently chest pain-free.  EKG did not show any ischemic  changes.  CT angiogram did not show any significant abnormality.  High-sensitivity troponins normal. Echocardiogram as above.  Coronary artery disease: Follows with cardiology.  Will resume his home medicines.  History of CABG about 12 days ago  Elevated d-dimer: Mild elevated d-dimer.  CT angiogram did not show any pulmonary embolism.  Venous Doppler negative for DVT  Hypertension: Current blood pressure stable.  Continue current medicines.  Hypothyroidism: Continue Synthyroid  CKD stage II: Currently kidney function at baseline  Diabetes type 2: On metformin at home which we will resume.  Last hemoglobin A1C of 6.5.  GERD: Continue Protonix  Hyperlipidemia: On Zetia and Crestor  History of migraines :On atenolol  Discharge Diagnoses:  Principal Problem:   Chest pain Active Problems:   Hyperlipidemia associated with type 2 diabetes mellitus (HCC)   Hypertension   GERD (gastroesophageal reflux disease)   CAD s/p CABG   Hypothyroidism   CKD stage 2 due to type 2 diabetes mellitus ([place])   DM2 (diabetes mellitus, type 2) ([facility])    Discharge Instructions  Discharge Instructions    Diet - low sodium heart healthy   Complete by: As directed    Discharge instructions   Complete by: As directed    1)Please take prescribed medications as instructed. 2)Your chest pain was most likely associated with heart burn. Take protonix daily for 30 days. Avoid spicy and fatty diet. 3)Follow up with your PCP and cardiologist.   Increase activity slowly   Complete by: As directed      Allergies as of 02/19/2019      Reactions   Gemfibrozil Other (See Comments)   Muscle aches  Ace Inhibitors Cough   Lipitor [atorvastatin] Other (See Comments)   Joint pain.      Medication List    STOP taking these medications   omeprazole 20 MG capsule Commonly known as: PRILOSEC     TAKE these medications   acyclovir 800 MG tablet Commonly known as: ZOVIRAX Take 1 tablet daily for  Fever Blister / Mouth Ulcers What changed:   how much to take  how to take this  when to take this  reasons to take this  additional instructions   aspirin EC 81 MG tablet Take 1 tablet (81 mg total) by mouth daily.   atenolol 50 MG tablet Commonly known as: TENORMIN Take 1 tablet (50 mg total) by mouth daily.   Blood Glucose Monitoring Suppl Devi Use kit to check blood sugar once a day   Cholecalciferol 125 MCG (5000 UT) Tabs Take 1 tablet by mouth daily.   ezetimibe 10 MG tablet Commonly known as: ZETIA Take 1 tablet Daily for E11.69 (Cholesterol) What changed:   how much to take  how to take this  when to take this  additional instructions   Fish Oil 1000 MG Caps Take 1,000 mg by mouth at bedtime.   glucose blood test strip Commonly known as: Contour Next Test Test blood sugar once daily   IRON CR PO Take 1 tablet by mouth daily.   Lancets Misc Check blood sugar once a day   levothyroxine 50 MCG tablet Commonly known as: SYNTHROID TAKE 1 TABLET EVERY MORNING ON AN EMPTY STOMACH WITH ONLY WATER FOR 30 MINUTES What changed: See the new instructions.   losartan 25 MG tablet Commonly known as: COZAAR Take 0.5 tablets (12.5 mg total) by mouth at bedtime. What changed: when to take this   metFORMIN 500 MG 24 hr tablet Commonly known as: Glucophage XR Take 2 tablets 2 x / day with food for Diabetes What changed:   how much to take  how to take this  when to take this  additional instructions   multivitamin tablet Take 1 tablet by mouth at bedtime.   pantoprazole 40 MG tablet Commonly known as: PROTONIX Take 1 tablet (40 mg total) by mouth daily. Start taking on: February 20, 2019   rosuvastatin 20 MG tablet Commonly known as: CRESTOR Take 20 mg by mouth 2 (two) times a week.      Follow-up Information    Unk Pinto, MD. Schedule an appointment as soon as possible for a visit in 1 week(s).   Specialty: Internal  Medicine Contact information: [address] [place] [address] [phone]          Allergies  Allergen Reactions  . Gemfibrozil Other (See Comments)    Muscle aches  . Ace Inhibitors Cough  . Lipitor [Atorvastatin] Other (See Comments)    Joint pain.    Consultations:  None   Procedures/Studies: Ct Angio Chest Pe W And/or Wo Contrast  Result Date: 02/18/2019 CLINICAL DATA:  73 year old male with acute chest pain. EXAM: CT ANGIOGRAPHY CHEST WITH CONTRAST TECHNIQUE: Multidetector CT imaging of the chest was performed using the standard protocol during bolus administration of intravenous contrast. Multiplanar CT image reconstructions and MIPs were obtained to evaluate the vascular anatomy. CONTRAST:  53m OMNIPAQUE IOHEXOL 350 MG/ML SOLN COMPARISON:  02/18/2019 and prior chest radiographs FINDINGS: Cardiovascular: This is a technically satisfactory study. No pulmonary emboli are identified. Cardiomegaly and CABG changes noted. Coronary artery and aortic atherosclerotic calcifications are present. There is no evidence  of thoracic aortic aneurysm or pericardial effusion. Mediastinum/Nodes: No enlarged mediastinal, hilar, or axillary lymph nodes. Thyroid gland, trachea, and esophagus demonstrate no significant findings. Lungs/Pleura: There is no evidence of airspace disease, consolidation, mass, pleural effusion or pneumothorax. Upper Abdomen: No acute abnormality Musculoskeletal: No acute or suspicious bony abnormalities. Review of the MIP images confirms the above findings. IMPRESSION: 1. No acute abnormality.  No evidence of pulmonary emboli. 2. Cardiomegaly and CABG changes 3.  Aortic Atherosclerosis (ICD10-I70.0). Electronically Signed   By: Margarette Canada M.D.   On: 02/18/2019 15:45   Dg Chest Portable 1 View  Result Date: 02/18/2019 CLINICAL DATA:  Chest pain and shortness of breath. EXAM: PORTABLE CHEST 1 VIEW COMPARISON:  10/10/2015. FINDINGS: Normal sized heart.  Stable post CABG changes. Clear lungs with normal vascularity. Diffuse osteopenia. IMPRESSION: No acute abnormality. Electronically Signed   By: Claudie Revering M.D.   On: 02/18/2019 13:20   Vas [place] Lower Extremity Venous (dvt)  Result Date: 02/19/2019  Lower Venous Study Indications: Edema.  Comparison Study: no prior Performing Technologist: June Leap RDMS, RVT  Examination Guidelines: A complete evaluation includes B-mode imaging, spectral Doppler, color Doppler, and power Doppler as needed of all accessible portions of each vessel. Bilateral testing is considered an integral part of a complete examination. Limited examinations for reoccurring indications may be performed as noted.  +---------+---------------+---------+-----------+----------+-------+ RIGHT    CompressibilityPhasicitySpontaneityPropertiesSummary +---------+---------------+---------+-----------+----------+-------+ CFV      Full           Yes      Yes                          +---------+---------------+---------+-----------+----------+-------+ SFJ      Full                                                 +---------+---------------+---------+-----------+----------+-------+ FV Prox  Full                                                 +---------+---------------+---------+-----------+----------+-------+ FV Mid   Full                                                 +---------+---------------+---------+-----------+----------+-------+ FV DistalFull                                                 +---------+---------------+---------+-----------+----------+-------+ PFV      Full                                                 +---------+---------------+---------+-----------+----------+-------+ POP      Full           Yes      Yes                          +---------+---------------+---------+-----------+----------+-------+  PTV      Full                                                  +---------+---------------+---------+-----------+----------+-------+ PERO     Full                                                 +---------+---------------+---------+-----------+----------+-------+   +---------+---------------+---------+-----------+----------+-------+ LEFT     CompressibilityPhasicitySpontaneityPropertiesSummary +---------+---------------+---------+-----------+----------+-------+ CFV      Full           Yes      Yes                          +---------+---------------+---------+-----------+----------+-------+ SFJ      Full                                                 +---------+---------------+---------+-----------+----------+-------+ FV Prox  Full                                                 +---------+---------------+---------+-----------+----------+-------+ FV Mid   Full                                                 +---------+---------------+---------+-----------+----------+-------+ FV DistalFull                                                 +---------+---------------+---------+-----------+----------+-------+ PFV      Full                                                 +---------+---------------+---------+-----------+----------+-------+ POP      Full           Yes      Yes                          +---------+---------------+---------+-----------+----------+-------+ PTV      Full                                                 +---------+---------------+---------+-----------+----------+-------+ PERO     Full                                                 +---------+---------------+---------+-----------+----------+-------+  Summary: Right: There is no evidence of deep vein thrombosis in the lower extremity. No cystic structure found in the popliteal fossa. Left: There is no evidence of deep vein thrombosis in the lower extremity. No cystic structure found in the popliteal fossa.  *See table(s) above for  measurements and observations.    Preliminary        Subjective:  Patient seen and examined at the bedside this morning.  Currently chest pain-free.  Hemodynamically stable for discharge.  Discharge Exam: Vitals:   02/19/19 0847 02/19/19 0900  BP: 127/69 126/64  Pulse: 80 92  Resp: 18 (!) 22  Temp:    SpO2: 95% 96%   Vitals:   02/19/19 0500 02/19/19 0600 02/19/19 0847 02/19/19 0900  BP: 132/70 128/71 127/69 126/64  Pulse: 78 71 80 92  Resp: '17 16 18 '$ (!) 22  Temp:      TempSrc:      SpO2: 97% 95% 95% 96%    General: Pt is alert, awake, not in acute distress Cardiovascular: RRR, S1/S2 +, no rubs, no gallops Respiratory: CTA bilaterally, no wheezing, no rhonchi Abdominal: Soft, NT, ND, bowel sounds + Extremities: no edema, no cyanosis    The results of significant diagnostics from this hospitalization (including imaging, microbiology, ancillary and laboratory) are listed below for reference.     Microbiology: Recent Results (from the past 240 hour(s))  SARS CORONAVIRUS 2 Nasal Swab Aptima Multi Swab     Status: None   Collection Time: 02/18/19  5:01 PM   Specimen: Aptima Multi Swab; Nasal Swab  Result Value Ref Range Status   SARS Coronavirus 2 NEGATIVE NEGATIVE Final    Comment: (NOTE) SARS-CoV-2 target nucleic acids are NOT DETECTED. The SARS-CoV-2 RNA is generally detectable in upper and lower respiratory specimens during the acute phase of infection. Negative results do not preclude SARS-CoV-2 infection, do not rule out co-infections with other pathogens, and should not be used as the sole basis for treatment or other patient management decisions. Negative results must be combined with clinical observations, patient history, and epidemiological information. The expected result is Negative. Fact Sheet for Patients: SugarRoll.be Fact Sheet for Healthcare Providers: https://www.woods-mathews.com/ This test is not yet  approved or cleared by the [place] FDA and  has been authorized for detection and/or diagnosis of SARS-CoV-2 by FDA under an Emergency Use Authorization (EUA). This EUA will remain  in effect (meaning this test can be used) for the duration of the COVID-19 declaration under Section 56 4(b)(1) of the Act, 21 U.S.C. section 360bbb-3(b)(1), unless the authorization is terminated or revoked sooner. Performed at [facility], [place] [address] [place], [place] [address]      Labs: BNP (last 3 results) No results for input(s): BNP in the last 8760 hours. Basic Metabolic Panel: Recent Labs  Lab 02/18/19 1303  NA 138  K 4.3  CL 100  CO2 26  GLUCOSE 168*  BUN 12  CREATININE 1.17  CALCIUM 9.1   Liver Function Tests: No results for input(s): AST, ALT, ALKPHOS, BILITOT, PROT, ALBUMIN in the last 168 hours. No results for input(s): LIPASE, AMYLASE in the last 168 hours. No results for input(s): AMMONIA in the last 168 hours. CBC: Recent Labs  Lab 02/18/19 1303  WBC 6.8  HGB 12.7*  HCT 38.4*  MCV 104.9*  PLT 176   Cardiac Enzymes: No results for input(s): CKTOTAL, CKMB, CKMBINDEX, TROPONINI in the last 168 hours. BNP: Invalid input(s): POCBNP CBG: No results for input(s): GLUCAP  in the last 168 hours. D-Dimer Recent Labs    02/18/19 1303  DDIMER 0.99*   Hgb A1c No results for input(s): HGBA1C in the last 72 hours. Lipid Profile No results for input(s): CHOL, HDL, LDLCALC, TRIG, CHOLHDL, LDLDIRECT in the last 72 hours. Thyroid function studies No results for input(s): TSH, T4TOTAL, T3FREE, THYROIDAB in the last 72 hours.  Invalid input(s): FREET3 Anemia work up No results for input(s): VITAMINB12, FOLATE, FERRITIN, TIBC, IRON, RETICCTPCT in the last 72 hours. Urinalysis    Component Value Date/Time   COLORURINE DARK YELLOW 12/06/2018 1520   APPEARANCEUR CLEAR 12/06/2018 1520   LABSPEC 1.024 12/06/2018 1520   PHURINE < OR = 5.0 12/06/2018 1520    GLUCOSEU NEGATIVE 12/06/2018 1520   HGBUR NEGATIVE 12/06/2018 1520   BILIRUBINUR NEGATIVE 07/11/2016 1426   KETONESUR TRACE (A) 12/06/2018 1520   PROTEINUR NEGATIVE 12/06/2018 1520   UROBILINOGEN 1.0 05/31/2007 0835   NITRITE NEGATIVE 12/06/2018 1520   LEUKOCYTESUR NEGATIVE 12/06/2018 1520   Sepsis Labs Invalid input(s): PROCALCITONIN,  WBC,  LACTICIDVEN Microbiology Recent Results (from the past 240 hour(s))  SARS CORONAVIRUS 2 Nasal Swab Aptima Multi Swab     Status: None   Collection Time: 02/18/19  5:01 PM   Specimen: Aptima Multi Swab; Nasal Swab  Result Value Ref Range Status   SARS Coronavirus 2 NEGATIVE NEGATIVE Final    Comment: (NOTE) SARS-CoV-2 target nucleic acids are NOT DETECTED. The SARS-CoV-2 RNA is generally detectable in upper and lower respiratory specimens during the acute phase of infection. Negative results do not preclude SARS-CoV-2 infection, do not rule out co-infections with other pathogens, and should not be used as the sole basis for treatment or other patient management decisions. Negative results must be combined with clinical observations, patient history, and epidemiological information. The expected result is Negative. Fact Sheet for Patients: SugarRoll.be Fact Sheet for Healthcare Providers: https://www.woods-mathews.com/ This test is not yet approved or cleared by the [place] FDA and  has been authorized for detection and/or diagnosis of SARS-CoV-2 by FDA under an Emergency Use Authorization (EUA). This EUA will remain  in effect (meaning this test can be used) for the duration of the COVID-19 declaration under Section 56 4(b)(1) of the Act, 21 U.S.C. section 360bbb-3(b)(1), unless the authorization is terminated or revoked sooner. Performed at [facility], [place] [address], [place], [place] [address]     Please note: You were cared for by a hospitalist during your hospital stay. Once  you are discharged, your primary care physician will handle any further medical issues. Please note that NO REFILLS for any discharge medications will be authorized once you are discharged, as it is imperative that you return to your primary care physician (or establish a relationship with a primary care physician if you do not have one) for your post hospital discharge needs so that they can reassess your need for medications and monitor your lab values.    Time coordinating discharge: 40 minutes  SIGNED:   Shelly Coss, MD  [facility] 02/19/2019, 12:53 PM Pager [phone]  If 7PM-7AM, please contact night-coverage [email or web address] Password [email or web address]

## 2019-02-19 NOTE — Progress Notes (Signed)
  Echocardiogram 2D Echocardiogram has been performed.  Joshua Higgins 02/19/2019, 10:02 AM

## 2019-02-19 NOTE — ED Notes (Signed)
AVS given to the patient, discharge education provided to patient and spouse, medication reviewed and instruction to pick up from pharmacy.  No further questions at this time.

## 2019-02-20 ENCOUNTER — Telehealth: Payer: Self-pay | Admitting: *Deleted

## 2019-02-20 NOTE — Telephone Encounter (Signed)
Called patient on 02/20/2019 , 11:00 AM in an attempt to reach the patient for a hospital follow up.   Admit date: 02/18/19 Discharge: 02/19/19   He does not have any questions or concerns about medications from the hospital admission. The patient's medications were reviewed over the phone, they were counseled to bring in all current medications to the hospital follow up visit. Patient has an appointment 02/21/2019 wih Caryl Pina Corbett,NP.  I advised the patient to call if any questions or concerns arise about the hospital admission or medications    Home health was not started in the hospital.  All questions were answered and a follow up appointment was made.   Prior to Admission medications   Medication Sig Start Date End Date Taking? Authorizing Provider  acyclovir (ZOVIRAX) 800 MG tablet Take 1 tablet daily for Fever Blister / Mouth Ulcers Patient taking differently: Take 800 mg by mouth as needed. For Fever Blister / Mouth Ulcers 07/30/18   Unk Pinto, MD  aspirin EC 81 MG tablet Take 1 tablet (81 mg total) by mouth daily. 02/13/19   Croitoru, Mihai, MD  atenolol (TENORMIN) 50 MG tablet Take 1 tablet (50 mg total) by mouth daily. 08/29/18   Liane Comber, NP  Blood Glucose Monitoring Suppl DEVI Use kit to check blood sugar once a day 09/03/18   Liane Comber, NP  Cholecalciferol 5000 UNITS TABS Take 1 tablet by mouth daily.    [provider]  ezetimibe (ZETIA) 10 MG tablet Take 1 tablet Daily for E11.69 (Cholesterol) Patient taking differently: Take 10 mg by mouth daily.  01/22/19   Unk Pinto, MD  glucose blood (CONTOUR NEXT TEST) test strip Test blood sugar once daily 09/03/18   Liane Comber, NP  IRON CR PO Take 1 tablet by mouth daily.    [provider]  Lancets MISC Check blood sugar once a day 09/03/18   Liane Comber, NP  levothyroxine (SYNTHROID, LEVOTHROID) 50 MCG tablet TAKE 1 TABLET EVERY MORNING ON AN EMPTY STOMACH WITH ONLY WATER FOR 30  MINUTES Patient taking differently: Take 50 mcg by mouth daily. Take 1 tablet every morning on an empty stomach with only water for 30 minutes 07/13/18   Unk Pinto, MD  losartan (COZAAR) 25 MG tablet Take 0.5 tablets (12.5 mg total) by mouth at bedtime. Patient taking differently: Take 12.5 mg by mouth daily.  02/13/19   Croitoru, Mihai, MD  metFORMIN (GLUCOPHAGE XR) 500 MG 24 hr tablet Take 2 tablets 2 x / day with food for Diabetes Patient taking differently: Take 1,000 mg by mouth 2 (two) times daily.  05/27/18   Unk Pinto, MD  Multiple Vitamin (MULTIVITAMIN) tablet Take 1 tablet by mouth at bedtime.     [provider]  Omega-3 Fatty Acids (FISH OIL) 1000 MG CAPS Take 1,000 mg by mouth at bedtime.     [provider]  pantoprazole (PROTONIX) 40 MG tablet Take 1 tablet (40 mg total) by mouth daily. 02/20/19   Shelly Coss, MD  rosuvastatin (CRESTOR) 20 MG tablet Take 20 mg by mouth 2 (two) times a week.    [provider]

## 2019-02-20 NOTE — Telephone Encounter (Signed)
I have attempted to contact this patient by phone with the following results: left message to return my call on answering machine.

## 2019-02-21 ENCOUNTER — Ambulatory Visit (INDEPENDENT_AMBULATORY_CARE_PROVIDER_SITE_OTHER): Payer: Medicare Other | Admitting: Adult Health

## 2019-02-21 ENCOUNTER — Other Ambulatory Visit: Payer: Self-pay

## 2019-02-21 ENCOUNTER — Encounter: Payer: Self-pay | Admitting: Adult Health

## 2019-02-21 VITALS — Temp 96.6°F | Ht 69.0 in | Wt 180.0 lb

## 2019-02-21 DIAGNOSIS — I25810 Atherosclerosis of coronary artery bypass graft(s) without angina pectoris: Secondary | ICD-10-CM

## 2019-02-21 DIAGNOSIS — R079 Chest pain, unspecified: Secondary | ICD-10-CM | POA: Diagnosis not present

## 2019-02-21 DIAGNOSIS — Z23 Encounter for immunization: Secondary | ICD-10-CM | POA: Diagnosis not present

## 2019-02-21 DIAGNOSIS — K219 Gastro-esophageal reflux disease without esophagitis: Secondary | ICD-10-CM

## 2019-02-21 NOTE — Patient Instructions (Signed)
Continue pantoprazole 40 mg daily for 2 weeks then try switching back to omeprazole 20 mg daily      Gastroesophageal Reflux Disease, Adult Gastroesophageal reflux (GER) happens when acid from the stomach flows up into the tube that connects the mouth and the stomach (esophagus). Normally, food travels down the esophagus and stays in the stomach to be digested. With GER, food and stomach acid sometimes move back up into the esophagus. You may have a disease called gastroesophageal reflux disease (GERD) if the reflux:  Happens often.  Causes frequent or very bad symptoms.  Causes problems such as damage to the esophagus. When this happens, the esophagus becomes sore and swollen (inflamed). Over time, GERD can make small holes (ulcers) in the lining of the esophagus. What are the causes? This condition is caused by a problem with the muscle between the esophagus and the stomach. When this muscle is weak or not normal, it does not close properly to keep food and acid from coming back up from the stomach. The muscle can be weak because of:  Tobacco use.  Pregnancy.  Having a certain type of hernia (hiatal hernia).  Alcohol use.  Certain foods and drinks, such as coffee, chocolate, onions, and peppermint. What increases the risk? You are more likely to develop this condition if you:  Are overweight.  Have a disease that affects your connective tissue.  Use NSAID medicines. What are the signs or symptoms? Symptoms of this condition include:  Heartburn.  Difficult or painful swallowing.  The feeling of having a lump in the throat.  A bitter taste in the mouth.  Bad breath.  Having a lot of saliva.  Having an upset or bloated stomach.  Belching.  Chest pain. Different conditions can cause chest pain. Make sure you see your doctor if you have chest pain.  Shortness of breath or noisy breathing (wheezing).  Ongoing (chronic) cough or a cough at night.  Wearing away  of the surface of teeth (tooth enamel).  Weight loss. How is this treated? Treatment will depend on how bad your symptoms are. Your doctor may suggest:  Changes to your diet.  Medicine.  Surgery. Follow these instructions at home: Eating and drinking   Follow a diet as told by your doctor. You may need to avoid foods and drinks such as: ? Coffee and tea (with or without caffeine). ? Drinks that contain alcohol. ? Energy drinks and sports drinks. ? Bubbly (carbonated) drinks or sodas. ? Chocolate and cocoa. ? Peppermint and mint flavorings. ? Garlic and onions. ? Horseradish. ? Spicy and acidic foods. These include peppers, chili powder, curry powder, vinegar, hot sauces, and BBQ sauce. ? Citrus fruit juices and citrus fruits, such as oranges, lemons, and limes. ? Tomato-based foods. These include red sauce, chili, salsa, and pizza with red sauce. ? Fried and fatty foods. These include donuts, french fries, potato chips, and high-fat dressings. ? High-fat meats. These include hot dogs, rib eye steak, sausage, ham, and bacon. ? High-fat dairy items, such as whole milk, butter, and cream cheese.  Eat small meals often. Avoid eating large meals.  Avoid drinking large amounts of liquid with your meals.  Avoid eating meals during the 2-3 hours before bedtime.  Avoid lying down right after you eat.  Do not exercise right after you eat. Lifestyle   Do not use any products that contain nicotine or tobacco. These include cigarettes, e-cigarettes, and chewing tobacco. If you need help quitting, ask your doctor.  Try to lower your stress. If you need help doing this, ask your doctor.  If you are overweight, lose an amount of weight that is healthy for you. Ask your doctor about a safe weight loss goal. General instructions  Pay attention to any changes in your symptoms.  Take over-the-counter and prescription medicines only as told by your doctor. Do not take aspirin,  ibuprofen, or other NSAIDs unless your doctor says it is okay.  Wear loose clothes. Do not wear anything tight around your waist.  Raise (elevate) the head of your bed about 6 inches (15 cm).  Avoid bending over if this makes your symptoms worse.  Keep all follow-up visits as told by your doctor. This is important. Contact a doctor if:  You have new symptoms.  You lose weight and you do not know why.  You have trouble swallowing or it hurts to swallow.  You have wheezing or a cough that keeps happening.  Your symptoms do not get better with treatment.  You have a hoarse voice. Get help right away if:  You have pain in your arms, neck, jaw, teeth, or back.  You feel sweaty, dizzy, or light-headed.  You have chest pain or shortness of breath.  You throw up (vomit) and your throw-up looks like blood or coffee grounds.  You pass out (faint).  Your poop (stool) is bloody or black.  You cannot swallow, drink, or eat. Summary  If a person has gastroesophageal reflux disease (GERD), food and stomach acid move back up into the esophagus and cause symptoms or problems such as damage to the esophagus.  Treatment will depend on how bad your symptoms are.  Follow a diet as told by your doctor.  Take all medicines only as told by your doctor. This information is not intended to replace advice given to you by your health care provider. Make sure you discuss any questions you have with your health care provider. Document Released: 12/07/2007 Document Revised: 12/27/2017 Document Reviewed: 12/27/2017 Elsevier Patient Education  2020 [employer].

## 2019-02-21 NOTE — Progress Notes (Signed)
Hospital follow up  Assessment and Plan: Hospital visit follow up for: chest pain  Chest pain, unspecified type Resolved; attributed to GERD  Coronary artery disease involving coronary bypass graft of native heart without angina pectoris Cardiac workup unremarkable;  Control blood pressure, cholesterol, glucose, increase exercise. - currently aggressively controlled overall Continue follow up with cardiology as recommended  Gastroesophageal reflux disease, esophagitis presence not specified No recurrence since discharge; continue pantoprazole 40 mg daily x 2 weeks then resume previous omeprazole 20 mg daily and monitor  Discussed diet, avoiding triggers and other lifestyle changes   All medications were reviewed with patient and family and fully reconciled. All questions answered fully, and patient and family members were encouraged to call the office with any further questions or concerns. Discussed goal to avoid readmission related to this diagnosis.   There are no discontinued medications.  Over 40 minutes of exam, counseling, chart review, and complex, high/moderate level critical decision making was performed this visit.   Future Appointments  Date Time Provider [place]  03/12/2019  9:30 AM Liane Comber, NP [facility] None  06/18/2019  9:30 AM Unk Pinto, MD [facility] None  09/19/2019 10:00 AM Liane Comber, NP [facility] None  12/24/2019  3:00 PM Unk Pinto, MD [facility] None     HPI 73 y.o.male presents for follow up for transition from recent hospitalization or [facility] stay. Admit date to the hospital was 02/18/19, patient was discharged from the hospital on 02/19/19 and our clinical staff contacted the office the day after discharge to set up a follow up appointment. The discharge summary, medications, and diagnostic test results were reviewed before meeting with the patient. The patient was admitted for: chest pain  Joshua Higgins a 73  y.o.malewith medical history significant ofcoronary artery disease status post CABG about12yearsago, diabetes mellitus, GERD, hyperlipidemia, hypertension, hypothyroidism, migraine headaches who presented to the emergency department for evaluation of severe chest pain. Patient started having chest at home that morning. 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 He finished maintenance of his car and was driving backto home but it was getting worse.He describes the pain as sharp on the middle of his chest. He also gave history of associated shortness of breath with diaphoresis. He had severe chest pain on taking deep breaths. He then presented to the emergency department. Patient has history of coronary disease and had CABG done about 12 years ago. He follows with cardiology.  Hospital Course:  His hospital course remained stable.  His chest pain resolved after admission.  He was admitted for chest pain rule out.  His troponins were normal.  EKG did not show any ischemic changes.  He underwent echocardiogram which showed normal left ventricle ejection fraction, no wall motion abnormality.  Venous Doppler did not show any clots. Was concluded most likely his chest pain was associated with heartburn/GERD and was discharged in stable condition.   Following problems were addressed during his hospitalization:  Chest pain: Atypical chest pain.Not associatedwith exertion. Described the pain as sharp, midsternal.EKG did not show any ischemic changes. CT angiogram did not show any significant abnormality. High-sensitivity troponins normal. Echocardiogram essentially normal with EF of 55-60%.   Coronary artery disease: Follows with cardiology. Resumed his home medicines. History of CABG about 12 years ago. Was advised to follow up with cardiology.   Elevated d-dimer:Mild elevated d-dimer. CT angiogram did not  show any pulmonary embolism. Venous Doppler negative for DVT  GERD: He was switched from omeprazole 20 mg daily to pantoprazole 40 mg daily. He reports no further pain, or any other symptoms since discharge. Was instructed to transition back to omeprazole 20 mg after a few weeks.    His blood pressure has been controlled at home (120s-130s/60-80s), today their BP is 134/74. He does workout. He denies chest pain, shortness of breath, dizziness.   He is on cholesterol medication and denies myalgias. His cholesterol is at goal. The cholesterol last visit was:   Lab Results  Component Value Date   CHOL 110 12/06/2018   HDL 46 12/06/2018   LDLCALC 43 12/06/2018   TRIG 130 12/06/2018   CHOLHDL 2.4 12/06/2018     Home health is not involved.   Images while in the hospital: Ct Angio Chest Pe W And/or Wo Contrast  Result Date: 02/18/2019 CLINICAL DATA:  73 year old male with acute chest pain. EXAM: CT ANGIOGRAPHY CHEST WITH CONTRAST TECHNIQUE: Multidetector CT imaging of the chest was performed using the standard protocol during bolus administration of intravenous contrast. Multiplanar CT image reconstructions and MIPs were obtained to evaluate the vascular anatomy. CONTRAST:  6m OMNIPAQUE IOHEXOL 350 MG/ML SOLN COMPARISON:  02/18/2019 and prior chest radiographs FINDINGS: Cardiovascular: This is a technically satisfactory study. No pulmonary emboli are identified. Cardiomegaly and CABG changes noted. Coronary artery and aortic atherosclerotic calcifications are present. There is no evidence of thoracic aortic aneurysm or pericardial effusion. Mediastinum/Nodes: No enlarged mediastinal, hilar, or axillary lymph nodes. Thyroid gland, trachea, and esophagus demonstrate no significant findings. Lungs/Pleura: There is no evidence of airspace disease, consolidation, mass, pleural effusion or pneumothorax. Upper Abdomen: No acute abnormality Musculoskeletal: No acute or suspicious bony abnormalities.  Review of the MIP images confirms the above findings. IMPRESSION: 1. No acute abnormality.  No evidence of pulmonary emboli. 2. Cardiomegaly and CABG changes 3.  Aortic Atherosclerosis (ICD10-I70.0). Electronically Signed   By: JMargarette CanadaM.D.   On: 02/18/2019 15:45   Dg Chest Portable 1 View  Result Date: 02/18/2019 CLINICAL DATA:  Chest pain and shortness of breath. EXAM: PORTABLE CHEST 1 VIEW COMPARISON:  10/10/2015. FINDINGS: Normal sized heart. Stable post CABG changes. Clear lungs with normal vascularity. Diffuse osteopenia. IMPRESSION: No acute abnormality. Electronically Signed   By: SClaudie ReveringM.D.   On: 02/18/2019 13:20   Vas [place] Extremity Venous (dvt)  Result Date: 02/19/2019  Lower Venous Study Indications: Edema.  Comparison Study: no prior Performing Technologist: JJune LeapRDMS, RVT  Examination Guidelines: A complete evaluation includes B-mode imaging, spectral Doppler, color Doppler, and power Doppler as needed of all accessible portions of each vessel. Bilateral testing is considered an integral part of a complete examination. Limited examinations for reoccurring indications may be performed as noted.  +---------+---------------+---------+-----------+----------+-------+ RIGHT    CompressibilityPhasicitySpontaneityPropertiesSummary +---------+---------------+---------+-----------+----------+-------+ CFV      Full           Yes      Yes                          +---------+---------------+---------+-----------+----------+-------+ SFJ      Full                                                 +---------+---------------+---------+-----------+----------+-------+ FV Prox  Full                                                 +---------+---------------+---------+-----------+----------+-------+  FV Mid   Full                                                 +---------+---------------+---------+-----------+----------+-------+ FV DistalFull                                                  +---------+---------------+---------+-----------+----------+-------+ PFV      Full                                                 +---------+---------------+---------+-----------+----------+-------+ POP      Full           Yes      Yes                          +---------+---------------+---------+-----------+----------+-------+ PTV      Full                                                 +---------+---------------+---------+-----------+----------+-------+ PERO     Full                                                 +---------+---------------+---------+-----------+----------+-------+   +---------+---------------+---------+-----------+----------+-------+ LEFT     CompressibilityPhasicitySpontaneityPropertiesSummary +---------+---------------+---------+-----------+----------+-------+ CFV      Full           Yes      Yes                          +---------+---------------+---------+-----------+----------+-------+ SFJ      Full                                                 +---------+---------------+---------+-----------+----------+-------+ FV Prox  Full                                                 +---------+---------------+---------+-----------+----------+-------+ FV Mid   Full                                                 +---------+---------------+---------+-----------+----------+-------+ FV DistalFull                                                 +---------+---------------+---------+-----------+----------+-------+ PFV  Full                                                 +---------+---------------+---------+-----------+----------+-------+ POP      Full           Yes      Yes                          +---------+---------------+---------+-----------+----------+-------+ PTV      Full                                                  +---------+---------------+---------+-----------+----------+-------+ PERO     Full                                                 +---------+---------------+---------+-----------+----------+-------+     Summary: Right: There is no evidence of deep vein thrombosis in the lower extremity. No cystic structure found in the popliteal fossa. Left: There is no evidence of deep vein thrombosis in the lower extremity. No cystic structure found in the popliteal fossa.  *See table(s) above for measurements and observations. Electronically signed by Deitra Mayo MD on 02/19/2019 at 3:32:04 PM.    Final      Current Outpatient Medications (Endocrine & Metabolic):  .  levothyroxine (SYNTHROID, LEVOTHROID) 50 MCG tablet, TAKE 1 TABLET EVERY MORNING ON AN EMPTY STOMACH WITH ONLY WATER FOR 30 MINUTES (Patient taking differently: Take 50 mcg by mouth daily. Take 1 tablet every morning on an empty stomach with only water for 30 minutes) .  metFORMIN (GLUCOPHAGE XR) 500 MG 24 hr tablet, Take 2 tablets 2 x / day with food for Diabetes (Patient taking differently: Take 1,000 mg by mouth 2 (two) times daily. )  Current Outpatient Medications (Cardiovascular):  .  atenolol (TENORMIN) 50 MG tablet, Take 1 tablet (50 mg total) by mouth daily. Marland Kitchen  ezetimibe (ZETIA) 10 MG tablet, Take 1 tablet Daily for E11.69 (Cholesterol) (Patient taking differently: Take 10 mg by mouth daily. ) .  losartan (COZAAR) 25 MG tablet, Take 0.5 tablets (12.5 mg total) by mouth at bedtime. (Patient taking differently: Take 12.5 mg by mouth daily. ) .  rosuvastatin (CRESTOR) 20 MG tablet, Take 20 mg by mouth 2 (two) times a week.   Current Outpatient Medications (Analgesics):  .  aspirin EC 81 MG tablet, Take 1 tablet (81 mg total) by mouth daily.  Current Outpatient Medications (Hematological):  Marland Kitchen  IRON CR PO, Take 1 tablet by mouth daily.  Current Outpatient Medications (Other):  .  acyclovir (ZOVIRAX) 800 MG tablet, Take 1  tablet daily for Fever Blister / Mouth Ulcers (Patient taking differently: Take 800 mg by mouth as needed. For Fever Blister / Mouth Ulcers) .  Blood Glucose Monitoring Suppl DEVI, Use kit to check blood sugar once a day .  Cholecalciferol 5000 UNITS TABS, Take 1 tablet by mouth daily. Marland Kitchen  glucose blood (CONTOUR NEXT TEST) test strip, Test blood sugar once daily .  Lancets MISC, Check blood sugar once a day .  Multiple  Vitamin (MULTIVITAMIN) tablet, Take 1 tablet by mouth at bedtime.  .  Omega-3 Fatty Acids (FISH OIL) 1000 MG CAPS, Take 1,000 mg by mouth at bedtime.  .  pantoprazole (PROTONIX) 40 MG tablet, Take 1 tablet (40 mg total) by mouth daily.  Past Medical History:  Diagnosis Date  . Anemia    low iron  . Arthritis   . Cancer ([place])    skin cancer on face  . Cataracts, bilateral   . Coronary artery disease    CABG - 2008  . Diabetes mellitus without complication ([place])    type II  . GERD (gastroesophageal reflux disease)   . Hepatitis   . Hyperlipidemia   . Hypertension    pt states he does not have HTN, takes Atenolol for migraines  . Hypothyroidism   . Migraine headache with aura    takes Atenolol  . Sleep apnea    does not use cpap     Allergies  Allergen Reactions  . Gemfibrozil Other (See Comments)    Muscle aches  . Ace Inhibitors Cough  . Lipitor [Atorvastatin] Other (See Comments)    Joint pain.    ROS: all negative except above.   Physical Exam: Filed Weights   02/21/19 0952  Weight: 180 lb (81.6 kg)   Temp (!) 96.6 F (35.9 C)   Ht '5\' 9"'$  (1.753 m)   Wt 180 lb (81.6 kg)   SpO2 98%   BMI 26.58 kg/m  General Appearance: Well nourished, in no apparent distress. Eyes: PERRLA, EOMs, conjunctiva no swelling or erythema Sinuses: No Frontal/maxillary tenderness ENT/Mouth: Ext aud canals clear, TMs without erythema, bulging. No erythema, swelling, or exudate on post pharynx.  Tonsils not swollen or erythematous. Hearing normal.  Neck: Supple, thyroid  normal.  Respiratory: Respiratory effort normal, BS equal bilaterally without rales, rhonchi, wheezing or stridor.  Cardio: RRR with no MRGs. Brisk peripheral pulses without edema.  Abdomen: Soft, + BS.  Non tender, no guarding, rebound, hernias, masses. Lymphatics: Non tender without lymphadenopathy.  Musculoskeletal: Full ROM, 5/5 strength, normal gait.  Skin: Warm, dry without rashes, lesions, ecchymosis.  Neuro: Cranial nerves intact. Normal muscle tone, no cerebellar symptoms. Sensation intact.  Psych: Awake and oriented X 3, normal affect, Insight and Judgment appropriate.     Izora Ribas, NP 10:32 AM [facility]

## 2019-02-25 ENCOUNTER — Other Ambulatory Visit: Payer: Self-pay | Admitting: Adult Health

## 2019-03-06 NOTE — Progress Notes (Signed)
FOLLOW UP  Assessment and Plan:   Essential hypertension Well controlled; continue medication Monitor blood pressure at home; call if consistently over 130/80 Continue DASH diet.   Reminder to go to the ER if any CP, SOB, nausea, dizziness, severe HA, changes vision/speech, left arm numbness and tingling and jaw pain.  Coronary artery disease involving coronary bypass graft of native heart, Other angina ([place]) very infrequent CCS class II exertional angina pectoris; continue BB; followed by cardiology (Dr. Sallyanne Kuster) Unremarkable CTA in 02/2019 Continue ASA, close management of cholesterol   Gastroesophageal reflux disease, esophagitis presence not specified Off of protonix and reports no symptoms at this time off of medications Will send in famotidine to take BID PRN  Discussed diet, avoiding triggers and other lifestyle changes  Type 2 diabetes mellitus with stage 2 chronic kidney disease, without long-term current use of insulin (HCC) Continue metformin Continue diet and exercise.  Perform daily foot/skin check, notify office of any concerning changes.  -     Hemoglobin A1c  Hypothyroidism, unspecified type Continue synthroid reminded to take on an empty stomach 30-54mns before food.  -     TSH  Mixed hyperlipidemia Tolerating low dose rosuvastatin; treated by zetia, omega 3, very aggressive control on last check, if similar discussed stopping zetia LDL goal <70 Continue low cholesterol diet and exercise.  -     Lipid panel  Vitamin D deficiency Continue supplementation At goal at last visit; defer checking level  Anemia Persistent macrocytic hyperchromic anemia not suggestive of iron deficiency anemia; he is on iron supplement reportedly "for over 10 years"; hasn't had B12, folate, iron levels checked. Will proceed today after discussion with patient, anticipate stopping iron supplement   Actinic keratoses Numerous lesions suggestive of actinic keratoses; he will  schedule follow up with well established derm  Continue diet and meds as discussed. Further disposition pending results of labs. Discussed med's effects and SE's.   Over 30 minutes of exam, counseling, chart review, and critical decision making was performed.   Future Appointments  Date Time Provider DShell 06/18/2019  9:30 AM MUnk Pinto MD [facility] None  09/19/2019 10:00 AM CLiane Comber NP [facility] None  12/24/2019  3:00 PM MUnk Pinto MD [facility] None    ----------------------------------------------------------------------------------------------------------------------  HPI 73y.o. male  presents for 3 month follow up on hypertension, cholesterol, diabetes, weight and vitamin D deficiency.   Patient has ASCAD with stenting in 2001, CABG 2008 with very infrequent CCS class II exertional angina pectoris and followed by Dr. CSallyanne Kuster recent admission for chest pain resulted in unremarkable cardiac workup and attributed to GERD  He was initiated on short course of Protonix which fully resolved symptoms, he has tapered off per instructions as of last week, reports not currently having symptoms of GERD despite not restarting omeprazole.   BMI is Body mass index is 27.2 kg/m., he has been working on diet and reports he is active throughout the day, doesn't sit much, but not intentionally exercising.  Wt Readings from Last 3 Encounters:  03/12/19 184 lb 3.2 oz (83.6 kg)  02/21/19 180 lb (81.6 kg)  02/13/19 180 lb (81.6 kg)   His blood pressure has been controlled at home (115-130/60-70 at home), today their BP is BP: 134/62(134/62-sitting and 126/62-standing)  He does not workout though very active around his property. He denies chest pain, shortness of breath, dizziness.   He is on cholesterol medication (zetia, omega 3, rosuvastatin 20 mg twice weekly) and denies myalgias. His cholesterol  is at goal. The cholesterol last visit was:   Lab Results   Component Value Date   CHOL 110 12/06/2018   HDL 46 12/06/2018   LDLCALC 43 12/06/2018   TRIG 130 12/06/2018   CHOLHDL 2.4 12/06/2018    He has been working on diet for T2 diabetes on meformin (2000 mg daily), and denies foot ulcerations, increased appetite, nausea, paresthesia of the feet, polydipsia, polyuria, visual disturbances, vomiting and weight loss. He does check fasting sugars, recently consistently running in 120-130s. Last A1C in the office was:  Lab Results  Component Value Date   HGBA1C 6.5 (H) 12/06/2018   Lab Results  Component Value Date   GFRNONAA >60 02/18/2019   He is on thyroid medication. His medication was not changed last visit.   Lab Results  Component Value Date   TSH 1.78 12/06/2018   Patient is on Vitamin D supplement.   Lab Results  Component Value Date   VD25OH 77 12/06/2018      He has persistent anemia worse at last check; recently macrocytic/hyperchromic Lab Results  Component Value Date   WBC 6.8 02/18/2019   HGB 12.7 (L) 02/18/2019   HCT 38.4 (L) 02/18/2019   MCV 104.9 (H) 02/18/2019   PLT 176 02/18/2019   He reports has been on iron supplement for many years, ? 10+ years, unsure of dose Lab Results  Component Value Date   IRON 129 06/04/2015   TIBC 291 06/04/2015   Lab Results  Component Value Date   UTMLYYTK35 465 06/04/2015   No results found for: FOLATE  No results found for: RETICCTPCT  Current Medications:  Current Outpatient Medications on File Prior to Visit  Medication Sig  . acyclovir (ZOVIRAX) 800 MG tablet Take 1 tablet daily for Fever Blister / Mouth Ulcers (Patient taking differently: Take 800 mg by mouth as needed. For Fever Blister / Mouth Ulcers)  . aspirin EC 81 MG tablet Take 1 tablet (81 mg total) by mouth daily.  Marland Kitchen atenolol (TENORMIN) 50 MG tablet Take 1 tablet Daily for BP & Heart  . Blood Glucose Monitoring Suppl DEVI Use kit to check blood sugar once a day  . Cholecalciferol 5000 UNITS TABS Take 1  tablet by mouth daily.  Marland Kitchen ezetimibe (ZETIA) 10 MG tablet Take 1 tablet Daily for E11.69 (Cholesterol) (Patient taking differently: Take 10 mg by mouth daily. )  . glucose blood (CONTOUR NEXT TEST) test strip Test blood sugar once daily  . IRON CR PO Take 1 tablet by mouth daily.  . Lancets MISC Check blood sugar once a day  . levothyroxine (SYNTHROID, LEVOTHROID) 50 MCG tablet TAKE 1 TABLET EVERY MORNING ON AN EMPTY STOMACH WITH ONLY WATER FOR 30 MINUTES (Patient taking differently: Take 50 mcg by mouth daily. Take 1 tablet every morning on an empty stomach with only water for 30 minutes)  . losartan (COZAAR) 25 MG tablet Take 0.5 tablets (12.5 mg total) by mouth at bedtime. (Patient taking differently: Take 12.5 mg by mouth daily. )  . metFORMIN (GLUCOPHAGE XR) 500 MG 24 hr tablet Take 2 tablets 2 x / day with food for Diabetes (Patient taking differently: Take 1,000 mg by mouth 2 (two) times daily. )  . Multiple Vitamin (MULTIVITAMIN) tablet Take 1 tablet by mouth at bedtime.   . Omega-3 Fatty Acids (FISH OIL) 1000 MG CAPS Take 1,000 mg by mouth at bedtime.   . rosuvastatin (CRESTOR) 20 MG tablet Take 20 mg by mouth 2 (two) times a  week.   No current facility-administered medications on file prior to visit.      Allergies:  Allergies  Allergen Reactions  . Gemfibrozil Other (See Comments)    Muscle aches  . Ace Inhibitors Cough  . Lipitor [Atorvastatin] Other (See Comments)    Joint pain.     Medical History:  Past Medical History:  Diagnosis Date  . Anemia    low iron  . Arthritis   . Cancer ([place])    skin cancer on face  . Cataracts, bilateral   . Coronary artery disease    CABG - 2008  . Diabetes mellitus without complication ([place])    type II  . GERD (gastroesophageal reflux disease)   . Hepatitis   . Hyperlipidemia   . Hypertension    pt states he does not have HTN, takes Atenolol for migraines  . Hypothyroidism   . Migraine headache with aura    takes Atenolol  .  Sleep apnea    does not use cpap   Family history- Reviewed and unchanged Social history- Reviewed and unchanged   Review of Systems:  Review of Systems  Constitutional: Negative for malaise/fatigue and weight loss.  HENT: Negative for hearing loss and tinnitus.   Eyes: Negative for blurred vision and double vision.  Respiratory: Negative for cough, shortness of breath and wheezing.   Cardiovascular: Negative for chest pain, palpitations, orthopnea, claudication and leg swelling.  Gastrointestinal: Negative for abdominal pain, blood in stool, constipation, diarrhea, heartburn, melena, nausea and vomiting.  Genitourinary: Negative.   Musculoskeletal: Negative for joint pain and myalgias.  Skin: Negative for rash.  Neurological: Negative for dizziness, tingling, sensory change, weakness and headaches.  Endo/Heme/Allergies: Negative for polydipsia.  Psychiatric/Behavioral: Negative.   All other systems reviewed and are negative.     Physical Exam: BP 134/62 Comment: 134/62-sitting and 126/62-standing  Pulse 72   Temp (!) 97.3 F (36.3 C)   Resp 16   Ht '5\' 9"'$  (1.753 m)   Wt 184 lb 3.2 oz (83.6 kg)   BMI 27.20 kg/m  Wt Readings from Last 3 Encounters:  03/12/19 184 lb 3.2 oz (83.6 kg)  02/21/19 180 lb (81.6 kg)  02/13/19 180 lb (81.6 kg)   General Appearance: Well nourished, in no apparent distress. Eyes: PERRLA, EOMs, conjunctiva no swelling or erythema Sinuses: No Frontal/maxillary tenderness ENT/Mouth: Ext aud canals clear, TMs without erythema, bulging. No erythema, swelling, or exudate on post pharynx.  Tonsils not swollen or erythematous. Hearing normal.  Neck: Supple, thyroid normal.  Respiratory: Respiratory effort normal, BS equal bilaterally without rales, rhonchi, wheezing or stridor.  Cardio: RRR with no MRGs. Brisk peripheral pulses without edema.  Abdomen: Soft, + BS.  Non tender, no guarding, rebound, hernias, masses. Lymphatics: Non tender without  lymphadenopathy.  Musculoskeletal: Full ROM, 5/5 strength, Normal gait Skin: Warm, dry without rashes,  ecchymosis. Numerous areas of erythematous base with scaly/crusty skin overlying; he will follow up with derm Neuro: Cranial nerves intact. No cerebellar symptoms.  Psych: Awake and oriented X 3, normal affect, Insight and Judgment appropriate.    Izora Ribas, NP 9:48 AM [facility]

## 2019-03-12 ENCOUNTER — Ambulatory Visit (INDEPENDENT_AMBULATORY_CARE_PROVIDER_SITE_OTHER): Payer: Medicare Other | Admitting: Adult Health

## 2019-03-12 ENCOUNTER — Encounter: Payer: Self-pay | Admitting: Adult Health

## 2019-03-12 ENCOUNTER — Other Ambulatory Visit: Payer: Self-pay

## 2019-03-12 VITALS — BP 134/62 | HR 72 | Temp 97.3°F | Resp 16 | Ht 69.0 in | Wt 184.2 lb

## 2019-03-12 DIAGNOSIS — I25810 Atherosclerosis of coronary artery bypass graft(s) without angina pectoris: Secondary | ICD-10-CM | POA: Diagnosis not present

## 2019-03-12 DIAGNOSIS — E538 Deficiency of other specified B group vitamins: Secondary | ICD-10-CM | POA: Diagnosis not present

## 2019-03-12 DIAGNOSIS — D649 Anemia, unspecified: Secondary | ICD-10-CM | POA: Diagnosis not present

## 2019-03-12 DIAGNOSIS — E559 Vitamin D deficiency, unspecified: Secondary | ICD-10-CM

## 2019-03-12 DIAGNOSIS — I10 Essential (primary) hypertension: Secondary | ICD-10-CM | POA: Diagnosis not present

## 2019-03-12 DIAGNOSIS — R5383 Other fatigue: Secondary | ICD-10-CM | POA: Diagnosis not present

## 2019-03-12 DIAGNOSIS — E663 Overweight: Secondary | ICD-10-CM | POA: Diagnosis not present

## 2019-03-12 DIAGNOSIS — K219 Gastro-esophageal reflux disease without esophagitis: Secondary | ICD-10-CM

## 2019-03-12 DIAGNOSIS — E1122 Type 2 diabetes mellitus with diabetic chronic kidney disease: Secondary | ICD-10-CM | POA: Diagnosis not present

## 2019-03-12 DIAGNOSIS — E1169 Type 2 diabetes mellitus with other specified complication: Secondary | ICD-10-CM

## 2019-03-12 DIAGNOSIS — E039 Hypothyroidism, unspecified: Secondary | ICD-10-CM | POA: Diagnosis not present

## 2019-03-12 DIAGNOSIS — N182 Chronic kidney disease, stage 2 (mild): Secondary | ICD-10-CM

## 2019-03-12 DIAGNOSIS — E785 Hyperlipidemia, unspecified: Secondary | ICD-10-CM

## 2019-03-12 DIAGNOSIS — Z79899 Other long term (current) drug therapy: Secondary | ICD-10-CM | POA: Diagnosis not present

## 2019-03-12 MED ORDER — FAMOTIDINE 20 MG PO TABS: 20.0000 mg | ORAL_TABLET | Freq: Two times a day (BID) | ORAL | 1 refills | Status: DC | PRN

## 2019-03-12 NOTE — Patient Instructions (Addendum)
Goals    . Exercise 150 min/wk Moderate Activity    . LDL CALC < 100       Call derm surgery center to schedule a follow up appointment     Anemia  Anemia is a condition in which you do not have enough red blood cells or hemoglobin. Hemoglobin is a substance in red blood cells that carries oxygen. When you do not have enough red blood cells or hemoglobin (are anemic), your body cannot get enough oxygen and your organs may not work properly. As a result, you may feel very tired or have other problems. What are the causes? Common causes of anemia include:  Excessive bleeding. Anemia can be caused by excessive bleeding inside or outside the body, including bleeding from the intestine or from periods in women.  Poor nutrition.  Long-lasting (chronic) kidney, thyroid, and liver disease.  Bone marrow disorders.  Cancer and treatments for cancer.  HIV (human immunodeficiency virus) and AIDS (acquired immunodeficiency syndrome).  Treatments for HIV and AIDS.  Spleen problems.  Blood disorders.  Infections, medicines, and autoimmune disorders that destroy red blood cells. What are the signs or symptoms? Symptoms of this condition include:  Minor weakness.  Dizziness.  Headache.  Feeling heartbeats that are irregular or faster than normal (palpitations).  Shortness of breath, especially with exercise.  Paleness.  Cold sensitivity.  Indigestion.  Nausea.  Difficulty sleeping.  Difficulty concentrating. Symptoms may occur suddenly or develop slowly. If your anemia is mild, you may not have symptoms. How is this diagnosed? This condition is diagnosed based on:  Blood tests.  Your medical history.  A physical exam.  Bone marrow biopsy. Your health care provider may also check your stool (feces) for blood and may do additional testing to look for the cause of your bleeding. You may also have other tests, including:  Imaging tests, such as a CT scan or  MRI.  Endoscopy.  Colonoscopy. How is this treated? Treatment for this condition depends on the cause. If you continue to lose a lot of blood, you may need to be treated at a hospital. Treatment may include:  Taking supplements of iron, vitamin H47, or folic acid.  Taking a hormone medicine (erythropoietin) that can help to stimulate red blood cell growth.  Having a blood transfusion. This may be needed if you lose a lot of blood.  Making changes to your diet.  Having surgery to remove your spleen. Follow these instructions at home:  Take over-the-counter and prescription medicines only as told by your health care provider.  Take supplements only as told by your health care provider.  Follow any diet instructions that you were given.  Keep all follow-up visits as told by your health care provider. This is important. Contact a health care provider if:  You develop new bleeding anywhere in the body. Get help right away if:  You are very weak.  You are short of breath.  You have pain in your abdomen or chest.  You are dizzy or feel faint.  You have trouble concentrating.  You have bloody or black, tarry stools.  You vomit repeatedly or you vomit up blood. Summary  Anemia is a condition in which you do not have enough red blood cells or enough of a substance in your red blood cells that carries oxygen (hemoglobin).  Symptoms may occur suddenly or develop slowly.  If your anemia is mild, you may not have symptoms.  This condition is diagnosed with blood tests  as well as a medical history and physical exam. Other tests may be needed.  Treatment for this condition depends on the cause of the anemia. This information is not intended to replace advice given to you by your health care provider. Make sure you discuss any questions you have with your health care provider. Document Released: 07/28/2004 Document Revised: 06/02/2017 Document Reviewed: 07/22/2016 Elsevier  Patient Education  2020 [employer].

## 2019-03-13 LAB — HEMOGLOBIN A1C
Hgb A1c MFr Bld: 6.5 % of total Hgb — ABNORMAL HIGH (ref <5.7)
Mean Plasma Glucose: 140 (calc)
eAG (mmol/L): 7.7 (calc)

## 2019-03-13 LAB — CBC WITH DIFFERENTIAL/PLATELET
Absolute Monocytes: 496 cells/uL (ref 200–950)
Basophils Absolute: 40 cells/uL (ref 0–200)
Basophils Relative: 0.7 %
Eosinophils Absolute: 308 cells/uL (ref 15–500)
Eosinophils Relative: 5.4 %
HCT: 39.8 % (ref 38.5–50.0)
Hemoglobin: 13.2 g/dL (ref 13.2–17.1)
Lymphs Abs: 2217 cells/uL (ref 850–3900)
MCH: 34 pg — ABNORMAL HIGH (ref 27.0–33.0)
MCHC: 33.2 g/dL (ref 32.0–36.0)
MCV: 102.6 fL — ABNORMAL HIGH (ref 80.0–100.0)
MPV: 10.2 fL (ref 7.5–12.5)
Monocytes Relative: 8.7 %
Neutro Abs: 2639 cells/uL (ref 1500–7800)
Neutrophils Relative %: 46.3 %
Platelets: 176 10*3/uL (ref 140–400)
RBC: 3.88 10*6/uL — ABNORMAL LOW (ref 4.20–5.80)
RDW: 12 % (ref 11.0–15.0)
Total Lymphocyte: 38.9 %
WBC: 5.7 10*3/uL (ref 3.8–10.8)

## 2019-03-13 LAB — IRON,TIBC AND FERRITIN PANEL
%SAT: 36 % (calc) (ref 20–48)
Ferritin: 181 ng/mL (ref 24–380)
Iron: 105 ug/dL (ref 50–180)
TIBC: 289 mcg/dL (calc) (ref 250–425)

## 2019-03-13 LAB — COMPLETE METABOLIC PANEL WITH GFR
AG Ratio: 1.9 (calc) (ref 1.0–2.5)
ALT: 21 U/L (ref 9–46)
AST: 19 U/L (ref 10–35)
Albumin: 4.5 g/dL (ref 3.6–5.1)
Alkaline phosphatase (APISO): 51 U/L (ref 35–144)
BUN: 17 mg/dL (ref 7–25)
CO2: 29 mmol/L (ref 20–32)
Calcium: 9.9 mg/dL (ref 8.6–10.3)
Chloride: 101 mmol/L (ref 98–110)
Creat: 0.93 mg/dL (ref 0.70–1.18)
GFR, Est African American: 93 mL/min/{1.73_m2} (ref >=60)
GFR, Est Non African American: 80 mL/min/{1.73_m2} (ref >=60)
Globulin: 2.4 g/dL (calc) (ref 1.9–3.7)
Glucose, Bld: 154 mg/dL — ABNORMAL HIGH (ref 65–99)
Potassium: 4.6 mmol/L (ref 3.5–5.3)
Sodium: 140 mmol/L (ref 135–146)
Total Bilirubin: 0.6 mg/dL (ref 0.2–1.2)
Total Protein: 6.9 g/dL (ref 6.1–8.1)

## 2019-03-13 LAB — LIPID PANEL
Cholesterol: 125 mg/dL (ref <200)
HDL: 47 mg/dL (ref >=40)
LDL Cholesterol (Calc): 57 mg/dL (calc)
Non-HDL Cholesterol (Calc): 78 mg/dL (calc) (ref <130)
Total CHOL/HDL Ratio: 2.7 (calc) (ref <5.0)
Triglycerides: 120 mg/dL (ref <150)

## 2019-03-13 LAB — FOLATE RBC: RBC Folate: 934 ng/mL RBC (ref >280)

## 2019-03-13 LAB — TSH: TSH: 1.4 mIU/L (ref 0.40–4.50)

## 2019-03-13 LAB — MAGNESIUM: Magnesium: 1.7 mg/dL (ref 1.5–2.5)

## 2019-03-13 LAB — VITAMIN B12: Vitamin B-12: 380 pg/mL (ref 200–1100)

## 2019-03-13 LAB — RETICULOCYTES
ABS Retic: 65960 cells/uL (ref 25000–9000)
Retic Ct Pct: 1.7 %

## 2019-04-24 DIAGNOSIS — L821 Other seborrheic keratosis: Secondary | ICD-10-CM | POA: Diagnosis not present

## 2019-04-24 DIAGNOSIS — C441222 Squamous cell carcinoma of skin of right lower eyelid, including canthus: Secondary | ICD-10-CM | POA: Diagnosis not present

## 2019-04-24 DIAGNOSIS — D225 Melanocytic nevi of trunk: Secondary | ICD-10-CM | POA: Diagnosis not present

## 2019-04-24 DIAGNOSIS — Z85828 Personal history of other malignant neoplasm of skin: Secondary | ICD-10-CM | POA: Diagnosis not present

## 2019-04-24 DIAGNOSIS — D485 Neoplasm of uncertain behavior of skin: Secondary | ICD-10-CM | POA: Diagnosis not present

## 2019-04-24 DIAGNOSIS — C44629 Squamous cell carcinoma of skin of left upper limb, including shoulder: Secondary | ICD-10-CM | POA: Diagnosis not present

## 2019-04-24 DIAGNOSIS — L570 Actinic keratosis: Secondary | ICD-10-CM | POA: Diagnosis not present

## 2019-05-07 DIAGNOSIS — Z961 Presence of intraocular lens: Secondary | ICD-10-CM | POA: Diagnosis not present

## 2019-05-07 DIAGNOSIS — E119 Type 2 diabetes mellitus without complications: Secondary | ICD-10-CM | POA: Diagnosis not present

## 2019-05-07 LAB — HM DIABETES EYE EXAM

## 2019-05-16 ENCOUNTER — Encounter: Payer: Self-pay | Admitting: *Deleted

## 2019-05-17 ENCOUNTER — Other Ambulatory Visit: Payer: Self-pay | Admitting: Internal Medicine

## 2019-06-18 ENCOUNTER — Encounter: Payer: Self-pay | Admitting: Internal Medicine

## 2019-06-18 ENCOUNTER — Ambulatory Visit (INDEPENDENT_AMBULATORY_CARE_PROVIDER_SITE_OTHER): Payer: Medicare Other | Admitting: Internal Medicine

## 2019-06-18 ENCOUNTER — Other Ambulatory Visit: Payer: Self-pay

## 2019-06-18 VITALS — BP 148/70 | HR 64 | Temp 96.9°F | Resp 16 | Ht 69.0 in | Wt 177.0 lb

## 2019-06-18 DIAGNOSIS — I10 Essential (primary) hypertension: Secondary | ICD-10-CM

## 2019-06-18 DIAGNOSIS — E559 Vitamin D deficiency, unspecified: Secondary | ICD-10-CM | POA: Diagnosis not present

## 2019-06-18 DIAGNOSIS — E1169 Type 2 diabetes mellitus with other specified complication: Secondary | ICD-10-CM

## 2019-06-18 DIAGNOSIS — Z79899 Other long term (current) drug therapy: Secondary | ICD-10-CM

## 2019-06-18 DIAGNOSIS — N182 Chronic kidney disease, stage 2 (mild): Secondary | ICD-10-CM

## 2019-06-18 DIAGNOSIS — E1122 Type 2 diabetes mellitus with diabetic chronic kidney disease: Secondary | ICD-10-CM | POA: Diagnosis not present

## 2019-06-18 DIAGNOSIS — I25810 Atherosclerosis of coronary artery bypass graft(s) without angina pectoris: Secondary | ICD-10-CM

## 2019-06-18 DIAGNOSIS — E785 Hyperlipidemia, unspecified: Secondary | ICD-10-CM

## 2019-06-18 DIAGNOSIS — E039 Hypothyroidism, unspecified: Secondary | ICD-10-CM

## 2019-06-18 NOTE — Patient Instructions (Signed)

## 2019-06-18 NOTE — Progress Notes (Signed)
History of Present Illness:      This very nice 73 y.o. MWM presents for 6 month follow up with HTN, ASCAD/CABG,  [name], [name], Hypothyroidism and Vitamin D Deficiency.       Patient is treated for HTN (1989) & BP has been controlled at home. Today's BP was slightly elevated at 148/70. In 2008, patient had PCA/Stenting for ACS followed later that year by CABG. In 2013 & 2014 , patient had #2 negative Cardiolite stress tests. In 2017, he had a negative std ETT. Patient has had no complaints of any cardiac type chest pain, palpitations, dyspnea / orthopnea / PND, dizziness, claudication, or dependent edema.      Hyperlipidemia is controlled with diet & meds. Patient denies myalgias or other med SE's. Last Lipids were at goal:  Lab Results  Component Value Date   CHOL 125 03/12/2019   HDL 47 03/12/2019   LDLCALC 57 03/12/2019   TRIG 120 03/12/2019   CHOLHDL 2.7 03/12/2019        Also, the patient has history of [name] (2000) and he has CKD2. Patient denies symptoms of reactive hypoglycemia, diabetic polys, paresthesias or visual blurring.  Last A1c was not at goal:  Lab Results  Component Value Date   HGBA1C 6.5 (H) 03/12/2019       In 2003, patient was dx'd Hypothyroid & initiated on thyroid replacement.       Further, the patient also has history of Vitamin D Deficiency ("33" / 2008)  and supplements vitamin D without any suspected side-effects. Last vitamin D was at goal:  Lab Results  Component Value Date   VD25OH 77 12/06/2018    Current Outpatient Medications on File Prior to Visit  Medication Sig  . acyclovir (ZOVIRAX) 800 MG tablet Take 1 tablet daily for Fever Blister / Mouth Ulcers (Patient taking differently: Take 800 mg by mouth as needed. For Fever Blister / Mouth Ulcers)  . aspirin EC 81 MG tablet Take 1 tablet (81 mg total) by mouth daily.  Marland Kitchen atenolol (TENORMIN) 50 MG tablet Take 1 tablet Daily for BP & Heart  . Blood Glucose Monitoring Suppl DEVI Use kit  to check blood sugar once a day  . Cholecalciferol 5000 UNITS TABS Take 1 tablet by mouth daily.  Marland Kitchen ezetimibe (ZETIA) 10 MG tablet Take 1 tablet Daily for E11.69 (Cholesterol) (Patient taking differently: Take 10 mg by mouth daily. )  . famotidine (PEPCID) 20 MG tablet Take 1 tablet (20 mg total) by mouth 2 (two) times daily as needed for heartburn or indigestion.  Marland Kitchen glucose blood (CONTOUR NEXT TEST) test strip Test blood sugar once daily  . IRON CR PO Take 1 tablet by mouth daily.  . Lancets MISC Check blood sugar once a day  . levothyroxine (SYNTHROID, LEVOTHROID) 50 MCG tablet TAKE 1 TABLET EVERY MORNING ON AN EMPTY STOMACH WITH ONLY WATER FOR 30 MINUTES (Patient taking differently: Take 50 mcg by mouth daily. Take 1 tablet every morning on an empty stomach with only water for 30 minutes)  . losartan (COZAAR) 25 MG tablet Take 0.5 tablets (12.5 mg total) by mouth at bedtime. (Patient taking differently: Take 12.5 mg by mouth daily. )  . metFORMIN (GLUCOPHAGE-XR) 500 MG 24 hr tablet Take 2 tablets 2 x /day with Meals for Diabetes  . Multiple Vitamin (MULTIVITAMIN) tablet Take 1 tablet by mouth at bedtime.   . Omega-3 Fatty Acids (FISH OIL) 1000 MG CAPS Take 1,000 mg by  mouth at bedtime.   . rosuvastatin (CRESTOR) 20 MG tablet Take 20 mg by mouth 2 (two) times a week.   No current facility-administered medications on file prior to visit.    Allergies  Allergen Reactions  . Gemfibrozil Other (See Comments)    Muscle aches  . Ace Inhibitors Cough  . Lipitor [Atorvastatin] Other (See Comments)    Joint pain.    PMHx:   Past Medical History:  Diagnosis Date  . Anemia    low iron  . Arthritis   . Cancer ([place])    skin cancer on face  . Cataracts, bilateral   . Coronary artery disease    CABG - 2008  . Diabetes mellitus without complication ([place])    type II  . GERD (gastroesophageal reflux disease)   . Hepatitis   . Hyperlipidemia   . Hypertension    pt states he does not have  HTN, takes Atenolol for migraines  . Hypothyroidism   . Migraine headache with aura    takes Atenolol  . Sleep apnea    does not use cpap   Immunization History  Administered Date(s) Administered  . DT 08/29/2003, 06/04/2015  . Influenza Split 02/21/2019  . Influenza, High Dose Seasonal PF 04/03/2018  . Influenza-Unspecified 04/16/2014, 03/30/2016, 04/02/2017  . Pneumococcal Conjugate-13 12/26/2013  . Pneumococcal Polysaccharide-23 01/29/2010  . Zoster 05/11/2010  . Zoster Recombinat (Shingrix) 01/31/2017, 07/05/2017   Past Surgical History:  Procedure Laterality Date  . BACK SURGERY    . CARDIAC SURGERY     Bypass  . CATARACT EXTRACTION, BILATERAL Bilateral 2018   Dr. Gershon Crane  . COLONOSCOPY    . CORONARY ANGIOPLASTY  2001  . CORONARY ARTERY BYPASS GRAFT    . ELBOW SURGERY Left    due to damaged nerve  . KNEE SURGERY Right    arthroscopic  . LUMBAR LAMINECTOMY/DECOMPRESSION MICRODISCECTOMY Right 08/04/2014   Procedure: Right Lumbar four-five Microdiskectomy;  Surgeon: Kristeen Miss, MD;  Location: [facility];  Service: Neurosurgery;  Laterality: Right;  Right L4-5 Microdiskectomy  . SURGERY SCROTAL / TESTICULAR     as a child/teenager    FHx:    Reviewed / unchanged  SHx:    Reviewed / unchanged   Systems Review:  Constitutional: Denies fever, chills, wt changes, headaches, insomnia, fatigue, night sweats, change in appetite. Eyes: Denies redness, blurred vision, diplopia, discharge, itchy, watery eyes.  ENT: Denies discharge, congestion, post nasal drip, epistaxis, sore throat, earache, hearing loss, dental pain, tinnitus, vertigo, sinus pain, snoring.  CV: Denies chest pain, palpitations, irregular heartbeat, syncope, dyspnea, diaphoresis, orthopnea, PND, claudication or edema. Respiratory: denies cough, dyspnea, DOE, pleurisy, hoarseness, laryngitis, wheezing.  Gastrointestinal: Denies dysphagia, odynophagia, heartburn, reflux, water brash, abdominal pain or cramps,  nausea, vomiting, bloating, diarrhea, constipation, hematemesis, melena, hematochezia  or hemorrhoids. Genitourinary: Denies dysuria, frequency, urgency, nocturia, hesitancy, discharge, hematuria or flank pain. Musculoskeletal: Denies arthralgias, myalgias, stiffness, jt. swelling, pain, limping or strain/sprain.  Skin: Denies pruritus, rash, hives, warts, acne, eczema or change in skin lesion(s). Neuro: No weakness, tremor, incoordination, spasms, paresthesia or pain. Psychiatric: Denies confusion, memory loss or sensory loss. Endo: Denies change in weight, skin or hair change.  Heme/Lymph: No excessive bleeding, bruising or enlarged lymph nodes.  Physical Exam  BP (!) 148/70   Pulse 64   Temp (!) 96.9 F (36.1 C)   Resp 16   Ht _0  (1.753 m)   Wt 177 lb (80.3 kg)   BMI 26.14 kg/m   Appears  well nourished, well groomed  and in no distress.  Eyes: PERRLA, EOMs, conjunctiva no swelling or erythema. Sinuses: No frontal/maxillary tenderness ENT/Mouth: EAC's clear, TM's nl w/o erythema, bulging. Nares clear w/o erythema, swelling, exudates. Oropharynx clear without erythema or exudates. Oral hygiene is good. Tongue normal, non obstructing. Hearing intact.  Neck: Supple. Thyroid not palpable. Car 2+/2+ without bruits, nodes or JVD. Chest: Respirations nl with BS clear & equal w/o rales, rhonchi, wheezing or stridor.  Cor: Heart sounds normal w/ regular rate and rhythm without sig. murmurs, gallops, clicks or rubs. Peripheral pulses normal and equal  without edema.  Abdomen: Soft & bowel sounds normal. Non-tender w/o guarding, rebound, hernias, masses or organomegaly.  Lymphatics: Unremarkable.  Musculoskeletal: Full ROM all peripheral extremities, joint stability, 5/5 strength and normal gait.  Skin: Warm, dry without exposed rashes, lesions or ecchymosis apparent.  Neuro: Cranial nerves intact, reflexes equal bilaterally. Sensory-motor testing grossly intact. Tendon reflexes grossly  intact.  Pysch: Alert & oriented x 3.  Insight and judgement nl & appropriate. No ideations.  Assessment and Plan:  1. Essential hypertension  - Continue medication, monitor blood pressure at home.  - Continue DASH diet.  Reminder to go to the ER if any CP,  SOB, nausea, dizziness, severe HA, changes vision/speech.  - CBC with Diff - COMPLETE METABOLIC PANEL WITH GFR - Magnesium - TSH  2. Hyperlipidemia associated with type 2 diabetes mellitus ([place])  - Continue diet/meds, exercise,& lifestyle modifications.  - Continue monitor periodic cholesterol/liver & renal functions   - Lipid Profile - TSH  3. Type 2 diabetes mellitus with stage 2 chronic kidney disease,  without long-term current use of insulin (HCC)  - Continue diet, exercise  - Lifestyle modifications.  - Monitor appropriate labs.  - Hemoglobin A1c ([facility]) - Insulin, random  4. Vitamin D deficiency  - Continue supplementation.  - Vitamin D (25 hydroxy)  5. Coronary artery disease involving coronary bypass graft of  native heart without angina pectoris  - Lipid Profile  6. Hypothyroidism, unspecified type  - TSH  7. Medication management  - CBC with Diff - COMPLETE METABOLIC PANEL WITH GFR - Magnesium - Lipid Profile - TSH - Hemoglobin A1c ([facility]) - Insulin, random - Vitamin D (25 hydroxy)        Discussed  regular exercise, BP monitoring, weight control to achieve/maintain BMI less than 25 and discussed med and SE's. Recommended labs to assess and monitor clinical status with further disposition pending results of labs.  I discussed the assessment and treatment plan with the patient. The patient was provided an opportunity to ask questions and all were answered. The patient agreed with the plan and demonstrated an understanding of the instructions.  I provided over 30 minutes of exam, counseling, chart review and  complex critical decision making.   Kirtland Bouchard, MD

## 2019-06-19 LAB — CBC WITH DIFFERENTIAL/PLATELET
Absolute Monocytes: 370 cells/uL (ref 200–950)
Basophils Absolute: 19 cells/uL (ref 0–200)
Basophils Relative: 0.4 %
Eosinophils Absolute: 221 cells/uL (ref 15–500)
Eosinophils Relative: 4.6 %
HCT: 38.2 % — ABNORMAL LOW (ref 38.5–50.0)
Hemoglobin: 12.7 g/dL — ABNORMAL LOW (ref 13.2–17.1)
Lymphs Abs: 1872 cells/uL (ref 850–3900)
MCH: 34 pg — ABNORMAL HIGH (ref 27.0–33.0)
MCHC: 33.2 g/dL (ref 32.0–36.0)
MCV: 102.4 fL — ABNORMAL HIGH (ref 80.0–100.0)
MPV: 10.1 fL (ref 7.5–12.5)
Monocytes Relative: 7.7 %
Neutro Abs: 2318 cells/uL (ref 1500–7800)
Neutrophils Relative %: 48.3 %
Platelets: 192 10*3/uL (ref 140–400)
RBC: 3.73 10*6/uL — ABNORMAL LOW (ref 4.20–5.80)
RDW: 12 % (ref 11.0–15.0)
Total Lymphocyte: 39 %
WBC: 4.8 10*3/uL (ref 3.8–10.8)

## 2019-06-19 LAB — COMPLETE METABOLIC PANEL WITH GFR
AG Ratio: 2 (calc) (ref 1.0–2.5)
ALT: 18 U/L (ref 9–46)
AST: 19 U/L (ref 10–35)
Albumin: 4.5 g/dL (ref 3.6–5.1)
Alkaline phosphatase (APISO): 40 U/L (ref 35–144)
BUN: 13 mg/dL (ref 7–25)
CO2: 26 mmol/L (ref 20–32)
Calcium: 9.6 mg/dL (ref 8.6–10.3)
Chloride: 105 mmol/L (ref 98–110)
Creat: 0.88 mg/dL (ref 0.70–1.18)
GFR, Est African American: 97 mL/min/{1.73_m2} (ref >=60)
GFR, Est Non African American: 84 mL/min/{1.73_m2} (ref >=60)
Globulin: 2.2 g/dL (calc) (ref 1.9–3.7)
Glucose, Bld: 107 mg/dL — ABNORMAL HIGH (ref 65–99)
Potassium: 4.4 mmol/L (ref 3.5–5.3)
Sodium: 141 mmol/L (ref 135–146)
Total Bilirubin: 0.6 mg/dL (ref 0.2–1.2)
Total Protein: 6.7 g/dL (ref 6.1–8.1)

## 2019-06-19 LAB — LIPID PANEL
Cholesterol: 106 mg/dL (ref <200)
HDL: 50 mg/dL (ref >=40)
LDL Cholesterol (Calc): 39 mg/dL (calc)
Non-HDL Cholesterol (Calc): 56 mg/dL (calc) (ref <130)
Total CHOL/HDL Ratio: 2.1 (calc) (ref <5.0)
Triglycerides: 85 mg/dL (ref <150)

## 2019-06-19 LAB — MAGNESIUM: Magnesium: 2 mg/dL (ref 1.5–2.5)

## 2019-06-19 LAB — VITAMIN D 25 HYDROXY (VIT D DEFICIENCY, FRACTURES): Vit D, 25-Hydroxy: 69 ng/mL (ref 30–100)

## 2019-06-19 LAB — TSH: TSH: 1.58 mIU/L (ref 0.40–4.50)

## 2019-06-19 LAB — HEMOGLOBIN A1C
Hgb A1c MFr Bld: 6.2 % of total Hgb — ABNORMAL HIGH (ref <5.7)
Mean Plasma Glucose: 131 (calc)
eAG (mmol/L): 7.3 (calc)

## 2019-06-19 LAB — INSULIN, RANDOM: Insulin: 10.3 u[IU]/mL

## 2019-07-02 ENCOUNTER — Telehealth: Payer: Self-pay

## 2019-07-02 NOTE — Telephone Encounter (Signed)
   [facility] Pre-operative Risk Assessment    Request for surgical clearance:  1. What type of surgery is being performed? MOHS RECONSTRUCTION FOR SQUAMOUS CELL CARCINOMA RIGHT LOWER EYE LID   2. When is this surgery scheduled? 1-18-20221   3. What type of clearance is required (medical clearance vs. Pharmacy clearance to hold med vs. Both)? BOTH  4. Are there any medications that need to be held prior to surgery and how long? ASA    5. Practice name and name of physician performing surgery? [facility]  DR Isidoro Donning, MD   6. What is your office phone number [phone]    7.   What is your office fax number  [phone]  8.   Anesthesia type (None, local, MAC, general) ? NOT LISTED

## 2019-07-02 NOTE — Telephone Encounter (Signed)
   Primary Cardiologist: Dr Sallyanne Kuster  Chart reviewed and patient contacted by phone today as part of pre-operative protocol coverage. Given past medical history and time since last visit, based on ACC/AHA guidelines, Joshua Higgins would be at acceptable risk for the planned procedure without further cardiovascular testing.   OK to hold aspirin 3-5 days pre op if needed.   I will route this recommendation to the requesting party via Epic fax function and remove from pre-op pool.  Please call with questions.  Kerin Ransom, PA-C 07/02/2019, 2:26 PM

## 2019-07-16 DIAGNOSIS — Z23 Encounter for immunization: Secondary | ICD-10-CM | POA: Diagnosis not present

## 2019-07-28 ENCOUNTER — Other Ambulatory Visit: Payer: Self-pay | Admitting: Internal Medicine

## 2019-08-12 ENCOUNTER — Ambulatory Visit: Payer: Medicare Other | Attending: Internal Medicine

## 2019-08-12 DIAGNOSIS — Z23 Encounter for immunization: Secondary | ICD-10-CM | POA: Insufficient documentation

## 2019-08-13 DIAGNOSIS — H02834 Dermatochalasis of left upper eyelid: Secondary | ICD-10-CM | POA: Diagnosis not present

## 2019-08-13 DIAGNOSIS — S01111S Laceration without foreign body of right eyelid and periocular area, sequela: Secondary | ICD-10-CM | POA: Diagnosis not present

## 2019-08-13 DIAGNOSIS — H57813 Brow ptosis, bilateral: Secondary | ICD-10-CM | POA: Diagnosis not present

## 2019-08-13 DIAGNOSIS — Z85828 Personal history of other malignant neoplasm of skin: Secondary | ICD-10-CM | POA: Diagnosis not present

## 2019-08-13 DIAGNOSIS — C441222 Squamous cell carcinoma of skin of right lower eyelid, including canthus: Secondary | ICD-10-CM | POA: Diagnosis not present

## 2019-08-13 DIAGNOSIS — H02835 Dermatochalasis of left lower eyelid: Secondary | ICD-10-CM | POA: Diagnosis not present

## 2019-08-13 DIAGNOSIS — H04561 Stenosis of right lacrimal punctum: Secondary | ICD-10-CM | POA: Diagnosis not present

## 2019-08-13 DIAGNOSIS — H02832 Dermatochalasis of right lower eyelid: Secondary | ICD-10-CM | POA: Diagnosis not present

## 2019-08-13 DIAGNOSIS — H02831 Dermatochalasis of right upper eyelid: Secondary | ICD-10-CM | POA: Diagnosis not present

## 2019-08-13 DIAGNOSIS — L821 Other seborrheic keratosis: Secondary | ICD-10-CM | POA: Diagnosis not present

## 2019-08-13 DIAGNOSIS — Z483 Aftercare following surgery for neoplasm: Secondary | ICD-10-CM | POA: Diagnosis not present

## 2019-08-13 DIAGNOSIS — S0181XS Laceration without foreign body of other part of head, sequela: Secondary | ICD-10-CM | POA: Diagnosis not present

## 2019-08-13 DIAGNOSIS — Z481 Encounter for planned postprocedural wound closure: Secondary | ICD-10-CM | POA: Diagnosis not present

## 2019-08-13 DIAGNOSIS — H0289 Other specified disorders of eyelid: Secondary | ICD-10-CM | POA: Diagnosis not present

## 2019-08-13 DIAGNOSIS — S01411S Laceration without foreign body of right cheek and temporomandibular area, sequela: Secondary | ICD-10-CM | POA: Diagnosis not present

## 2019-08-16 DIAGNOSIS — Z23 Encounter for immunization: Secondary | ICD-10-CM | POA: Diagnosis not present

## 2019-09-04 ENCOUNTER — Ambulatory Visit: Payer: Self-pay | Admitting: Adult Health

## 2019-09-18 NOTE — Progress Notes (Signed)
MEDICARE ANNUAL WELLNESS VISIT AND FOLLOW UP Assessment:   Diagnoses and all orders for this visit:  Encounter for Medicare annual wellness exam  Essential hypertension Well controlled; continue medication Monitor blood pressure at home; call if consistently over 130/80 Continue DASH diet.   Reminder to go to the ER if any CP, SOB, nausea, dizziness, severe HA, changes vision/speech, left arm numbness and tingling and jaw pain.  Coronary artery disease involving coronary bypass graft of native heart, Other angina ([place]) very infrequent CCS class II exertional angina pectoris; continue BB; followed by cardiology (Dr. Sallyanne Kuster) Continue ASA, close management of cholesterol   Atherosclerosis of abdominal aorta ([place]) Per CXR 2017 Control blood pressure, cholesterol, glucose, increase exercise.   Gastroesophageal reflux disease, esophagitis presence not specified Well managed with PRN OTC medications Discussed diet, avoiding triggers and other lifestyle changes  Irritable bowel syndrome, unspecified type Avoid trigger foods; currently stable with lifestyle modification only  Type 2 diabetes mellitus with stage 2 chronic kidney disease, without long-term current use of insulin (HCC) Continue metformin Continue diet and exercise.  Perform daily foot/skin check, notify office of any concerning changes.  -     Hemoglobin A1c  CKD II associated with T2DM (HCC)  Increase fluids, avoid NSAIDS, monitor sugars, will monitor  Hypothyroidism, unspecified type Continue synthroid reminded to take on an empty stomach 30-74mns before food.  -     TSH  Mixed hyperlipidemia associated with T2DM (HCC) Continue medications; currently at LDL goal <70 Continue low cholesterol diet and exercise.  Check lipid panel.  -     Lipid panel  Vitamin D deficiency Continue supplementation At goal at last visit; defer checking level  Medication management -     CBC with Differential/Platelet -      CMP/GFR -     Magnesium  Former smoker Remote; quit in 1975; not candidate for LDCT Last CXR 02/2019 Denies notable sx   Over 30 minutes of exam, counseling, chart review, and critical decision making was performed  Future Appointments  Date Time Provider DBelt 12/24/2019  3:00 PM MUnk Pinto MD [facility] None     Plan:   During the course of the visit the patient was educated and counseled about appropriate screening and preventive services including:    Pneumococcal vaccine   Influenza vaccine  Prevnar 13  Td vaccine  Screening electrocardiogram  Colorectal cancer screening  Diabetes screening  Glaucoma screening  Nutrition counseling    Subjective:  Joshua RILINGis a 74y.o. male who presents for Medicare Annual Wellness Visit and 3 month follow up for HTN, hyperlipidemia, diabetes with CKD, hypothyroid and vitamin D Def.   Recently had MOH's surgery under R eye and has done well since.   Patient has ASCAD with stenting in 2001, CABG 2008 with very infrequent CCS class II exertional angina pectoris and followed by Dr. CSallyanne Kuster Recent admission for chest pain in 02/2019 resulted in unremarkable cardiac workup and attributed to GERD  he has a diagnosis of GERD which is currently managed by famotidine 20 mg BID PRN (few times per month)  he reports symptoms is currently well controlled, and denies breakthrough reflux, burning in chest, hoarseness or cough.    BMI is Body mass index is 24.51 kg/m., he has been working on diet and exercise, has been very active around house and yard, cutting down trees. Watching portions.  Wt Readings from Last 3 Encounters:  09/19/19 166 lb (75.3 kg)  06/18/19 177 lb (  80.3 kg)  03/12/19 184 lb 3.2 oz (83.6 kg)   He has aortic atherosclerosis per CXR 2017.  His blood pressure has been controlled at home, today their BP is BP: 138/72 He does workout. He denies chest pain, shortness of breath, dizziness.    He is on cholesterol medication (zetia 10 mg daily, rosuvastatin 20 mg twice weekly) and denies myalgias (he stopped taking gemfibrozil due to muscle aches, does not tolerate high dose statins). His cholesterol is at goal. The cholesterol last visit was:   Lab Results  Component Value Date   CHOL 106 06/18/2019   HDL 50 06/18/2019   LDLCALC 39 06/18/2019   TRIG 85 06/18/2019   CHOLHDL 2.1 06/18/2019   He has been working on diet and exercise for T2 diabetes (treated by metformin 1000 mg BID), he is on ASA, Statin, ARB and denies increased appetite, nausea, polydipsia, polyuria, visual disturbances, vomiting and weight loss. He does check fasting glucose in the AM, typically 100-115. Last A1C in the office was:  Lab Results  Component Value Date   HGBA1C 6.2 (H) 06/18/2019   He has CKD II associated with T2DM monitored at this office:  Lab Results  Component Value Date   GFRNONAA 84 06/18/2019   He is on thyroid medication. His medication was not changed last visit.   Lab Results  Component Value Date   TSH 1.58 06/18/2019    Patient is on Vitamin D supplement and at goal at last check:   Lab Results  Component Value Date   VD25OH 69 06/18/2019      Medication Review: Current Outpatient Medications on File Prior to Visit  Medication Sig Dispense Refill  . acyclovir (ZOVIRAX) 800 MG tablet Take 1 tablet daily for Fever Blister / Mouth Ulcers (Patient taking differently: Take 800 mg by mouth as needed. For Fever Blister / Mouth Ulcers) 90 tablet 0  . aspirin EC 81 MG tablet Take 1 tablet (81 mg total) by mouth daily. 90 tablet 3  . atenolol (TENORMIN) 50 MG tablet Take 1 tablet Daily for BP & Heart 90 tablet 3  . Blood Glucose Monitoring Suppl DEVI Use kit to check blood sugar once a day 1 each 0  . Cholecalciferol 5000 UNITS TABS Take 1 tablet by mouth daily.    . famotidine (PEPCID) 20 MG tablet Take 1 tablet (20 mg total) by mouth 2 (two) times daily as needed for heartburn  or indigestion. 180 tablet 1  . glucose blood (CONTOUR NEXT TEST) test strip Test blood sugar once daily 100 each 12  . Lancets MISC Check blood sugar once a day 100 each 0  . levothyroxine (SYNTHROID) 50 MCG tablet Take 1 tablet daily on an empty stomach with only water for 30 minutes & no Antacid meds, Calcium or Magnesium for 4 hours & avoid Biotin 90 tablet 3  . losartan (COZAAR) 25 MG tablet Take 0.5 tablets (12.5 mg total) by mouth at bedtime. (Patient taking differently: Take 12.5 mg by mouth daily. ) 45 tablet 3  . metFORMIN (GLUCOPHAGE-XR) 500 MG 24 hr tablet Take 2 tablets 2 x /day with Meals for Diabetes 360 tablet 3  . Multiple Vitamin (MULTIVITAMIN) tablet Take 1 tablet by mouth at bedtime.     . Omega-3 Fatty Acids (FISH OIL) 1000 MG CAPS Take 1,000 mg by mouth at bedtime.     . rosuvastatin (CRESTOR) 20 MG tablet Take 20 mg by mouth 2 (two) times a week.  No current facility-administered medications on file prior to visit.    Allergies: Allergies  Allergen Reactions  . Gemfibrozil Other (See Comments)    Muscle aches  . Ace Inhibitors Cough  . Lipitor [Atorvastatin] Other (See Comments)    Joint pain.    Current Problems (verified) has [name] w/Stage 2 CKD (GFR 73 ml/min); Hyperlipidemia associated with type 2 diabetes mellitus ([place]); Hypertension; GERD (gastroesophageal reflux disease); IBS (irritable bowel syndrome); Vitamin D deficiency; Medication management; CAD s/p CABG; Hypothyroidism; Atherosclerosis of abdominal aorta ([place]); CKD stage 2 due to type 2 diabetes mellitus ([place]); Overweight (BMI 25.0-29.9); Former smoker; and History of Mohs micrographic surgery for skin cancer on their problem list.  Screening Tests Immunization History  Administered Date(s) Administered  . DT (Pediatric) 08/29/2003, 06/04/2015  . Influenza Split 02/21/2019  . Influenza, High Dose Seasonal PF 04/03/2018  . Influenza-Unspecified 04/16/2014, 03/30/2016, 04/02/2017  . PFIZER  SARS-COV-2 Vaccination 07/16/2019, 08/12/2019  . Pneumococcal Conjugate-13 12/26/2013  . Pneumococcal Polysaccharide-23 01/29/2010  . Zoster 05/11/2010  . Zoster Recombinat (Shingrix) 01/31/2017, 07/05/2017   Preventative care: Last colonoscopy: 2005, 2018 following positive cologuard which was negative, DONE per GI Cologuard: 2018  CXR 02/2019  Prior vaccinations: TD or Tdap: 2016  Influenza: 02/2019 Pneumococcal: 2011 Prevnar13: 2015 Shingles/Zostavax: 2011, 2019 2/2 Covid 19: 2/2, 2021  Names of Other Physician/Practitioners you currently use: 1. [facility] here for primary care 2. Dr. Gershon Crane, eye doctor, last visit  - diabetic eye exam 05/07/2019 - report received and abstracted 3. Dr. Isac Caddy, dentist, last visit  2019, typically goes q27m overdue due to covid 19, will schedule follow up this year  Patient Care Team: MUnk Pinto MD as PCP - General (Internal Medicine) SRutherford Guys MD as Consulting Physician (Ophthalmology) EKristeen Miss MD as Consulting Physician (Neurosurgery) Croitoru, MDani Gobble MD as Consulting Physician (Cardiology) HTeena Irani MD (Inactive) as Consulting Physician (Gastroenterology)  Surgical: He  has a past surgical history that includes Cardiac surgery; Knee surgery (Right); Back surgery; Coronary angioplasty (2001); Coronary artery bypass graft; Surgery scrotal / testicular; Elbow surgery (Left); Colonoscopy; Lumbar laminectomy/decompression microdiscectomy (Right, 08/04/2014); and Cataract extraction, bilateral (Bilateral, 2018). Family His family history includes Arthritis in his mother; CAD in his brother and sister; Diabetes in his maternal grandmother; Heart attack in his brother and father; Hypertension in his brother; Ulcers in his mother. Social history  He reports that he quit smoking about 45 years ago. He has never used smokeless tobacco. He reports that he does not drink alcohol or use  drugs.  MEDICARE WELLNESS OBJECTIVES: Physical activity: Current Exercise Habits: Home exercise routine, Type of exercise: walking, Time (Minutes): 25, Frequency (Times/Week): 7, Weekly Exercise (Minutes/Week): 175, Intensity: Mild, Exercise limited by: None identified Cardiac risk factors: Cardiac Risk Factors include: advanced age (>557m, >6>55omen);dyslipidemia;hypertension;male gender;diabetes mellitus;smoking/ tobacco exposure Depression/mood screen:   [facility] 09/19/2019  Decreased Interest 0  Down, Depressed, Hopeless 0  PHQ - 2 Score 0    ADLs:  In your present state of health, do you have any difficulty performing the following activities: 09/19/2019 06/18/2019  Hearing? N N  Vision? N N  Difficulty concentrating or making decisions? N N  Walking or climbing stairs? N N  Dressing or bathing? N N  Doing errands, shopping? N N  Some recent data might be hidden     Cognitive Testing  Alert? Yes  Normal Appearance?Yes  Oriented to person? Yes  Place? Yes   Time? Yes  Recall of  three objects?  Yes  Can perform simple calculations? Yes  Displays appropriate judgment?Yes  Can read the correct time from a watch face?Yes  EOL planning: Does Patient Have a Medical Advance Directive?: Yes Type of Advance Directive: Living will Does patient want to make changes to medical advance directive?: No - Patient declined   Objective:   Today's Vitals   09/19/19 1002  BP: 138/72  Pulse: 61  Temp: (!) 96.8 F (36 C)  SpO2: 98%  Weight: 166 lb (75.3 kg)  Height: _0  (1.753 m)   Body mass index is 24.51 kg/m.  General appearance: alert, no distress, WD/WN, male HEENT: normocephalic, sclerae anicteric, TMs pearly, nares patent, no discharge or erythema, pharynx normal Oral cavity: MMM, no lesions Neck: supple, no lymphadenopathy, no thyromegaly, no masses Heart: RRR, normal S1, S2, no murmurs Lungs: CTA bilaterally, no wheezes, rhonchi, or rales Abdomen:  +bs, soft, non tender, non distended, no masses, no hepatomegaly, no splenomegaly Musculoskeletal: nontender, no swelling, no obvious deformity Extremities: no edema, no cyanosis, no clubbing Pulses: 2+ symmetric, upper 1+ diminished bilateral lower extremities, normal cap refill Neurological: alert, oriented x 3, CN2-12 intact, strength normal upper extremities and lower extremities, sensation normal throughout, DTRs 2+ throughout, no cerebellar signs, gait normal Psychiatric: normal affect, behavior normal, pleasant   Medicare Attestation I have personally reviewed: The patient's medical and social history Their use of alcohol, tobacco or illicit drugs Their current medications and supplements The patient's functional ability including ADLs,fall risks, home safety risks, cognitive, and hearing and visual impairment Diet and physical activities Evidence for depression or mood disorders  The patient's weight, height, BMI, and visual acuity have been recorded in the chart.  I have made referrals, counseling, and provided education to the patient based on review of the above and I have provided the patient with a written personalized care plan for preventive services.     Izora Ribas, NP   09/19/2019

## 2019-09-19 ENCOUNTER — Encounter: Payer: Self-pay | Admitting: Adult Health

## 2019-09-19 ENCOUNTER — Other Ambulatory Visit: Payer: Self-pay

## 2019-09-19 ENCOUNTER — Ambulatory Visit (INDEPENDENT_AMBULATORY_CARE_PROVIDER_SITE_OTHER): Payer: Medicare Other | Admitting: Adult Health

## 2019-09-19 VITALS — BP 138/72 | HR 61 | Temp 96.8°F | Ht 69.0 in | Wt 166.0 lb

## 2019-09-19 DIAGNOSIS — E039 Hypothyroidism, unspecified: Secondary | ICD-10-CM

## 2019-09-19 DIAGNOSIS — Z6824 Body mass index (BMI) 24.0-24.9, adult: Secondary | ICD-10-CM

## 2019-09-19 DIAGNOSIS — Z1159 Encounter for screening for other viral diseases: Secondary | ICD-10-CM | POA: Diagnosis not present

## 2019-09-19 DIAGNOSIS — K219 Gastro-esophageal reflux disease without esophagitis: Secondary | ICD-10-CM | POA: Diagnosis not present

## 2019-09-19 DIAGNOSIS — E559 Vitamin D deficiency, unspecified: Secondary | ICD-10-CM

## 2019-09-19 DIAGNOSIS — R6889 Other general symptoms and signs: Secondary | ICD-10-CM | POA: Diagnosis not present

## 2019-09-19 DIAGNOSIS — N182 Chronic kidney disease, stage 2 (mild): Secondary | ICD-10-CM

## 2019-09-19 DIAGNOSIS — E785 Hyperlipidemia, unspecified: Secondary | ICD-10-CM | POA: Diagnosis not present

## 2019-09-19 DIAGNOSIS — Z85828 Personal history of other malignant neoplasm of skin: Secondary | ICD-10-CM

## 2019-09-19 DIAGNOSIS — E1169 Type 2 diabetes mellitus with other specified complication: Secondary | ICD-10-CM | POA: Diagnosis not present

## 2019-09-19 DIAGNOSIS — I25810 Atherosclerosis of coronary artery bypass graft(s) without angina pectoris: Secondary | ICD-10-CM | POA: Diagnosis not present

## 2019-09-19 DIAGNOSIS — I10 Essential (primary) hypertension: Secondary | ICD-10-CM

## 2019-09-19 DIAGNOSIS — Z87891 Personal history of nicotine dependence: Secondary | ICD-10-CM | POA: Diagnosis not present

## 2019-09-19 DIAGNOSIS — Z0001 Encounter for general adult medical examination with abnormal findings: Secondary | ICD-10-CM | POA: Diagnosis not present

## 2019-09-19 DIAGNOSIS — K589 Irritable bowel syndrome without diarrhea: Secondary | ICD-10-CM | POA: Diagnosis not present

## 2019-09-19 DIAGNOSIS — Z0000 Encounter for general adult medical examination without abnormal findings: Secondary | ICD-10-CM

## 2019-09-19 DIAGNOSIS — E1122 Type 2 diabetes mellitus with diabetic chronic kidney disease: Secondary | ICD-10-CM

## 2019-09-19 DIAGNOSIS — I700 Atherosclerosis of aorta: Secondary | ICD-10-CM

## 2019-09-19 DIAGNOSIS — Z79899 Other long term (current) drug therapy: Secondary | ICD-10-CM

## 2019-09-19 DIAGNOSIS — Z9889 Other specified postprocedural states: Secondary | ICD-10-CM

## 2019-09-19 NOTE — Patient Instructions (Addendum)
  Joshua Higgins , Thank you for taking time to come for your Medicare Wellness Visit. I appreciate your ongoing commitment to your health goals. Please review the following plan we discussed and let me know if I can assist you in the future.   These are the goals we discussed: Goals    . Exercise 150 min/wk Moderate Activity    . LDL CALC < 70       This is a list of the screening recommended for you and due dates:  Health Maintenance  Topic Date Due  .  Hepatitis C: One time screening is recommended by Center for Disease Control  (CDC) for  adults born from [birth year] through 1967.   Never done  . Complete foot exam   12/06/2019  . Hemoglobin A1C  12/17/2019  . Eye exam for diabetics  05/06/2020  . Tetanus Vaccine  06/03/2025  . Colon Cancer Screening  09/08/2026  . Flu Shot  Completed  . Pneumonia vaccines  Completed    Please schedule a follow up with your dentist this year - should be going every 6 months

## 2019-09-20 ENCOUNTER — Encounter: Payer: Self-pay | Admitting: Adult Health

## 2019-09-20 DIAGNOSIS — D539 Nutritional anemia, unspecified: Secondary | ICD-10-CM | POA: Insufficient documentation

## 2019-09-20 LAB — CBC WITH DIFFERENTIAL/PLATELET
Absolute Monocytes: 403 cells/uL (ref 200–950)
Basophils Absolute: 29 cells/uL (ref 0–200)
Basophils Relative: 0.6 %
Eosinophils Absolute: 202 cells/uL (ref 15–500)
Eosinophils Relative: 4.2 %
HCT: 40.6 % (ref 38.5–50.0)
Hemoglobin: 13.3 g/dL (ref 13.2–17.1)
Lymphs Abs: 1973 cells/uL (ref 850–3900)
MCH: 33.8 pg — ABNORMAL HIGH (ref 27.0–33.0)
MCHC: 32.8 g/dL (ref 32.0–36.0)
MCV: 103.3 fL — ABNORMAL HIGH (ref 80.0–100.0)
MPV: 9.8 fL (ref 7.5–12.5)
Monocytes Relative: 8.4 %
Neutro Abs: 2194 cells/uL (ref 1500–7800)
Neutrophils Relative %: 45.7 %
Platelets: 176 10*3/uL (ref 140–400)
RBC: 3.93 10*6/uL — ABNORMAL LOW (ref 4.20–5.80)
RDW: 12 % (ref 11.0–15.0)
Total Lymphocyte: 41.1 %
WBC: 4.8 10*3/uL (ref 3.8–10.8)

## 2019-09-20 LAB — HEPATITIS C ANTIBODY
Hepatitis C Ab: NONREACTIVE
SIGNAL TO CUT-OFF: 0.01 (ref <1.00)

## 2019-09-20 LAB — COMPLETE METABOLIC PANEL WITH GFR
AG Ratio: 1.8 (calc) (ref 1.0–2.5)
ALT: 16 U/L (ref 9–46)
AST: 18 U/L (ref 10–35)
Albumin: 4.5 g/dL (ref 3.6–5.1)
Alkaline phosphatase (APISO): 45 U/L (ref 35–144)
BUN: 14 mg/dL (ref 7–25)
CO2: 30 mmol/L (ref 20–32)
Calcium: 9.9 mg/dL (ref 8.6–10.3)
Chloride: 104 mmol/L (ref 98–110)
Creat: 0.81 mg/dL (ref 0.70–1.18)
GFR, Est African American: 101 mL/min/{1.73_m2} (ref >=60)
GFR, Est Non African American: 87 mL/min/{1.73_m2} (ref >=60)
Globulin: 2.5 g/dL (calc) (ref 1.9–3.7)
Glucose, Bld: 111 mg/dL — ABNORMAL HIGH (ref 65–99)
Potassium: 4.8 mmol/L (ref 3.5–5.3)
Sodium: 142 mmol/L (ref 135–146)
Total Bilirubin: 0.6 mg/dL (ref 0.2–1.2)
Total Protein: 7 g/dL (ref 6.1–8.1)

## 2019-09-20 LAB — MAGNESIUM: Magnesium: 2.1 mg/dL (ref 1.5–2.5)

## 2019-09-20 LAB — LIPID PANEL
Cholesterol: 124 mg/dL (ref <200)
HDL: 48 mg/dL (ref >=40)
LDL Cholesterol (Calc): 58 mg/dL (calc)
Non-HDL Cholesterol (Calc): 76 mg/dL (calc) (ref <130)
Total CHOL/HDL Ratio: 2.6 (calc) (ref <5.0)
Triglycerides: 96 mg/dL (ref <150)

## 2019-09-20 LAB — HEMOGLOBIN A1C
Hgb A1c MFr Bld: 5.9 % of total Hgb — ABNORMAL HIGH (ref <5.7)
Mean Plasma Glucose: 123 (calc)
eAG (mmol/L): 6.8 (calc)

## 2019-09-20 LAB — TSH: TSH: 1.38 mIU/L (ref 0.40–4.50)

## 2019-09-24 DIAGNOSIS — R079 Chest pain, unspecified: Secondary | ICD-10-CM | POA: Diagnosis not present

## 2019-09-24 DIAGNOSIS — R0789 Other chest pain: Secondary | ICD-10-CM | POA: Diagnosis not present

## 2019-10-25 ENCOUNTER — Other Ambulatory Visit: Payer: Self-pay | Admitting: Adult Health

## 2019-11-02 ENCOUNTER — Other Ambulatory Visit: Payer: Self-pay | Admitting: Adult Health

## 2019-11-02 DIAGNOSIS — N182 Chronic kidney disease, stage 2 (mild): Secondary | ICD-10-CM

## 2019-11-02 DIAGNOSIS — E1122 Type 2 diabetes mellitus with diabetic chronic kidney disease: Secondary | ICD-10-CM

## 2019-11-06 ENCOUNTER — Encounter: Payer: Self-pay | Admitting: Internal Medicine

## 2019-12-16 ENCOUNTER — Other Ambulatory Visit: Payer: Self-pay | Admitting: Internal Medicine

## 2019-12-16 ENCOUNTER — Telehealth: Payer: Self-pay

## 2019-12-16 NOTE — Telephone Encounter (Signed)
Patient notified

## 2019-12-16 NOTE — Telephone Encounter (Signed)
Questioning whether or not he should still be taking Losartan or not. Please advise.

## 2019-12-23 ENCOUNTER — Encounter: Payer: Self-pay | Admitting: Internal Medicine

## 2019-12-23 NOTE — Patient Instructions (Signed)

## 2019-12-23 NOTE — Progress Notes (Signed)
Comprehensive Evaluation & Examination     This very nice 74 y.o. MWM presents for a  comprehensive evaluation and management of multiple medical co-morbidities.  Patient has been followed for HTN, ASHD / CABG, [name], [name],  Hypothyroidism and Vitamin D Deficiency. Patient has aortic atherosclerosis by CXR. Patient's GERD is controlled on his meds.     Today, patient is also c/o positional dizziness suspect for vertigo. No associated  nausea, visual changes and requests referral for further evaluation.      HTN predates circa 1989.  In 2001, patient had PCA / Stent and then in 2008 underwent CABG. He had a negative ETT in 2017 by Dr Sallyanne Kuster.  Patient's BP has been controlled at home.  Today's BP is at goal - 116/62. Patient denies any cardiac symptoms as chest pain, palpitations, shortness of breath, dizziness or ankle swelling.     Patient's hyperlipidemia is controlled with diet and  Low dose Rosuvastatin. Patient denies myalgias or other medication SE's. Last lipids were at goal:  Lab Results  Component Value Date   CHOL 124 09/19/2019   HDL 48 09/19/2019   LDLCALC 58 09/19/2019   TRIG 96 09/19/2019   CHOLHDL 2.6 09/19/2019       Patient has hx/o T2_NIDDMw/ CKD2 ( 2000)  and patient denies reactive hypoglycemic symptoms, visual blurring, diabetic polys or paresthesias. Last A1c was at goal:  Lab Results  Component Value Date   HGBA1C 5.9 (H) 09/19/2019       [name]     Finally, patient has history of Vitamin D Deficiency  ("33" / 2008) and last vitamin D was at goal:  Lab Results  Component Value Date   VD25OH 69 06/18/2019    Current Outpatient Medications on File Prior to Visit  Medication Sig  . acyclovir (ZOVIRAX) 800 MG tablet Take 1 tablet daily for Fever Blister / Mouth Ulcers (Patient taking differently: Take 800 mg by mouth as needed. For Fever Blister / Mouth Ulcers)  . aspirin EC 81 MG tablet  Take 1 tablet (81 mg total) by mouth daily.  Marland Kitchen atenolol (TENORMIN) 50 MG tablet Take 1 tablet Daily for BP & Heart  . Lexicographer BLOOD SUGAR ONCE A DAY  . Blood Glucose Monitoring Suppl DEVI Use kit to check blood sugar once a day  . Cholecalciferol 5000 UNITS TABS Take 1 tablet by mouth daily.  . CONTOUR NEXT TEST test strip USE TO TEST BLOOD SUGAR ONCE DAILY  . famotidine (PEPCID) 20 MG tablet Take 1 tablet (20 mg total) by mouth 2 (two) times daily as needed for heartburn or indigestion.  Marland Kitchen levothyroxine (SYNTHROID) 50 MCG tablet Take 1 tablet daily on an empty stomach with only water for 30 minutes & no Antacid meds, Calcium or Magnesium for 4 hours & avoid Biotin  . losartan (COZAAR) 25 MG tablet Take 0.5 tablets (12.5 mg total) by mouth at bedtime. (Patient taking differently: Take 12.5 mg by mouth daily. )  . metFORMIN (GLUCOPHAGE-XR) 500 MG 24 hr tablet Take 2 tablets 2 x /day with Meals for Diabetes  . Multiple Vitamin (MULTIVITAMIN) tablet Take 1 tablet by mouth at bedtime.   . Omega-3 Fatty Acids (FISH OIL) 1000 MG CAPS Take 1,000 mg by mouth at bedtime.   . rosuvastatin (CRESTOR) 10 MG tablet TAKE 1 TABLET 2 X /WEEK FOR CHOLESTEROL   No current facility-administered medications on file prior to visit.  Allergies  Allergen Reactions  . Gemfibrozil Other (See Comments)    Muscle aches  . Ace Inhibitors Cough  . Lipitor [Atorvastatin] Other (See Comments)    Joint pain.   Past Medical History:  Diagnosis Date  . Anemia    low iron  . Arthritis   . Cancer ([place])    skin cancer on face  . Cataracts, bilateral   . Coronary artery disease    CABG - 2008  . Diabetes mellitus without complication ([place])    type II  . GERD (gastroesophageal reflux disease)   . Hepatitis   . Hyperlipidemia   . Hypertension    pt states he does not have HTN, takes Atenolol for migraines  . Hypothyroidism   . Migraine headache with aura    takes Atenolol  . Sleep  apnea    does not use cpap   Health Maintenance  Topic Date Due  . INFLUENZA VACCINE  02/02/2020  . HEMOGLOBIN A1C  03/21/2020  . OPHTHALMOLOGY EXAM  05/06/2020  . FOOT EXAM  12/22/2020  . TETANUS/TDAP  06/03/2025  . COVID-19 Vaccine  Completed  . Hepatitis C Screening  Completed  . PNA vac Low Risk Adult  Completed   Immunization History  Administered Date(s) Administered  . DT (Pediatric) 08/29/2003, 06/04/2015  . Influenza Split 02/21/2019  . Influenza, High Dose Seasonal PF 04/03/2018  . Influenza-Unspecified 04/16/2014, 03/30/2016, 04/02/2017  . PFIZER SARS-COV-2 Vaccination 07/16/2019, 08/12/2019  . Pneumococcal Conjugate-13 12/26/2013  . Pneumococcal Polysaccharide-23 01/29/2010  . Zoster 05/11/2010  . Zoster Recombinat (Shingrix) 01/31/2017, 07/05/2017   Last Colon - 09/07/2016 - Neg - rtecommended no Repeat Colon due to age  Past Surgical History:  Procedure Laterality Date  . BACK SURGERY    . CARDIAC SURGERY     Bypass  . CATARACT EXTRACTION, BILATERAL Bilateral 2018   Dr. Gershon Crane  . COLONOSCOPY    . CORONARY ANGIOPLASTY  2001  . CORONARY ARTERY BYPASS GRAFT    . ELBOW SURGERY Left    due to damaged nerve  . KNEE SURGERY Right    arthroscopic  . LUMBAR LAMINECTOMY/DECOMPRESSION MICRODISCECTOMY Right 08/04/2014   Procedure: Right Lumbar four-five Microdiskectomy;  Surgeon: Kristeen Miss, MD;  Location: [facility];  Service: Neurosurgery;  Laterality: Right;  Right L4-5 Microdiskectomy  . SURGERY SCROTAL / TESTICULAR     as a child/teenager   Family History  Problem Relation Age of Onset  . Ulcers Mother   . Arthritis Mother   . Heart attack Father   . CAD Sister   . Heart attack Brother   . CAD Brother   . Hypertension Brother   . Diabetes Maternal Grandmother    Social History   Socioeconomic History  . Marital status: Married    Spouse name: Not on file  . Number of children: Not on file  . Years of education: Not on file  . Highest  education level: Not on file  Occupational History  . Not on file  Tobacco Use  . Smoking status: Former Smoker    Quit date: 10/01/1973    Years since quitting: 46.2  . Smokeless tobacco: Never Used  Substance and Sexual Activity  . Alcohol use: No  . Drug use: No  . Sexual activity: Not on file     ROS Constitutional: Denies fever, chills, weight loss/gain, headaches, insomnia,  night sweats or change in appetite. Does c/o fatigue. Eyes: Denies redness, blurred vision, diplopia, discharge, itchy or watery eyes.  ENT:  Denies discharge, congestion, post nasal drip, epistaxis, sore throat, earache, hearing loss, dental pain, Tinnitus, Vertigo, Sinus pain or snoring.  Cardio: Denies chest pain, palpitations, irregular heartbeat, syncope, dyspnea, diaphoresis, orthopnea, PND, claudication or edema Respiratory: denies cough, dyspnea, DOE, pleurisy, hoarseness, laryngitis or wheezing.  Gastrointestinal: Denies dysphagia, heartburn, reflux, water brash, pain, cramps, nausea, vomiting, bloating, diarrhea, constipation, hematemesis, melena, hematochezia, jaundice or hemorrhoids Genitourinary: Denies dysuria, frequency, urgency, nocturia, hesitancy, discharge, hematuria or flank pain Musculoskeletal: Denies arthralgia, myalgia, stiffness, Jt. Swelling, pain, limp or strain/sprain. Denies Falls. Skin: Denies puritis, rash, hives, warts, acne, eczema or change in skin lesion Neuro: No weakness, tremor, incoordination, spasms, paresthesia or pain Psychiatric: Denies confusion, memory loss or sensory loss. Denies Depression. Endocrine: Denies change in weight, skin, hair change, nocturia, and paresthesia, diabetic polys, visual blurring or hyper / hypo glycemic episodes.  Heme/Lymph: No excessive bleeding, bruising or enlarged lymph nodes.  Physical Exam  BP 116/62   Pulse 64   Temp (!) 97.4 F (36.3 C)   Resp 16   Ht _0  (1.753 m)   Wt 165 lb 3.2 oz (74.9 kg)   BMI 24.40 kg/m   General  Appearance: Well nourished and well groomed and in no apparent distress.  Eyes: PERRLA, EOMs, conjunctiva no swelling or erythema, normal fundi and vessels. Sinuses: No frontal/maxillary tenderness ENT/Mouth: EACs patent / TMs  nl. Nares clear without erythema, swelling, mucoid exudates. Oral hygiene is good. No erythema, swelling, or exudate. Tongue normal, non-obstructing. Tonsils not swollen or erythematous. Hearing normal.  Neck: Supple, thyroid not palpable. No bruits, nodes or JVD. Respiratory: Respiratory effort normal.  BS equal and clear bilateral without rales, rhonci, wheezing or stridor. Cardio: Heart sounds are normal with regular rate and rhythm and no murmurs, rubs or gallops. Peripheral pulses are normal and equal bilaterally without edema. No aortic or femoral bruits. Chest: symmetric with normal excursions and percussion.  Abdomen: Soft, with Nl bowel sounds. Nontender, no guarding, rebound, hernias, masses, or organomegaly.  Lymphatics: Non tender without lymphadenopathy.  Musculoskeletal: Full ROM all peripheral extremities, joint stability, 5/5 strength, and normal gait. Skin: Warm and dry without rashes, lesions, cyanosis, clubbing or  ecchymosis.  Neuro: Cranial nerves intact, reflexes equal bilaterally. Normal muscle tone, no cerebellar symptoms. Sensation intact.  Pysch: Alert and oriented X 3 with normal affect, insight and judgment appropriate.   Assessment and Plan  1. Essential hypertension  - EKG 12-Lead - [place], RETROPERITNL ABD,  LTD - Urinalysis, Routine w reflex microscopic - Microalbumin / creatinine urine ratio - CBC with Differential/Platelet - COMPLETE METABOLIC PANEL WITH GFR - Magnesium - TSH  2. Hyperlipidemia associated with type 2 diabetes mellitus ([place])  - EKG 12-Lead - [place], RETROPERITNL ABD,  LTD - Lipid panel - TSH  3. Type 2 diabetes mellitus with stage 2 chronic kidney disease,  without long-term current use of insulin (HCC)  - EKG  12-Lead - [place], RETROPERITNL ABD,  LTD - Urinalysis, Routine w reflex microscopic - Microalbumin / creatinine urine ratio - HM DIABETES FOOT EXAM - LOW EXTREMITY NEUR EXAM DOCUM - Hemoglobin A1c - Insulin, random  4. Vitamin D deficiency  - VITAMIN D 25 Hydroxy  5. Coronary artery disease involving coronary bypass graft of  native heart without angina pectoris  - EKG 12-Lead - Lipid panel  6. Hypothyroidism  - TSH  7. Gastroesophageal reflux disease  - CBC with Differential/Platelet  8. Benign prostatic hyperplasia with urinary obstruction  - PSA  9. Screening for colorectal  cancer  - POC Hemoccult Bld/Stl  10. Prostate cancer screening  - PSA  11. Screening for ischemic heart disease  - EKG 12-Lead  12. FHx: heart disease  - EKG 12-Lead - [place], RETROPERITNL ABD,  LTD  13. Former smoker  - EKG 12-Lead - [place], RETROPERITNL ABD,  LTD  14. Atherosclerosis of abdominal aorta (HCC)  - [place], RETROPERITNL ABD,  LTD - Lipid panel  15. Screening for AAA (aortic abdominal aneurysm)  - [place], RETROPERITNL ABD,  LTD  16. Medication management  - Urinalysis, Routine w reflex microscopic - Microalbumin / creatinine urine ratio - CBC with Differential/Platelet - COMPLETE METABOLIC PANEL WITH GFR - Magnesium - Lipid panel - TSH - Hemoglobin A1c - Insulin, random - VITAMIN D 25 Hydroxy        Patient was counseled in prudent diet, weight control to achieve/maintain BMI less than 25, BP monitoring, regular exercise and medications as discussed.  Discussed med effects and SE's. Routine screening labs and tests as requested with regular follow-up as recommended. Over 40 minutes of exam, counseling, chart review and high complex critical decision making was performed   Kirtland Bouchard, MD

## 2019-12-24 ENCOUNTER — Other Ambulatory Visit: Payer: Self-pay

## 2019-12-24 ENCOUNTER — Ambulatory Visit (INDEPENDENT_AMBULATORY_CARE_PROVIDER_SITE_OTHER): Payer: Medicare Other | Admitting: Internal Medicine

## 2019-12-24 VITALS — BP 116/62 | HR 64 | Temp 97.4°F | Resp 16 | Ht 69.0 in | Wt 165.2 lb

## 2019-12-24 DIAGNOSIS — K219 Gastro-esophageal reflux disease without esophagitis: Secondary | ICD-10-CM

## 2019-12-24 DIAGNOSIS — N138 Other obstructive and reflux uropathy: Secondary | ICD-10-CM | POA: Diagnosis not present

## 2019-12-24 DIAGNOSIS — R42 Dizziness and giddiness: Secondary | ICD-10-CM

## 2019-12-24 DIAGNOSIS — E559 Vitamin D deficiency, unspecified: Secondary | ICD-10-CM | POA: Diagnosis not present

## 2019-12-24 DIAGNOSIS — Z136 Encounter for screening for cardiovascular disorders: Secondary | ICD-10-CM

## 2019-12-24 DIAGNOSIS — E1169 Type 2 diabetes mellitus with other specified complication: Secondary | ICD-10-CM

## 2019-12-24 DIAGNOSIS — Z87891 Personal history of nicotine dependence: Secondary | ICD-10-CM | POA: Diagnosis not present

## 2019-12-24 DIAGNOSIS — I25810 Atherosclerosis of coronary artery bypass graft(s) without angina pectoris: Secondary | ICD-10-CM | POA: Diagnosis not present

## 2019-12-24 DIAGNOSIS — E1122 Type 2 diabetes mellitus with diabetic chronic kidney disease: Secondary | ICD-10-CM | POA: Diagnosis not present

## 2019-12-24 DIAGNOSIS — Z125 Encounter for screening for malignant neoplasm of prostate: Secondary | ICD-10-CM

## 2019-12-24 DIAGNOSIS — I700 Atherosclerosis of aorta: Secondary | ICD-10-CM | POA: Diagnosis not present

## 2019-12-24 DIAGNOSIS — N401 Enlarged prostate with lower urinary tract symptoms: Secondary | ICD-10-CM

## 2019-12-24 DIAGNOSIS — E785 Hyperlipidemia, unspecified: Secondary | ICD-10-CM | POA: Diagnosis not present

## 2019-12-24 DIAGNOSIS — I10 Essential (primary) hypertension: Secondary | ICD-10-CM

## 2019-12-24 DIAGNOSIS — N182 Chronic kidney disease, stage 2 (mild): Secondary | ICD-10-CM | POA: Diagnosis not present

## 2019-12-24 DIAGNOSIS — Z8249 Family history of ischemic heart disease and other diseases of the circulatory system: Secondary | ICD-10-CM

## 2019-12-24 DIAGNOSIS — E039 Hypothyroidism, unspecified: Secondary | ICD-10-CM

## 2019-12-24 DIAGNOSIS — Z79899 Other long term (current) drug therapy: Secondary | ICD-10-CM | POA: Diagnosis not present

## 2019-12-24 DIAGNOSIS — Z1211 Encounter for screening for malignant neoplasm of colon: Secondary | ICD-10-CM

## 2019-12-25 LAB — COMPLETE METABOLIC PANEL WITH GFR
AG Ratio: 1.8 (calc) (ref 1.0–2.5)
ALT: 14 U/L (ref 9–46)
AST: 16 U/L (ref 10–35)
Albumin: 4.3 g/dL (ref 3.6–5.1)
Alkaline phosphatase (APISO): 46 U/L (ref 35–144)
BUN: 17 mg/dL (ref 7–25)
CO2: 27 mmol/L (ref 20–32)
Calcium: 10 mg/dL (ref 8.6–10.3)
Chloride: 102 mmol/L (ref 98–110)
Creat: 0.85 mg/dL (ref 0.70–1.18)
GFR, Est African American: 98 mL/min/{1.73_m2} (ref >=60)
GFR, Est Non African American: 85 mL/min/{1.73_m2} (ref >=60)
Globulin: 2.4 g/dL (calc) (ref 1.9–3.7)
Glucose, Bld: 88 mg/dL (ref 65–99)
Potassium: 4.9 mmol/L (ref 3.5–5.3)
Sodium: 140 mmol/L (ref 135–146)
Total Bilirubin: 0.3 mg/dL (ref 0.2–1.2)
Total Protein: 6.7 g/dL (ref 6.1–8.1)

## 2019-12-25 LAB — URINALYSIS, ROUTINE W REFLEX MICROSCOPIC
Bilirubin Urine: NEGATIVE
Glucose, UA: NEGATIVE
Hgb urine dipstick: NEGATIVE
Ketones, ur: NEGATIVE
Leukocytes,Ua: NEGATIVE
Nitrite: NEGATIVE
Protein, ur: NEGATIVE
Specific Gravity, Urine: 1.013 (ref 1.001–1.03)
pH: 5.5 (ref 5.0–8.0)

## 2019-12-25 LAB — CBC WITH DIFFERENTIAL/PLATELET
Absolute Monocytes: 407 cells/uL (ref 200–950)
Basophils Absolute: 29 cells/uL (ref 0–200)
Basophils Relative: 0.6 %
Eosinophils Absolute: 240 cells/uL (ref 15–500)
Eosinophils Relative: 4.9 %
HCT: 38.2 % — ABNORMAL LOW (ref 38.5–50.0)
Hemoglobin: 12.8 g/dL — ABNORMAL LOW (ref 13.2–17.1)
Lymphs Abs: 2274 cells/uL (ref 850–3900)
MCH: 34.9 pg — ABNORMAL HIGH (ref 27.0–33.0)
MCHC: 33.5 g/dL (ref 32.0–36.0)
MCV: 104.1 fL — ABNORMAL HIGH (ref 80.0–100.0)
MPV: 9.9 fL (ref 7.5–12.5)
Monocytes Relative: 8.3 %
Neutro Abs: 1950 cells/uL (ref 1500–7800)
Neutrophils Relative %: 39.8 %
Platelets: 159 10*3/uL (ref 140–400)
RBC: 3.67 10*6/uL — ABNORMAL LOW (ref 4.20–5.80)
RDW: 12.2 % (ref 11.0–15.0)
Total Lymphocyte: 46.4 %
WBC: 4.9 10*3/uL (ref 3.8–10.8)

## 2019-12-25 LAB — HEMOGLOBIN A1C
Hgb A1c MFr Bld: 6 % of total Hgb — ABNORMAL HIGH (ref <5.7)
Mean Plasma Glucose: 126 (calc)
eAG (mmol/L): 7 (calc)

## 2019-12-25 LAB — VITAMIN D 25 HYDROXY (VIT D DEFICIENCY, FRACTURES): Vit D, 25-Hydroxy: 68 ng/mL (ref 30–100)

## 2019-12-25 LAB — MICROALBUMIN / CREATININE URINE RATIO
Creatinine, Urine: 65 mg/dL (ref 20–320)
Microalb Creat Ratio: 9 mcg/mg creat (ref <30)
Microalb, Ur: 0.6 mg/dL

## 2019-12-25 LAB — LIPID PANEL
Cholesterol: 129 mg/dL (ref <200)
HDL: 45 mg/dL (ref >=40)
LDL Cholesterol (Calc): 58 mg/dL (calc)
Non-HDL Cholesterol (Calc): 84 mg/dL (calc) (ref <130)
Total CHOL/HDL Ratio: 2.9 (calc) (ref <5.0)
Triglycerides: 183 mg/dL — ABNORMAL HIGH (ref <150)

## 2019-12-25 LAB — INSULIN, RANDOM: Insulin: 8.2 u[IU]/mL

## 2019-12-25 LAB — TSH: TSH: 1.43 mIU/L (ref 0.40–4.50)

## 2019-12-25 LAB — MAGNESIUM: Magnesium: 2 mg/dL (ref 1.5–2.5)

## 2019-12-25 LAB — PSA: PSA: 0.5 ng/mL (ref <=4.0)

## 2019-12-25 NOTE — Progress Notes (Signed)
==========================================================  -    PSA- Low - Great  ==========================================================  -  Total Chol = 129  and LDL = 58  - Wonderful - Both Excellent  - Very low risk for Heart Attack  / Stroke ==========================================================  -  A1c = 6.0% - Blood sugar and A1c are still elevated in  the borderline and early or pre-diabetes range which has   the same 300% increased risk for heart attack, stroke, cancer  and alzheimer- type vascular dementia as full blown diabetes.   But the good news is that diet, exercise with  weight loss can cure the early diabetes at this point. ==========================================================  -  Vitamin D = 68 - Excellent  ==========================================================  -  All Else - CBC - Kidneys - Electrolytes -  Liver - Magnesium & Thyroid   - all  Normal / OK ==========================================================

## 2020-01-23 ENCOUNTER — Ambulatory Visit (INDEPENDENT_AMBULATORY_CARE_PROVIDER_SITE_OTHER): Payer: Medicare Other | Admitting: Otolaryngology

## 2020-01-23 ENCOUNTER — Other Ambulatory Visit: Payer: Self-pay

## 2020-01-23 DIAGNOSIS — I25810 Atherosclerosis of coronary artery bypass graft(s) without angina pectoris: Secondary | ICD-10-CM | POA: Diagnosis not present

## 2020-01-23 DIAGNOSIS — H02832 Dermatochalasis of right lower eyelid: Secondary | ICD-10-CM | POA: Diagnosis not present

## 2020-01-23 DIAGNOSIS — H02835 Dermatochalasis of left lower eyelid: Secondary | ICD-10-CM | POA: Diagnosis not present

## 2020-01-23 DIAGNOSIS — H57813 Brow ptosis, bilateral: Secondary | ICD-10-CM | POA: Diagnosis not present

## 2020-01-23 DIAGNOSIS — H6123 Impacted cerumen, bilateral: Secondary | ICD-10-CM | POA: Diagnosis not present

## 2020-01-23 DIAGNOSIS — Z85828 Personal history of other malignant neoplasm of skin: Secondary | ICD-10-CM | POA: Diagnosis not present

## 2020-01-23 DIAGNOSIS — H02831 Dermatochalasis of right upper eyelid: Secondary | ICD-10-CM | POA: Diagnosis not present

## 2020-01-23 DIAGNOSIS — R42 Dizziness and giddiness: Secondary | ICD-10-CM | POA: Diagnosis not present

## 2020-01-23 DIAGNOSIS — H02834 Dermatochalasis of left upper eyelid: Secondary | ICD-10-CM | POA: Diagnosis not present

## 2020-01-23 NOTE — Progress Notes (Signed)
HPI: Joshua Higgins is a 74 y.o. male who presents is referred by Dr. Oneta Rack for evaluation of vertigo.  Patient complains mostly of pressure in the head and dizziness that he has had for years.  More recently it seems to be gotten worse.  But he is always stated that he has occasional dizziness when he first stands up and moves about.  He has not noted any change in his hearing.  He does not really describe actual vertigo or spinning.  He has history of allergies and pressure in the head that comes and goes but no actual drainage from his nose.Marland Kitchen  Past Medical History:  Diagnosis Date  . Anemia    low iron  . Arthritis   . Cancer (HCC)    skin cancer on face  . Cataracts, bilateral   . Coronary artery disease    CABG - 2008  . Diabetes mellitus without complication ([facility])    type II  . GERD (gastroesophageal reflux disease)   . Hepatitis   . Hyperlipidemia   . Hypertension    pt states he does not have HTN, takes Atenolol for migraines  . Hypothyroidism   . Migraine headache with aura    takes Atenolol  . Sleep apnea    does not use cpap   Past Surgical History:  Procedure Laterality Date  . BACK SURGERY    . CARDIAC SURGERY     Bypass  . CATARACT EXTRACTION, BILATERAL Bilateral 2018   Dr. Nile Riggs  . COLONOSCOPY    . CORONARY ANGIOPLASTY  2001  . CORONARY ARTERY BYPASS GRAFT    . ELBOW SURGERY Left    due to damaged nerve  . KNEE SURGERY Right    arthroscopic  . LUMBAR LAMINECTOMY/DECOMPRESSION MICRODISCECTOMY Right 08/04/2014   Procedure: Right Lumbar four-five Microdiskectomy;  Surgeon: Barnett Abu, MD;  Location: [facility];  Service: Neurosurgery;  Laterality: Right;  Right L4-5 Microdiskectomy  . SURGERY SCROTAL / TESTICULAR     as a child/teenager   Social History   Socioeconomic History  . Marital status: Married    Spouse name: Not on file  . Number of children: Not on file  . Years of education: Not on file  . Highest education level: Not on file   Occupational History  . Not on file  Tobacco Use  . Smoking status: Former Smoker    Quit date: 10/01/1973    Years since quitting: 46.3  . Smokeless tobacco: Never Used  Substance and Sexual Activity  . Alcohol use: No  . Drug use: No  . Sexual activity: Not on file  Other Topics Concern  . Not on file  Social History Narrative  . Not on file   Social Determinants of Health   Financial Resource Strain:   . Difficulty of Paying Living Expenses:   Food Insecurity:   . Worried About Programme researcher, broadcasting/film/video in the Last Year:   . Barista in the Last Year:   Transportation Needs:   . Freight forwarder (Medical):   Marland Kitchen Lack of Transportation (Non-Medical):   Physical Activity:   . Days of Exercise per Week:   . Minutes of Exercise per Session:   Stress:   . Feeling of Stress :   Social Connections:   . Frequency of Communication with Friends and Family:   . Frequency of Social Gatherings with Friends and Family:   . Attends Religious Services:   . Active Member of Clubs  or Organizations:   . Attends Archivist Meetings:   Marland Kitchen Marital Status:    Family History  Problem Relation Age of Onset  . Ulcers Mother   . Arthritis Mother   . Heart attack Father   . CAD Sister   . Heart attack Brother   . CAD Brother   . Hypertension Brother   . Diabetes Maternal Grandmother    Allergies  Allergen Reactions  . Gemfibrozil Other (See Comments)    Muscle aches  . Ace Inhibitors Cough  . Lipitor [Atorvastatin] Other (See Comments)    Joint pain.   Prior to Admission medications   Medication Sig Start Date End Date Taking? Authorizing Provider  acyclovir (ZOVIRAX) 800 MG tablet Take 1 tablet daily for Fever Blister / Mouth Ulcers Patient taking differently: Take 800 mg by mouth as needed. For Fever Blister / Mouth Ulcers 07/30/18   Unk Pinto, MD  aspirin EC 81 MG tablet Take 1 tablet (81 mg total) by mouth daily. 02/13/19   Croitoru, Mihai, MD  atenolol  (TENORMIN) 50 MG tablet Take 1 tablet Daily for BP & Heart 02/25/19   Unk Pinto, MD  Bayer Microlet Lancets lancets CHECK BLOOD SUGAR ONCE A DAY 11/02/19   Vicie Mutters, PA-C  Blood Glucose Monitoring Suppl DEVI Use kit to check blood sugar once a day 09/03/18   Liane Comber, NP  Cholecalciferol 5000 UNITS TABS Take 1 tablet by mouth daily.    [provider]  CONTOUR NEXT TEST test strip USE TO TEST BLOOD SUGAR ONCE DAILY 11/02/19   Vicie Mutters, PA-C  famotidine (PEPCID) 20 MG tablet Take 1 tablet (20 mg total) by mouth 2 (two) times daily as needed for heartburn or indigestion. 03/12/19 03/11/20  Liane Comber, NP  levothyroxine (SYNTHROID) 50 MCG tablet Take 1 tablet daily on an empty stomach with only water for 30 minutes & no Antacid meds, Calcium or Magnesium for 4 hours & avoid Biotin 07/28/19   Unk Pinto, MD  losartan (COZAAR) 25 MG tablet Take 0.5 tablets (12.5 mg total) by mouth at bedtime. Patient taking differently: Take 12.5 mg by mouth daily.  02/13/19   Croitoru, Mihai, MD  metFORMIN (GLUCOPHAGE-XR) 500 MG 24 hr tablet Take 2 tablets 2 x /day with Meals for Diabetes 05/17/19   Unk Pinto, MD  Multiple Vitamin (MULTIVITAMIN) tablet Take 1 tablet by mouth at bedtime.     [provider]  Omega-3 Fatty Acids (FISH OIL) 1000 MG CAPS Take 1,000 mg by mouth at bedtime.     [provider]  rosuvastatin (CRESTOR) 10 MG tablet TAKE 1 TABLET 2 X /WEEK FOR CHOLESTEROL 12/16/19   Unk Pinto, MD     Positive ROS: Otherwise negative  All other systems have been reviewed and were otherwise negative with the exception of those mentioned in the HPI and as above.  Physical Exam: Constitutional: Alert, well-appearing, no acute distress Ears: External ears without lesions or tenderness.  Right ear canal is mostly clear with a clear right TM.  Left ear canal is completely occluded with cerumen that was removed in the office using curettes  hydroperoxide and suction.  After cleaning the ear canals both TMs were clear with good mobility on pneumatic otoscopy.  Hearing screening with the 512 1024 tuning fork revealed good hearing in both ears.  On Dix-Hallpike testing he had no evidence of benign positional vertigo. Nasal: External nose without lesions. Septum is deviated to the left with mild rhinitis.  After  decongesting the nose both middle meatus regions were clear with no signs of infection no polyps..  Oral: Lips and gums without lesions. Tongue and palate mucosa without lesions. Posterior oropharynx clear. Neck: No palpable adenopathy or masses Respiratory: Breathing comfortably  Skin: No facial/neck lesions or rash noted.  Cerumen impaction removal  Date/Time: 01/23/2020 2:17 PM Performed by: Rozetta Nunnery, MD Authorized by: Rozetta Nunnery, MD   Consent:    Consent obtained:  Verbal   Consent given by:  Patient   Risks discussed:  Pain and bleeding Procedure details:    Location:  L ear and R ear   Procedure type: curette, suction and forceps   Post-procedure details:    Inspection:  TM intact and canal normal   Hearing quality:  Improved   Patient tolerance of procedure:  Tolerated well, no immediate complications Comments:     TMs are clear bilaterally.    Assessment: Head pressure most likely related to mild sinus congestion but presently no evidence of active infection or obstruction. Dizziness I suspect is probably more of a vestibular weakness or chronic vestibular problem.  Presently no evidence of BPPV.  Plan: Recommended continue with his present activity [place]  Would recommend when he is walking to use a cane or stick if he loses balance so he does not fall or injure himself.  Could consider vestibular rehab with balance problems get worse. Also prescribed Nasacort 2 sprays each nostril to try for a month to see if this helps at  all with pressure in his head.   Radene Journey, MD   CC:

## 2020-02-06 ENCOUNTER — Other Ambulatory Visit: Payer: Self-pay | Admitting: *Deleted

## 2020-02-06 DIAGNOSIS — Z1212 Encounter for screening for malignant neoplasm of rectum: Secondary | ICD-10-CM

## 2020-02-06 DIAGNOSIS — Z1211 Encounter for screening for malignant neoplasm of colon: Secondary | ICD-10-CM | POA: Diagnosis not present

## 2020-02-06 LAB — POC HEMOCCULT BLD/STL (HOME/3-CARD/SCREEN)
Card #2 Fecal Occult Blod, POC: NEGATIVE
Card #3 Fecal Occult Blood, POC: NEGATIVE
Fecal Occult Blood, POC: NEGATIVE

## 2020-03-05 DIAGNOSIS — Z23 Encounter for immunization: Secondary | ICD-10-CM | POA: Diagnosis not present

## 2020-03-14 ENCOUNTER — Other Ambulatory Visit: Payer: Self-pay | Admitting: Cardiovascular Disease

## 2020-03-26 DIAGNOSIS — E538 Deficiency of other specified B group vitamins: Secondary | ICD-10-CM | POA: Insufficient documentation

## 2020-03-26 NOTE — Progress Notes (Signed)
FOLLOW UP  Assessment and Plan:   Essential hypertension Well controlled; continue medication Monitor blood pressure at home; call if consistently over 130/80 Continue DASH diet.   Reminder to go to the ER if any CP, SOB, nausea, dizziness, severe HA, changes vision/speech, left arm numbness and tingling and jaw pain.  Coronary artery disease involving coronary bypass graft of native heart, Other angina ([place]) very infrequent CCS class II exertional angina pectoris; continue BB; followed by cardiology (Dr. Sallyanne Kuster) Unremarkable CTA in 02/2019 Continue ASA, close management of cholesterol   Type 2 diabetes mellitus with stage 2 chronic kidney disease, without long-term current use of insulin (HCC) Continue metformin Continue diet and exercise.  Perform daily foot/skin check, notify office of any concerning changes.  -     Hemoglobin A1c  Hypothyroidism, unspecified type Continue synthroid reminded to take on an empty stomach 30-13mns before food.  -     TSH  Mixed hyperlipidemia Tolerating low dose rosuvastatin; LDL goal <70 Continue low cholesterol diet and exercise.  -     Lipid panel  Vitamin D deficiency Continue supplementation At goal at last visit; defer checking level  Macrocytic anemia/B12 deficiency Never started on sublingual B12; discussed adding 1000 mcg daily  Defer recheck to next visit  Left foot subungal infection/diabetic foot wound ABX, soak with epson, if not improving or resolved follow up in the office.   Continue diet and meds as discussed. Further disposition pending results of labs. Discussed med's effects and SE's.   Over 30 minutes of exam, counseling, chart review, and critical decision making was performed.   Future Appointments  Date Time [place] 04/03/2020  8:40 AM Croitoru, MDani Gobble [facility] 06/30/2020  2:30 PM MUnk Pinto MD [facility] None  10/01/2020 10:00 AM CLiane Comber NP [facility] None   12/24/2020  3:00 PM MUnk Pinto MD [facility] None    ----------------------------------------------------------------------------------------------------------------------  HPI 74y.o. male  presents for 3 month follow up on hypertension, cholesterol, diabetes, weight and vitamin D deficiency.   He reports woke up this AM, noted tenderness of the left 4th toe, noted his nail was loose, noted purulent oozing from base of nail and 1/2 clipped off. Can't recall any injury.   BMI is Body mass index is 25.25 kg/m., he has been working on diet and reports he is active throughout the day, doesn't sit much, but not intentionally exercising.  Wt Readings from Last 3 Encounters:  03/27/20 171 lb (77.6 kg)  12/24/19 165 lb 3.2 oz (74.9 kg)  09/19/19 166 lb (75.3 kg)   Patient has ASCAD with stenting in 2001, CABG 2008 with very infrequent CCS class II exertional angina pectoris and followed by Dr. CSallyanne KusterAortic atherosclerosis per CXR in 2017. ECHO 02/2019 showed LVEF 60-65%.   His blood pressure has been controlled at home (115-130/60-70 at home), today their BP is BP: 128/68  He does not workout though very active around his property. He denies chest pain, shortness of breath, dizziness.    He is on cholesterol medication (rosuvastatin 20 mg twice weekly, hx of myalgias with daily dosing) and denies myalgias. His cholesterol is at goal. The cholesterol last visit was:   Lab Results  Component Value Date   CHOL 129 12/24/2019   HDL 45 12/24/2019   LDLCALC 58 12/24/2019   TRIG 183 (H) 12/24/2019   CHOLHDL 2.9 12/24/2019    He has been working on diet for T2 diabetes on meformin (2000 mg daily), and denies  foot ulcerations, increased appetite, nausea, paresthesia of the feet, polydipsia, polyuria, visual disturbances, vomiting and weight loss. He does check fasting sugars, recently consistently running in 110-115. Last A1C in the office was:  Lab Results  Component Value Date   HGBA1C  6.0 (H) 12/24/2019   CKD II associated with T2DM on losartan Lab Results  Component Value Date   GFRNONAA 38 12/24/2019   He is on thyroid medication. His medication was not changed last visit.   Lab Results  Component Value Date   TSH 1.43 12/24/2019   Patient is on Vitamin D supplement.   Lab Results  Component Value Date   VD25OH 68 12/24/2019     He has persistent anemia worse at last check; macrocytic/hyperchromic, had normal iron, folate on 03/12/2019, B12 was low and recommended sublingual B12 last year but admits forgot and never started Lab Results  Component Value Date   WBC 4.9 12/24/2019   HGB 12.8 (L) 12/24/2019   HCT 38.2 (L) 12/24/2019   MCV 104.1 (H) 12/24/2019   PLT 159 12/24/2019   Lab Results  Component Value Date   VITAMINB12 380 03/12/2019    Current Medications:  Current Outpatient Medications on File Prior to Visit  Medication Sig   acyclovir (ZOVIRAX) 800 MG tablet Take 1 tablet daily for Fever Blister / Mouth Ulcers (Patient taking differently: Take 800 mg by mouth as needed. For Fever Blister / Mouth Ulcers)   aspirin EC 81 MG tablet Take 1 tablet (81 mg total) by mouth daily.   atenolol (TENORMIN) 50 MG tablet Take 1 tablet Daily for BP & Heart   Bayer Microlet Lancets lancets CHECK BLOOD SUGAR ONCE A DAY   Blood Glucose Monitoring Suppl DEVI Use kit to check blood sugar once a day   Cholecalciferol 5000 UNITS TABS Take 1 tablet by mouth daily.   CONTOUR NEXT TEST test strip USE TO TEST BLOOD SUGAR ONCE DAILY   levothyroxine (SYNTHROID) 50 MCG tablet Take 1 tablet daily on an empty stomach with only water for 30 minutes & no Antacid meds, Calcium or Magnesium for 4 hours & avoid Biotin   losartan (COZAAR) 25 MG tablet TAKE 1/2 TABLET BY MOUTH AT BEDTIME   metFORMIN (GLUCOPHAGE-XR) 500 MG 24 hr tablet Take 2 tablets 2 x /day with Meals for Diabetes   Multiple Vitamin (MULTIVITAMIN) tablet Take 1 tablet by mouth at bedtime.    Omega-3  Fatty Acids (FISH OIL) 1000 MG CAPS Take 1,000 mg by mouth at bedtime.    rosuvastatin (CRESTOR) 10 MG tablet TAKE 1 TABLET 2 X /WEEK FOR CHOLESTEROL   famotidine (PEPCID) 20 MG tablet Take 1 tablet (20 mg total) by mouth 2 (two) times daily as needed for heartburn or indigestion.   No current facility-administered medications on file prior to visit.     Allergies:  Allergies  Allergen Reactions   Gemfibrozil Other (See Comments)    Muscle aches   Ace Inhibitors Cough   Lipitor [Atorvastatin] Other (See Comments)    Joint pain.     Medical History:  Past Medical History:  Diagnosis Date   Anemia    low iron   Arthritis    Cancer ([place])    skin cancer on face   Cataracts, bilateral    Coronary artery disease    CABG - 2008   Diabetes mellitus without complication ([place])    type II   GERD (gastroesophageal reflux disease)    Hepatitis    Hyperlipidemia  Hypertension    pt states he does not have HTN, takes Atenolol for migraines   Hypothyroidism    Migraine headache with aura    takes Atenolol   Sleep apnea    does not use cpap   Family history- Reviewed and unchanged Social history- Reviewed and unchanged   Review of Systems:  Review of Systems  Constitutional: Negative for malaise/fatigue and weight loss.  HENT: Negative for hearing loss and tinnitus.   Eyes: Negative for blurred vision and double vision.  Respiratory: Negative for cough, shortness of breath and wheezing.   Cardiovascular: Negative for chest pain, palpitations, orthopnea, claudication and leg swelling.  Gastrointestinal: Negative for abdominal pain, blood in stool, constipation, diarrhea, heartburn, melena, nausea and vomiting.  Genitourinary: Negative.   Musculoskeletal: Negative for joint pain and myalgias.  Skin: Negative for rash.  Neurological: Negative for dizziness, tingling, sensory change, weakness and headaches.  Endo/Heme/Allergies: Negative for polydipsia.   Psychiatric/Behavioral: Negative.   All other systems reviewed and are negative.     Physical Exam: BP 128/68    Pulse 64    Temp (!) 97.2 F (36.2 C)    Wt 171 lb (77.6 kg)    SpO2 96%    BMI 25.25 kg/m  Wt Readings from Last 3 Encounters:  03/27/20 171 lb (77.6 kg)  12/24/19 165 lb 3.2 oz (74.9 kg)  09/19/19 166 lb (75.3 kg)   General Appearance: Well nourished, in no apparent distress. Eyes: PERRLA, EOMs, conjunctiva no swelling or erythema Sinuses: No Frontal/maxillary tenderness ENT/Mouth: Ext aud canals clear, TMs without erythema, bulging. No erythema, swelling, or exudate on post pharynx.  Tonsils not swollen or erythematous. Hearing normal.  Neck: Supple, thyroid normal.  Respiratory: Respiratory effort normal, BS equal bilaterally without rales, rhonchi, wheezing or stridor.  Cardio: RRR with no MRGs. Brisk peripheral pulses without edema.  Abdomen: Soft, + BS.  Non tender, no guarding, rebound, hernias, masses. Lymphatics: Non tender without lymphadenopathy.  Musculoskeletal: Full ROM, 5/5 strength, Normal gait Skin: Warm, dry without rashes,  ecchymosis. Left foot 4th toe with nail half missing to base, erythema at base, scant purulent discharge from base of nail Neuro: Cranial nerves intact. No cerebellar symptoms.  Psych: Awake and oriented X 3, normal affect, Insight and Judgment appropriate.    Izora Ribas, NP 10:48 AM [facility]

## 2020-03-27 ENCOUNTER — Encounter: Payer: Self-pay | Admitting: Adult Health

## 2020-03-27 ENCOUNTER — Ambulatory Visit (INDEPENDENT_AMBULATORY_CARE_PROVIDER_SITE_OTHER): Payer: Medicare Other | Admitting: Adult Health

## 2020-03-27 ENCOUNTER — Other Ambulatory Visit: Payer: Self-pay

## 2020-03-27 VITALS — BP 128/68 | HR 64 | Temp 97.2°F | Wt 171.0 lb

## 2020-03-27 DIAGNOSIS — I25810 Atherosclerosis of coronary artery bypass graft(s) without angina pectoris: Secondary | ICD-10-CM | POA: Diagnosis not present

## 2020-03-27 DIAGNOSIS — L03032 Cellulitis of left toe: Secondary | ICD-10-CM | POA: Insufficient documentation

## 2020-03-27 DIAGNOSIS — E11628 Type 2 diabetes mellitus with other skin complications: Secondary | ICD-10-CM

## 2020-03-27 DIAGNOSIS — E559 Vitamin D deficiency, unspecified: Secondary | ICD-10-CM

## 2020-03-27 DIAGNOSIS — Z6824 Body mass index (BMI) 24.0-24.9, adult: Secondary | ICD-10-CM

## 2020-03-27 DIAGNOSIS — I10 Essential (primary) hypertension: Secondary | ICD-10-CM

## 2020-03-27 DIAGNOSIS — E1169 Type 2 diabetes mellitus with other specified complication: Secondary | ICD-10-CM | POA: Diagnosis not present

## 2020-03-27 DIAGNOSIS — I700 Atherosclerosis of aorta: Secondary | ICD-10-CM | POA: Diagnosis not present

## 2020-03-27 DIAGNOSIS — E039 Hypothyroidism, unspecified: Secondary | ICD-10-CM | POA: Diagnosis not present

## 2020-03-27 DIAGNOSIS — N182 Chronic kidney disease, stage 2 (mild): Secondary | ICD-10-CM | POA: Diagnosis not present

## 2020-03-27 DIAGNOSIS — Z79899 Other long term (current) drug therapy: Secondary | ICD-10-CM

## 2020-03-27 DIAGNOSIS — E785 Hyperlipidemia, unspecified: Secondary | ICD-10-CM | POA: Diagnosis not present

## 2020-03-27 DIAGNOSIS — E1122 Type 2 diabetes mellitus with diabetic chronic kidney disease: Secondary | ICD-10-CM | POA: Diagnosis not present

## 2020-03-27 DIAGNOSIS — E538 Deficiency of other specified B group vitamins: Secondary | ICD-10-CM | POA: Diagnosis not present

## 2020-03-27 DIAGNOSIS — L089 Local infection of the skin and subcutaneous tissue, unspecified: Secondary | ICD-10-CM | POA: Insufficient documentation

## 2020-03-27 MED ORDER — DOXYCYCLINE HYCLATE 100 MG PO CAPS: ORAL_CAPSULE | ORAL | 1 refills | Status: DC

## 2020-03-27 NOTE — Patient Instructions (Signed)
Can soak foot in warm water with salt/epsom salt for 15 min a few times a day - may help drain  Start doxycycline, refill if not fully resolved  Follow up sooner if not getting better, getting any worse      Paronychia Paronychia is an infection of the skin. It happens near a fingernail or toenail. It may cause pain and swelling around the nail. In some cases, a fluid-filled bump (abscess) can form near or under the nail. Usually, this condition is not serious, and it clears up with treatment. Follow these instructions at home: Wound care  Keep the affected area clean.  Soak the fingers or toes in warm water as told by your doctor. You may be told to do this for 20 minutes, 2-3 times a day.  Keep the area dry when you are not soaking it.  Do not try to drain a fluid-filled bump on your own.  Follow instructions from your doctor about how to take care of the affected area. Make sure you: ? Wash your hands with soap and water before you change your bandage (dressing). If you cannot use soap and water, use hand sanitizer. ? Change your bandage as told by your doctor.  If you had a fluid-filled bump and your doctor drained it, check the area every day for signs of infection. Check for: ? Redness, swelling, or pain. ? Fluid or blood. ? Warmth. ? Pus or a bad smell. Medicines   Take over-the-counter and prescription medicines only as told by your doctor.  If you were prescribed an antibiotic medicine, take it as told by your doctor. Do not stop taking it even if you start to feel better. General instructions  Avoid touching any chemicals.  Do not pick at the affected area. Prevention  To prevent this condition from happening again: ? Wear rubber gloves when putting your hands in water for washing dishes or other tasks. ? Wear gloves if your hands might touch cleaners or chemicals. ? Avoid injuring your nails or fingertips. ? Do not bite your nails or tear hangnails. ? Do  not cut your nails very short. ? Do not cut the skin at the base and sides of the nail (cuticles). ? Use clean nail clippers or scissors when trimming nails. Contact a doctor if:  You feel worse.  You do not get better.  You have more fluid, blood, or pus coming from the affected area.  Your finger or knuckle is swollen or is hard to move. Get help right away if you have:  A fever or chills.  Redness spreading from the affected area.  Pain in a joint or muscle. Summary  Paronychia is an infection of the skin. It happens near a fingernail or toenail.  This condition may cause pain and swelling around the nail.  Soak the fingers or toes in warm water as told by your doctor.  Usually, this condition is not serious, and it clears up with treatment. This information is not intended to replace advice given to you by your health care provider. Make sure you discuss any questions you have with your health care provider. Document Revised: 07/07/2017 Document Reviewed: 07/03/2017 Elsevier Patient Education  [place].     Doxycycline tablets or capsules What is this medicine? DOXYCYCLINE (dox i SYE kleen) is a tetracycline antibiotic. It kills certain bacteria or stops their growth. It is used to treat many kinds of infections, like dental, skin, respiratory, and urinary tract infections.  It also treats acne, Lyme disease, malaria, and certain sexually transmitted infections. This medicine may be used for other purposes; ask your health care provider or pharmacist if you have questions. COMMON BRAND NAME(S): Acticlate, Adoxa, Adoxa CK, Adoxa Pak, Adoxa TT, Alodox, Avidoxy, Doxal, LYMEPAK, Mondoxyne NL, Monodox, Morgidox 1x, Morgidox 1x Kit, Morgidox 2x, Morgidox 2x Kit, NutriDox, Ocudox, Beaver, Sylvester, Vibra-Tabs, Vibramycin What should I tell my health care provider before I take this medicine? They need to know if you have any of these conditions:  liver disease  long  exposure to sunlight like working outdoors  stomach problems like colitis  an unusual or allergic reaction to doxycycline, tetracycline antibiotics, other medicines, foods, dyes, or preservatives  pregnant or trying to get pregnant  breast-feeding How should I use this medicine? Take this medicine by mouth with a full glass of water. Follow the directions on the prescription label. It is best to take this medicine without food, but if it upsets your stomach take it with food. Take your medicine at regular intervals. Do not take your medicine more often than directed. Take all of your medicine as directed even if you think you are better. Do not skip doses or stop your medicine early. Talk to your pediatrician regarding the use of this medicine in children. While this drug may be prescribed for selected conditions, precautions do apply. Overdosage: If you think you have taken too much of this medicine contact a poison control center or emergency room at once. NOTE: This medicine is only for you. Do not share this medicine with others. What if I miss a dose? If you miss a dose, take it as soon as you can. If it is almost time for your next dose, take only that dose. Do not take double or extra doses. What may interact with this medicine?  antacids  barbiturates  birth control pills  bismuth subsalicylate  carbamazepine  methoxyflurane  other antibiotics  phenytoin  vitamins that contain iron  warfarin This list may not describe all possible interactions. Give your health care provider a list of all the medicines, herbs, non-prescription drugs, or dietary supplements you use. Also tell them if you smoke, drink alcohol, or use illegal drugs. Some items may interact with your medicine. What should I watch for while using this medicine? Tell your doctor or health care professional if your symptoms do not improve. Do not treat diarrhea with over the counter products. Contact your  doctor if you have diarrhea that lasts more than 2 days or if it is severe and watery. Do not take this medicine just before going to bed. It may not dissolve properly when you lay down and can cause pain in your throat. Drink plenty of fluids while taking this medicine to also help reduce irritation in your throat. This medicine can make you more sensitive to the sun. Keep out of the sun. If you cannot avoid being in the sun, wear protective clothing and use sunscreen. Do not use sun lamps or tanning beds/booths. Birth control pills may not work properly while you are taking this medicine. Talk to your doctor about using an extra method of birth control. If you are being treated for a sexually transmitted infection, avoid sexual contact until you have finished your treatment. Your sexual partner may also need treatment. Avoid antacids, aluminum, calcium, magnesium, and iron products for 4 hours before and 2 hours after taking a dose of this medicine. If you are using this medicine to  prevent malaria, you should still protect yourself from contact with mosquitos. Stay in screened-in areas, use mosquito nets, keep your body covered, and use an insect repellent. What side effects may I notice from receiving this medicine? Side effects that you should report to your doctor or health care professional as soon as possible:  allergic reactions like skin rash, itching or hives, swelling of the face, lips, or tongue  difficulty breathing  fever  itching in the rectal or genital area  pain on swallowing  rash, fever, and swollen lymph nodes  redness, blistering, peeling or loosening of the skin, including inside the mouth  severe stomach pain or cramps  unusual bleeding or bruising  unusually weak or tired  yellowing of the eyes or skin Side effects that usually do not require medical attention (report to your doctor or health care professional if they continue or are  bothersome):  diarrhea  loss of appetite  nausea, vomiting This list may not describe all possible side effects. Call your doctor for medical advice about side effects. You may report side effects to FDA at 1-800-FDA-1088. Where should I keep my medicine? Keep out of the reach of children. Store at room temperature, below 30 degrees C (86 degrees F). Protect from light. Keep container tightly closed. Throw away any unused medicine after the expiration date. Taking this medicine after the expiration date can make you seriously ill. NOTE: This sheet is a summary. It may not cover all possible information. If you have questions about this medicine, talk to your doctor, pharmacist, or health care provider.  2020 Elsevier/Gold Standard (2018-09-20 13:44:53)

## 2020-03-28 LAB — CBC WITH DIFFERENTIAL/PLATELET
Absolute Monocytes: 482 cells/uL (ref 200–950)
Basophils Absolute: 32 cells/uL (ref 0–200)
Basophils Relative: 0.6 %
Eosinophils Absolute: 265 cells/uL (ref 15–500)
Eosinophils Relative: 5 %
HCT: 40.4 % (ref 38.5–50.0)
Hemoglobin: 13.5 g/dL (ref 13.2–17.1)
Lymphs Abs: 1961 cells/uL (ref 850–3900)
MCH: 34.3 pg — ABNORMAL HIGH (ref 27.0–33.0)
MCHC: 33.4 g/dL (ref 32.0–36.0)
MCV: 102.5 fL — ABNORMAL HIGH (ref 80.0–100.0)
MPV: 10.1 fL (ref 7.5–12.5)
Monocytes Relative: 9.1 %
Neutro Abs: 2560 cells/uL (ref 1500–7800)
Neutrophils Relative %: 48.3 %
Platelets: 200 10*3/uL (ref 140–400)
RBC: 3.94 10*6/uL — ABNORMAL LOW (ref 4.20–5.80)
RDW: 11.8 % (ref 11.0–15.0)
Total Lymphocyte: 37 %
WBC: 5.3 10*3/uL (ref 3.8–10.8)

## 2020-03-28 LAB — MAGNESIUM: Magnesium: 1.9 mg/dL (ref 1.5–2.5)

## 2020-03-28 LAB — COMPLETE METABOLIC PANEL WITH GFR
AG Ratio: 1.9 (calc) (ref 1.0–2.5)
ALT: 15 U/L (ref 9–46)
AST: 16 U/L (ref 10–35)
Albumin: 4.8 g/dL (ref 3.6–5.1)
Alkaline phosphatase (APISO): 50 U/L (ref 35–144)
BUN: 13 mg/dL (ref 7–25)
CO2: 31 mmol/L (ref 20–32)
Calcium: 9.9 mg/dL (ref 8.6–10.3)
Chloride: 102 mmol/L (ref 98–110)
Creat: 0.84 mg/dL (ref 0.70–1.18)
GFR, Est African American: 99 mL/min/{1.73_m2} (ref >=60)
GFR, Est Non African American: 85 mL/min/{1.73_m2} (ref >=60)
Globulin: 2.5 g/dL (calc) (ref 1.9–3.7)
Glucose, Bld: 107 mg/dL — ABNORMAL HIGH (ref 65–99)
Potassium: 4.8 mmol/L (ref 3.5–5.3)
Sodium: 140 mmol/L (ref 135–146)
Total Bilirubin: 0.6 mg/dL (ref 0.2–1.2)
Total Protein: 7.3 g/dL (ref 6.1–8.1)

## 2020-03-28 LAB — LIPID PANEL
Cholesterol: 158 mg/dL (ref <200)
HDL: 50 mg/dL (ref >=40)
LDL Cholesterol (Calc): 87 mg/dL (calc)
Non-HDL Cholesterol (Calc): 108 mg/dL (calc) (ref <130)
Total CHOL/HDL Ratio: 3.2 (calc) (ref <5.0)
Triglycerides: 112 mg/dL (ref <150)

## 2020-03-28 LAB — HEMOGLOBIN A1C
Hgb A1c MFr Bld: 6 % of total Hgb — ABNORMAL HIGH (ref <5.7)
Mean Plasma Glucose: 126 (calc)
eAG (mmol/L): 7 (calc)

## 2020-03-28 LAB — TSH: TSH: 1.95 mIU/L (ref 0.40–4.50)

## 2020-04-03 ENCOUNTER — Ambulatory Visit (INDEPENDENT_AMBULATORY_CARE_PROVIDER_SITE_OTHER): Payer: Medicare Other | Admitting: Cardiovascular Disease

## 2020-04-03 ENCOUNTER — Encounter: Payer: Self-pay | Admitting: Cardiovascular Disease

## 2020-04-03 ENCOUNTER — Other Ambulatory Visit: Payer: Self-pay

## 2020-04-03 VITALS — BP 154/68 | HR 61 | Ht 70.0 in | Wt 172.4 lb

## 2020-04-03 DIAGNOSIS — E1122 Type 2 diabetes mellitus with diabetic chronic kidney disease: Secondary | ICD-10-CM

## 2020-04-03 DIAGNOSIS — I10 Essential (primary) hypertension: Secondary | ICD-10-CM | POA: Diagnosis not present

## 2020-04-03 DIAGNOSIS — I700 Atherosclerosis of aorta: Secondary | ICD-10-CM | POA: Diagnosis not present

## 2020-04-03 DIAGNOSIS — E7800 Pure hypercholesterolemia, unspecified: Secondary | ICD-10-CM | POA: Diagnosis not present

## 2020-04-03 DIAGNOSIS — N182 Chronic kidney disease, stage 2 (mild): Secondary | ICD-10-CM | POA: Diagnosis not present

## 2020-04-03 DIAGNOSIS — I25810 Atherosclerosis of coronary artery bypass graft(s) without angina pectoris: Secondary | ICD-10-CM

## 2020-04-03 NOTE — Progress Notes (Signed)
Cardiology office note    Date:  04/03/2020   ID:  Joshua Higgins, DOB February 09, 1946, MRN [id number]  PCP:  Joshua Pinto, MD  Cardiologist:  Joshua Higgins Electrophysiologist:  None   Evaluation Performed:  Follow-Up Visit  Chief Complaint:  CAD f/u  History of Present Illness:    Joshua Higgins is a 74 y.o. male who is now roughly 10 years after coronary bypass surgery (LIMA to LAD, SVG to ramus intermedius and OM, SVG to acute marginal branch of RCA), and also has a history of hyperlipidemia, hypertension, type 2 diabetes mellitus controlled with oral antidiabetics, erectile dysfunction, migraines.  He is intolerant to statins as a daily medication (multiple agents tried, tolerates twice weekly rosuvastatin) and had an exceptionally good response to twice weekly rosuvastatin plus ezetimibe.  He was diagnosed based on an abnormal screening stress test and has never had convincing angina pectoris.  His most recent stress test was in 2019.  He had normal functional capacity and no angina during stress testing in April 2019.  He exercised for about 6 minutes, 1 minute less compared with 2017.  No high risk ECG changes occurred.  Still goes for walks in the woods with his dog and is able to use a chainsaw and move blocks without having chest discomfort or dyspnea.  He has right-sided sciatica radiating from his back all the way to his great toe.  He has had 2 previous back surgeries with Dr. Ellene Higgins.  The patient specifically denies any chest pain at rest exertion, dyspnea at rest or with exertion, orthopnea, paroxysmal nocturnal dyspnea, syncope, palpitations, focal neurological deficits, intermittent claudication, lower extremity edema, unexplained weight gain, cough, hemoptysis or wheezing.  He is no longer taking ezetimibe.  He was worried this would cause dizziness.  His blood pressure was high when he first checked him today at 154/68, but has been 120s/70s at home and was coming down  during today's office visit.  His blood pressure was 128/68 and his primary care provider's office just 1 week ago.   Past Medical History:  Diagnosis Date  . Anemia    low iron  . Arthritis   . Cancer ([place])    skin cancer on face  . Cataracts, bilateral   . Coronary artery disease    CABG - 2008  . Diabetes mellitus without complication ([place])    type II  . GERD (gastroesophageal reflux disease)   . Hepatitis   . Hyperlipidemia   . Hypertension    pt states he does not have HTN, takes Atenolol for migraines  . Hypothyroidism   . Migraine headache with aura    takes Atenolol  . Sleep apnea    does not use cpap   Past Surgical History:  Procedure Laterality Date  . BACK SURGERY    . CARDIAC SURGERY     Bypass  . CATARACT EXTRACTION, BILATERAL Bilateral 2018   Dr. Gershon Higgins  . COLONOSCOPY    . CORONARY ANGIOPLASTY  2001  . CORONARY ARTERY BYPASS GRAFT    . ELBOW SURGERY Left    due to damaged nerve  . KNEE SURGERY Right    arthroscopic  . LUMBAR LAMINECTOMY/DECOMPRESSION MICRODISCECTOMY Right 08/04/2014   Procedure: Right Lumbar four-five Microdiskectomy;  Surgeon: Joshua Miss, MD;  Location: [facility];  Service: Neurosurgery;  Laterality: Right;  Right L4-5 Microdiskectomy  . SURGERY SCROTAL / TESTICULAR     as a child/teenager     Current Meds  Medication Sig  .  acyclovir (ZOVIRAX) 800 MG tablet Take 1 tablet daily for Fever Blister / Mouth Ulcers (Patient taking differently: Take 800 mg by mouth as needed. For Fever Blister / Mouth Ulcers)  . aspirin EC 81 MG tablet Take 1 tablet (81 mg total) by mouth daily.  Marland Kitchen atenolol (TENORMIN) 50 MG tablet Take 1 tablet Daily for BP & Heart  . Lexicographer BLOOD SUGAR ONCE A DAY  . Blood Glucose Monitoring Suppl DEVI Use kit to check blood sugar once a day  . Cholecalciferol 5000 UNITS TABS Take 1 tablet by mouth daily.  . CONTOUR NEXT TEST test strip USE TO TEST BLOOD SUGAR ONCE DAILY  .  Cyanocobalamin (B-12) 1000 MCG SUBL Place 1 tablet under the tongue daily.  Marland Kitchen doxycycline (VIBRAMYCIN) 100 MG capsule Take 1 capsule twice daily with food  . levothyroxine (SYNTHROID) 50 MCG tablet Take 1 tablet daily on an empty stomach with only water for 30 minutes & no Antacid meds, Calcium or Magnesium for 4 hours & avoid Biotin  . losartan (COZAAR) 25 MG tablet TAKE 1/2 TABLET BY MOUTH AT BEDTIME  . metFORMIN (GLUCOPHAGE-XR) 500 MG 24 hr tablet Take 2 tablets 2 x /day with Meals for Diabetes  . Multiple Vitamin (MULTIVITAMIN) tablet Take 1 tablet by mouth at bedtime.   . Omega-3 Fatty Acids (FISH OIL) 1000 MG CAPS Take 1,000 mg by mouth at bedtime.   . pantoprazole (PROTONIX) 40 MG tablet Take 1 tablet by mouth daily.  . rosuvastatin (CRESTOR) 10 MG tablet TAKE 1 TABLET 2 X /WEEK FOR CHOLESTEROL     Allergies:   Gemfibrozil, Ace inhibitors, and Lipitor [atorvastatin]   Social History   Tobacco Use  . Smoking status: Former Smoker    Quit date: 10/01/1973    Years since quitting: 46.5  . Smokeless tobacco: Never Used  Substance Use Topics  . Alcohol use: No  . Drug use: No     Family Hx: The patient's family history includes Arthritis in his mother; CAD in his brother and sister; Diabetes in his maternal grandmother; Heart attack in his brother and father; Hypertension in his brother; Ulcers in his mother.  ROS:   Please see the history of present illness.   All other systems are reviewed and are negative.   Prior CV studies:   The following studies were reviewed today:  Notes and labs from Joshua Higgins  Labs/Other Tests and Data Reviewed:    EKG: Not ordered today.  EKG from 12/24/2019 shows sinus rhythm with left axis deviation and no evidence of any repolarization abnormalities.  Recent Labs: 03/27/2020: ALT 15; BUN 13; Creat 0.84; Hemoglobin 13.5; Magnesium 1.9; Platelets 200; Potassium 4.8; Sodium 140; TSH 1.95   Lipid Panel     Component Value Date/Time   CHOL 158  03/27/2020 1106   TRIG 112 03/27/2020 1106   HDL 50 03/27/2020 1106   CHOLHDL 3.2 03/27/2020 1106   VLDL 25 02/14/2017 1214   LDLCALC 87 03/27/2020 1106     Recent Lipid Panel Lab Results  Component Value Date/Time   CHOL 158 03/27/2020 11:06 AM   TRIG 112 03/27/2020 11:06 AM   HDL 50 03/27/2020 11:06 AM   CHOLHDL 3.2 03/27/2020 11:06 AM   LDLCALC 87 03/27/2020 11:06 AM    Wt Readings from Last 3 Encounters:  04/03/20 172 lb 6.4 oz (78.2 kg)  03/27/20 171 lb (77.6 kg)  12/24/19 165 lb 3.2 oz (74.9 kg)     Objective:  Vital Signs:  BP (!) 154/68   Pulse 61   Ht _0  (1.778 m)   Wt 172 lb 6.4 oz (78.2 kg)   SpO2 99%   BMI 24.74 kg/m     General: Alert, oriented x3, no distress, appears fit Head: no evidence of trauma, PERRL, EOMI, no exophtalmos or lid lag, no myxedema, no xanthelasma; normal ears, nose and oropharynx Neck: normal jugular venous pulsations and no hepatojugular reflux; brisk carotid pulses without delay and no carotid bruits Chest: clear to auscultation, no signs of consolidation by percussion or palpation, normal fremitus, symmetrical and full respiratory excursions Cardiovascular: normal position and quality of the apical impulse, regular rhythm, normal first and second heart sounds, no murmurs, rubs or gallops Abdomen: no tenderness or distention, no masses by palpation, no abnormal pulsatility or arterial bruits, normal bowel sounds, no hepatosplenomegaly Extremities: no clubbing, cyanosis or edema; 2+ radial, ulnar and brachial pulses bilaterally; 2+ right femoral, posterior tibial and dorsalis pedis pulses; 2+ left femoral, posterior tibial and dorsalis pedis pulses; no subclavian or femoral bruits Neurological: grossly nonfocal Psych: Normal mood and affect    ASSESSMENT & PLAN:    1. Coronary artery disease involving coronary bypass graft of native heart without angina pectoris   2. Essential hypertension   3. Type 2 diabetes mellitus with  stage 2 chronic kidney disease, without long-term current use of insulin ([place])   4. Hypercholesterolemia   5. Atherosclerosis of abdominal aorta ([place])      1. CAD s/p CABG:  Remains asymptomatic and quite active.  He never had angina pectoris and will plan a treadmill stress test, last one performed April 2021.  On aspirin and maximum tolerated dose of statin 2. HTN:  Well controlled. 3. DM:  Improved control, most recent hemoglobin A1c only 6%, on Metformin monotherapy.  Healthy weight.. 4. HLP: excellent response on just twice weekly rosuvastatin plus ezetimibe (LDL was in the 50s), but LDL has gone up after he stopped the ezetimibe.  Target LDL of less than 70.  I have recommended that he restart this medication. 5. Atherosclerosis of the abdominal aorta: Without aneurysm, documented on imaging studies.  Patient Instructions  Medication Instructions:  No changes *If you need a refill on your cardiac medications before your next appointment, please call your pharmacy*   Lab Work: None ordered If you have labs (blood work) drawn today and your tests are completely normal, you will receive your results only by: Marland Kitchen MyChart Message (if you have MyChart) OR . A paper copy in the mail If you have any lab test that is abnormal or we need to change your treatment, we will call you to review the results.   Testing/Procedures: Your physician has requested that you have an exercise tolerance test. For further information please visit [email or web address]. Please also follow instruction sheet, as given. This will take place at [place], [address].  Do not drink or eat foods with caffeine for 24 hours before the test. (Chocolate, coffee, tea, or energy drinks)  If you use an inhaler, bring it with you to the test.  Do not smoke for 4 hours before the test.  Wear comfortable shoes and clothing.  Hold the Atenolol the morning of the test  You will need the COVID test prior  You  will need to wear a mask during the test    Follow-Up: At [facility], you and your health needs are our priority.  As part of our continuing mission  to provide you with exceptional heart care, we have created designated Provider Care Teams.  These Care Teams include your primary Cardiologist (physician) and Advanced Practice Providers (APPs -  Physician Assistants and Nurse Practitioners) who all work together to provide you with the care you need, when you need it.  We recommend signing up for the patient portal called "MyChart".  Sign up information is provided on this After Visit Summary.  MyChart is used to connect with patients for Virtual Visits (Telemedicine).  Patients are able to view lab/test results, encounter notes, upcoming appointments, etc.  Non-urgent messages can be sent to your provider as well.   To learn more about what you can do with MyChart, go to [email or web address].    Your next appointment:   12 month(s)  The format for your next appointment:   In Person  Provider:   You may see Sanda Klein, MD or one of the following Advanced Practice Providers on your designated Care Team:    Almyra Deforest, PA-C  Fabian Sharp, [place] or   Roby Lofts, PA-C     Signed, Sanda Klein, MD  04/03/2020 8:51 AM    [place]

## 2020-04-03 NOTE — Patient Instructions (Addendum)
Medication Instructions:  No changes *If you need a refill on your cardiac medications before your next appointment, please call your pharmacy*   Lab Work: None ordered If you have labs (blood work) drawn today and your tests are completely normal, you will receive your results only by: Marland Kitchen MyChart Message (if you have MyChart) OR . A paper copy in the mail If you have any lab test that is abnormal or we need to change your treatment, we will call you to review the results.   Testing/Procedures: Your physician has requested that you have an exercise tolerance test. For further information please visit [email or web address]. Please also follow instruction sheet, as given. This will take place at [place], [address].  Do not drink or eat foods with caffeine for 24 hours before the test. (Chocolate, coffee, tea, or energy drinks)  If you use an inhaler, bring it with you to the test.  Do not smoke for 4 hours before the test.  Wear comfortable shoes and clothing.  Hold the Atenolol the morning of the test  You will need the COVID test prior  You will need to wear a mask during the test    Follow-Up: At [facility], you and your health needs are our priority.  As part of our continuing mission to provide you with exceptional heart care, we have created designated Provider Care Teams.  These Care Teams include your primary Cardiologist (physician) and Advanced Practice Providers (APPs -  Physician Assistants and Nurse Practitioners) who all work together to provide you with the care you need, when you need it.  We recommend signing up for the patient portal called "MyChart".  Sign up information is provided on this After Visit Summary.  MyChart is used to connect with patients for Virtual Visits (Telemedicine).  Patients are able to view lab/test results, encounter notes, upcoming appointments, etc.  Non-urgent messages can be sent to your provider as well.   To learn more  about what you can do with MyChart, go to [email or web address].    Your next appointment:   12 month(s)  The format for your next appointment:   In Person  Provider:   You may see Sanda Klein, MD or one of the following Advanced Practice Providers on your designated Care Team:    Almyra Deforest, PA-C  Fabian Sharp, PA-C or   Roby Lofts, [place]

## 2020-04-10 ENCOUNTER — Telehealth (HOSPITAL_COMMUNITY): Payer: Self-pay | Admitting: *Deleted

## 2020-04-10 ENCOUNTER — Other Ambulatory Visit (HOSPITAL_COMMUNITY)
Admission: RE | Admit: 2020-04-10 | Discharge: 2020-04-10 | Disposition: A | Discharge status: Home / Self Care | Payer: Medicare Other | Source: Ambulatory Visit | Attending: Cardiovascular Disease | Admitting: Cardiovascular Disease

## 2020-04-10 DIAGNOSIS — Z01818 Encounter for other preprocedural examination: Secondary | ICD-10-CM | POA: Diagnosis not present

## 2020-04-10 DIAGNOSIS — Z20822 Contact with and (suspected) exposure to covid-19: Secondary | ICD-10-CM | POA: Diagnosis not present

## 2020-04-10 LAB — SARS CORONAVIRUS 2 (TAT 6-24 HRS): SARS Coronavirus 2: NEGATIVE

## 2020-04-10 NOTE — Telephone Encounter (Signed)
Close encounter 

## 2020-04-14 ENCOUNTER — Ambulatory Visit (HOSPITAL_COMMUNITY)
Admission: RE | Admit: 2020-04-14 | Discharge: 2020-04-14 | Disposition: A | Discharge status: Home / Self Care | Payer: Medicare Other | Source: Ambulatory Visit | Attending: Cardiovascular Disease | Admitting: Cardiovascular Disease

## 2020-04-14 ENCOUNTER — Other Ambulatory Visit: Payer: Self-pay

## 2020-04-14 DIAGNOSIS — I25810 Atherosclerosis of coronary artery bypass graft(s) without angina pectoris: Secondary | ICD-10-CM | POA: Insufficient documentation

## 2020-04-14 LAB — EXERCISE TOLERANCE TEST
Estimated workload: 7 METS
Exercise duration (min): 5 min
Exercise duration (sec): 30 s
MPHR: 144 {beats}/min
Peak HR: 136 {beats}/min
Percent HR: 94 %
Rest HR: 71 {beats}/min

## 2020-04-20 ENCOUNTER — Telehealth: Payer: Self-pay | Admitting: Cardiovascular Disease

## 2020-04-20 NOTE — Telephone Encounter (Signed)
Left the pt a message to call the office back to endorse stress test results per Dr. Sallyanne Kuster.

## 2020-04-20 NOTE — Telephone Encounter (Signed)
Patient's wife states she is returning a call regarding patient's recent stress test. Please call.

## 2020-04-21 ENCOUNTER — Telehealth: Payer: Self-pay | Admitting: Nurse Practitioner

## 2020-04-21 NOTE — Telephone Encounter (Signed)
Results were sent to patient through his MyChart account

## 2020-04-21 NOTE — Telephone Encounter (Signed)
Received call directly from operator from patient who states he would like to receive the results of his stress test. Reviewed results of normal stress test with patient and he thanked me for the call.

## 2020-04-27 DIAGNOSIS — Z23 Encounter for immunization: Secondary | ICD-10-CM | POA: Diagnosis not present

## 2020-04-29 ENCOUNTER — Other Ambulatory Visit: Payer: Self-pay | Admitting: Adult Health

## 2020-04-30 ENCOUNTER — Telehealth: Payer: Self-pay

## 2020-04-30 MED ORDER — FAMOTIDINE 20 MG PO TABS: 20.0000 mg | ORAL_TABLET | Freq: Two times a day (BID) | ORAL | 3 refills | Status: DC

## 2020-04-30 NOTE — Telephone Encounter (Signed)
Refill request on Famotidine. Patient states that the pharmacy told him that we denied the request.

## 2020-05-07 DIAGNOSIS — Z961 Presence of intraocular lens: Secondary | ICD-10-CM | POA: Diagnosis not present

## 2020-05-07 DIAGNOSIS — E119 Type 2 diabetes mellitus without complications: Secondary | ICD-10-CM | POA: Diagnosis not present

## 2020-05-07 LAB — HM DIABETES EYE EXAM

## 2020-05-12 ENCOUNTER — Encounter: Payer: Self-pay | Admitting: *Deleted

## 2020-05-16 ENCOUNTER — Other Ambulatory Visit: Payer: Self-pay | Admitting: Internal Medicine

## 2020-06-28 DIAGNOSIS — Z20822 Contact with and (suspected) exposure to covid-19: Secondary | ICD-10-CM | POA: Diagnosis not present

## 2020-06-30 ENCOUNTER — Ambulatory Visit: Payer: Medicare Other | Admitting: Internal Medicine

## 2020-07-08 ENCOUNTER — Other Ambulatory Visit: Payer: Self-pay

## 2020-07-08 DIAGNOSIS — N182 Chronic kidney disease, stage 2 (mild): Secondary | ICD-10-CM

## 2020-07-08 DIAGNOSIS — E1122 Type 2 diabetes mellitus with diabetic chronic kidney disease: Secondary | ICD-10-CM

## 2020-07-08 MED ORDER — LANCETS MISC: 3 refills | Status: DC

## 2020-07-20 ENCOUNTER — Ambulatory Visit: Payer: Medicare Other | Admitting: Internal Medicine

## 2020-07-22 ENCOUNTER — Other Ambulatory Visit: Payer: Self-pay

## 2020-07-22 ENCOUNTER — Encounter: Payer: Self-pay | Admitting: Internal Medicine

## 2020-07-22 ENCOUNTER — Ambulatory Visit (INDEPENDENT_AMBULATORY_CARE_PROVIDER_SITE_OTHER): Payer: Medicare Other | Admitting: Internal Medicine

## 2020-07-22 VITALS — BP 135/80 | HR 70 | Temp 97.1°F | Resp 16 | Ht 69.0 in | Wt 173.4 lb

## 2020-07-22 DIAGNOSIS — E1122 Type 2 diabetes mellitus with diabetic chronic kidney disease: Secondary | ICD-10-CM

## 2020-07-22 DIAGNOSIS — N182 Chronic kidney disease, stage 2 (mild): Secondary | ICD-10-CM | POA: Diagnosis not present

## 2020-07-22 DIAGNOSIS — E785 Hyperlipidemia, unspecified: Secondary | ICD-10-CM

## 2020-07-22 DIAGNOSIS — E039 Hypothyroidism, unspecified: Secondary | ICD-10-CM

## 2020-07-22 DIAGNOSIS — I10 Essential (primary) hypertension: Secondary | ICD-10-CM | POA: Diagnosis not present

## 2020-07-22 DIAGNOSIS — E559 Vitamin D deficiency, unspecified: Secondary | ICD-10-CM

## 2020-07-22 DIAGNOSIS — E1169 Type 2 diabetes mellitus with other specified complication: Secondary | ICD-10-CM

## 2020-07-22 DIAGNOSIS — I25810 Atherosclerosis of coronary artery bypass graft(s) without angina pectoris: Secondary | ICD-10-CM

## 2020-07-22 DIAGNOSIS — Z79899 Other long term (current) drug therapy: Secondary | ICD-10-CM

## 2020-07-22 NOTE — Progress Notes (Signed)
History of Present Illness:       This very nice 75 y.o.  MWM presents for 6 month follow up with HTN, ASCAD /CABG, [name], [name] w/CKD2  and Vitamin D Deficiency.  In Aug 2020, patient was noted by Chest CTA scan to have Aortic Atherosclerosis.       Patient is treated for HTN & BP has been controlled at home. Today's BP was slightly elevated, but rechecked at goal - 135/80. Patient has had no complaints of any cardiac type chest pain, palpitations, dyspnea / orthopnea / PND, dizziness, claudication, or dependent edema.      Hyperlipidemia is controlled with diet & meds. Patient denies myalgias or other med SE's. Last Lipids were at goal:  Lab Results  Component Value Date   CHOL 158 03/27/2020   HDL 50 03/27/2020   LDLCALC 87 03/27/2020   TRIG 112 03/27/2020   CHOLHDL 3.2 03/27/2020    Also, the patient has history of  and has had no symptoms of reactive hypoglycemia, diabetic polys, paresthesias or visual blurring.  Last A1c was not at goal:  Lab Results  Component Value Date   HGBA1C 6.0 (H) 03/27/2020       Further, the patient also has history of Vitamin D Deficiency and supplements vitamin D without any suspected side-effects. Last vitamin D was at goal:  Lab Results  Component Value Date   VD25OH 68 12/24/2019    Current Outpatient Medications on File Prior to Visit  Medication Sig  . acyclovir 800 MG tablet Take 1 tablet daily   . aspirin EC 81 MG tablet Take 1 tablet  daily.  Marland Kitchen atenolol  50 MG tablet Take 1 tablet Daily  . Cholecalciferol 5000 u Take 1 tablet  daily.  . Vit B-12  1000 MCG SL Place 1 tablet under the tongue daily.  . famotidine  20 MG tablet Take 1 tablet  2  times daily.  Marland Kitchen levothyroxine  50 MCG tablet Take 1 tablet daily   . losartan  25 MG tablet TAKE 1/2 TABLET  AT BEDTIME  . metFORMIN-XR  500 MG  Take       2 tablets      2 x /day    . Multiple Vitamin  Take 1 tablet  at bedtime.   . Omega-3 FISH OIL 1000 MG  Take 1,000 mg  at  bedtime.   . pantoprazole 40 MG tablet Take 1 tablet  daily.  . rosuvastatin  10 MG tablet TAKE 1 TABLET 2 X /WEEK      Allergies  Allergen Reactions  . Gemfibrozil Other (See Comments)    Muscle aches  . Ace Inhibitors Cough  . Lipitor [Atorvastatin] Other (See Comments)    Joint pain.    PMHx:   Past Medical History:  Diagnosis Date  . Anemia    low iron  . Arthritis   . Cancer ([place])    skin cancer on face  . Cataracts, bilateral   . Coronary artery disease    CABG - 2008  . Diabetes mellitus without complication ([place])    type II  . GERD (gastroesophageal reflux disease)   . Hepatitis   . Hyperlipidemia   . Hypertension    pt states he does not have HTN, takes Atenolol for migraines  . Hypothyroidism   . Migraine headache with aura    takes Atenolol  . Sleep apnea    does not use cpap  Immunization History  Administered Date(s) Administered  . DT (Pediatric) 08/29/2003, 06/04/2015  . Influenza Split 02/21/2019  . Influenza, High Dose Seasonal PF 04/03/2018, 02/21/2019, 03/05/2020  . Influenza-Unspecified 04/16/2014, 03/30/2016, 04/02/2017  . Moderna Sars-Covid-2 Vaccination 07/16/2019, 08/16/2019, 04/27/2020  . Pneumococcal Conjugate-13 12/26/2013  . Pneumococcal Polysaccharide-23 01/29/2010  . Zoster 05/11/2010  . Zoster Recombinat (Shingrix) 01/31/2017, 07/05/2017, 08/09/2017    Past Surgical History:  Procedure Laterality Date  . BACK SURGERY    . CARDIAC SURGERY     Bypass  . CATARACT EXTRACTION, BILATERAL Bilateral 2018   Dr. Gershon Crane  . COLONOSCOPY    . CORONARY ANGIOPLASTY  2001  . CORONARY ARTERY BYPASS GRAFT    . ELBOW SURGERY Left    due to damaged nerve  . KNEE SURGERY Right    arthroscopic  . LUMBAR LAMINECTOMY/DECOMPRESSION MICRODISCECTOMY Right 08/04/2014   Procedure: Right Lumbar four-five Microdiskectomy;  Surgeon: Kristeen Miss, MD;  Location: [place];  Service: Neurosurgery;  Laterality: Right;  Right L4-5 Microdiskectomy  .  SURGERY SCROTAL / TESTICULAR     as a child/teenager    FHx:    Reviewed / unchanged  SHx:    Reviewed / unchanged   Systems Review:  Constitutional: Denies fever, chills, wt changes, headaches, insomnia, fatigue, night sweats, change in appetite. Eyes: Denies redness, blurred vision, diplopia, discharge, itchy, watery eyes.  ENT: Denies discharge, congestion, post nasal drip, epistaxis, sore throat, earache, hearing loss, dental pain, tinnitus, vertigo, sinus pain, snoring.  CV: Denies chest pain, palpitations, irregular heartbeat, syncope, dyspnea, diaphoresis, orthopnea, PND, claudication or edema. Respiratory: denies cough, dyspnea, DOE, pleurisy, hoarseness, laryngitis, wheezing.  Gastrointestinal: Denies dysphagia, odynophagia, heartburn, reflux, water brash, abdominal pain or cramps, nausea, vomiting, bloating, diarrhea, constipation, hematemesis, melena, hematochezia  or hemorrhoids. Genitourinary: Denies dysuria, frequency, urgency, nocturia, hesitancy, discharge, hematuria or flank pain. Musculoskeletal: Denies arthralgias, myalgias, stiffness, jt. swelling, pain, limping or strain/sprain.  Skin: Denies pruritus, rash, hives, warts, acne, eczema or change in skin lesion(s). Neuro: No weakness, tremor, incoordination, spasms, paresthesia or pain. Psychiatric: Denies confusion, memory loss or sensory loss. Endo: Denies change in weight, skin or hair change.  Heme/Lymph: No excessive bleeding, bruising or enlarged lymph nodes.  Physical Exam  BP 135/80   Pulse 70   Temp (!) 97.1 F (36.2 C)   Resp 16   Ht 5\' 9"  (1.753 m)   Wt 173 lb 6.4 oz (78.7 kg)   SpO2 99%   BMI 25.61 kg/m   Appears  well nourished, well groomed  and in no distress.  Eyes: PERRLA, EOMs, conjunctiva no swelling or erythema. Sinuses: No frontal/maxillary tenderness ENT/Mouth: EAC's clear, TM's nl w/o erythema, bulging. Nares clear w/o erythema, swelling, exudates. Oropharynx clear without erythema or  exudates. Oral hygiene is good. Tongue normal, non obstructing. Hearing intact.  Neck: Supple. Thyroid not palpable. Car 2+/2+ without bruits, nodes or JVD. Chest: Respirations nl with BS clear & equal w/o rales, rhonchi, wheezing or stridor.  Cor: Heart sounds normal w/ regular rate and rhythm without sig. murmurs, gallops, clicks or rubs. Peripheral pulses normal and equal  without edema.  Abdomen: Soft & bowel sounds normal. Non-tender w/o guarding, rebound, hernias, masses or organomegaly.  Lymphatics: Unremarkable.  Musculoskeletal: Full ROM all peripheral extremities, joint stability, 5/5 strength and normal gait.  Skin: Warm, dry without exposed rashes, lesions or ecchymosis apparent.  Neuro: Cranial nerves intact, reflexes equal bilaterally. Sensory-motor testing grossly intact. Tendon reflexes grossly intact.  Pysch: Alert & oriented x 3.  Insight and judgement nl & appropriate. No ideations.  Assessment and Plan:  1. Essential hypertension  - Continue medication, monitor blood pressure at home.  - Continue DASH diet.  Reminder to go to the ER if any CP,  SOB, nausea, dizziness, severe HA, changes vision/speech.  - CBC with Differential/Platelet - COMPLETE METABOLIC PANEL WITH GFR - Magnesium - TSH  2. Hyperlipidemia associated with type 2 diabetes mellitus ([place])  - Continue diet/meds, exercise,& lifestyle modifications.  - Continue monitor periodic cholesterol/liver & renal functions   - Lipid panel - TSH  3. Type 2 diabetes mellitus with stage 2 chronic kidney  disease, without long-term current use of insulin ([facility])  - Continue diet, exercise  - Lifestyle modifications.  - Monitor appropriate labs  - Hemoglobin A1c - Insulin, random  4. Coronary artery disease involving coronary bypass  graft of native heart without angina pectoris  - Lipid panel  5. Vitamin D deficiency  - Continue supplementation.  - VITAMIN D 25 Hydroxy  6. Hypothyroidism,  -  TSH  7. Medication management  - CBC with Differential/Platelet - COMPLETE METABOLIC PANEL WITH GFR - Magnesium - Lipid panel - TSH - Hemoglobin A1c - Insulin, random - VITAMIN D 25 Hydroxy         Discussed  regular exercise, BP monitoring, weight control to achieve/maintain BMI less than 25 and discussed med and SE's. Recommended labs to assess and monitor clinical status with further disposition pending results of labs.  I discussed the assessment and treatment plan with the patient. The patient was provided an opportunity to ask questions and all were answered. The patient agreed with the plan and demonstrated an understanding of the instructions.  I provided over 30 minutes of exam, counseling, chart review and  complex critical decision making.         The patient was advised to call back or seek an in-person evaluation if the symptoms worsen or if the condition fails to improve as anticipated.   Kirtland Bouchard, MD

## 2020-07-22 NOTE — Patient Instructions (Signed)

## 2020-07-23 LAB — CBC WITH DIFFERENTIAL/PLATELET
Absolute Monocytes: 557 cells/uL (ref 200–950)
Basophils Absolute: 29 cells/uL (ref 0–200)
Basophils Relative: 0.5 %
Eosinophils Absolute: 209 cells/uL (ref 15–500)
Eosinophils Relative: 3.6 %
HCT: 37.7 % — ABNORMAL LOW (ref 38.5–50.0)
Hemoglobin: 12.7 g/dL — ABNORMAL LOW (ref 13.2–17.1)
Lymphs Abs: 2639 cells/uL (ref 850–3900)
MCH: 33.8 pg — ABNORMAL HIGH (ref 27.0–33.0)
MCHC: 33.7 g/dL (ref 32.0–36.0)
MCV: 100.3 fL — ABNORMAL HIGH (ref 80.0–100.0)
MPV: 10.2 fL (ref 7.5–12.5)
Monocytes Relative: 9.6 %
Neutro Abs: 2366 cells/uL (ref 1500–7800)
Neutrophils Relative %: 40.8 %
Platelets: 179 10*3/uL (ref 140–400)
RBC: 3.76 10*6/uL — ABNORMAL LOW (ref 4.20–5.80)
RDW: 11.7 % (ref 11.0–15.0)
Total Lymphocyte: 45.5 %
WBC: 5.8 10*3/uL (ref 3.8–10.8)

## 2020-07-23 LAB — COMPLETE METABOLIC PANEL WITH GFR
AG Ratio: 2 (calc) (ref 1.0–2.5)
ALT: 17 U/L (ref 9–46)
AST: 14 U/L (ref 10–35)
Albumin: 4.6 g/dL (ref 3.6–5.1)
Alkaline phosphatase (APISO): 44 U/L (ref 35–144)
BUN: 19 mg/dL (ref 7–25)
CO2: 30 mmol/L (ref 20–32)
Calcium: 10.1 mg/dL (ref 8.6–10.3)
Chloride: 103 mmol/L (ref 98–110)
Creat: 0.92 mg/dL (ref 0.70–1.18)
GFR, Est African American: 93 mL/min/{1.73_m2} (ref >=60)
GFR, Est Non African American: 81 mL/min/{1.73_m2} (ref >=60)
Globulin: 2.3 g/dL (calc) (ref 1.9–3.7)
Glucose, Bld: 90 mg/dL (ref 65–99)
Potassium: 5.4 mmol/L — ABNORMAL HIGH (ref 3.5–5.3)
Sodium: 141 mmol/L (ref 135–146)
Total Bilirubin: 0.5 mg/dL (ref 0.2–1.2)
Total Protein: 6.9 g/dL (ref 6.1–8.1)

## 2020-07-23 LAB — TSH: TSH: 1.47 mIU/L (ref 0.40–4.50)

## 2020-07-23 LAB — LIPID PANEL
Cholesterol: 142 mg/dL (ref <200)
HDL: 47 mg/dL (ref >=40)
LDL Cholesterol (Calc): 75 mg/dL (calc)
Non-HDL Cholesterol (Calc): 95 mg/dL (calc) (ref <130)
Total CHOL/HDL Ratio: 3 (calc) (ref <5.0)
Triglycerides: 116 mg/dL (ref <150)

## 2020-07-23 LAB — HEMOGLOBIN A1C
Hgb A1c MFr Bld: 6.2 % of total Hgb — ABNORMAL HIGH (ref <5.7)
Mean Plasma Glucose: 131 mg/dL
eAG (mmol/L): 7.3 mmol/L

## 2020-07-23 LAB — INSULIN, RANDOM: Insulin: 8.3 u[IU]/mL

## 2020-07-23 LAB — MAGNESIUM: Magnesium: 1.8 mg/dL (ref 1.5–2.5)

## 2020-07-23 LAB — VITAMIN D 25 HYDROXY (VIT D DEFICIENCY, FRACTURES): Vit D, 25-Hydroxy: 56 ng/mL (ref 30–100)

## 2020-07-24 NOTE — Progress Notes (Signed)
========================================================== °========================================================== ° °-    Total Chol = 142   and LDL Chol = 75  - Both  Excellent   - Very low risk for Heart Attack  / Stroke ========================================================  - A1c higher - Up from 6.0% to now 6.2%     - Avoid Sweets, Candy & White Stuff   - Rice, Potatoes, Breads &  Pasta ========================================================== ==========================================================  - Vit D = 56 - Slightly Low   - Vitamin D goal is between 70-100.   - Please make sure that you are taking your Vitamin D as directed.   - It is very important as a natural anti-inflammatory and helping the  immune system protect against viral infections, like the Covid-19    helping hair, skin, and nails, as well as reducing stroke and  heart attack risk.   - It helps your bones and helps with mood.  - It also decreases numerous cancer risks so please  take it as directed.   - Low Vit D is associated with a 200-300% higher risk for  CANCER   and 200-300% higher risk for HEART   ATTACK  &  STROKE.    - It is also associated with higher death rate at younger ages,   autoimmune diseases like Rheumatoid arthritis, Lupus,  Multiple Sclerosis.     - Also many other serious conditions, like depression, Alzheimer's  Dementia, infertility, muscle aches, fatigue, fibromyalgia   - just to name a few. ========================================================== ==========================================================  - All Else - CBC - Kidneys - Electrolytes - Liver - Magnesium & Thyroid    - all  Normal / OK ===========================================================

## 2020-08-07 ENCOUNTER — Other Ambulatory Visit: Payer: Self-pay | Admitting: Internal Medicine

## 2020-08-14 DIAGNOSIS — D485 Neoplasm of uncertain behavior of skin: Secondary | ICD-10-CM | POA: Diagnosis not present

## 2020-08-14 DIAGNOSIS — C44629 Squamous cell carcinoma of skin of left upper limb, including shoulder: Secondary | ICD-10-CM | POA: Diagnosis not present

## 2020-08-14 DIAGNOSIS — L821 Other seborrheic keratosis: Secondary | ICD-10-CM | POA: Diagnosis not present

## 2020-08-14 DIAGNOSIS — Z85828 Personal history of other malignant neoplasm of skin: Secondary | ICD-10-CM | POA: Diagnosis not present

## 2020-08-14 DIAGNOSIS — L570 Actinic keratosis: Secondary | ICD-10-CM | POA: Diagnosis not present

## 2020-08-14 DIAGNOSIS — L905 Scar conditions and fibrosis of skin: Secondary | ICD-10-CM | POA: Diagnosis not present

## 2020-08-14 DIAGNOSIS — C44622 Squamous cell carcinoma of skin of right upper limb, including shoulder: Secondary | ICD-10-CM | POA: Diagnosis not present

## 2020-08-14 DIAGNOSIS — C44229 Squamous cell carcinoma of skin of left ear and external auricular canal: Secondary | ICD-10-CM | POA: Diagnosis not present

## 2020-08-16 ENCOUNTER — Other Ambulatory Visit: Payer: Self-pay | Admitting: Internal Medicine

## 2020-09-01 DIAGNOSIS — D0462 Carcinoma in situ of skin of left upper limb, including shoulder: Secondary | ICD-10-CM | POA: Diagnosis not present

## 2020-09-01 DIAGNOSIS — D485 Neoplasm of uncertain behavior of skin: Secondary | ICD-10-CM | POA: Diagnosis not present

## 2020-09-01 DIAGNOSIS — L570 Actinic keratosis: Secondary | ICD-10-CM | POA: Diagnosis not present

## 2020-09-11 DIAGNOSIS — L570 Actinic keratosis: Secondary | ICD-10-CM | POA: Diagnosis not present

## 2020-09-11 DIAGNOSIS — D0461 Carcinoma in situ of skin of right upper limb, including shoulder: Secondary | ICD-10-CM | POA: Diagnosis not present

## 2020-09-11 DIAGNOSIS — C44229 Squamous cell carcinoma of skin of left ear and external auricular canal: Secondary | ICD-10-CM | POA: Diagnosis not present

## 2020-09-23 DIAGNOSIS — D0422 Carcinoma in situ of skin of left ear and external auricular canal: Secondary | ICD-10-CM | POA: Diagnosis not present

## 2020-09-23 DIAGNOSIS — D0462 Carcinoma in situ of skin of left upper limb, including shoulder: Secondary | ICD-10-CM | POA: Diagnosis not present

## 2020-10-01 ENCOUNTER — Ambulatory Visit: Payer: Medicare Other | Admitting: Adult Health

## 2020-10-07 DIAGNOSIS — D0462 Carcinoma in situ of skin of left upper limb, including shoulder: Secondary | ICD-10-CM | POA: Diagnosis not present

## 2020-10-07 DIAGNOSIS — L905 Scar conditions and fibrosis of skin: Secondary | ICD-10-CM | POA: Diagnosis not present

## 2020-10-21 ENCOUNTER — Encounter: Payer: Self-pay | Admitting: Internal Medicine

## 2020-10-21 NOTE — Progress Notes (Signed)
MEDICARE ANNUAL WELLNESS VISIT AND FOLLOW UP Assessment:   Diagnoses and all orders for this visit:  Encounter for Medicare annual wellness exam Due annually   Essential hypertension Well controlled; continue medication Monitor blood pressure at home; call if consistently over 130/80 Continue DASH diet.   Reminder to go to the ER if any CP, SOB, nausea, dizziness, severe HA, changes vision/speech, left arm numbness and tingling and jaw pain.  Coronary artery disease involving coronary bypass graft of native heart, Other angina ([place]) very infrequent CCS class II exertional angina pectoris; continue BB; followed by cardiology (Dr. Sallyanne Kuster) Continue ASA, close management of cholesterol   Atherosclerosis of abdominal aorta ([place]) Per CXR 2017 Control blood pressure, cholesterol, glucose, increase exercise.   Gastroesophageal reflux disease, esophagitis presence not specified Well managed with PRN famotidine Discussed diet, avoiding triggers and other lifestyle changes  Irritable bowel syndrome, unspecified type Avoid trigger foods; currently stable with lifestyle modification only  Type 2 diabetes mellitus with stage 2 chronic kidney disease, without long-term current use of insulin (HCC) Continue metformin Continue diet and exercise.  Perform daily foot/skin check, notify office of any concerning changes.  -     Hemoglobin A1c  CKD II associated with T2DM (HCC)  Increase fluids, avoid NSAIDS, monitor sugars, will monitor  Hypothyroidism, unspecified type Continue synthroid reminded to take on an empty stomach 30-76mins before food.  -     TSH  Mixed hyperlipidemia associated with T2DM (HCC) Continue medications; currently at LDL goal <70 Continue low cholesterol diet and exercise.  Check lipid panel.  -     Lipid panel  Vitamin D deficiency Continue supplementation At goal at last visit; defer checking level  Medication management -     CBC with  Differential/Platelet -     CMP/GFR -     Magnesium  Former smoker Remote; quit in [place], folate levels  Over 30 minutes of exam, counseling, chart review, and critical decision making was performed  Future Appointments  Date Time Provider [place]  02/25/2021  3:00 PM Unk Pinto, MD [facility] None  10/22/2021 11:30 AM Unk Pinto, MD [facility] None     Plan:   During the course of the visit the patient was educated and counseled about appropriate screening and preventive services including:    Pneumococcal vaccine   Influenza vaccine  Prevnar 13  Td vaccine  Screening electrocardiogram  Colorectal cancer screening  Diabetes screening  Glaucoma screening  Nutrition counseling    Subjective:  Joshua Higgins is a 75 y.o. male who presents for Medicare Annual Wellness Visit and 3 month follow up for HTN, hyperlipidemia, diabetes with CKD, hypothyroid and vitamin D Def.   Had MOH's surgery under R eye and has done well since, has had several BCC/SCC follows with skin surgery center.   Patient has ASCAD with stenting in 2001, CABG 2008 with very infrequent CCS class II exertional angina pectoris and followed by Dr. Sallyanne Kuster. Recent admission for chest pain in 02/2019 resulted in unremarkable cardiac workup and attributed to GERD.   he has a diagnosis of GERD which is currently managed by famotidine 20 mg BID PRN (few times per month), well controlled.   BMI is Body mass index is 25.84 kg/m., he has been working on diet and exercise, will walk with dog, has been very active around house and yard, cutting down trees. Watching portions.  Wt Readings from Last 3 Encounters:  10/22/20 175 lb (79.4 kg)  07/22/20  173 lb 6.4 oz (78.7 kg)  04/03/20 172 lb 6.4 oz (78.2 kg)   He has aortic atherosclerosis per CXR 2017.  His blood pressure has been controlled at home, today their BP is BP: 132/72 He does workout. He  denies chest pain, shortness of breath, dizziness.   He is on cholesterol medication (rosuvastatin 10 mg twice weekly) and denies myalgias (he stopped taking gemfibrozil due to muscle aches, does not tolerate high dose statins). His cholesterol is at goal. The cholesterol last visit was:   Lab Results  Component Value Date   CHOL 142 07/22/2020   HDL 47 07/22/2020   LDLCALC 75 07/22/2020   TRIG 116 07/22/2020   CHOLHDL 3.0 07/22/2020   He has been working on diet and exercise for T2 diabetes (treated by metformin 1000 mg BID), he is on ASA, Statin, ARB and denies increased appetite, nausea, polydipsia, polyuria, visual disturbances, vomiting and weight loss. He does check fasting glucose in the AM, typically 100-115. Last A1C in the office was:  Lab Results  Component Value Date   HGBA1C 6.2 (H) 07/22/2020   He has CKD II associated with T2DM monitored at this office:  Lab Results  Component Value Date   GFRNONAA 81 07/22/2020   He is on thyroid medication. His medication was not changed last visit. Takes 50 mcg daily, takes with other meds in the morning.  Lab Results  Component Value Date   TSH 1.47 07/22/2020    Patient is on Vitamin D supplement and at goal at last check:   Lab Results  Component Value Date   VD25OH 56 07/22/2020     He has had persistent macrocytic hyperchromic cells without significant anemia since 2018, He is taking SL B12 2500 daily Lab Results  Component Value Date   DGLOVFIE33 295 03/12/2019   No results found for: FOLATE    Medication Review: Current Outpatient Medications on File Prior to Visit  Medication Sig Dispense Refill  . acyclovir (ZOVIRAX) 800 MG tablet Take 1 tablet daily for Fever Blister / Mouth Ulcers (Patient taking differently: Take 800 mg by mouth as needed. For Fever Blister / Mouth Ulcers) 90 tablet 0  . aspirin EC 81 MG tablet Take 1 tablet (81 mg total) by mouth daily. 90 tablet 3  . atenolol (TENORMIN) 50 MG tablet Take 1  tablet Daily for BP & Heart 90 tablet 3  . Lexicographer BLOOD SUGAR ONCE A DAY 100 each 0  . Blood Glucose Monitoring Suppl DEVI Use kit to check blood sugar once a day 1 each 0  . Cholecalciferol 5000 UNITS TABS Take 1 tablet by mouth daily.    . CONTOUR NEXT TEST test strip USE TO TEST BLOOD SUGAR ONCE DAILY 100 strip 12  . Cyanocobalamin (B-12) 1000 MCG SUBL Place 1 tablet under the tongue daily.    . famotidine (PEPCID) 20 MG tablet Take 1 tablet (20 mg total) by mouth 2 (two) times daily. 60 tablet 3  . Lancets MISC Use to test blood sugar once daily 200 each 3  . levothyroxine (SYNTHROID) 50 MCG tablet TAKE 1 TABLET DAILY ON AN EMPTY STOMACH WITH ONLY WATER FOR 30 MINUTES & NO ANTACID MEDS, CALCIUM OR MAGNESIUM FOR 4 HOURS & AVOID BIOTIN 90 tablet 3  . losartan (COZAAR) 25 MG tablet TAKE 1/2 TABLET BY MOUTH AT BEDTIME 45 tablet 2  . metFORMIN (GLUCOPHAGE-XR) 500 MG 24 hr tablet TAKE 2 TABLETS 2 TIMES PER DAY WITH  MEALS FOR DIABETES 360 tablet 0  . Multiple Vitamin (MULTIVITAMIN) tablet Take 1 tablet by mouth at bedtime.     . Omega-3 Fatty Acids (FISH OIL) 1000 MG CAPS Take 1,000 mg by mouth at bedtime.     . rosuvastatin (CRESTOR) 10 MG tablet TAKE 1 TABLET 2 X /WEEK FOR CHOLESTEROL 26 tablet 3   No current facility-administered medications on file prior to visit.    Allergies: Allergies  Allergen Reactions  . Gemfibrozil Other (See Comments)    Muscle aches  . Ace Inhibitors Cough  . Lipitor [Atorvastatin] Other (See Comments)    Joint pain.    Current Problems (verified) has [name] w/Stage 2 CKD ([place]); Hyperlipidemia associated with type 2 diabetes mellitus ([place]); Hypertension; GERD (gastroesophageal reflux disease); IBS (irritable bowel syndrome); Vitamin D deficiency; Medication management; CAD s/p CABG; Hypothyroidism; Atherosclerosis of abdominal aorta ([place]); CKD stage 2 due to type 2 diabetes mellitus ([place]); BMI 24.0-24.9, adult; Former smoker;  History of Mohs micrographic surgery for skin cancer; Macrocytic anemia; and B12 deficiency on their problem list.  Screening Tests Immunization History  Administered Date(s) Administered  . DT (Pediatric) 08/29/2003, 06/04/2015  . Influenza Split 02/21/2019  . Influenza, High Dose Seasonal PF 04/03/2018, 02/21/2019, 03/05/2020  . Influenza-Unspecified 04/16/2014, 03/30/2016, 04/02/2017  . Moderna Sars-Covid-2 Vaccination 07/16/2019, 08/16/2019, 04/27/2020  . Pneumococcal Conjugate-13 12/26/2013  . Pneumococcal Polysaccharide-23 01/29/2010  . Zoster 05/11/2010  . Zoster Recombinat (Shingrix) 01/31/2017, 07/05/2017, 08/09/2017    Preventative care: Last colonoscopy: 2005, 2018 following positive cologuard which was negative, DONE per GI Cologuard: 2018  CXR 02/2019  Prior vaccinations: TD or Tdap: 2016  Influenza: 03/2020 Pneumococcal: 2011 Prevnar13: 2015 Shingles/Zostavax: 2011, 2019 2/2 Covid 19: 2/2, 2021, 2 boosters  Names of Other Physician/Practitioners you currently use: 1. [facility] here for primary care 2. Dr. Gershon Crane, eye doctor, last visit  - diabetic eye exam 05/07/2020 - report received and abstracted 3. Dr. Isac Caddy, dentist, last visit  2022, typically goes q30m  Patient Care Team: Unk Pinto, MD as PCP - General (Internal Medicine) Sanda Klein, MD as PCP - Cardiology (Cardiology) Rutherford Guys, MD as Consulting Physician (Ophthalmology) Kristeen Miss, MD as Consulting Physician (Neurosurgery) Teena Irani, MD (Inactive) as Consulting Physician (Gastroenterology)  Surgical: He  has a past surgical history that includes Cardiac surgery; Knee surgery (Right); Back surgery; Coronary angioplasty (2001); Coronary artery bypass graft; Surgery scrotal / testicular; Elbow surgery (Left); Colonoscopy; Lumbar laminectomy/decompression microdiscectomy (Right, 08/04/2014); and Cataract extraction, bilateral (Bilateral,  2018). Family His family history includes Arthritis in his mother; CAD in his brother and sister; Diabetes in his maternal grandmother; Heart attack in his brother and father; Hypertension in his brother; Ulcers in his mother. Social history  He reports that he quit smoking about 47 years ago. He has never used smokeless tobacco. He reports that he does not drink alcohol and does not use drugs.  MEDICARE WELLNESS OBJECTIVES: Physical activity: Current Exercise Habits: Home exercise routine, Type of exercise: walking, Time (Minutes): 20, Frequency (Times/Week): 7, Weekly Exercise (Minutes/Week): 140, Intensity: Mild, Exercise limited by: None identified Cardiac risk factors: Cardiac Risk Factors include: advanced age (>79men, >5 women);dyslipidemia;hypertension;diabetes mellitus;male gender Depression/mood screen:   Depression screen [facility] 2/9 10/22/2020  Decreased Interest 0  Down, Depressed, Hopeless 0  PHQ - 2 Score 0    ADLs:  In your present state of health, do you have any difficulty performing the following activities: 10/22/2020 12/23/2019  Hearing? N N  Vision? N N  Difficulty concentrating or making decisions? N N  Walking or climbing stairs? N N  Dressing or bathing? N N  Doing errands, shopping? N N  Some recent data might be hidden     Cognitive Testing  Alert? Yes  Normal Appearance?Yes  Oriented to person? Yes  Place? Yes   Time? Yes  Recall of three objects?  Yes  Can perform simple calculations? Yes  Displays appropriate judgment?Yes  Can read the correct time from a watch face?Yes  EOL planning: Does Patient Have a Medical Advance Directive?: Yes Type of Advance Directive: [place] will Does patient want to make changes to medical advance directive?: No - Patient declined Copy of [place] in Chart?: No - copy requested   Objective:   Today's Vitals   10/22/20 1119  BP: 132/72  Pulse: 68  Temp: (!) 97.2 F (36.2  C)  SpO2: 97%  Weight: 175 lb (79.4 kg)  Height: $Remove'5\' 9"'[id number]$  (1.753 m)   Body mass index is 25.84 kg/m.  General appearance: alert, no distress, WD/WN, male HEENT: normocephalic, sclerae anicteric, TMs pearly, nares patent, no discharge or erythema, pharynx normal Oral cavity: MMM, no lesions Neck: supple, no lymphadenopathy, no thyromegaly, no masses Heart: RRR, normal S1, S2, no murmurs Lungs: CTA bilaterally, no wheezes, rhonchi, or rales Abdomen: +bs, soft, non tender, non distended, no masses, no hepatomegaly, no splenomegaly Musculoskeletal: nontender, no swelling, no obvious deformity Extremities: no edema, no cyanosis, no clubbing Pulses: 2+ symmetric, upper 1+ diminished bilateral lower extremities, normal cap refill Neurological: alert, oriented x 3, CN2-12 intact, strength normal upper extremities and lower extremities, sensation normal throughout, DTRs 2+ throughout, no cerebellar signs, gait normal Psychiatric: normal affect, behavior normal, pleasant   Medicare Attestation I have personally reviewed: The patient's medical and social history Their use of alcohol, tobacco or illicit drugs Their current medications and supplements The patient's functional ability including ADLs,fall risks, home safety risks, cognitive, and hearing and visual impairment Diet and physical activities Evidence for depression or mood disorders  The patient's weight, height, BMI, and visual acuity have been recorded in the chart.  I have made referrals, counseling, and provided education to the patient based on review of the above and I have provided the patient with a written personalized care plan for preventive services.     Izora Ribas, NP   10/22/2020

## 2020-10-22 ENCOUNTER — Encounter: Payer: Self-pay | Admitting: Adult Health

## 2020-10-22 ENCOUNTER — Other Ambulatory Visit: Payer: Self-pay

## 2020-10-22 ENCOUNTER — Ambulatory Visit (INDEPENDENT_AMBULATORY_CARE_PROVIDER_SITE_OTHER): Payer: Medicare Other | Admitting: Adult Health

## 2020-10-22 VITALS — BP 132/72 | HR 68 | Temp 97.2°F | Ht 69.0 in | Wt 175.0 lb

## 2020-10-22 DIAGNOSIS — Z87891 Personal history of nicotine dependence: Secondary | ICD-10-CM

## 2020-10-22 DIAGNOSIS — D539 Nutritional anemia, unspecified: Secondary | ICD-10-CM | POA: Diagnosis not present

## 2020-10-22 DIAGNOSIS — E1122 Type 2 diabetes mellitus with diabetic chronic kidney disease: Secondary | ICD-10-CM | POA: Diagnosis not present

## 2020-10-22 DIAGNOSIS — E785 Hyperlipidemia, unspecified: Secondary | ICD-10-CM

## 2020-10-22 DIAGNOSIS — Z0001 Encounter for general adult medical examination with abnormal findings: Secondary | ICD-10-CM

## 2020-10-22 DIAGNOSIS — N182 Chronic kidney disease, stage 2 (mild): Secondary | ICD-10-CM | POA: Diagnosis not present

## 2020-10-22 DIAGNOSIS — E039 Hypothyroidism, unspecified: Secondary | ICD-10-CM

## 2020-10-22 DIAGNOSIS — E538 Deficiency of other specified B group vitamins: Secondary | ICD-10-CM | POA: Diagnosis not present

## 2020-10-22 DIAGNOSIS — E1169 Type 2 diabetes mellitus with other specified complication: Secondary | ICD-10-CM | POA: Diagnosis not present

## 2020-10-22 DIAGNOSIS — Z79899 Other long term (current) drug therapy: Secondary | ICD-10-CM

## 2020-10-22 DIAGNOSIS — Z6824 Body mass index (BMI) 24.0-24.9, adult: Secondary | ICD-10-CM | POA: Diagnosis not present

## 2020-10-22 DIAGNOSIS — Z0000 Encounter for general adult medical examination without abnormal findings: Secondary | ICD-10-CM

## 2020-10-22 DIAGNOSIS — I700 Atherosclerosis of aorta: Secondary | ICD-10-CM | POA: Diagnosis not present

## 2020-10-22 DIAGNOSIS — R6889 Other general symptoms and signs: Secondary | ICD-10-CM | POA: Diagnosis not present

## 2020-10-22 DIAGNOSIS — D649 Anemia, unspecified: Secondary | ICD-10-CM

## 2020-10-22 DIAGNOSIS — Z23 Encounter for immunization: Secondary | ICD-10-CM | POA: Diagnosis not present

## 2020-10-22 DIAGNOSIS — I10 Essential (primary) hypertension: Secondary | ICD-10-CM | POA: Diagnosis not present

## 2020-10-22 DIAGNOSIS — I25810 Atherosclerosis of coronary artery bypass graft(s) without angina pectoris: Secondary | ICD-10-CM | POA: Diagnosis not present

## 2020-10-22 DIAGNOSIS — E559 Vitamin D deficiency, unspecified: Secondary | ICD-10-CM | POA: Diagnosis not present

## 2020-10-22 MED ORDER — TRIAMCINOLONE ACETONIDE 55 MCG/ACT NA AERO: 1.0000 | INHALATION_SPRAY | Freq: Every day | NASAL | 2 refills | Status: DC | PRN

## 2020-10-22 MED ORDER — MECLIZINE HCL 25 MG PO TABS: ORAL_TABLET | ORAL | 0 refills | Status: DC

## 2020-10-22 NOTE — Patient Instructions (Addendum)
Joshua Higgins , Thank you for taking time to come for your Medicare Wellness Visit. I appreciate your ongoing commitment to your health goals. Please review the following plan we discussed and let me know if I can assist you in the future.   These are the goals we discussed: Goals    . Exercise 150 min/wk Moderate Activity    . LDL CALC < 70       This is a list of the screening recommended for you and due dates:  Health Maintenance  Topic Date Due  . Complete foot exam   12/22/2020  . Hemoglobin A1C  01/19/2021  . Flu Shot  02/01/2021  . Eye exam for diabetics  05/07/2021  . Tetanus Vaccine  06/03/2025  . COVID-19 Vaccine  Completed  .  Hepatitis C: One time screening is recommended by Center for Disease Control  (CDC) for  adults born from [birth year] through 1967.   Completed  . Pneumonia vaccines  Completed  . HPV Vaccine  Aged Out       High-Fiber Eating Plan Fiber, also called dietary fiber, is a type of carbohydrate. It is found foods such as fruits, vegetables, whole grains, and beans. A high-fiber diet can have many health benefits. Your health care provider may recommend a high-fiber diet to help:  Prevent constipation. Fiber can make your bowel movements more regular.  Lower your cholesterol.  Relieve the following conditions: ? Inflammation of veins in the anus (hemorrhoids). ? Inflammation of specific areas of the digestive tract (uncomplicated diverticulosis). ? A problem of the large intestine, also called the colon, that sometimes causes pain and diarrhea (irritable bowel syndrome, or IBS).  Prevent overeating as part of a weight-loss plan.  Prevent heart disease, type 2 diabetes, and certain cancers. What are tips for following this plan? Reading food labels  Check the nutrition facts label on food products for the amount of dietary fiber. Choose foods that have 5 grams of fiber or more per serving.  The goals for recommended daily fiber intake include: ? Men  (age 28 or younger): 34-38 g. ? Men (over age 88): 28-34 g. ? Women (age 62 or younger): 25-28 g. ? Women (over age 31): 22-25 g. Your daily fiber goal is _____________ g.   Shopping  Choose whole fruits and vegetables instead of processed forms, such as apple juice or applesauce.  Choose a wide variety of high-fiber foods such as avocados, lentils, oats, and kidney beans.  Read the nutrition facts label of the foods you choose. Be aware of foods with added fiber. These foods often have high sugar and sodium amounts per serving. Cooking  Use whole-grain flour for baking and cooking.  Cook with brown rice instead of white rice. Meal planning  Start the day with a breakfast that is high in fiber, such as a cereal that contains 5 g of fiber or more per serving.  Eat breads and cereals that are made with whole-grain flour instead of refined flour or white flour.  Eat brown rice, bulgur wheat, or millet instead of white rice.  Use beans in place of meat in soups, salads, and pasta dishes.  Be sure that half of the grains you eat each day are whole grains. General information  You can get the recommended daily intake of dietary fiber by: ? Eating a variety of fruits, vegetables, grains, nuts, and beans. ? Taking a fiber supplement if you are not able to take in enough fiber in your  diet. It is better to get fiber through food than from a supplement.  Gradually increase how much fiber you consume. If you increase your intake of dietary fiber too quickly, you may have bloating, cramping, or gas.  Drink plenty of water to help you digest fiber.  Choose high-fiber snacks, such as berries, raw vegetables, nuts, and popcorn. What foods should I eat? Fruits Berries. Pears. Apples. Oranges. Avocado. Prunes and raisins. Dried figs. Vegetables Sweet potatoes. Spinach. Kale. Artichokes. Cabbage. Broccoli. Cauliflower. Green peas. Carrots. Squash. Grains Whole-grain breads. Multigrain  cereal. Oats and oatmeal. Brown rice. Barley. Bulgur wheat. [place]. Quinoa. Bran muffins. Popcorn. Rye wafer crackers. Meats and other proteins Navy beans, kidney beans, and pinto beans. Soybeans. Split peas. Lentils. Nuts and seeds. Dairy Fiber-fortified yogurt. Beverages Fiber-fortified soy milk. Fiber-fortified orange juice. Other foods Fiber bars. The items listed above may not be a complete list of recommended foods and beverages. Contact a dietitian for more information. What foods should I avoid? Fruits Fruit juice. Cooked, strained fruit. Vegetables Fried potatoes. Canned vegetables. Well-cooked vegetables. Grains White bread. Pasta made with refined flour. White rice. Meats and other proteins Fatty cuts of meat. Fried chicken or fried fish. Dairy Milk. Yogurt. Cream cheese. Sour cream. Fats and oils Butters. Beverages Soft drinks. Other foods Cakes and pastries. The items listed above may not be a complete list of foods and beverages to avoid. Talk with your dietitian about what choices are best for you. Summary  Fiber is a type of carbohydrate. It is found in foods such as fruits, vegetables, whole grains, and beans.  A high-fiber diet has many benefits. It can help to prevent constipation, lower blood cholesterol, aid weight loss, and reduce your risk of heart disease, diabetes, and certain cancers.  Increase your intake of fiber gradually. Increasing fiber too quickly may cause cramping, bloating, and gas. Drink plenty of water while you increase the amount of fiber you consume.  The best sources of fiber include whole fruits and vegetables, whole grains, nuts, seeds, and beans. This information is not intended to replace advice given to you by your health care provider. Make sure you discuss any questions you have with your health care provider. Document Revised: 10/24/2019 Document Reviewed: 10/24/2019 Elsevier Patient Education  2021 [employer].

## 2020-10-23 ENCOUNTER — Other Ambulatory Visit: Payer: Self-pay | Admitting: Adult Health

## 2020-10-23 MED ORDER — ROSUVASTATIN CALCIUM 10 MG PO TABS: ORAL_TABLET | ORAL | 3 refills | Status: DC

## 2020-10-24 LAB — HEMOGLOBIN A1C
Hgb A1c MFr Bld: 6 % of total Hgb — ABNORMAL HIGH (ref <5.7)
Mean Plasma Glucose: 126 mg/dL
eAG (mmol/L): 7 mmol/L

## 2020-10-24 LAB — CBC WITH DIFFERENTIAL/PLATELET
Absolute Monocytes: 390 cells/uL (ref 200–950)
Basophils Absolute: 18 cells/uL (ref 0–200)
Basophils Relative: 0.3 %
Eosinophils Absolute: 108 cells/uL (ref 15–500)
Eosinophils Relative: 1.8 %
HCT: 39.7 % (ref 38.5–50.0)
Hemoglobin: 13.2 g/dL (ref 13.2–17.1)
Lymphs Abs: 2262 cells/uL (ref 850–3900)
MCH: 34.4 pg — ABNORMAL HIGH (ref 27.0–33.0)
MCHC: 33.2 g/dL (ref 32.0–36.0)
MCV: 103.4 fL — ABNORMAL HIGH (ref 80.0–100.0)
MPV: 9.8 fL (ref 7.5–12.5)
Monocytes Relative: 6.5 %
Neutro Abs: 3222 cells/uL (ref 1500–7800)
Neutrophils Relative %: 53.7 %
Platelets: 194 10*3/uL (ref 140–400)
RBC: 3.84 10*6/uL — ABNORMAL LOW (ref 4.20–5.80)
RDW: 12.1 % (ref 11.0–15.0)
Total Lymphocyte: 37.7 %
WBC: 6 10*3/uL (ref 3.8–10.8)

## 2020-10-24 LAB — MAGNESIUM: Magnesium: 1.9 mg/dL (ref 1.5–2.5)

## 2020-10-24 LAB — COMPLETE METABOLIC PANEL WITH GFR
AG Ratio: 1.8 (calc) (ref 1.0–2.5)
ALT: 18 U/L (ref 9–46)
AST: 16 U/L (ref 10–35)
Albumin: 4.4 g/dL (ref 3.6–5.1)
Alkaline phosphatase (APISO): 48 U/L (ref 35–144)
BUN: 15 mg/dL (ref 7–25)
CO2: 31 mmol/L (ref 20–32)
Calcium: 9.7 mg/dL (ref 8.6–10.3)
Chloride: 102 mmol/L (ref 98–110)
Creat: 1.01 mg/dL (ref 0.70–1.18)
GFR, Est African American: 83 mL/min/{1.73_m2} (ref >=60)
GFR, Est Non African American: 72 mL/min/{1.73_m2} (ref >=60)
Globulin: 2.4 g/dL (calc) (ref 1.9–3.7)
Glucose, Bld: 129 mg/dL — ABNORMAL HIGH (ref 65–99)
Potassium: 5.1 mmol/L (ref 3.5–5.3)
Sodium: 140 mmol/L (ref 135–146)
Total Bilirubin: 0.6 mg/dL (ref 0.2–1.2)
Total Protein: 6.8 g/dL (ref 6.1–8.1)

## 2020-10-24 LAB — TSH: TSH: 1.68 mIU/L (ref 0.40–4.50)

## 2020-10-24 LAB — LIPID PANEL
Cholesterol: 161 mg/dL (ref <200)
HDL: 47 mg/dL (ref >=40)
LDL Cholesterol (Calc): 84 mg/dL (calc)
Non-HDL Cholesterol (Calc): 114 mg/dL (calc) (ref <130)
Total CHOL/HDL Ratio: 3.4 (calc) (ref <5.0)
Triglycerides: 201 mg/dL — ABNORMAL HIGH (ref <150)

## 2020-10-24 LAB — FOLATE RBC: RBC Folate: 767 ng/mL RBC (ref >280)

## 2020-10-24 LAB — VITAMIN B12: Vitamin B-12: >2000 pg/mL — ABNORMAL HIGH (ref 200–1100)

## 2020-11-12 ENCOUNTER — Other Ambulatory Visit: Payer: Self-pay | Admitting: Adult Health Nurse Practitioner

## 2020-12-21 ENCOUNTER — Other Ambulatory Visit: Payer: Self-pay | Admitting: Cardiovascular Disease

## 2020-12-23 ENCOUNTER — Other Ambulatory Visit: Payer: Self-pay

## 2020-12-23 DIAGNOSIS — N182 Chronic kidney disease, stage 2 (mild): Secondary | ICD-10-CM

## 2020-12-23 DIAGNOSIS — E1122 Type 2 diabetes mellitus with diabetic chronic kidney disease: Secondary | ICD-10-CM

## 2020-12-23 MED ORDER — CONTOUR NEXT TEST VI STRP: ORAL_STRIP | 12 refills | Status: DC

## 2020-12-24 ENCOUNTER — Encounter: Payer: Medicare Other | Admitting: Internal Medicine

## 2021-01-13 ENCOUNTER — Other Ambulatory Visit: Payer: Self-pay | Admitting: Adult Health

## 2021-01-15 ENCOUNTER — Other Ambulatory Visit: Payer: Self-pay | Admitting: Internal Medicine

## 2021-01-23 IMAGING — DX PORTABLE CHEST - 1 VIEW
1 series · 1 of 1 positions shown · non-contrast
Comparison: 10/10/2015.

CLINICAL DATA: Chest pain and shortness of breath.

EXAM:
PORTABLE CHEST 1 VIEW

[chest ap]
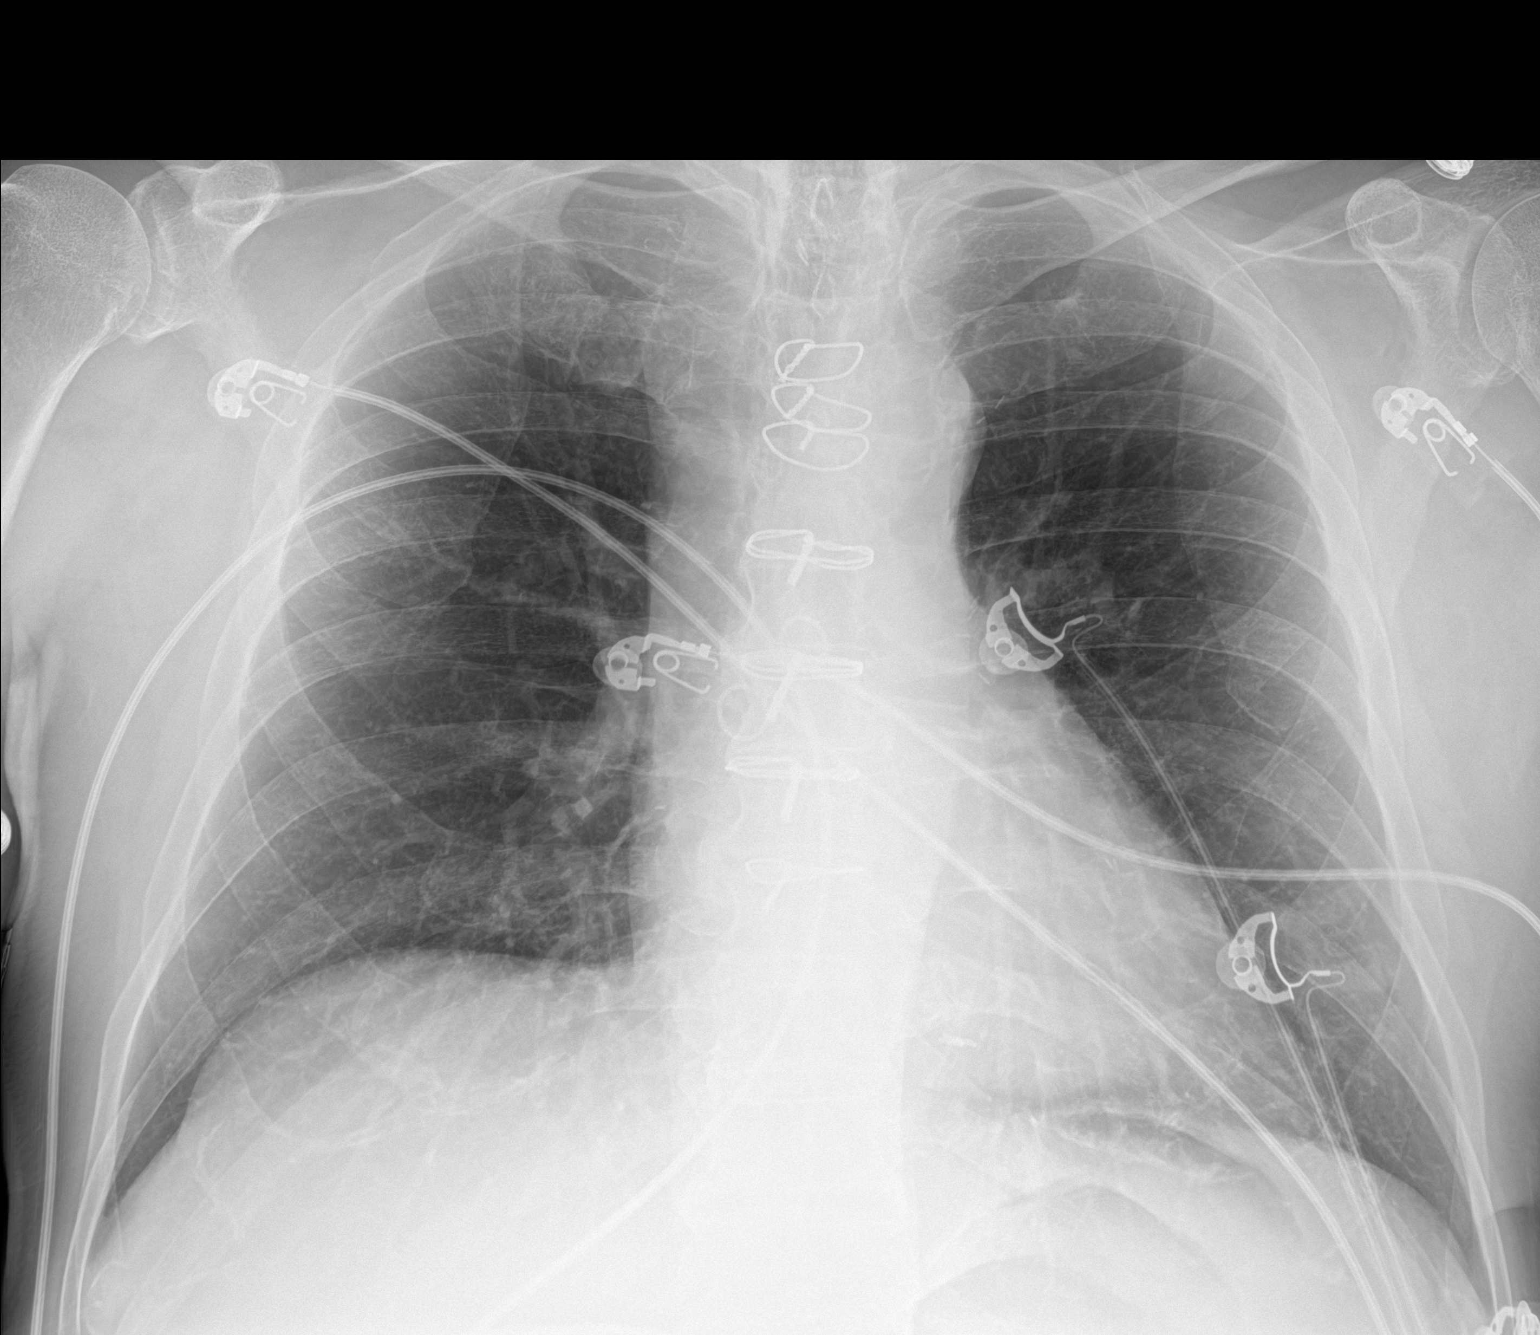

[1 of 1 positions shown; findings below may reference images not displayed]

FINDINGS: Normal sized heart. Stable post CABG changes. Clear lungs with
normal vascularity. Diffuse osteopenia.
IMPRESSION: No acute abnormality.

## 2021-02-02 ENCOUNTER — Other Ambulatory Visit: Payer: Self-pay

## 2021-02-02 ENCOUNTER — Ambulatory Visit (INDEPENDENT_AMBULATORY_CARE_PROVIDER_SITE_OTHER): Payer: Medicare Other | Admitting: Adult Health

## 2021-02-02 ENCOUNTER — Encounter: Payer: Self-pay | Admitting: Adult Health

## 2021-02-02 VITALS — HR 82 | Temp 97.5°F

## 2021-02-02 DIAGNOSIS — I25810 Atherosclerosis of coronary artery bypass graft(s) without angina pectoris: Secondary | ICD-10-CM | POA: Diagnosis not present

## 2021-02-02 DIAGNOSIS — U071 COVID-19: Secondary | ICD-10-CM

## 2021-02-02 DIAGNOSIS — Z1152 Encounter for screening for COVID-19: Secondary | ICD-10-CM | POA: Diagnosis not present

## 2021-02-02 HISTORY — DX: COVID-19: U07.1

## 2021-02-02 LAB — POC COVID19 BINAXNOW: SARS Coronavirus 2 Ag: POSITIVE — AB

## 2021-02-02 MED ORDER — DEXAMETHASONE 1 MG PO TABS: ORAL_TABLET | ORAL | 0 refills | Status: DC

## 2021-02-02 MED ORDER — PROMETHAZINE-DM 6.25-15 MG/5ML PO SYRP: 5.0000 mL | ORAL_SOLUTION | Freq: Four times a day (QID) | ORAL | 1 refills | Status: DC | PRN

## 2021-02-02 NOTE — Progress Notes (Signed)
THIS ENCOUNTER IS A VIRTUAL VISIT DUE TO COVID-19 - PATIENT WAS NOT SEEN IN THE OFFICE.  PATIENT HAS CONSENTED TO VIRTUAL VISIT / TELEMEDICINE VISIT   Virtual Visit via telephone Note  I connected with  Joshua Higgins on 02/02/2021 by telephone.  I verified that I am speaking with the correct person using two identifiers.    I discussed the limitations of evaluation and management by telemedicine and the availability of in person appointments. The patient expressed understanding and agreed to proceed.  History of Present Illness:  Pulse 82   Temp (!) 97.5 F (36.4 C)   SpO2 96%  75 y.o. patient contacted office reporting URI sx. he tested positive by rapid screen here prior to appointment. OV was conducted by telephone to minimize exposure. This patient was vaccinated for covid 19, with booster last 10/21/2020.   Sx began 5 days ago with cough, HA, progressive over the weekend, started having temp yesterday (up to 100.4 yesterday). He has been taking tylenol for headache and body ache. He notes cough more in his chest, dry, non-productive. Denies CP, dyspnea, wheezing. Feeling mildly improved today. Some lower abdominal pressure with urination but otherwise denies burning, urine character change, perineal tenderness, straining or dribbling.   He has been pushing water intake, at least 4-6 bottles   Treatments tried so far: tylenol  Exposures: funeral  He reports glucose well controlled, fasting 110-120.  Last A1C in the office was:  Lab Results  Component Value Date   HGBA1C 6.0 (H) 10/22/2020    Immunization History  Administered Date(s) Administered   DT (Pediatric) 08/29/2003, 06/04/2015   Influenza Split 02/21/2019   Influenza, High Dose Seasonal PF 04/03/2018, 02/21/2019, 03/05/2020   Influenza-Unspecified 04/16/2014, 03/30/2016, 04/02/2017   Moderna SARS-COV2 Booster Vaccination 10/21/2020   Moderna Sars-Covid-2 Vaccination 07/16/2019, 08/16/2019, 04/27/2020    Pneumococcal Conjugate-13 12/26/2013   Pneumococcal Polysaccharide-23 01/29/2010   Zoster Recombinat (Shingrix) 01/31/2017, 07/05/2017, 08/09/2017   Zoster, Live 05/11/2010      Medications  Current Outpatient Medications (Endocrine & Metabolic):    levothyroxine (SYNTHROID) 50 MCG tablet, TAKE 1 TABLET DAILY ON AN EMPTY STOMACH WITH ONLY WATER FOR 30 MINUTES & NO ANTACID MEDS, CALCIUM OR MAGNESIUM FOR 4 HOURS & AVOID BIOTIN   metFORMIN (GLUCOPHAGE-XR) 500 MG 24 hr tablet, TAKE 2 TABLETS 2 TIMES PER DAY WITH MEALS FOR DIABETES  Current Outpatient Medications (Cardiovascular):    atenolol (TENORMIN) 50 MG tablet, TAKE 1 TABLET DAILY FOR BP & HEART   losartan (COZAAR) 25 MG tablet, TAKE 1/2 TABLET BY MOUTH EVERY DAY AT BEDTIME   rosuvastatin (CRESTOR) 10 MG tablet, Take 1 tab 3 days a week for cholesterol.  Current Outpatient Medications (Respiratory):    triamcinolone (NASACORT) 55 MCG/ACT AERO nasal inhaler, Place 1 spray into the nose daily as needed.  Current Outpatient Medications (Analgesics):    aspirin EC 81 MG tablet, Take 1 tablet (81 mg total) by mouth daily.  Current Outpatient Medications (Hematological):    Cyanocobalamin (B-12) 1000 MCG SUBL, Place 1 tablet under the tongue daily.  Current Outpatient Medications (Other):    acyclovir (ZOVIRAX) 800 MG tablet, Take 1 tablet daily for Fever Blister / Mouth Ulcers (Patient taking differently: Take 800 mg by mouth as needed. For Fever Blister / Mouth Ulcers)   Bayer Microlet Lancets lancets, CHECK BLOOD SUGAR ONCE A DAY   Blood Glucose Monitoring Suppl DEVI, Use kit to check blood sugar once a day   Cholecalciferol 5000 UNITS TABS, Take 1  tablet by mouth daily.   famotidine (PEPCID) 20 MG tablet, TAKE 1 TABLET BY MOUTH TWICE A DAY   glucose blood (CONTOUR NEXT TEST) test strip, USE TO TEST BLOOD SUGAR ONCE DAILY   Lancets MISC, Use to test blood sugar once daily   meclizine (ANTIVERT) 25 MG tablet, 1/2-1 pill up to 3 times  daily for motion sickness/dizziness   Multiple Vitamin (MULTIVITAMIN) tablet, Take 1 tablet by mouth at bedtime.    Omega-3 Fatty Acids (FISH OIL) 1000 MG CAPS, Take 1,000 mg by mouth at bedtime.   Allergies:  Allergies  Allergen Reactions   Gemfibrozil Other (See Comments)    Muscle aches   Ace Inhibitors Cough   Lipitor [Atorvastatin] Other (See Comments)    Joint pain.    Problem list He has [name] w/Stage 2 CKD ([place]); Hyperlipidemia associated with type 2 diabetes mellitus ([place]); Hypertension; GERD (gastroesophageal reflux disease); IBS (irritable bowel syndrome); Vitamin D deficiency; Medication management; CAD s/p CABG; Hypothyroidism; Atherosclerosis of abdominal aorta ([place]); CKD stage 2 due to type 2 diabetes mellitus ([place]); BMI 24.0-24.9, adult; Former smoker; History of Mohs micrographic surgery for skin cancer; Macrocytic anemia; and B12 deficiency on their problem list.   Social History:   reports that he quit smoking about 47 years ago. He has never used smokeless tobacco. He reports that he does not drink alcohol and does not use drugs.  Observations/Objective:  General : Well sounding patient in no apparent distress HEENT: no hoarseness, no cough for duration of visit Lungs: speaks in complete sentences, no audible wheezing, no apparent distress Neurological: alert, oriented x 3 Psychiatric: pleasant, judgement appropriate   Assessment and Plan:  Covid 19 Covid 19 positive per rapid screening test as drive through Risk factors include: age, T2DM, htn, former smoker Symptoms are: mild Due to day 5 of treatment, sx improving Immue support with vitamin D 1000 mg daily, zinc 50 mg daily, vitamin D Discussed and sent in steroid taper and cough med Reviewed doing regular breathing exercises, proning Take tylenol PRN temp 101+ Push hydration Regular ambulation or calf exercises exercises for clot prevention and 81 mg ASA unless contraindicated Sx supportive therapy  suggested Follow up via mychart or telephone if needed Advised patient obtain O2 monitor; present to ED if persistently <88% or with severe dyspnea, CP, fever uncontrolled by tylenol, confusion, sudden decline Should remain in isolation until at least 5 days from onset of sx, 24-48 hours fever free without tylenol, sx such as cough are improved.   Lower abdominal pressure Push fluids, monitor for urinary sx, follow up if not resolving or with any new urinary sx will plan on presumptive tx of UTI   Follow Up Instructions:  I discussed the assessment and treatment plan with the patient. The patient was provided an opportunity to ask questions and all were answered. The patient agreed with the plan and demonstrated an understanding of the instructions.   The patient was advised to call back or seek an in-person evaluation if the symptoms worsen or if the condition fails to improve as anticipated.  I provided 20 minutes of non-face-to-face time during this encounter.   Izora Ribas, NP

## 2021-02-15 ENCOUNTER — Other Ambulatory Visit: Payer: Self-pay | Admitting: Internal Medicine

## 2021-02-25 ENCOUNTER — Encounter: Payer: Self-pay | Admitting: Internal Medicine

## 2021-02-25 ENCOUNTER — Other Ambulatory Visit: Payer: Self-pay

## 2021-02-25 ENCOUNTER — Ambulatory Visit (INDEPENDENT_AMBULATORY_CARE_PROVIDER_SITE_OTHER): Payer: Medicare Other | Admitting: Internal Medicine

## 2021-02-25 VITALS — BP 129/71 | HR 70 | Temp 97.5°F | Resp 16 | Ht 69.0 in | Wt 173.2 lb

## 2021-02-25 DIAGNOSIS — N182 Chronic kidney disease, stage 2 (mild): Secondary | ICD-10-CM | POA: Diagnosis not present

## 2021-02-25 DIAGNOSIS — N401 Enlarged prostate with lower urinary tract symptoms: Secondary | ICD-10-CM | POA: Diagnosis not present

## 2021-02-25 DIAGNOSIS — E1169 Type 2 diabetes mellitus with other specified complication: Secondary | ICD-10-CM

## 2021-02-25 DIAGNOSIS — I700 Atherosclerosis of aorta: Secondary | ICD-10-CM | POA: Diagnosis not present

## 2021-02-25 DIAGNOSIS — E785 Hyperlipidemia, unspecified: Secondary | ICD-10-CM

## 2021-02-25 DIAGNOSIS — Z125 Encounter for screening for malignant neoplasm of prostate: Secondary | ICD-10-CM

## 2021-02-25 DIAGNOSIS — E1122 Type 2 diabetes mellitus with diabetic chronic kidney disease: Secondary | ICD-10-CM

## 2021-02-25 DIAGNOSIS — Z87891 Personal history of nicotine dependence: Secondary | ICD-10-CM

## 2021-02-25 DIAGNOSIS — Z1212 Encounter for screening for malignant neoplasm of rectum: Secondary | ICD-10-CM

## 2021-02-25 DIAGNOSIS — I10 Essential (primary) hypertension: Secondary | ICD-10-CM

## 2021-02-25 DIAGNOSIS — Z8249 Family history of ischemic heart disease and other diseases of the circulatory system: Secondary | ICD-10-CM

## 2021-02-25 DIAGNOSIS — E559 Vitamin D deficiency, unspecified: Secondary | ICD-10-CM | POA: Diagnosis not present

## 2021-02-25 DIAGNOSIS — I25810 Atherosclerosis of coronary artery bypass graft(s) without angina pectoris: Secondary | ICD-10-CM | POA: Diagnosis not present

## 2021-02-25 DIAGNOSIS — E039 Hypothyroidism, unspecified: Secondary | ICD-10-CM

## 2021-02-25 DIAGNOSIS — Z136 Encounter for screening for cardiovascular disorders: Secondary | ICD-10-CM | POA: Diagnosis not present

## 2021-02-25 DIAGNOSIS — N138 Other obstructive and reflux uropathy: Secondary | ICD-10-CM

## 2021-02-25 DIAGNOSIS — Z1211 Encounter for screening for malignant neoplasm of colon: Secondary | ICD-10-CM

## 2021-02-25 NOTE — Patient Instructions (Signed)

## 2021-02-25 NOTE — Progress Notes (Signed)
Annual  Screening/Preventative Visit  & Comprehensive Evaluation & Examination  Future Appointments  Date Time Provider [place]  02/25/2021  3:00 PM Unk Pinto, MD [facility] None  05/07/2021  3:20 PM Croitoru, Dani Gobble, MD [facility]  10/22/2021 11:30 AM Unk Pinto, MD [facility] None  03/08/2022  2:00 PM Unk Pinto, MD [facility] None            This very nice 75 y.o.male presents for a Screening /Preventative Visit & comprehensive evaluation and management of multiple medical co-morbidities.  Patient has been followed for HTN, [name], [name]  Prediabetes and Vitamin D Deficiency.       HTN predates since     . Patient's BP has been controlled at home.  Today's BP is at goal - 129/71. Patient denies any cardiac symptoms as chest pain, palpitations, shortness of breath, dizziness or ankle swelling.       Patient's hyperlipidemia is controlled with diet and medications. Patient denies myalgias or other medication SE's. Last lipids were at goalexcept elevated Trig's:  Lab Results  Component Value Date   CHOL 161 10/22/2020   HDL 47 10/22/2020   LDLCALC 84 10/22/2020   TRIG 201 (H) 10/22/2020   CHOLHDL 3.4 10/22/2020                                          [name]      Patient has hx/o [name] w/CKD2 (2000)   and patient denies reactive hypoglycemic symptoms, visual blurring, diabetic polys or paresthesias. Last A1c was not at goal:   Lab Results  Component Value Date   HGBA1C 6.0 (H) 10/22/2020          Finally, patient has history of Vitamin D Deficiency ("33" /2008) and last vitamin D was near goal:   Lab Results  Component Value Date   VD25OH 56 07/22/2020     Current Outpatient Medications on File Prior to Visit  Medication Sig   acyclovir (ZOVIRAX) 800 MG tablet Take 1 tablet daily for Fever Blister / Mouth Ulcers (Patient taking differently: Take 800 mg by mouth as  needed. For Fever Blister / Mouth Ulcers)   aspirin EC 81 MG tablet Take 1 tablet (81 mg total) by mouth daily.   atenolol (TENORMIN) 50 MG tablet TAKE 1 TABLET DAILY FOR BP & HEART   Bayer Microlet Lancets lancets CHECK BLOOD SUGAR ONCE A DAY   Blood Glucose Monitoring Suppl DEVI Use kit to check blood sugar once a day   Cholecalciferol 5000 UNITS TABS Take 1 tablet by mouth daily.   Cyanocobalamin (B-12) 1000 MCG SUBL Place 1 tablet under the tongue daily.   famotidine (PEPCID) 20 MG tablet TAKE 1 TABLET BY MOUTH TWICE A DAY   glucose blood (CONTOUR NEXT TEST) test strip USE TO TEST BLOOD SUGAR ONCE DAILY   Lancets MISC Use to test blood sugar once daily    TAKE 1 TABLET DAILY    losartan (COZAAR) 25 MG tablet TAKE 1/2 TABLET BY MOUTH EVERY DAY AT BEDTIME   metFORMIN (GLUCOPHAGE-XR) 500 MG 24 hr tablet TAKE 2 TABLETS 2 TIMES PER DAY WITH MEALS FOR DIABETES   Multiple Vitamin (MULTIVITAMIN) tablet Take 1 tablet by mouth at bedtime.    Omega-3 Fatty Acids (FISH OIL) 1000 MG CAPS Take 1,000 mg by mouth at bedtime.  rosuvastatin (CRESTOR) 10 MG tablet TAKE 1 TABLET 2 X /WEEK FOR CHOLESTEROL   triamcinolone (NASACORT) 55 MCG/ACT AERO nasal inhaler Place 1 spray into the nose daily as needed.    Allergies  Allergen Reactions   Gemfibrozil Other (See Comments)    Muscle aches   Ace Inhibitors Cough   Lipitor [Atorvastatin] Other (See Comments)    Joint pain.    Past Medical History:  Diagnosis Date   Anemia    low iron   Arthritis    Cancer ([place])    skin cancer on face   Cataracts, bilateral    Coronary artery disease    CABG - 2008   Diabetes mellitus without complication ([place])    type II   GERD (gastroesophageal reflux disease)    Hepatitis    Hyperlipidemia    Hypertension    pt states he does not have HTN, takes Atenolol for migraines   Hypothyroidism    Migraine headache with aura    takes Atenolol   Sleep apnea    does not use cpap     Health Maintenance   Topic Date Due   FOOT EXAM  12/22/2020   COVID-19 Vaccine (5 - Booster for Moderna series) 02/20/2021   INFLUENZA VACCINE  02/01/2021   HEMOGLOBIN A1C  04/23/2021   OPHTHALMOLOGY EXAM  05/07/2021   TETANUS/TDAP  06/03/2025   Hepatitis C Screening  Completed   PNA vac Low Risk Adult  Completed   Zoster Vaccines- Shingrix  Completed   HPV VACCINES  Aged Out     Immunization History  Administered Date(s) Administered   DT (Pediatric) 08/29/2003, 06/04/2015   Influenza Split 02/21/2019   Influenza, High Dose Seasonal PF 04/03/2018, 02/21/2019, 03/05/2020   Influenza-Unspecified 04/16/2014, 03/30/2016, 04/02/2017   Moderna SARS-COV2 Booster Vaccination 10/21/2020   Moderna Sars-Covid-2 Vaccination 07/16/2019, 08/16/2019, 04/27/2020   Pneumococcal Conjugate-13 12/26/2013   Pneumococcal Polysaccharide-23 01/29/2010   Zoster Recombinat (Shingrix) 01/31/2017, 07/05/2017, 08/09/2017   Zoster, Live 05/11/2010    Last Colon -  09/07/2016 - Neg - recommended no Repeat Colon due to age  Past Surgical History:  Procedure Laterality Date   BACK SURGERY     CARDIAC SURGERY     Bypass   CATARACT EXTRACTION, BILATERAL Bilateral 2018   Dr. Gershon Crane   COLONOSCOPY     CORONARY ANGIOPLASTY  2001   CORONARY ARTERY BYPASS GRAFT     ELBOW SURGERY Left    due to damaged nerve   KNEE SURGERY Right    arthroscopic   LUMBAR LAMINECTOMY/DECOMPRESSION MICRODISCECTOMY Right 08/04/2014   Procedure: Right Lumbar four-five Microdiskectomy;  Surgeon: Kristeen Miss, MD;  Location: [place] [facility];  Service: Neurosurgery;  Laterality: Right;  Right L4-5 Microdiskectomy   SURGERY SCROTAL / TESTICULAR     as a child/teenager     Family History  Problem Relation Age of Onset   Ulcers Mother    Arthritis Mother    Heart attack Father    CAD Sister    Heart attack Brother    CAD Brother    Hypertension Brother    Diabetes Maternal Grandmother     Social History   Socioeconomic History   Marital  status: Married    Spouse name: Not on file   Number of children: Not on file   Years of education: Not on file   Highest education level: Not on file  Occupational History   Not on file  Tobacco Use   Smoking status: Former  Types: Cigarettes    Quit date: 10/01/1973    Years since quitting: 47.4   Smokeless tobacco: Never  Substance and Sexual Activity   Alcohol use: No   Drug use: No   Sexual activity: Not on file      ROS Constitutional: Denies fever, chills, weight loss/gain, headaches, insomnia,  night sweats or change in      appetite. Does c/o fatigue. Eyes: Denies redness, blurred vision, diplopia, discharge, itchy or watery eyes.  ENT: Denies discharge, congestion, post nasal drip, epistaxis, sore throat, earache, hearing loss, dental pain, Tinnitus, Vertigo, Sinus pain or snoring.  Cardio: Denies chest pain, palpitations, irregular heartbeat, syncope, dyspnea, diaphoresis, orthopnea, PND, claudication or edema Respiratory: denies cough, dyspnea, DOE, pleurisy, hoarseness, laryngitis or wheezing.  Gastrointestinal: Denies dysphagia, heartburn, reflux, water brash, pain, cramps, nausea, vomiting, bloating, diarrhea, constipation, hematemesis, melena, hematochezia, jaundice or hemorrhoids Genitourinary: Denies dysuria, frequency, urgency, nocturia, hesitancy, discharge, hematuria or flank pain Musculoskeletal: Denies arthralgia, myalgia, stiffness, Jt. Swelling, pain, limp or strain/sprain. Denies Falls. Skin: Denies puritis, rash, hives, warts, acne, eczema or change in skin lesion Neuro: No weakness, tremor, incoordination, spasms, paresthesia or pain Psychiatric: Denies confusion, memory loss or sensory loss. Denies Depression. Endocrine: Denies change in weight, skin, hair change, nocturia, and paresthesia, diabetic polys, visual blurring or hyper / hypo glycemic episodes.  Heme/Lymph: No excessive bleeding, bruising or enlarged lymph nodes.   Physical Exam  BP  129/71   Pulse 70   Temp (!) 97.5 F (36.4 C)   Resp 16   Ht $R'5\' 9"'pX$  (1.753 m)   Wt 173 lb 3.2 oz (78.6 kg)   SpO2 95%   BMI 25.58 kg/m   General Appearance: Well nourished and well groomed and in no apparent distress.  Eyes: PERRLA, EOMs, conjunctiva no swelling or erythema, normal fundi and vessels. Sinuses: No frontal/maxillary tenderness ENT/Mouth: EACs patent / TMs  nl. Nares clear without erythema, swelling, mucoid exudates. Oral hygiene is good. No erythema, swelling, or exudate. Tongue normal, non-obstructing. Tonsils not swollen or erythematous. Hearing normal.  Neck: Supple, thyroid not palpable. No bruits, nodes or JVD. Respiratory: Respiratory effort normal.  BS equal and clear bilateral without rales, rhonci, wheezing or stridor. Cardio: Heart sounds are normal with regular rate and rhythm and no murmurs, rubs or gallops. Peripheral pulses are normal and equal bilaterally without edema. No aortic or femoral bruits. Chest: symmetric with normal excursions and percussion.  Abdomen: Soft, with Nl bowel sounds. Nontender, no guarding, rebound, hernias, masses, or organomegaly.  Lymphatics: Non tender without lymphadenopathy.  Musculoskeletal: Full ROM all peripheral extremities, joint stability, 5/5 strength, and normal gait. Skin: Warm and dry without rashes, lesions, cyanosis, clubbing or  ecchymosis.  Neuro: Cranial nerves intact, reflexes equal bilaterally. Normal muscle tone, no cerebellar symptoms. Sensation intact.  Pysch: Alert and oriented X 3 with normal affect, insight and judgment appropriate.   Assessment and Plan  1. Essential hypertension  - EKG 12-Lead - Urinalysis, Routine w reflex microscopic - Microalbumin / creatinine urine ratio - CBC with Differential/Platelet - COMPLETE METABOLIC PANEL WITH GFR - Magnesium - TSH  2. Hyperlipidemia associated with type 2 diabetes mellitus ([facility])  - EKG 12-Lead - Lipid panel - TSH - Hemoglobin A1c  3. Type 2  diabetes mellitus with stage 2 chronic kidney  disease, without long-term cur  5. Atherosclerosis of abdominal aorta ([place]) by CXR in 2017   - Lipid panel  6. Coronary artery disease involving coronary  bypass graft of native heart  without angina pectoris  - Lipid panel  7. Hypothyroidism  - TSH  8. Screening for colorectal cancer  - POC Hemoccult Bld/Stl   9. Prostate cancer screening  - PSA  10. BPH with obstruction/lower urinary tract symptoms  - PSA  11. Screening for ischemic heart disease  - EKG 12-Lead  12. FHx: heart disease  - EKG 12-Lead  13. Screening for AAA (aortic abdominal aneurysm)  - CBC with Differential/Platelet - COMPLETE METABOLIC PANEL WITH GFR - Magnesium - Lipid panel - TSH - Hemoglobin A1c - Insulin, random - VITAMIN D 25 Hydroxy          Patient was counseled in prudent diet, weight control to achieve/maintain BMI less than 25, BP monitoring, regular exercise and medications as discussed.  Discussed med effects and SE's. Routine screening labs and tests as requested with regular follow-up as recommended. Over 40 minutes of exam, counseling, chart review and high complex critical decision making was performed   Kirtland Bouchard, MD

## 2021-02-26 LAB — LIPID PANEL
Cholesterol: 165 mg/dL (ref <200)
HDL: 48 mg/dL (ref >=40)
LDL Cholesterol (Calc): 88 mg/dL (calc)
Non-HDL Cholesterol (Calc): 117 mg/dL (calc) (ref <130)
Total CHOL/HDL Ratio: 3.4 (calc) (ref <5.0)
Triglycerides: 195 mg/dL — ABNORMAL HIGH (ref <150)

## 2021-02-26 LAB — COMPLETE METABOLIC PANEL WITH GFR
AG Ratio: 1.8 (calc) (ref 1.0–2.5)
ALT: 15 U/L (ref 9–46)
AST: 18 U/L (ref 10–35)
Albumin: 4.5 g/dL (ref 3.6–5.1)
Alkaline phosphatase (APISO): 51 U/L (ref 35–144)
BUN: 15 mg/dL (ref 7–25)
CO2: 28 mmol/L (ref 20–32)
Calcium: 9.9 mg/dL (ref 8.6–10.3)
Chloride: 101 mmol/L (ref 98–110)
Creat: 1.02 mg/dL (ref 0.70–1.28)
Globulin: 2.5 g/dL (calc) (ref 1.9–3.7)
Glucose, Bld: 96 mg/dL (ref 65–99)
Potassium: 5.1 mmol/L (ref 3.5–5.3)
Sodium: 139 mmol/L (ref 135–146)
Total Bilirubin: 0.5 mg/dL (ref 0.2–1.2)
Total Protein: 7 g/dL (ref 6.1–8.1)
eGFR: 76 mL/min/{1.73_m2} (ref >=60)

## 2021-02-26 LAB — INSULIN, RANDOM: Insulin: 18.1 u[IU]/mL

## 2021-02-26 LAB — CBC WITH DIFFERENTIAL/PLATELET
Absolute Monocytes: 490 cells/uL (ref 200–950)
Basophils Absolute: 29 cells/uL (ref 0–200)
Basophils Relative: 0.5 %
Eosinophils Absolute: 137 cells/uL (ref 15–500)
Eosinophils Relative: 2.4 %
HCT: 38.9 % (ref 38.5–50.0)
Hemoglobin: 12.9 g/dL — ABNORMAL LOW (ref 13.2–17.1)
Lymphs Abs: 1938 cells/uL (ref 850–3900)
MCH: 34 pg — ABNORMAL HIGH (ref 27.0–33.0)
MCHC: 33.2 g/dL (ref 32.0–36.0)
MCV: 102.6 fL — ABNORMAL HIGH (ref 80.0–100.0)
MPV: 10.2 fL (ref 7.5–12.5)
Monocytes Relative: 8.6 %
Neutro Abs: 3107 cells/uL (ref 1500–7800)
Neutrophils Relative %: 54.5 %
Platelets: 164 10*3/uL (ref 140–400)
RBC: 3.79 10*6/uL — ABNORMAL LOW (ref 4.20–5.80)
RDW: 12.2 % (ref 11.0–15.0)
Total Lymphocyte: 34 %
WBC: 5.7 10*3/uL (ref 3.8–10.8)

## 2021-02-26 LAB — MICROALBUMIN / CREATININE URINE RATIO
Creatinine, Urine: 34 mg/dL (ref 20–320)
Microalb Creat Ratio: 12 mcg/mg creat (ref <30)
Microalb, Ur: 0.4 mg/dL

## 2021-02-26 LAB — URINALYSIS, ROUTINE W REFLEX MICROSCOPIC
Bilirubin Urine: NEGATIVE
Glucose, UA: NEGATIVE
Hgb urine dipstick: NEGATIVE
Ketones, ur: NEGATIVE
Leukocytes,Ua: NEGATIVE
Nitrite: NEGATIVE
Protein, ur: NEGATIVE
Specific Gravity, Urine: 1.006 (ref 1.001–1.035)
pH: 6 (ref 5.0–8.0)

## 2021-02-26 LAB — TSH: TSH: 1.67 mIU/L (ref 0.40–4.50)

## 2021-02-26 LAB — HEMOGLOBIN A1C
Hgb A1c MFr Bld: 6.3 % of total Hgb — ABNORMAL HIGH (ref <5.7)
Mean Plasma Glucose: 134 mg/dL
eAG (mmol/L): 7.4 mmol/L

## 2021-02-26 LAB — PSA: PSA: 0.87 ng/mL (ref <=4.00)

## 2021-02-26 LAB — MAGNESIUM: Magnesium: 1.9 mg/dL (ref 1.5–2.5)

## 2021-02-26 LAB — VITAMIN D 25 HYDROXY (VIT D DEFICIENCY, FRACTURES): Vit D, 25-Hydroxy: 64 ng/mL (ref 30–100)

## 2021-02-26 NOTE — Progress Notes (Signed)
============================================================ -   Test results slightly outside the reference range are not unusual. If there is anything important, I will review this with you,  otherwise it is considered normal test values.  If you have further questions,  please do not hesitate to contact me at the office or via My Chart.  ============================================================ ============================================================  -  CBC is OK  ============================================================ ============================================================  -  Total Chol = 165 -  Excellent   - Very low risk for Heart Attack  / Stroke ============================================================ ============================================================  -  A1c is up a little from 6.0% to now 6.3%, So . . .    - Avoid Sweets, Candy & White Stuff   - White Rice, White [place], White Flour  - Breads &  Pasta ============================================================ ============================================================  -  Vitamin D = 64 - Excellent  ============================================================ ============================================================  -  PSA - Very Low =- Great ! ============================================================ ============================================================  -  All Else - CBC - Kidneys - Electrolytes - Liver - Magnesium & Thyroid    - all  Normal / OK ============================================================ ============================================================  - Keep up the [place] Work  !  ============================================================ ============================================================

## 2021-03-17 ENCOUNTER — Other Ambulatory Visit: Payer: Self-pay

## 2021-03-17 DIAGNOSIS — Z1211 Encounter for screening for malignant neoplasm of colon: Secondary | ICD-10-CM

## 2021-03-17 DIAGNOSIS — Z1212 Encounter for screening for malignant neoplasm of rectum: Secondary | ICD-10-CM | POA: Diagnosis not present

## 2021-03-17 LAB — POC HEMOCCULT BLD/STL (HOME/3-CARD/SCREEN)
Card #2 Fecal Occult Blod, POC: NEGATIVE
Card #3 Fecal Occult Blood, POC: NEGATIVE
Fecal Occult Blood, POC: NEGATIVE

## 2021-03-24 ENCOUNTER — Ambulatory Visit (INDEPENDENT_AMBULATORY_CARE_PROVIDER_SITE_OTHER): Payer: Medicare Other | Admitting: Nurse Practitioner

## 2021-03-24 ENCOUNTER — Encounter: Payer: Self-pay | Admitting: Nurse Practitioner

## 2021-03-24 ENCOUNTER — Other Ambulatory Visit: Payer: Self-pay

## 2021-03-24 VITALS — BP 130/62 | HR 93 | Temp 97.5°F | Wt 178.4 lb

## 2021-03-24 DIAGNOSIS — I25810 Atherosclerosis of coronary artery bypass graft(s) without angina pectoris: Secondary | ICD-10-CM | POA: Diagnosis not present

## 2021-03-24 DIAGNOSIS — I10 Essential (primary) hypertension: Secondary | ICD-10-CM | POA: Diagnosis not present

## 2021-03-24 DIAGNOSIS — K921 Melena: Secondary | ICD-10-CM | POA: Diagnosis not present

## 2021-03-24 NOTE — Patient Instructions (Signed)
Diarrhea, Adult Diarrhea is frequent loose and watery bowel movements. Diarrhea can make you feel weak and cause you to become dehydrated. Dehydration can make you tiredand thirsty, cause you to have a dry mouth, and decrease how often you urinate. Diarrhea typically lasts 2-3 days. However, it can last longer if it is a sign of something more serious. It is important to treat your diarrhea as told byyour health care provider. Follow these instructions at home: Eating and drinking     Follow these recommendations as told by your health care provider: Take an oral rehydration solution (ORS). This is an over-the-counter medicine that helps return your body to its normal balance of nutrients and water. It is found at pharmacies and retail stores. Drink plenty of fluids, such as water, ice chips, diluted fruit juice, and low-calorie sports drinks. You can drink milk also, if desired. Avoid drinking fluids that contain a lot of sugar or caffeine, such as energy drinks, sports drinks, and soda. Eat bland, easy-to-digest foods in small amounts as you are able. These foods include bananas, applesauce, rice, lean meats, toast, and crackers. Avoid alcohol. Avoid spicy or fatty foods.  Medicines Take over-the-counter and prescription medicines only as told by your health care provider. If you were prescribed an antibiotic medicine, take it as told by your health care provider. Do not stop using the antibiotic even if you start to feel better. General instructions  Wash your hands often using soap and water. If soap and water are not available, use a hand sanitizer. Others in the household should wash their hands as well. Hands should be washed: After using the toilet or changing a diaper. Before preparing, cooking, or serving food. While caring for a sick person or while visiting someone in a hospital. Drink enough fluid to keep your urine pale yellow. Rest at home while you recover. Watch your  condition for any changes. Take a warm bath to relieve any burning or pain from frequent diarrhea episodes. Keep all follow-up visits as told by your health care provider. This is important.  Contact a health care provider if: You have a fever. Your diarrhea gets worse. You have new symptoms. You cannot keep fluids down. You feel light-headed or dizzy. You have a headache. You have muscle cramps. Get help right away if: You have chest pain. You feel extremely weak or you faint. You have bloody or black stools or stools that look like tar. You have severe pain, cramping, or bloating in your abdomen. You have trouble breathing or you are breathing very quickly. Your heart is beating very quickly. Your skin feels cold and clammy. You feel confused. You have signs of dehydration, such as: Dark urine, very little urine, or no urine. Cracked lips. Dry mouth. Sunken eyes. Sleepiness. Weakness. Summary Diarrhea is frequent loose and watery bowel movements. Diarrhea can make you feel weak and cause you to become dehydrated. Drink enough fluids to keep your urine pale yellow. Make sure that you wash your hands after using the toilet. If soap and water are not available, use hand sanitizer. Contact a health care provider if your diarrhea gets worse or you have new symptoms. Get help right away if you have signs of dehydration. This information is not intended to replace advice given to you by your health care provider. Make sure you discuss any questions you have with your healthcare provider. Document Revised: 11/06/2018 Document Reviewed: 11/24/2017 Elsevier Patient Education  2022 [employer]  

## 2021-03-24 NOTE — Progress Notes (Signed)
FOLLOW UP  Assessment and Plan:   Joshua Higgins was seen today for acute visit.  Diagnoses and all orders for this visit:  Black stool Hemoccult negative Had been drinking a lot of pepto bismol for diarrhea.  Advised pt this is most likely cause of black stools.  Advised to limit amount of Pepto and recommended Immodium for future episodes of diarrhea  Hypertension Well controlled with current medications  Monitor blood pressure at home; patient to call if consistently greater than 130/80 Continue DASH diet.   Reminder to go to the ER if any CP, SOB, nausea, dizziness, severe HA, changes vision/speech, left arm numbness and tingling and jaw pain.      Continue diet and meds as discussed. Further disposition pending results of labs. Discussed med's effects and SE's.   Over 30 minutes of exam, counseling, chart review, and critical decision making was performed.   Future Appointments  Date Time Provider [place]  05/07/2021  3:20 PM Croitoru, Dani Gobble, MD [facility]  08/24/2021  9:30 AM Magda Bernheim, NP [facility] None  10/22/2021 11:30 AM Unk Pinto, MD [facility] None  02/28/2022  3:00 PM Unk Pinto, MD [facility] None    ----------------------------------------------------------------------------------------------------------------------  HPI Joshua Higgins  presents for black bowel movements x 4 days  He drank pepto bismol x past several days for previous diarrhea  which lasted 3 days from 9/17-9/20 and now is seeing dark black stool.   BMI is Body mass index is 26.35 kg/m., he has been working on diet and exercise. Wt Readings from Last 3 Encounters:  03/24/21 178 lb 6.4 oz (80.9 kg)  02/25/21 173 lb 3.2 oz (78.6 kg)  10/22/20 175 lb (79.4 kg)    His blood pressure has been controlled at home, today their BP is BP: 130/62  He does workout. He denies chest pain, shortness of breath, dizziness.  Patient is on Vitamin D supplement.   Lab Results   Component Value Date   VD25OH 64 02/25/2021        Current Medications:  Current Outpatient Medications on File Prior to Visit  Medication Sig   aspirin EC 81 MG tablet Take 1 tablet (81 mg total) by mouth daily.   atenolol (TENORMIN) 50 MG tablet TAKE 1 TABLET DAILY FOR BP & HEART   Bayer Microlet Lancets lancets CHECK BLOOD SUGAR ONCE A DAY   Blood Glucose Monitoring Suppl DEVI Use kit to check blood sugar once a day   Cholecalciferol 5000 UNITS TABS Take 1 tablet by mouth daily.   Cyanocobalamin (B-12) 1000 MCG SUBL Place 1 tablet under the tongue daily. 2500 mcg   famotidine (PEPCID) 20 MG tablet TAKE 1 TABLET BY MOUTH TWICE A DAY   glucose blood (CONTOUR NEXT TEST) test strip USE TO TEST BLOOD SUGAR ONCE DAILY   Lancets MISC Use to test blood sugar once daily   levothyroxine (SYNTHROID) 50 MCG tablet TAKE 1 TABLET DAILY ON AN EMPTY STOMACH WITH ONLY WATER FOR 30 MINUTES & NO ANTACID MEDS, CALCIUM OR MAGNESIUM FOR 4 HOURS & AVOID BIOTIN   losartan (COZAAR) 25 MG tablet TAKE 1/2 TABLET BY MOUTH EVERY DAY AT BEDTIME   metFORMIN (GLUCOPHAGE-XR) 500 MG 24 hr tablet TAKE 2 TABLETS 2 TIMES PER DAY WITH MEALS FOR DIABETES   Multiple Vitamin (MULTIVITAMIN) tablet Take 1 tablet by mouth at bedtime.    Omega-3 Fatty Acids (FISH OIL) 1000 MG CAPS Take 1,000 mg by mouth at bedtime.    rosuvastatin (CRESTOR) 10 MG  tablet TAKE 1 TABLET 2 X /WEEK FOR CHOLESTEROL   triamcinolone (NASACORT) 55 MCG/ACT AERO nasal inhaler Place 1 spray into the nose daily as needed.   acyclovir (ZOVIRAX) 800 MG tablet Take 1 tablet daily for Fever Blister / Mouth Ulcers (Patient not taking: Reported on 03/24/2021)   No current facility-administered medications on file prior to visit.     Allergies:  Allergies  Allergen Reactions   Gemfibrozil Other (See Comments)    Muscle aches   Ace Inhibitors Cough   Lipitor [Atorvastatin] Other (See Comments)    Joint pain.     Medical History:  Past Medical History:   Diagnosis Date   Anemia    low iron   Arthritis    Cancer ([place])    skin cancer on face   Cataracts, bilateral    Coronary artery disease    CABG - 2008   Diabetes mellitus without complication ([place])    type II   GERD (gastroesophageal reflux disease)    Hepatitis    Hyperlipidemia    Hypertension    pt states he does not have HTN, takes Atenolol for migraines   Hypothyroidism    Migraine headache with aura    takes Atenolol   Sleep apnea    does not use cpap   Family history- Reviewed and unchanged Social history- Reviewed and unchanged   Review of Systems:     Physical Exam: BP 130/62   Pulse 93   Temp (!) 97.5 F (36.4 C)   Wt 178 lb 6.4 oz (80.9 kg)   SpO2 96%   BMI 26.35 kg/m  Wt Readings from Last 3 Encounters:  03/24/21 178 lb 6.4 oz (80.9 kg)  02/25/21 173 lb 3.2 oz (78.6 kg)  10/22/20 175 lb (79.4 kg)   General Appearance: Well nourished, in no apparent distress. Eyes: PERRLA, EOMs, conjunctiva no swelling or erythema Sinuses: No Frontal/maxillary tenderness ENT/Mouth: Ext aud canals clear, TMs without erythema, bulging. No erythema, swelling, or exudate on post pharynx.  Tonsils not swollen or erythematous. Hearing normal.  Neck: Supple, thyroid normal.  Respiratory: Respiratory effort normal, BS equal bilaterally without rales, rhonchi, wheezing or stridor.  Cardio: RRR with no MRGs. Brisk peripheral pulses without edema.  Abdomen: Soft, + BS.  Non tender, no guarding, rebound, hernias, masses. Lymphatics: Non tender without lymphadenopathy.  Musculoskeletal: Full ROM, 5/5 strength, Normal gait Skin: Warm, dry without rashes, lesions, ecchymosis.  Neuro: Cranial nerves intact. No cerebellar symptoms.  Psych: Awake and oriented X 3, normal affect, Insight and Judgment appropriate.  Rectal: no hemorrhoids, Hemoccult negative  Kambre Messner Kathyrn Drown, NP 11:52 AM [facility]

## 2021-04-06 DIAGNOSIS — H5789 Other specified disorders of eye and adnexa: Secondary | ICD-10-CM | POA: Diagnosis not present

## 2021-04-08 DIAGNOSIS — Z23 Encounter for immunization: Secondary | ICD-10-CM | POA: Diagnosis not present

## 2021-05-04 DIAGNOSIS — L570 Actinic keratosis: Secondary | ICD-10-CM | POA: Diagnosis not present

## 2021-05-04 DIAGNOSIS — D485 Neoplasm of uncertain behavior of skin: Secondary | ICD-10-CM | POA: Diagnosis not present

## 2021-05-04 DIAGNOSIS — Z08 Encounter for follow-up examination after completed treatment for malignant neoplasm: Secondary | ICD-10-CM | POA: Diagnosis not present

## 2021-05-04 DIAGNOSIS — Z85828 Personal history of other malignant neoplasm of skin: Secondary | ICD-10-CM | POA: Diagnosis not present

## 2021-05-04 DIAGNOSIS — D1801 Hemangioma of skin and subcutaneous tissue: Secondary | ICD-10-CM | POA: Diagnosis not present

## 2021-05-04 DIAGNOSIS — L821 Other seborrheic keratosis: Secondary | ICD-10-CM | POA: Diagnosis not present

## 2021-05-07 ENCOUNTER — Encounter: Payer: Self-pay | Admitting: Cardiovascular Disease

## 2021-05-07 ENCOUNTER — Other Ambulatory Visit: Payer: Self-pay

## 2021-05-07 ENCOUNTER — Ambulatory Visit (INDEPENDENT_AMBULATORY_CARE_PROVIDER_SITE_OTHER): Payer: Medicare Other | Admitting: Cardiovascular Disease

## 2021-05-07 VITALS — BP 130/78 | HR 68 | Ht 70.0 in | Wt 177.4 lb

## 2021-05-07 DIAGNOSIS — N182 Chronic kidney disease, stage 2 (mild): Secondary | ICD-10-CM

## 2021-05-07 DIAGNOSIS — I10 Essential (primary) hypertension: Secondary | ICD-10-CM

## 2021-05-07 DIAGNOSIS — E1122 Type 2 diabetes mellitus with diabetic chronic kidney disease: Secondary | ICD-10-CM | POA: Diagnosis not present

## 2021-05-07 DIAGNOSIS — E7800 Pure hypercholesterolemia, unspecified: Secondary | ICD-10-CM

## 2021-05-07 DIAGNOSIS — I25810 Atherosclerosis of coronary artery bypass graft(s) without angina pectoris: Secondary | ICD-10-CM

## 2021-05-07 DIAGNOSIS — I700 Atherosclerosis of aorta: Secondary | ICD-10-CM | POA: Diagnosis not present

## 2021-05-07 MED ORDER — EZETIMIBE 10 MG PO TABS: 10.0000 mg | ORAL_TABLET | Freq: Every day | ORAL | 3 refills | Status: DC

## 2021-05-07 NOTE — Progress Notes (Signed)
Cardiology office note    Date:  05/08/2021   ID:  Joshua Higgins, DOB 10-31-1945, MRN [id number]  PCP:  Unk Pinto, MD  Cardiologist:  Joshua Higgins Electrophysiologist:  None   Evaluation Performed:  Follow-Up Visit  Chief Complaint:  CAD f/u  History of Present Illness:    Joshua Higgins is a 75 y.o. male who is now roughly 12 years after coronary bypass surgery (LIMA to LAD, SVG to ramus intermedius and OM, SVG to acute marginal branch of RCA), and also has a history of hyperlipidemia, hypertension, type 2 diabetes mellitus controlled with oral antidiabetics, erectile dysfunction, migraines.  He has developed statin myopathy repeatedly with daily doses of statins, but does tolerate rosuvastatin taken twice weekly.  Of note, he never had angina pectoris and his CAD was detected with a stress test.  His most recent exercise tolerance test was performed almost exactly a year ago in October 2021.  He did not have angina or ischemic changes at target heart rate, exercised 30 seconds less than he did in 2019 and 1 minute less than he did in 2017.  He continues to go for daily walks in the woods with his dog, can perform relatively heavy yard work without complaints of angina or dyspnea.  He "feels great".  He has a history of migraines which are well controlled with atenolol.  He does not have claudication, edema, orthopnea, PND, palpitations, dizziness or syncope.  His sciatica is not bothering him currently, but he has had intermittent problems with right-sided back pain radiating down his leg to his great toe.  He has had 2 previous back surgeries (Dr. Ellene Higgins).  When he was taking ezetimibe, his LDL cholesterol was excellent.  Without it his LDL ranges in the 80s and 90s.  He had some misconception that it interfered with his blood pressure and cause dizziness.   Past Medical History:  Diagnosis Date   Anemia    low iron   Arthritis    Cancer ([place])    skin cancer on face    Cataracts, bilateral    Coronary artery disease    CABG - 2008   Diabetes mellitus without complication (HCC)    type II   GERD (gastroesophageal reflux disease)    Hepatitis    Hyperlipidemia    Hypertension    pt states he does not have HTN, takes Atenolol for migraines   Hypothyroidism    Migraine headache with aura    takes Atenolol   Sleep apnea    does not use cpap   Past Surgical History:  Procedure Laterality Date   BACK SURGERY     CARDIAC SURGERY     Bypass   CATARACT EXTRACTION, BILATERAL Bilateral 2018   Dr. Gershon Higgins   COLONOSCOPY     CORONARY ANGIOPLASTY  2001   CORONARY ARTERY BYPASS GRAFT     ELBOW SURGERY Left    due to damaged nerve   KNEE SURGERY Right    arthroscopic   LUMBAR LAMINECTOMY/DECOMPRESSION MICRODISCECTOMY Right 08/04/2014   Procedure: Right Lumbar four-five Microdiskectomy;  Surgeon: Joshua Miss, MD;  Location: [facility];  Service: Neurosurgery;  Laterality: Right;  Right L4-5 Microdiskectomy   SURGERY SCROTAL / TESTICULAR     as a child/teenager     Current Meds  Medication Sig   acyclovir (ZOVIRAX) 800 MG tablet Take 1 tablet daily for Fever Blister / Mouth Ulcers   aspirin EC 81 MG tablet Take 1 tablet (81 mg total)  by mouth daily.   atenolol (TENORMIN) 50 MG tablet TAKE 1 TABLET DAILY FOR BP & HEART   Bayer Microlet Lancets lancets CHECK BLOOD SUGAR ONCE A DAY   Blood Glucose Monitoring Suppl DEVI Use kit to check blood sugar once a day   Cholecalciferol 5000 UNITS TABS Take 1 tablet by mouth daily.   Cyanocobalamin (B-12) 1000 MCG SUBL Place 1 tablet under the tongue daily. 2500 mcg   ezetimibe (ZETIA) 10 MG tablet Take 1 tablet (10 mg total) by mouth daily.   famotidine (PEPCID) 20 MG tablet TAKE 1 TABLET BY MOUTH TWICE A DAY   glucose blood (CONTOUR NEXT TEST) test strip USE TO TEST BLOOD SUGAR ONCE DAILY   Lancets MISC Use to test blood sugar once daily   levothyroxine (SYNTHROID) 50 MCG tablet TAKE 1 TABLET DAILY ON AN EMPTY  STOMACH WITH ONLY WATER FOR 30 MINUTES & NO ANTACID MEDS, CALCIUM OR MAGNESIUM FOR 4 HOURS & AVOID BIOTIN   losartan (COZAAR) 25 MG tablet TAKE 1/2 TABLET BY MOUTH EVERY DAY AT BEDTIME   metFORMIN (GLUCOPHAGE-XR) 500 MG 24 hr tablet TAKE 2 TABLETS 2 TIMES PER DAY WITH MEALS FOR DIABETES   Multiple Vitamin (MULTIVITAMIN) tablet Take 1 tablet by mouth at bedtime.    Omega-3 Fatty Acids (FISH OIL) 1000 MG CAPS Take 1,000 mg by mouth at bedtime.    rosuvastatin (CRESTOR) 10 MG tablet TAKE 1 TABLET 2 X /WEEK FOR CHOLESTEROL   triamcinolone (NASACORT) 55 MCG/ACT AERO nasal inhaler Place 1 spray into the nose daily as needed.     Allergies:   Gemfibrozil, Ace inhibitors, and Lipitor [atorvastatin]   Social History   Tobacco Use   Smoking status: Former    Types: Cigarettes    Quit date: 10/01/1973    Years since quitting: 47.6   Smokeless tobacco: Never  Substance Use Topics   Alcohol use: No   Drug use: No     Family Hx: The patient's family history includes Arthritis in his mother; CAD in his brother and sister; Diabetes in his maternal grandmother; Heart attack in his brother and father; Hypertension in his brother; Ulcers in his mother.  ROS:   Please see the history of present illness.   All other systems are reviewed and are negative.   Prior CV studies:   The following studies were reviewed today:  Notes and labs from Dr. Melford Higgins  Labs/Other Tests and Data Reviewed:    EKG: Not ordered today.  EKG from 12/24/2019 shows sinus rhythm with left axis deviation and no evidence of any repolarization abnormalities.  Recent Labs: 02/25/2021: ALT 15; BUN 15; Creat 1.02; Hemoglobin 12.9; Magnesium 1.9; Platelets 164; Potassium 5.1; Sodium 139; TSH 1.67   Lipid Panel     Component Value Date/Time   CHOL 165 02/25/2021 1504   TRIG 195 (H) 02/25/2021 1504   HDL 48 02/25/2021 1504   CHOLHDL 3.4 02/25/2021 1504   VLDL 25 02/14/2017 1214   LDLCALC 88 02/25/2021 1504     Recent  Lipid Panel Lab Results  Component Value Date/Time   CHOL 165 02/25/2021 03:04 PM   TRIG 195 (H) 02/25/2021 03:04 PM   HDL 48 02/25/2021 03:04 PM   CHOLHDL 3.4 02/25/2021 03:04 PM   LDLCALC 88 02/25/2021 03:04 PM    Wt Readings from Last 3 Encounters:  05/07/21 177 lb 6.4 oz (80.5 kg)  03/24/21 178 lb 6.4 oz (80.9 kg)  02/25/21 173 lb 3.2 oz (78.6 kg)  Objective:    Vital Signs:  BP 130/78   Pulse 68   Ht $R'5\' 10"'WW$  (1.778 m)   Wt 177 lb 6.4 oz (80.5 kg)   SpO2 97%   BMI 25.45 kg/m     General: Alert, oriented x3, no distress, appears fit, younger than stated age. Head: no evidence of trauma, PERRL, EOMI, no exophtalmos or lid lag, no myxedema, no xanthelasma; normal ears, nose and oropharynx Neck: normal jugular venous pulsations and no hepatojugular reflux; brisk carotid pulses without delay and no carotid bruits Chest: clear to auscultation, no signs of consolidation by percussion or palpation, normal fremitus, symmetrical and full respiratory excursions Cardiovascular: normal position and quality of the apical impulse, regular rhythm, normal first and second heart sounds, no murmurs, rubs or gallops Abdomen: no tenderness or distention, no masses by palpation, no abnormal pulsatility or arterial bruits, normal bowel sounds, no hepatosplenomegaly Extremities: no clubbing, cyanosis or edema; 2+ radial, ulnar and brachial pulses bilaterally; 2+ right femoral, posterior tibial and dorsalis pedis pulses; 2+ left femoral, posterior tibial and dorsalis pedis pulses; no subclavian or femoral bruits Neurological: grossly nonfocal Psych: Normal mood and affect     ASSESSMENT & PLAN:    1. Coronary artery disease involving coronary bypass graft of native heart without angina pectoris   2. Essential hypertension   3. Hypercholesterolemia   4. Type 2 diabetes mellitus with stage 2 chronic kidney disease, without long-term current use of insulin ([place])   5. Atherosclerosis of  abdominal aorta (HCC)       CAD s/p CABG: Asymptomatic despite a remarkablly active lifestyle for his age.  We will plan to continue monitoring with treadmill stress test every other year.  He did not have angina pectoris before his diagnosis of CAD that led to CABG. HTN: Excellent control. DM: Healthy weight.  Active lifestyle.  Most recent hemoglobin A1c 6.3%. HLP: I have recommended that he restart the ezetimibe 10 mg daily and continue taking the rosuvastatin twice weekly (Sunday and Wednesday). Atherosclerosis of the abdominal aorta: Asymptomatic.  Normal caliber aorta.  Patient Instructions  Medication Instructions:  START the Ezetimibe (Zetia) 10 mg once daily  *If you need a refill on your cardiac medications before your next appointment, please call your pharmacy*   Lab Work: None ordered If you have labs (blood work) drawn today and your tests are completely normal, you will receive your results only by: [place] (if you have MyChart) OR A paper copy in the mail If you have any lab test that is abnormal or we need to change your treatment, we will call you to review the results.   Testing/Procedures: None ordered   Follow-Up: At [facility], you and your health needs are our priority.  As part of our continuing mission to provide you with exceptional heart care, we have created designated Provider Care Teams.  These Care Teams include your primary Cardiologist (physician) and Advanced Practice Providers (APPs -  Physician Assistants and Nurse Practitioners) who all work together to provide you with the care you need, when you need it.  We recommend signing up for the patient portal called "MyChart".  Sign up information is provided on this After Visit Summary.  MyChart is used to connect with patients for Virtual Visits (Telemedicine).  Patients are able to view lab/test results, encounter notes, upcoming appointments, etc.  Non-urgent messages can be sent to your  provider as well.   To learn more about what you can do with MyChart,  go to [email or web address].    Your next appointment:   12 month(s)  The format for your next appointment:   In Person  Provider:   Sanda Klein, MD       Signed, Sanda Klein, MD  05/08/2021 4:55 PM    [place]

## 2021-05-07 NOTE — Patient Instructions (Signed)
Medication Instructions:  START the Ezetimibe (Zetia) 10 mg once daily  *If you need a refill on your cardiac medications before your next appointment, please call your pharmacy*   Lab Work: None ordered If you have labs (blood work) drawn today and your tests are completely normal, you will receive your results only by: [place] (if you have MyChart) OR A paper copy in the mail If you have any lab test that is abnormal or we need to change your treatment, we will call you to review the results.   Testing/Procedures: None ordered   Follow-Up: At [facility], you and your health needs are our priority.  As part of our continuing mission to provide you with exceptional heart care, we have created designated Provider Care Teams.  These Care Teams include your primary Cardiologist (physician) and Advanced Practice Providers (APPs -  Physician Assistants and Nurse Practitioners) who all work together to provide you with the care you need, when you need it.  We recommend signing up for the patient portal called "MyChart".  Sign up information is provided on this After Visit Summary.  MyChart is used to connect with patients for Virtual Visits (Telemedicine).  Patients are able to view lab/test results, encounter notes, upcoming appointments, etc.  Non-urgent messages can be sent to your provider as well.   To learn more about what you can do with MyChart, go to [email or web address].    Your next appointment:   12 month(s)  The format for your next appointment:   In Person  Provider:   Sanda Klein, MD

## 2021-05-10 DIAGNOSIS — E119 Type 2 diabetes mellitus without complications: Secondary | ICD-10-CM | POA: Diagnosis not present

## 2021-05-10 DIAGNOSIS — Z961 Presence of intraocular lens: Secondary | ICD-10-CM | POA: Diagnosis not present

## 2021-05-10 LAB — HM DIABETES EYE EXAM

## 2021-05-12 ENCOUNTER — Other Ambulatory Visit: Payer: Self-pay | Admitting: Adult Health

## 2021-05-13 DIAGNOSIS — Z23 Encounter for immunization: Secondary | ICD-10-CM | POA: Diagnosis not present

## 2021-05-25 NOTE — Progress Notes (Signed)
Assessment and Plan:  Joshua Higgins was seen today for acute visit.  Diagnoses and all orders for this visit:  Paresthesia of both lower extremities Intermittent, very mild, slightly progressive over a few years His main concern is risk of amputation Diabetes well controlled in prediabetes range, B12 def is corrected Normal thyroid Could have some element of lumbar hx contributing, but most likely a very mild diabetic neuropathy  Discussed option of NCV, also would be reasonable to do ABI He will consider, but is reassured by good neuro exam today and declines at this time; wouldn't pursue neuro surgery follow up Unlikely these tests should significantly change plan or recommendations at this point Declines meds for rare intermittent burning pain Dicussed lifestyle modifications; aggressive control of sugar, antiinflammatory diet, exercise for circulation Continue metformin, statin, bASA Check feet daily and present for evaluation if needed or if he would like to proceed with any of the above discussed possible tests.  He is appreciative and declines further needs at this time.    Further disposition pending results of labs. Discussed med's effects and SE's.   Over 30 minutes of exam, counseling, chart review, and critical decision making was performed.   Future Appointments  Date Time Provider [place]  08/24/2021  9:30 AM Magda Bernheim, NP [facility] None  10/22/2021 11:30 AM Unk Pinto, MD [facility] None  02/28/2022  3:00 PM Unk Pinto, MD [facility] None    ------------------------------------------------------------------------------------------------------------------   HPI BP 138/68   Pulse 70   Temp (!) 97.3 F (36.3 C)   Wt 176 lb 12.8 oz (80.2 kg)   SpO2 96%   BMI 25.37 kg/m  75 y.o.male with T2DM since 2000, hypothyroid on levothyroxine, B12 def corrected with supplement presents for evaluation for intermittent paresthesias.   He reports  intermittent tingling/burning began in bilateral toes a few years back, L>R, has progressed some over the past few years, may have up into ankles. He reports sx are rare, intermittent. Typically only in the evening. Sleeps well without issue. He is not concerned about pain, just saw a commercial about diabetes foot amputations and was concerned. He reports he checks feet daily and no wounds.   He does have hx of lumbar pain - lumbar laminectomy disc microdiscectomy R 08/04/2014 by Dr. Ellene Route, only has mild intermittent lumbar discomfort after riding mower, occasionally will have R buttock tingling but resolves quickly.   He is T2DM, well controlled. He reports fasting of 125 this am, but typically fasting is 110-115. Takes metformin 2000 mg daily.  He is on rosuvastatin 10 mg daily, 81 mg ASA. Eating mostly vegetables, salads.  Lab Results  Component Value Date   HGBA1C 6.3 (H) 02/25/2021   Lab Results  Component Value Date   VITAMINB12 >2,000 (H) 10/22/2020   Lab Results  Component Value Date   TSH 1.67 02/25/2021     Past Medical History:  Diagnosis Date   Anemia    low iron   Arthritis    Cancer ([place])    skin cancer on face   Cataracts, bilateral    Coronary artery disease    CABG - 2008   Diabetes mellitus without complication ([place])    type II   GERD (gastroesophageal reflux disease)    Hepatitis    Hyperlipidemia    Hypertension    pt states he does not have HTN, takes Atenolol for migraines   Hypothyroidism    Migraine headache with aura    takes Atenolol   Sleep  apnea    does not use cpap     Allergies  Allergen Reactions   Gemfibrozil Other (See Comments)    Muscle aches   Ace Inhibitors Cough   Lipitor [Atorvastatin] Other (See Comments)    Joint pain.    Current Outpatient Medications on File Prior to Visit  Medication Sig   acyclovir (ZOVIRAX) 800 MG tablet Take 1 tablet daily for Fever Blister / Mouth Ulcers   aspirin EC 81 MG tablet Take 1 tablet (81  mg total) by mouth daily.   atenolol (TENORMIN) 50 MG tablet TAKE 1 TABLET DAILY FOR BP & HEART   Bayer Microlet Lancets lancets CHECK BLOOD SUGAR ONCE A DAY   Blood Glucose Monitoring Suppl DEVI Use kit to check blood sugar once a day   Cholecalciferol 5000 UNITS TABS Take 1 tablet by mouth daily.   Cyanocobalamin (B-12) 1000 MCG SUBL Place 1 tablet under the tongue daily. 2500 mcg   ezetimibe (ZETIA) 10 MG tablet Take 1 tablet (10 mg total) by mouth daily.   famotidine (PEPCID) 20 MG tablet TAKE 1 TABLET BY MOUTH TWICE A DAY   glucose blood (CONTOUR NEXT TEST) test strip USE TO TEST BLOOD SUGAR ONCE DAILY   Lancets MISC Use to test blood sugar once daily   levothyroxine (SYNTHROID) 50 MCG tablet TAKE 1 TABLET DAILY ON AN EMPTY STOMACH WITH ONLY WATER FOR 30 MINUTES & NO ANTACID MEDS, CALCIUM OR MAGNESIUM FOR 4 HOURS & AVOID BIOTIN   losartan (COZAAR) 25 MG tablet TAKE 1/2 TABLET BY MOUTH EVERY DAY AT BEDTIME   metFORMIN (GLUCOPHAGE-XR) 500 MG 24 hr tablet TAKE 2 TABLETS 2 TIMES PER DAY WITH MEALS FOR DIABETES   Multiple Vitamin (MULTIVITAMIN) tablet Take 1 tablet by mouth at bedtime.    Omega-3 Fatty Acids (FISH OIL) 1000 MG CAPS Take 1,000 mg by mouth at bedtime.    rosuvastatin (CRESTOR) 10 MG tablet TAKE 1 TABLET 2 X /WEEK FOR CHOLESTEROL   triamcinolone (NASACORT) 55 MCG/ACT AERO nasal inhaler Place 1 spray into the nose daily as needed.   No current facility-administered medications on file prior to visit.    ROS: all negative except above.   Physical Exam:  BP 138/68   Pulse 70   Temp (!) 97.3 F (36.3 C)   Wt 176 lb 12.8 oz (80.2 kg)   SpO2 96%   BMI 25.37 kg/m   General Appearance: Well nourished, in no apparent distress. Eyes: PERRLA, conjunctiva no swelling or erythema ENT/Mouth: mask in place; Hearing normal.  Neck: Supple, thyroid normal.  Respiratory: Respiratory effort normal, BS equal bilaterally without rales, rhonchi, wheezing or stridor.  Cardio: RRR with no  MRGs. Brisk peripheral pulses without edema. Cap refill slightly prolonged in toes, cool feet today.  Musculoskeletal: no obvious deformity; normal gait.  Skin: Warm, dry without rashes, lesions, ecchymosis. No thickening or wounds to bil feet.  Neuro: Normal muscle tone; bilateral feet and lower legs with sensation intact throughout to monofilament.  Psych: Awake and oriented X 3, normal affect, Insight and Judgment appropriate.    Izora Ribas, NP 11:13 AM [facility]

## 2021-05-26 ENCOUNTER — Ambulatory Visit (INDEPENDENT_AMBULATORY_CARE_PROVIDER_SITE_OTHER): Payer: Medicare Other | Admitting: Adult Health

## 2021-05-26 ENCOUNTER — Encounter: Payer: Self-pay | Admitting: Adult Health

## 2021-05-26 ENCOUNTER — Other Ambulatory Visit: Payer: Self-pay

## 2021-05-26 VITALS — BP 138/68 | HR 70 | Temp 97.3°F | Wt 176.8 lb

## 2021-05-26 DIAGNOSIS — I25810 Atherosclerosis of coronary artery bypass graft(s) without angina pectoris: Secondary | ICD-10-CM

## 2021-05-26 DIAGNOSIS — R202 Paresthesia of skin: Secondary | ICD-10-CM

## 2021-05-26 NOTE — Patient Instructions (Signed)
Diabetes Mellitus and Foot Care Foot care is an important part of your health, especially when you have diabetes. Diabetes may cause you to have problems because of poor blood flow (circulation) to your feet and legs, which can cause your skin to: Become thinner and drier. Break more easily. Heal more slowly. Peel and crack. You may also have nerve damage (neuropathy) in your legs and feet, causing decreased feeling in them. This means that you may not notice minor injuries to your feet that could lead to more serious problems. Noticing and addressing any potential problems early is the best way to prevent future foot problems. How to care for your feet Foot hygiene  Wash your feet daily with warm water and mild soap. Do not use hot water. Then, pat your feet and the areas between your toes until they are completely dry. Do not soak your feet as this can dry your skin. Trim your toenails straight across. Do not dig under them or around the cuticle. File the edges of your nails with an emery board or nail file. Apply a moisturizing lotion or petroleum jelly to the skin on your feet and to dry, brittle toenails. Use lotion that does not contain alcohol and is unscented. Do not apply lotion between your toes. Shoes and socks Wear clean socks or stockings every day. Make sure they are not too tight. Do not wear knee-high stockings since they may decrease blood flow to your legs. Wear shoes that fit properly and have enough cushioning. Always look in your shoes before you put them on to be sure there are no objects inside. To break in new shoes, wear them for just a few hours a day. This prevents injuries on your feet. Wounds, scrapes, corns, and calluses  Check your feet daily for blisters, cuts, bruises, sores, and redness. If you cannot see the bottom of your feet, use a mirror or ask someone for help. Do not cut corns or calluses or try to remove them with medicine. If you find a minor scrape,  cut, or break in the skin on your feet, keep it and the skin around it clean and dry. You may clean these areas with mild soap and water. Do not clean the area with peroxide, alcohol, or iodine. If you have a wound, scrape, corn, or callus on your foot, look at it several times a day to make sure it is healing and not infected. Check for: Redness, swelling, or pain. Fluid or blood. Warmth. Pus or a bad smell. General tips Do not cross your legs. This may decrease blood flow to your feet. Do not use heating pads or hot water bottles on your feet. They may burn your skin. If you have lost feeling in your feet or legs, you may not know this is happening until it is too late. Protect your feet from hot and cold by wearing shoes, such as at the beach or on hot pavement. Schedule a complete foot exam at least once a year (annually) or more often if you have foot problems. Report any cuts, sores, or bruises to your health care provider immediately. Where to find more information American Diabetes Association: www.diabetes.org Association of Diabetes Care & Education Specialists: www.diabeteseducator.org Contact a health care provider if: You have a medical condition that increases your risk of infection and you have any cuts, sores, or bruises on your feet. You have an injury that is not healing. You have redness on your legs or feet. You   feel burning or tingling in your legs or feet. You have pain or cramps in your legs and feet. Your legs or feet are numb. Your feet always feel cold. You have pain around any toenails. Get help right away if: You have a wound, scrape, corn, or callus on your foot and: You have pain, swelling, or redness that gets worse. You have fluid or blood coming from the wound, scrape, corn, or callus. Your wound, scrape, corn, or callus feels warm to the touch. You have pus or a bad smell coming from the wound, scrape, corn, or callus. You have a fever. You have a red  line going up your leg. Summary Check your feet every day for blisters, cuts, bruises, sores, and redness. Apply a moisturizing lotion or petroleum jelly to the skin on your feet and to dry, brittle toenails. Wear shoes that fit properly and have enough cushioning. If you have foot problems, report any cuts, sores, or bruises to your health care provider immediately. Schedule a complete foot exam at least once a year (annually) or more often if you have foot problems. This information is not intended to replace advice given to you by your health care provider. Make sure you discuss any questions you have with your health care provider. Document Revised: 01/09/2020 Document Reviewed: 01/09/2020 Elsevier Patient Education  2022 [employer].     Peripheral Neuropathy Peripheral neuropathy is a type of nerve damage. It affects nerves that carry signals between the spinal cord and the arms, legs, and the rest of the body (peripheral nerves). It does not affect nerves in the spinal cord or brain. In peripheral neuropathy, one nerve or a group of nerves may be damaged. Peripheral neuropathy is a broad category that includes many specific nerve disorders, like diabetic neuropathy, hereditary neuropathy, and carpal tunnel syndrome. What are the causes? This condition may be caused by: Diabetes. This is the most common cause of peripheral neuropathy. Nerve injury. Pressure or stress on a nerve that lasts a long time. Lack (deficiency) of B vitamins. This can result from alcoholism, poor diet, or a restricted diet. Infections. Autoimmune diseases, such as rheumatoid arthritis and systemic lupus erythematosus. Nerve diseases that are passed from parent to child (inherited). Some medicines, such as cancer medicines (chemotherapy). Poisonous (toxic) substances, such as lead and mercury. Too little blood flowing to the legs. Kidney disease. Thyroid disease. In some cases, the cause of this condition  is not known. What are the signs or symptoms? Symptoms of this condition depend on which of your nerves is damaged. Common symptoms include: Loss of feeling (numbness) in the feet, hands, or both. Tingling in the feet, hands, or both. Burning pain. Very sensitive skin. Weakness. Not being able to move a part of the body (paralysis). Muscle twitching. Clumsiness or poor coordination. Loss of balance. Not being able to control your bladder. Feeling dizzy. Sexual problems. How is this diagnosed? Diagnosing and finding the cause of peripheral neuropathy can be difficult. Your health care provider will take your medical history and do a physical exam. A neurological exam will also be done. This involves checking things that are affected by your brain, spinal cord, and nerves (nervous system). For example, your health care provider will check your reflexes, how you move, and what you can feel. You may have other tests, such as: Blood tests. Electromyogram (EMG) and nerve conduction tests. These tests check nerve function and how well the nerves are controlling the muscles. Imaging tests, such as  CT scans or MRI to rule out other causes of your symptoms. Removing a small piece of nerve to be examined in a lab (nerve biopsy). Removing and examining a small amount of the fluid that surrounds the brain and spinal cord (lumbar puncture). How is this treated? Treatment for this condition may involve: Treating the underlying cause of the neuropathy, such as diabetes, kidney disease, or vitamin deficiencies. Stopping medicines that can cause neuropathy, such as chemotherapy. Medicine to help relieve pain. Medicines may include: Prescription or over-the-counter pain medicine. Antiseizure medicine. Antidepressants. Pain-relieving patches that are applied to painful areas of skin. Surgery to relieve pressure on a nerve or to destroy a nerve that is causing pain. Physical therapy to help improve  movement and balance. Devices to help you move around (assistive devices). Follow these instructions at home: Medicines Take over-the-counter and prescription medicines only as told by your health care provider. Do not take any other medicines without first asking your health care provider. Do not drive or use heavy machinery while taking prescription pain medicine. Lifestyle  Do not use any products that contain nicotine or tobacco, such as cigarettes and e-cigarettes. Smoking keeps blood from reaching damaged nerves. If you need help quitting, ask your health care provider. Avoid or limit alcohol. Too much alcohol can cause a vitamin B deficiency, and vitamin B is needed for healthy nerves. Eat a healthy diet. This includes: Eating foods that are high in fiber, such as fresh fruits and vegetables, whole grains, and beans. Limiting foods that are high in fat and processed sugars, such as fried or sweet foods. General instructions  If you have diabetes, work closely with your health care provider to keep your blood sugar under control. If you have numbness in your feet: Check every day for signs of injury or infection. Watch for redness, warmth, and swelling. Wear padded socks and comfortable shoes. These help protect your feet. Develop a good support system. Living with peripheral neuropathy can be stressful. Consider talking with a mental health specialist or joining a support group. Use assistive devices and attend physical therapy as told by your health care provider. This may include using a walker or a cane. Keep all follow-up visits as told by your health care provider. This is important. Contact a health care provider if: You have new signs or symptoms of peripheral neuropathy. You are struggling emotionally from dealing with peripheral neuropathy. Your pain is not well-controlled. Get help right away if: You have an injury or infection that is not healing normally. You develop  new weakness in an arm or leg. You have fallen or do so frequently. Summary Peripheral neuropathy is when the nerves in the arms, or legs are damaged, resulting in numbness, weakness, or pain. There are many causes of peripheral neuropathy, including diabetes, pinched nerves, vitamin deficiencies, autoimmune disease, and hereditary conditions. Diagnosing and finding the cause of peripheral neuropathy can be difficult. Your health care provider will take your medical history, do a physical exam, and do tests, including blood tests and nerve function tests. Treatment involves treating the underlying cause of the neuropathy and taking medicines to help control pain. Physical therapy and assistive devices may also help. This information is not intended to replace advice given to you by your health care provider. Make sure you discuss any questions you have with your health care provider. Document Revised: 03/31/2020 Document Reviewed: 03/31/2020 Elsevier Patient Education  2022 [employer].

## 2021-06-09 ENCOUNTER — Encounter: Payer: Self-pay | Admitting: Internal Medicine

## 2021-07-06 DIAGNOSIS — L280 Lichen simplex chronicus: Secondary | ICD-10-CM | POA: Diagnosis not present

## 2021-07-26 ENCOUNTER — Other Ambulatory Visit: Payer: Self-pay | Admitting: Adult Health

## 2021-07-28 ENCOUNTER — Other Ambulatory Visit: Payer: Self-pay

## 2021-07-28 ENCOUNTER — Telehealth: Payer: Self-pay | Admitting: Cardiovascular Disease

## 2021-07-28 MED ORDER — EZETIMIBE 10 MG PO TABS: 10.0000 mg | ORAL_TABLET | Freq: Every day | ORAL | 3 refills | Status: DC

## 2021-07-28 NOTE — Telephone Encounter (Signed)
°*  STAT* If patient is at the pharmacy, call can be transferred to refill team.   1. Which medications need to be refilled? (please list name of each medication and dose if known)  ezetimibe (ZETIA) 10 MG tablet  2. Which pharmacy/location (including street and city if local pharmacy) is medication to be sent to? Patient is requesting a written Rx so that he can pick it up from the office and take it to the pharmacy of his choice. He states it will be cheaper to do it this way.  3. Do they need a 30 day or 90 day supply?   90 day supply

## 2021-07-28 NOTE — Telephone Encounter (Signed)
Medication has been filled 

## 2021-07-29 MED ORDER — EZETIMIBE 10 MG PO TABS: 10.0000 mg | ORAL_TABLET | Freq: Every day | ORAL | 3 refills | Status: DC

## 2021-07-29 NOTE — Telephone Encounter (Signed)
Follow Up:     Patient said his refill was sent to the wrong pharmacy. He wants to pick up a written prescription or please call it to [facility] on [address]  please. It is less expensive there. Please let him know which one you are going to do.

## 2021-07-29 NOTE — Telephone Encounter (Signed)
Refills has been sent to the correct pharmacy.

## 2021-08-03 DIAGNOSIS — L280 Lichen simplex chronicus: Secondary | ICD-10-CM | POA: Diagnosis not present

## 2021-08-03 DIAGNOSIS — C44222 Squamous cell carcinoma of skin of right ear and external auricular canal: Secondary | ICD-10-CM | POA: Diagnosis not present

## 2021-08-03 DIAGNOSIS — D485 Neoplasm of uncertain behavior of skin: Secondary | ICD-10-CM | POA: Diagnosis not present

## 2021-08-03 DIAGNOSIS — L570 Actinic keratosis: Secondary | ICD-10-CM | POA: Diagnosis not present

## 2021-08-16 ENCOUNTER — Other Ambulatory Visit: Payer: Self-pay

## 2021-08-16 MED ORDER — FAMOTIDINE 20 MG PO TABS: 20.0000 mg | ORAL_TABLET | Freq: Two times a day (BID) | ORAL | 1 refills | Status: DC

## 2021-08-23 NOTE — Progress Notes (Signed)
FOLLOW UP  Assessment and Plan:   Essential hypertension Well controlled; continue medication Monitor blood pressure at home; call if consistently over 130/80 Continue DASH diet.   Reminder to go to the ER if any CP, SOB, nausea, dizziness, severe HA, changes vision/speech, left arm numbness and tingling and jaw pain. - CBC  Coronary artery disease involving coronary bypass graft of native heart, Other angina (HCC) very infrequent CCS class II exertional angina pectoris; continue BB; followed by cardiology (Dr. Sallyanne Kuster) Unremarkable CTA in 02/2019 Continue ASA, close management of cholesterol   Type 2 diabetes mellitus with stage 2 chronic kidney disease, without long-term current use of insulin (HCC) Continue metformin Continue diet and exercise.  Perform daily foot/skin check, notify office of any concerning changes.  -     Hemoglobin A1c - CMP  Hypothyroidism, unspecified type Continue synthroid reminded to take on an empty stomach 30-49mins before food.  -     TSH  Mixed hyperlipidemia associated with Type 2 DM(HCC) Tolerating low dose rosuvastatin; LDL goal <70 Continue low cholesterol diet and exercise.  -     Lipid panel  Vitamin D deficiency Continue supplementation At goal at last visit; defer checking level  Macrocytic anemia/B12 deficiency Never started on sublingual B12; discussed adding 1000 mcg daily  - Vitamin B12     Continue diet and meds as discussed. Further disposition pending results of labs. Discussed med's effects and SE's.   Over 30 minutes of exam, counseling, chart review, and critical decision making was performed.   Future Appointments  Date Time Provider [place]  10/22/2021 11:30 AM Unk Pinto, MD [facility] None  02/28/2022  3:00 PM Unk Pinto, MD [facility] None    ----------------------------------------------------------------------------------------------------------------------  HPI 76 y.o. male   presents for 3 month follow up on hypertension, cholesterol, diabetes, weight and vitamin D deficiency.   He continues to have dizziness and has been present for 50-60 years. Has used medication in the past and did not relieve the dizziness and has also been to ENT and cold not find any abnormalities.  BMI is Body mass index is 25.14 kg/m., he has been working on diet and reports he is active throughout the day, doesn't sit much, but not intentionally exercising.  Wt Readings from Last 3 Encounters:  08/24/21 175 lb 3.2 oz (79.5 kg)  05/26/21 176 lb 12.8 oz (80.2 kg)  05/07/21 177 lb 6.4 oz (80.5 kg)   Patient has ASCAD with stenting in 2001, CABG 2008 with very infrequent CCS class II exertional angina pectoris and followed by Dr. Sallyanne Kuster Aortic atherosclerosis per CXR in 2017. ECHO 02/2019 showed LVEF 60-65%.   His blood pressure has been controlled at home (115-130/60-70 at home), today their BP is BP: 110/60 BP Readings from Last 3 Encounters:  08/24/21 110/60  05/26/21 138/68  05/07/21 130/78     He does not workout though very active around his property. He denies chest pain, shortness of breath, dizziness.    He is on cholesterol medication (rosuvastatin 20 mg twice weekly, hx of myalgias with daily dosing) and Zetia daily and denies myalgias. His cholesterol is at goal. The cholesterol last visit was:   Lab Results  Component Value Date   CHOL 165 02/25/2021   HDL 48 02/25/2021   LDLCALC 88 02/25/2021   TRIG 195 (H) 02/25/2021   CHOLHDL 3.4 02/25/2021    He has been working on diet for T2 diabetes on meformin (2000 mg daily), and denies foot ulcerations, increased appetite, nausea,  paresthesia of the feet, polydipsia, polyuria, visual disturbances, vomiting and weight loss. He does check fasting sugars, recently consistently running in 110-115. Last A1C in the office was:  Lab Results  Component Value Date   HGBA1C 6.3 (H) 02/25/2021   CKD II associated with T2DM on  losartan Lab Results  Component Value Date   GFRNONAA 72 10/22/2020   He is on thyroid medication. His medication was not changed last visit.   Lab Results  Component Value Date   TSH 1.67 02/25/2021   Patient is on Vitamin D supplement.   Lab Results  Component Value Date   VD25OH 64 02/25/2021     He has persistent anemia worse at last check; macrocytic/hyperchromic, had normal iron, folate on 03/12/2019, B12 was low and recommended sublingual B12 last year but admits forgot and never started Lab Results  Component Value Date   WBC 5.7 02/25/2021   HGB 12.9 (L) 02/25/2021   HCT 38.9 02/25/2021   MCV 102.6 (H) 02/25/2021   PLT 164 02/25/2021   Lab Results  Component Value Date   VITAMINB12 >2,000 (H) 10/22/2020    Current Medications:  Current Outpatient Medications on File Prior to Visit  Medication Sig   aspirin EC 81 MG tablet Take 1 tablet (81 mg total) by mouth daily.   atenolol (TENORMIN) 50 MG tablet TAKE 1 TABLET DAILY FOR BP & HEART   Cholecalciferol 5000 UNITS TABS Take 1 tablet by mouth daily.   Cyanocobalamin (B-12) 1000 MCG SUBL Place 1 tablet under the tongue daily. 2500 mcg  every other day   ezetimibe (ZETIA) 10 MG tablet Take 1 tablet (10 mg total) by mouth daily.   famotidine (PEPCID) 20 MG tablet Take 1 tablet (20 mg total) by mouth 2 (two) times daily.   levothyroxine (SYNTHROID) 50 MCG tablet TAKE 1 TABLET BY MOUTH DAILY ON AN EMPTY STOMACH WITH ONLY WATER FOR 30 MINUTES & NO ANTACID MEDS, CALCIUM OR MAGNESIUM FOR 4 HOURS & AVOID BIOTIN   losartan (COZAAR) 25 MG tablet TAKE 1/2 TABLET BY MOUTH EVERY DAY AT BEDTIME   metFORMIN (GLUCOPHAGE-XR) 500 MG 24 hr tablet TAKE 2 TABLETS 2 TIMES PER DAY WITH MEALS FOR DIABETES   Multiple Vitamin (MULTIVITAMIN) tablet Take 1 tablet by mouth at bedtime.    Omega-3 Fatty Acids (FISH OIL) 1000 MG CAPS Take 1,000 mg by mouth at bedtime.    rosuvastatin (CRESTOR) 10 MG tablet TAKE 1 TABLET 2 X /WEEK FOR CHOLESTEROL    triamcinolone (NASACORT) 55 MCG/ACT AERO nasal inhaler Place 1 spray into the nose daily as needed.   acyclovir (ZOVIRAX) 800 MG tablet Take 1 tablet daily for Fever Blister / Mouth Ulcers   Bayer Microlet Lancets lancets CHECK BLOOD SUGAR ONCE A DAY   Blood Glucose Monitoring Suppl DEVI Use kit to check blood sugar once a day   glucose blood (CONTOUR NEXT TEST) test strip USE TO TEST BLOOD SUGAR ONCE DAILY   Lancets MISC Use to test blood sugar once daily   No current facility-administered medications on file prior to visit.     Allergies:  Allergies  Allergen Reactions   Gemfibrozil Other (See Comments)    Muscle aches   Ace Inhibitors Cough   Lipitor [Atorvastatin] Other (See Comments)    Joint pain.     Medical History:  Past Medical History:  Diagnosis Date   Anemia    low iron   Arthritis    Cancer ([place])    skin cancer on  face   Cataracts, bilateral    Coronary artery disease    CABG - 2008   Diabetes mellitus without complication ([place])    type II   GERD (gastroesophageal reflux disease)    Hepatitis    Hyperlipidemia    Hypertension    pt states he does not have HTN, takes Atenolol for migraines   Hypothyroidism    Migraine headache with aura    takes Atenolol   Sleep apnea    does not use cpap   Family history- Reviewed and unchanged Social history- Reviewed and unchanged   Review of Systems:  Review of Systems  Constitutional:  Negative for malaise/fatigue and weight loss.  HENT:  Negative for hearing loss and tinnitus.   Eyes:  Negative for blurred vision and double vision.  Respiratory:  Negative for cough, shortness of breath and wheezing.   Cardiovascular:  Negative for chest pain, palpitations, orthopnea, claudication and leg swelling.  Gastrointestinal:  Negative for abdominal pain, blood in stool, constipation, diarrhea, heartburn, melena, nausea and vomiting.  Genitourinary: Negative.   Musculoskeletal:  Negative for joint pain and myalgias.   Skin:  Negative for rash.  Neurological:  Positive for dizziness. Negative for tingling, sensory change, weakness and headaches.  Endo/Heme/Allergies:  Negative for polydipsia.  Psychiatric/Behavioral: Negative.    All other systems reviewed and are negative.    Physical Exam: BP 110/60    Pulse 63    Temp (!) 97.3 F (36.3 C)    Wt 175 lb 3.2 oz (79.5 kg)    SpO2 96%    BMI 25.14 kg/m  Wt Readings from Last 3 Encounters:  08/24/21 175 lb 3.2 oz (79.5 kg)  05/26/21 176 lb 12.8 oz (80.2 kg)  05/07/21 177 lb 6.4 oz (80.5 kg)   General Appearance: Well nourished, in no apparent distress. Eyes: PERRLA, EOMs, conjunctiva no swelling or erythema Sinuses: No Frontal/maxillary tenderness ENT/Mouth: Ext aud canals clear, TMs without erythema, bulging. No erythema, swelling, or exudate on post pharynx.  Tonsils not swollen or erythematous. Hearing normal.  Neck: Supple, thyroid normal.  Respiratory: Respiratory effort normal, BS equal bilaterally without rales, rhonchi, wheezing or stridor.  Cardio: RRR with no MRGs. Brisk peripheral pulses without edema.  Abdomen: Soft, + BS.  Non tender, no guarding, rebound, hernias, masses. Lymphatics: Non tender without lymphadenopathy.  Musculoskeletal: Full ROM, 5/5 strength, Normal gait Skin: Warm, dry without rashes,  ecchymosis. Left foot 4th toe with nail half missing to base, erythema at base, scant purulent discharge from base of nail Neuro: Cranial nerves intact. No cerebellar symptoms.  Psych: Awake and oriented X 3, normal affect, Insight and Judgment appropriate.    Magda Bernheim, NP 9:45 AM [facility]

## 2021-08-24 ENCOUNTER — Encounter: Payer: Self-pay | Admitting: Nurse Practitioner

## 2021-08-24 ENCOUNTER — Ambulatory Visit (INDEPENDENT_AMBULATORY_CARE_PROVIDER_SITE_OTHER): Payer: Medicare Other | Admitting: Nurse Practitioner

## 2021-08-24 ENCOUNTER — Other Ambulatory Visit: Payer: Self-pay

## 2021-08-24 VITALS — BP 110/60 | HR 63 | Temp 97.3°F | Wt 175.2 lb

## 2021-08-24 DIAGNOSIS — E559 Vitamin D deficiency, unspecified: Secondary | ICD-10-CM | POA: Diagnosis not present

## 2021-08-24 DIAGNOSIS — I700 Atherosclerosis of aorta: Secondary | ICD-10-CM

## 2021-08-24 DIAGNOSIS — I25708 Atherosclerosis of coronary artery bypass graft(s), unspecified, with other forms of angina pectoris: Secondary | ICD-10-CM

## 2021-08-24 DIAGNOSIS — E1122 Type 2 diabetes mellitus with diabetic chronic kidney disease: Secondary | ICD-10-CM

## 2021-08-24 DIAGNOSIS — E538 Deficiency of other specified B group vitamins: Secondary | ICD-10-CM

## 2021-08-24 DIAGNOSIS — N182 Chronic kidney disease, stage 2 (mild): Secondary | ICD-10-CM | POA: Diagnosis not present

## 2021-08-24 DIAGNOSIS — I10 Essential (primary) hypertension: Secondary | ICD-10-CM | POA: Diagnosis not present

## 2021-08-24 DIAGNOSIS — D539 Nutritional anemia, unspecified: Secondary | ICD-10-CM

## 2021-08-24 DIAGNOSIS — E039 Hypothyroidism, unspecified: Secondary | ICD-10-CM | POA: Diagnosis not present

## 2021-08-24 DIAGNOSIS — E1169 Type 2 diabetes mellitus with other specified complication: Secondary | ICD-10-CM

## 2021-08-24 DIAGNOSIS — E785 Hyperlipidemia, unspecified: Secondary | ICD-10-CM

## 2021-08-25 LAB — LIPID PANEL
Cholesterol: 129 mg/dL (ref <200)
HDL: 51 mg/dL (ref >=40)
LDL Cholesterol (Calc): 58 mg/dL (calc)
Non-HDL Cholesterol (Calc): 78 mg/dL (calc) (ref <130)
Total CHOL/HDL Ratio: 2.5 (calc) (ref <5.0)
Triglycerides: 110 mg/dL (ref <150)

## 2021-08-25 LAB — CBC WITH DIFFERENTIAL/PLATELET
Absolute Monocytes: 448 cells/uL (ref 200–950)
Basophils Absolute: 22 cells/uL (ref 0–200)
Basophils Relative: 0.4 %
Eosinophils Absolute: 157 cells/uL (ref 15–500)
Eosinophils Relative: 2.9 %
HCT: 39 % (ref 38.5–50.0)
Hemoglobin: 13 g/dL — ABNORMAL LOW (ref 13.2–17.1)
Lymphs Abs: 2236 cells/uL (ref 850–3900)
MCH: 34.1 pg — ABNORMAL HIGH (ref 27.0–33.0)
MCHC: 33.3 g/dL (ref 32.0–36.0)
MCV: 102.4 fL — ABNORMAL HIGH (ref 80.0–100.0)
MPV: 9.4 fL (ref 7.5–12.5)
Monocytes Relative: 8.3 %
Neutro Abs: 2538 cells/uL (ref 1500–7800)
Neutrophils Relative %: 47 %
Platelets: 184 10*3/uL (ref 140–400)
RBC: 3.81 10*6/uL — ABNORMAL LOW (ref 4.20–5.80)
RDW: 11.8 % (ref 11.0–15.0)
Total Lymphocyte: 41.4 %
WBC: 5.4 10*3/uL (ref 3.8–10.8)

## 2021-08-25 LAB — COMPLETE METABOLIC PANEL WITH GFR
AG Ratio: 2 (calc) (ref 1.0–2.5)
ALT: 15 U/L (ref 9–46)
AST: 16 U/L (ref 10–35)
Albumin: 4.9 g/dL (ref 3.6–5.1)
Alkaline phosphatase (APISO): 45 U/L (ref 35–144)
BUN: 13 mg/dL (ref 7–25)
CO2: 31 mmol/L (ref 20–32)
Calcium: 10.1 mg/dL (ref 8.6–10.3)
Chloride: 103 mmol/L (ref 98–110)
Creat: 0.95 mg/dL (ref 0.70–1.28)
Globulin: 2.4 g/dL (calc) (ref 1.9–3.7)
Glucose, Bld: 112 mg/dL — ABNORMAL HIGH (ref 65–99)
Potassium: 5.3 mmol/L (ref 3.5–5.3)
Sodium: 143 mmol/L (ref 135–146)
Total Bilirubin: 0.6 mg/dL (ref 0.2–1.2)
Total Protein: 7.3 g/dL (ref 6.1–8.1)
eGFR: 82 mL/min/{1.73_m2} (ref >=60)

## 2021-08-25 LAB — VITAMIN B12: Vitamin B-12: 873 pg/mL (ref 200–1100)

## 2021-08-25 LAB — TSH: TSH: 1.69 mIU/L (ref 0.40–4.50)

## 2021-08-25 LAB — HEMOGLOBIN A1C
Hgb A1c MFr Bld: 6 % of total Hgb — ABNORMAL HIGH (ref <5.7)
Mean Plasma Glucose: 126 mg/dL
eAG (mmol/L): 7 mmol/L

## 2021-09-14 DIAGNOSIS — C44222 Squamous cell carcinoma of skin of right ear and external auricular canal: Secondary | ICD-10-CM | POA: Diagnosis not present

## 2021-09-14 DIAGNOSIS — C44329 Squamous cell carcinoma of skin of other parts of face: Secondary | ICD-10-CM | POA: Diagnosis not present

## 2021-09-14 DIAGNOSIS — D485 Neoplasm of uncertain behavior of skin: Secondary | ICD-10-CM | POA: Diagnosis not present

## 2021-09-28 ENCOUNTER — Other Ambulatory Visit: Payer: Self-pay | Admitting: Adult Health

## 2021-09-28 ENCOUNTER — Telehealth: Payer: Self-pay | Admitting: Internal Medicine

## 2021-09-28 MED ORDER — ACYCLOVIR 800 MG PO TABS: ORAL_TABLET | ORAL | 0 refills | Status: DC

## 2021-09-28 NOTE — Telephone Encounter (Signed)
DOCUMENTATION: Pt walked in wanting to show a staff member a possible fever blister in order to receive a medication refill. He said he called 2x and both times we said he would need an OV to get any medications prescribed. When looking at his medicine list, it had been over 3 years since Acyclovir was prescribed, he said it was because he only took it as needed. Regardless the prescription is expired and per the office policy if he would need to be seen. I offered to make him an appointment, he refused and left.  ?

## 2021-09-29 DIAGNOSIS — U071 COVID-19: Secondary | ICD-10-CM | POA: Diagnosis not present

## 2021-10-20 DIAGNOSIS — C44329 Squamous cell carcinoma of skin of other parts of face: Secondary | ICD-10-CM | POA: Diagnosis not present

## 2021-10-22 ENCOUNTER — Ambulatory Visit: Payer: Medicare Other | Admitting: Internal Medicine

## 2021-11-02 ENCOUNTER — Other Ambulatory Visit: Payer: Self-pay | Admitting: Adult Health

## 2021-11-17 DIAGNOSIS — D0439 Carcinoma in situ of skin of other parts of face: Secondary | ICD-10-CM | POA: Diagnosis not present

## 2021-11-17 DIAGNOSIS — L905 Scar conditions and fibrosis of skin: Secondary | ICD-10-CM | POA: Diagnosis not present

## 2021-11-17 DIAGNOSIS — L570 Actinic keratosis: Secondary | ICD-10-CM | POA: Diagnosis not present

## 2021-11-17 DIAGNOSIS — L578 Other skin changes due to chronic exposure to nonionizing radiation: Secondary | ICD-10-CM | POA: Diagnosis not present

## 2021-12-02 NOTE — Progress Notes (Signed)
MEDICARE ANNUAL WELLNESS VISIT AND FOLLOW UP Assessment:   Diagnoses and all orders for this visit:  Encounter for Medicare annual wellness exam Due annually   Essential hypertension Well controlled; continue medication Monitor blood pressure at home; call if consistently over 130/80 Continue DASH diet.   Reminder to go to the ER if any CP, SOB, nausea, dizziness, severe HA, changes vision/speech, left arm numbness and tingling and jaw pain.  Coronary artery disease involving coronary bypass graft of native heart, Other angina ([place]) very infrequent CCS class II exertional angina pectoris; continue BB; followed by cardiology (Dr. Sallyanne Higgins) Continue ASA, close management of cholesterol   Atherosclerosis of abdominal aorta ([place]) Per CXR 2017 Control blood pressure, cholesterol, glucose, increase exercise.   Gastroesophageal reflux disease, esophagitis presence not specified Well managed with PRN famotidine Discussed diet, avoiding triggers and other lifestyle changes  Irritable bowel syndrome, unspecified type Avoid trigger foods; currently stable with lifestyle modification only  Type 2 diabetes mellitus with stage 2 chronic kidney disease, without long-term current use of insulin (HCC) Continue metformin Continue diet and exercise.  Perform daily foot/skin check, notify office of any concerning changes.  -     Hemoglobin A1c  CKD II associated with T2DM (HCC)  Increase fluids, avoid NSAIDS, monitor sugars, will monitor  Hypothyroidism, unspecified type Continue synthroid reminded to take on an empty stomach 30-4mns before food.  -     TSH  Mixed hyperlipidemia associated with T2DM (HCC) Continue medications; currently at LDL goal <70 Continue low cholesterol diet and exercise.  Check lipid panel.  -     Lipid panel  Vitamin D deficiency Continue supplementation At goal at last visit; defer checking level  Medication management -     CBC with  Differential/Platelet -     CMP/GFR -     Magnesium  Former smoker Remote; quit in [place] folate levels  Over 30 minutes of exam, counseling, chart review, and critical decision making was performed  Future Appointments  Date Time Provider [place] 12/03/2021  9:30 AM MMagda Bernheim NP [facility] None  03/25/2022 11:00 AM MUnk Pinto MD [facility] None  12/06/2022 10:00 AM MMagda Bernheim NP [facility] None     Plan:   During the course of the visit the patient was educated and counseled about appropriate screening and preventive services including:   Pneumococcal vaccine  Influenza vaccine Prevnar 13 Td vaccine Screening electrocardiogram Colorectal cancer screening Diabetes screening Glaucoma screening Nutrition counseling    Subjective:  MSHYNE LEHRKEis a 76y.o. male who presents for Medicare Annual Wellness Visit and 3 month follow up for HTN, hyperlipidemia, diabetes with CKD, hypothyroid and vitamin D Def.   Had MOH's surgery under R eye and has done well since, has had several BCC/SCC follows with skin surgery center.   Patient has ASCAD with stenting in 2001, CABG 2008 with very infrequent CCS class II exertional angina pectoris and followed by Dr. CSallyanne Higgins Recent admission for chest pain in 02/2019 resulted in unremarkable cardiac workup and attributed to GERD.   he has a diagnosis of GERD which is currently managed by famotidine 20 mg BID PRN (few times per month), well controlled.   BMI is There is no height or weight on file to calculate BMI., he has been working on diet and exercise, will walk with dog, has been very active around house and yard, cutting down trees. Watching portions.  Wt Readings from Last 3 Encounters:  08/24/21 175 lb 3.2 oz (79.5 kg)  05/26/21 176 lb 12.8 oz (80.2 kg)  05/07/21 177 lb 6.4 oz (80.5 kg)   He has aortic atherosclerosis per CXR 2017.  His blood pressure has been controlled  at home, today their BP is   He does workout. He denies chest pain, shortness of breath, dizziness.   He is on cholesterol medication (rosuvastatin 10 mg twice weekly) and denies myalgias (he stopped taking gemfibrozil due to muscle aches, does not tolerate high dose statins). His cholesterol is at goal. The cholesterol last visit was:   Lab Results  Component Value Date   CHOL 129 08/24/2021   HDL 51 08/24/2021   LDLCALC 58 08/24/2021   TRIG 110 08/24/2021   CHOLHDL 2.5 08/24/2021   He has been working on diet and exercise for T2 diabetes (treated by metformin 1000 mg BID), he is on ASA, Statin, ARB and denies increased appetite, nausea, polydipsia, polyuria, visual disturbances, vomiting and weight loss. He does check fasting glucose in the AM, typically 100-115. Last A1C in the office was:  Lab Results  Component Value Date   HGBA1C 6.0 (H) 08/24/2021   He has CKD II associated with T2DM monitored at this office:  Lab Results  Component Value Date   GFRNONAA 72 10/22/2020   He is on thyroid medication. His medication was not changed last visit. Takes 50 mcg daily, takes with other meds in the morning.  Lab Results  Component Value Date   TSH 1.69 08/24/2021    Patient is on Vitamin D supplement and at goal at last check:   Lab Results  Component Value Date   VD25OH 64 02/25/2021     He has had persistent macrocytic hyperchromic cells without significant anemia since 2018, He is taking SL B12 2500 daily Lab Results  Component Value Date   QBHALPFX90 240 08/24/2021   No results found for: FOLATE    Medication Review: Current Outpatient Medications on File Prior to Visit  Medication Sig Dispense Refill   acyclovir (ZOVIRAX) 800 MG tablet Take 1 tablet daily as needed for Fever Blister / Mouth Ulcers 90 tablet 0   aspirin EC 81 MG tablet Take 1 tablet (81 mg total) by mouth daily. 90 tablet 3   atenolol (TENORMIN) 50 MG tablet TAKE 1 TABLET DAILY FOR BP & HEART 90 tablet  3   Bayer Microlet Lancets lancets CHECK BLOOD SUGAR ONCE A DAY 100 each 0   Blood Glucose Monitoring Suppl DEVI Use kit to check blood sugar once a day 1 each 0   Cholecalciferol 5000 UNITS TABS Take 1 tablet by mouth daily.     Cyanocobalamin (B-12) 1000 MCG SUBL Place 1 tablet under the tongue daily. 2500 mcg  every other day     ezetimibe (ZETIA) 10 MG tablet Take 1 tablet (10 mg total) by mouth daily. 90 tablet 3   famotidine (PEPCID) 20 MG tablet Take 1 tablet (20 mg total) by mouth 2 (two) times daily. 180 tablet 1   glucose blood (CONTOUR NEXT TEST) test strip USE TO TEST BLOOD SUGAR ONCE DAILY 100 strip 12   Lancets MISC Use to test blood sugar once daily 200 each 3   levothyroxine (SYNTHROID) 50 MCG tablet TAKE 1 TABLET BY MOUTH DAILY ON AN EMPTY STOMACH WITH ONLY WATER FOR 30 MINUTES & NO ANTACID MEDS, CALCIUM OR MAGNESIUM FOR 4 HOURS & AVOID BIOTIN 90 tablet 3   losartan (COZAAR) 25 MG tablet TAKE 1/2 TABLET  BY MOUTH EVERY DAY AT BEDTIME 45 tablet 3   metFORMIN (GLUCOPHAGE-XR) 500 MG 24 hr tablet TAKE 2 TABLETS 2 TIMES PER DAY WITH MEALS FOR DIABETES 360 tablet 1   Multiple Vitamin (MULTIVITAMIN) tablet Take 1 tablet by mouth at bedtime.      Omega-3 Fatty Acids (FISH OIL) 1000 MG CAPS Take 1,000 mg by mouth at bedtime.      rosuvastatin (CRESTOR) 10 MG tablet TAKE 1 TABLET 2 X /WEEK FOR CHOLESTEROL 26 tablet 3   triamcinolone (NASACORT) 55 MCG/ACT AERO nasal inhaler Place 1 spray into the nose daily as needed. 1 each 2   No current facility-administered medications on file prior to visit.    Allergies: Allergies  Allergen Reactions   Gemfibrozil Other (See Comments)    Muscle aches   Ace Inhibitors Cough   Lipitor [Atorvastatin] Other (See Comments)    Joint pain.    Current Problems (verified) has [name] w/Stage 2 CKD ([place]); Hyperlipidemia associated with type 2 diabetes mellitus ([place]); Hypertension; GERD (gastroesophageal reflux disease); IBS (irritable bowel  syndrome); Vitamin D deficiency; Medication management; CAD s/p CABG; Hypothyroidism; Atherosclerosis of abdominal aorta ([place]) by CXR in 2017 ; CKD stage 2 due to type 2 diabetes mellitus ([place]); BMI 24.0-24.9, adult; Former smoker; History of Mohs micrographic surgery for skin cancer; Macrocytic anemia; B12 deficiency; and COVID-19 (01/29/2021) on their problem list.  Screening Tests Immunization History  Administered Date(s) Administered   DT (Pediatric) 08/29/2003, 06/04/2015   Influenza Split 02/21/2019   Influenza, High Dose Seasonal PF 04/03/2018, 02/21/2019, 03/05/2020   Influenza-Unspecified 04/16/2014, 03/30/2016, 04/02/2017   Moderna SARS-COV2 Booster Vaccination 10/21/2020   Moderna Sars-Covid-2 Vaccination 07/16/2019, 08/16/2019, 04/27/2020   Pneumococcal Conjugate-13 12/26/2013   Pneumococcal Polysaccharide-23 01/29/2010   Zoster Recombinat (Shingrix) 01/31/2017, 07/05/2017, 08/09/2017   Zoster, Live 05/11/2010    Preventative care: Last colonoscopy: 2005, 2018 following positive cologuard which was negative, DONE per GI Cologuard: 2018  CXR 02/2019  Prior vaccinations: TD or Tdap: 2016  Influenza: 03/2020 Pneumococcal: 2011 Prevnar13: 2015 Shingles/Zostavax: 2011, 2019 2/2 Covid 19: 2/2, 2021, 2 boosters  Names of Other Physician/Practitioners you currently use: 1. [facility] here for primary care 2. Dr. Gershon Crane, eye doctor, last visit  - diabetic eye exam 05/07/2020 - report received and abstracted 3. Dr. Isac Caddy, dentist, last visit  2022, typically goes q51m Patient Care Team: MUnk Pinto MD as PCP - General (Internal Medicine) CSanda Klein MD as PCP - Cardiology (Cardiology) SRutherford Guys MD as Consulting Physician (Ophthalmology) EKristeen Miss MD as Consulting Physician (Neurosurgery) HTeena Irani MD (Inactive) as Consulting Physician (Gastroenterology)  Surgical: He  has a past surgical history that  includes Cardiac surgery; Knee surgery (Right); Back surgery; Coronary angioplasty (2001); Coronary artery bypass graft; Surgery scrotal / testicular; Elbow surgery (Left); Colonoscopy; Lumbar laminectomy/decompression microdiscectomy (Right, 08/04/2014); and Cataract extraction, bilateral (Bilateral, 2018). Family His family history includes Arthritis in his mother; CAD in his brother and sister; Diabetes in his maternal grandmother; Heart attack in his brother and father; Hypertension in his brother; Ulcers in his mother. Social history  He reports that he quit smoking about 48 years ago. His smoking use included cigarettes. He has never used smokeless tobacco. He reports that he does not drink alcohol and does not use drugs.  MEDICARE WELLNESS OBJECTIVES: Physical activity:   Cardiac risk factors:   Depression/mood screen:      10/22/2020   11:43 AM  Depression screen PHQ 2/9  Decreased Interest 0  Down, Depressed, Hopeless 0  PHQ - 2 Score 0    ADLs:      View : No data to display.           Cognitive Testing  Alert? Yes  Normal Appearance?Yes  Oriented to person? Yes  Place? Yes   Time? Yes  Recall of three objects?  Yes  Can perform simple calculations? Yes  Displays appropriate judgment?Yes  Can read the correct time from a watch face?Yes  EOL planning:     Objective:   There were no vitals filed for this visit.  There is no height or weight on file to calculate BMI.  General appearance: alert, no distress, WD/WN, male HEENT: normocephalic, sclerae anicteric, TMs pearly, nares patent, no discharge or erythema, pharynx normal Oral cavity: MMM, no lesions Neck: supple, no lymphadenopathy, no thyromegaly, no masses Heart: RRR, normal S1, S2, no murmurs Lungs: CTA bilaterally, no wheezes, rhonchi, or rales Abdomen: +bs, soft, non tender, non distended, no masses, no hepatomegaly, no splenomegaly Musculoskeletal: nontender, no swelling, no obvious  deformity Extremities: no edema, no cyanosis, no clubbing Pulses: 2+ symmetric, upper 1+ diminished bilateral lower extremities, normal cap refill Neurological: alert, oriented x 3, CN2-12 intact, strength normal upper extremities and lower extremities, sensation normal throughout, DTRs 2+ throughout, no cerebellar signs, gait normal Psychiatric: normal affect, behavior normal, pleasant   Medicare Attestation I have personally reviewed: The patient's medical and social history Their use of alcohol, tobacco or illicit drugs Their current medications and supplements The patient's functional ability including ADLs,fall risks, home safety risks, cognitive, and hearing and visual impairment Diet and physical activities Evidence for depression or mood disorders  The patient's weight, height, BMI, and visual acuity have been recorded in the chart.  I have made referrals, counseling, and provided education to the patient based on review of the above and I have provided the patient with a written personalized care plan for preventive services.     Joshua Bernheim, NP   12/02/2021

## 2021-12-03 ENCOUNTER — Encounter: Payer: Self-pay | Admitting: Nurse Practitioner

## 2021-12-03 ENCOUNTER — Ambulatory Visit (INDEPENDENT_AMBULATORY_CARE_PROVIDER_SITE_OTHER): Payer: Medicare Other | Admitting: Nurse Practitioner

## 2021-12-03 VITALS — BP 128/78 | HR 62 | Temp 97.9°F | Resp 16 | Ht 70.0 in | Wt 174.8 lb

## 2021-12-03 DIAGNOSIS — Z79899 Other long term (current) drug therapy: Secondary | ICD-10-CM

## 2021-12-03 DIAGNOSIS — I10 Essential (primary) hypertension: Secondary | ICD-10-CM

## 2021-12-03 DIAGNOSIS — I700 Atherosclerosis of aorta: Secondary | ICD-10-CM

## 2021-12-03 DIAGNOSIS — Z87891 Personal history of nicotine dependence: Secondary | ICD-10-CM | POA: Diagnosis not present

## 2021-12-03 DIAGNOSIS — N182 Chronic kidney disease, stage 2 (mild): Secondary | ICD-10-CM

## 2021-12-03 DIAGNOSIS — K219 Gastro-esophageal reflux disease without esophagitis: Secondary | ICD-10-CM

## 2021-12-03 DIAGNOSIS — I25708 Atherosclerosis of coronary artery bypass graft(s), unspecified, with other forms of angina pectoris: Secondary | ICD-10-CM

## 2021-12-03 DIAGNOSIS — R6889 Other general symptoms and signs: Secondary | ICD-10-CM | POA: Diagnosis not present

## 2021-12-03 DIAGNOSIS — E1169 Type 2 diabetes mellitus with other specified complication: Secondary | ICD-10-CM

## 2021-12-03 DIAGNOSIS — D539 Nutritional anemia, unspecified: Secondary | ICD-10-CM | POA: Diagnosis not present

## 2021-12-03 DIAGNOSIS — E039 Hypothyroidism, unspecified: Secondary | ICD-10-CM

## 2021-12-03 DIAGNOSIS — E559 Vitamin D deficiency, unspecified: Secondary | ICD-10-CM | POA: Diagnosis not present

## 2021-12-03 DIAGNOSIS — K589 Irritable bowel syndrome without diarrhea: Secondary | ICD-10-CM | POA: Diagnosis not present

## 2021-12-03 DIAGNOSIS — E1122 Type 2 diabetes mellitus with diabetic chronic kidney disease: Secondary | ICD-10-CM

## 2021-12-03 DIAGNOSIS — Z0001 Encounter for general adult medical examination with abnormal findings: Secondary | ICD-10-CM

## 2021-12-03 DIAGNOSIS — E785 Hyperlipidemia, unspecified: Secondary | ICD-10-CM | POA: Diagnosis not present

## 2021-12-03 DIAGNOSIS — Z0000 Encounter for general adult medical examination without abnormal findings: Secondary | ICD-10-CM

## 2021-12-04 LAB — COMPLETE METABOLIC PANEL WITH GFR
AG Ratio: 1.7 (calc) (ref 1.0–2.5)
ALT: 14 U/L (ref 9–46)
AST: 17 U/L (ref 10–35)
Albumin: 4.6 g/dL (ref 3.6–5.1)
Alkaline phosphatase (APISO): 46 U/L (ref 35–144)
BUN: 13 mg/dL (ref 7–25)
CO2: 29 mmol/L (ref 20–32)
Calcium: 9.9 mg/dL (ref 8.6–10.3)
Chloride: 103 mmol/L (ref 98–110)
Creat: 0.99 mg/dL (ref 0.70–1.28)
Globulin: 2.7 g/dL (calc) (ref 1.9–3.7)
Glucose, Bld: 103 mg/dL — ABNORMAL HIGH (ref 65–99)
Potassium: 5.6 mmol/L — ABNORMAL HIGH (ref 3.5–5.3)
Sodium: 140 mmol/L (ref 135–146)
Total Bilirubin: 0.8 mg/dL (ref 0.2–1.2)
Total Protein: 7.3 g/dL (ref 6.1–8.1)
eGFR: 78 mL/min/{1.73_m2} (ref >=60)

## 2021-12-04 LAB — CBC WITH DIFFERENTIAL/PLATELET
Absolute Monocytes: 500 cells/uL (ref 200–950)
Basophils Absolute: 31 cells/uL (ref 0–200)
Basophils Relative: 0.6 %
Eosinophils Absolute: 148 cells/uL (ref 15–500)
Eosinophils Relative: 2.9 %
HCT: 38.5 % (ref 38.5–50.0)
Hemoglobin: 12.6 g/dL — ABNORMAL LOW (ref 13.2–17.1)
Lymphs Abs: 1984 cells/uL (ref 850–3900)
MCH: 33.7 pg — ABNORMAL HIGH (ref 27.0–33.0)
MCHC: 32.7 g/dL (ref 32.0–36.0)
MCV: 102.9 fL — ABNORMAL HIGH (ref 80.0–100.0)
MPV: 9.6 fL (ref 7.5–12.5)
Monocytes Relative: 9.8 %
Neutro Abs: 2438 cells/uL (ref 1500–7800)
Neutrophils Relative %: 47.8 %
Platelets: 190 10*3/uL (ref 140–400)
RBC: 3.74 10*6/uL — ABNORMAL LOW (ref 4.20–5.80)
RDW: 11.9 % (ref 11.0–15.0)
Total Lymphocyte: 38.9 %
WBC: 5.1 10*3/uL (ref 3.8–10.8)

## 2021-12-04 LAB — LIPID PANEL
Cholesterol: 117 mg/dL (ref <200)
HDL: 53 mg/dL (ref >=40)
LDL Cholesterol (Calc): 47 mg/dL (calc)
Non-HDL Cholesterol (Calc): 64 mg/dL (calc) (ref <130)
Total CHOL/HDL Ratio: 2.2 (calc) (ref <5.0)
Triglycerides: 89 mg/dL (ref <150)

## 2021-12-04 LAB — HEMOGLOBIN A1C
Hgb A1c MFr Bld: 6.1 % of total Hgb — ABNORMAL HIGH (ref <5.7)
Mean Plasma Glucose: 128 mg/dL
eAG (mmol/L): 7.1 mmol/L

## 2021-12-04 LAB — TSH: TSH: 1.7 mIU/L (ref 0.40–4.50)

## 2021-12-05 ENCOUNTER — Other Ambulatory Visit: Payer: Self-pay | Admitting: Nurse Practitioner

## 2021-12-05 DIAGNOSIS — E875 Hyperkalemia: Secondary | ICD-10-CM

## 2021-12-15 ENCOUNTER — Other Ambulatory Visit: Payer: Self-pay | Admitting: Cardiovascular Disease

## 2021-12-16 ENCOUNTER — Ambulatory Visit (INDEPENDENT_AMBULATORY_CARE_PROVIDER_SITE_OTHER): Payer: Medicare Other

## 2021-12-16 DIAGNOSIS — E875 Hyperkalemia: Secondary | ICD-10-CM | POA: Diagnosis not present

## 2021-12-16 NOTE — Progress Notes (Signed)
The patient came in for recheck of potassium today. He reports no new issues or concerns.

## 2021-12-17 LAB — BASIC METABOLIC PANEL WITH GFR
BUN: 13 mg/dL (ref 7–25)
CO2: 27 mmol/L (ref 20–32)
Calcium: 9.6 mg/dL (ref 8.6–10.3)
Chloride: 103 mmol/L (ref 98–110)
Creat: 0.97 mg/dL (ref 0.70–1.28)
Glucose, Bld: 106 mg/dL — ABNORMAL HIGH (ref 65–99)
Potassium: 4.9 mmol/L (ref 3.5–5.3)
Sodium: 139 mmol/L (ref 135–146)
eGFR: 80 mL/min/{1.73_m2} (ref >=60)

## 2021-12-25 ENCOUNTER — Other Ambulatory Visit: Payer: Self-pay | Admitting: Adult Health

## 2022-01-17 DIAGNOSIS — L578 Other skin changes due to chronic exposure to nonionizing radiation: Secondary | ICD-10-CM | POA: Diagnosis not present

## 2022-01-17 DIAGNOSIS — L570 Actinic keratosis: Secondary | ICD-10-CM | POA: Diagnosis not present

## 2022-01-23 ENCOUNTER — Emergency Department (HOSPITAL_BASED_OUTPATIENT_CLINIC_OR_DEPARTMENT_OTHER)
Admission: EM | Admit: 2022-01-23 | Discharge: 2022-01-24 | Disposition: A | Discharge status: Home / Self Care | Payer: Medicare Other | Attending: Emergency Medicine | Admitting: Emergency Medicine

## 2022-01-23 ENCOUNTER — Encounter (HOSPITAL_BASED_OUTPATIENT_CLINIC_OR_DEPARTMENT_OTHER): Payer: Self-pay | Admitting: Emergency Medicine

## 2022-01-23 ENCOUNTER — Other Ambulatory Visit: Payer: Self-pay

## 2022-01-23 ENCOUNTER — Emergency Department (HOSPITAL_BASED_OUTPATIENT_CLINIC_OR_DEPARTMENT_OTHER): Payer: Medicare Other

## 2022-01-23 DIAGNOSIS — Z79899 Other long term (current) drug therapy: Secondary | ICD-10-CM | POA: Insufficient documentation

## 2022-01-23 DIAGNOSIS — Z85828 Personal history of other malignant neoplasm of skin: Secondary | ICD-10-CM | POA: Diagnosis not present

## 2022-01-23 DIAGNOSIS — R0789 Other chest pain: Secondary | ICD-10-CM | POA: Diagnosis not present

## 2022-01-23 DIAGNOSIS — Z951 Presence of aortocoronary bypass graft: Secondary | ICD-10-CM | POA: Diagnosis not present

## 2022-01-23 DIAGNOSIS — Z7984 Long term (current) use of oral hypoglycemic drugs: Secondary | ICD-10-CM | POA: Insufficient documentation

## 2022-01-23 DIAGNOSIS — Z7982 Long term (current) use of aspirin: Secondary | ICD-10-CM | POA: Insufficient documentation

## 2022-01-23 DIAGNOSIS — E039 Hypothyroidism, unspecified: Secondary | ICD-10-CM | POA: Insufficient documentation

## 2022-01-23 DIAGNOSIS — E119 Type 2 diabetes mellitus without complications: Secondary | ICD-10-CM | POA: Diagnosis not present

## 2022-01-23 DIAGNOSIS — I10 Essential (primary) hypertension: Secondary | ICD-10-CM | POA: Diagnosis not present

## 2022-01-23 DIAGNOSIS — I2510 Atherosclerotic heart disease of native coronary artery without angina pectoris: Secondary | ICD-10-CM | POA: Diagnosis not present

## 2022-01-23 DIAGNOSIS — K439 Ventral hernia without obstruction or gangrene: Secondary | ICD-10-CM | POA: Diagnosis not present

## 2022-01-23 DIAGNOSIS — R079 Chest pain, unspecified: Secondary | ICD-10-CM | POA: Diagnosis not present

## 2022-01-23 LAB — TROPONIN I (HIGH SENSITIVITY): Troponin I (High Sensitivity): 4 ng/L (ref <18)

## 2022-01-23 LAB — BASIC METABOLIC PANEL
Anion gap: 9 (ref 5–15)
BUN: 18 mg/dL (ref 8–23)
CO2: 29 mmol/L (ref 22–32)
Calcium: 10.3 mg/dL (ref 8.9–10.3)
Chloride: 103 mmol/L (ref 98–111)
Creatinine, Ser: 0.96 mg/dL (ref 0.61–1.24)
GFR, Estimated: >60 mL/min (ref >60)
Glucose, Bld: 103 mg/dL — ABNORMAL HIGH (ref 70–99)
Potassium: 4.2 mmol/L (ref 3.5–5.1)
Sodium: 141 mmol/L (ref 135–145)

## 2022-01-23 LAB — CBC
HCT: 38.5 % — ABNORMAL LOW (ref 39.0–52.0)
Hemoglobin: 12.6 g/dL — ABNORMAL LOW (ref 13.0–17.0)
MCH: 33.5 pg (ref 26.0–34.0)
MCHC: 32.7 g/dL (ref 30.0–36.0)
MCV: 102.4 fL — ABNORMAL HIGH (ref 80.0–100.0)
Platelets: 174 10*3/uL (ref 150–400)
RBC: 3.76 MIL/uL — ABNORMAL LOW (ref 4.22–5.81)
RDW: 12.4 % (ref 11.5–15.5)
WBC: 5.3 10*3/uL (ref 4.0–10.5)
nRBC: 0 % (ref 0.0–0.2)

## 2022-01-23 MED ORDER — ALUM & MAG HYDROXIDE-SIMETH 200-200-20 MG/5ML PO SUSP
30.0000 mL | Freq: Once | ORAL | Status: AC
  Administered 2022-01-24: 30 mL via ORAL
  Filled 2022-01-23: qty 30

## 2022-01-23 NOTE — ED Provider Notes (Signed)
[facility] EMERGENCY DEPT Provider Note   CSN: [id number] Arrival date & time: 01/23/22  2247     History  Chief Complaint  Patient presents with   Chest Pain    Joshua Higgins is a 76 y.o. male.  HPI     This is a 76 year old male with a history of coronary artery disease who presents with chest pain.  Patient reports intermittent chest pain over the last several days.  He describes it as "heartburn."  He states that it does get better with antacids.  It is not postprandial.  It is not exertional in nature.  He has however had some sweating with these episodes.  No shortness of breath.  No cough or fevers.  Patient with coronary artery bypass grafting approximately 10 years ago.  He had a positive stress test but did not have an MI at that time.  Has not noted any lower extremity swelling.  Currently pain-free.  Home Medications Prior to Admission medications   Medication Sig Start Date End Date Taking? Authorizing Provider  acyclovir (ZOVIRAX) 800 MG tablet TAKE 1 TABLET DAILY AS NEEDED FOR FEVER BLISTER / MOUTH ULCERS 12/26/21   Alycia Rossetti, NP  aspirin EC 81 MG tablet Take 1 tablet (81 mg total) by mouth daily. 02/13/19   Croitoru, Mihai, MD  atenolol (TENORMIN) 50 MG tablet TAKE 1 TABLET DAILY FOR BP & HEART 01/15/21   Alycia Rossetti, NP  Bayer Microlet Lancets lancets CHECK BLOOD SUGAR ONCE A DAY 11/02/19   Vladimir Crofts, PA-C  Blood Glucose Monitoring Suppl DEVI Use kit to check blood sugar once a day 09/03/18   Liane Comber, NP  Cholecalciferol 5000 UNITS TABS Take 1 tablet by mouth daily.    [provider]  Cyanocobalamin (B-12) 1000 MCG SUBL Place 1 tablet under the tongue daily. 2500 mcg  every other day    [provider]  ezetimibe (ZETIA) 10 MG tablet Take 1 tablet (10 mg total) by mouth daily. 07/29/21 10/27/21  Croitoru, Mihai, MD  famotidine (PEPCID) 20 MG tablet Take 1 tablet (20 mg total) by mouth 2 (two) times daily.  08/16/21   Alycia Rossetti, NP  glucose blood (CONTOUR NEXT TEST) test strip USE TO TEST BLOOD SUGAR ONCE DAILY 12/23/20   Liane Comber, NP  Lancets MISC Use to test blood sugar once daily 07/08/20   Liane Comber, NP  levothyroxine (SYNTHROID) 50 MCG tablet TAKE 1 TABLET BY MOUTH DAILY ON AN EMPTY STOMACH WITH ONLY WATER FOR 30 MINUTES & NO ANTACID MEDS, CALCIUM OR MAGNESIUM FOR 4 HOURS & AVOID BIOTIN 07/27/21   Liane Comber, NP  losartan (COZAAR) 25 MG tablet TAKE 1/2 TABLET BY MOUTH EVERY DAY AT BEDTIME 12/15/21   Croitoru, Mihai, MD  metFORMIN (GLUCOPHAGE-XR) 500 MG 24 hr tablet TAKE 2 TABLETS 2 TIMES PER DAY WITH MEALS FOR DIABETES 11/02/21   Alycia Rossetti, NP  Multiple Vitamin (MULTIVITAMIN) tablet Take 1 tablet by mouth at bedtime.     [provider]  Omega-3 Fatty Acids (FISH OIL) 1000 MG CAPS Take 1,000 mg by mouth at bedtime.     [provider]  rosuvastatin (CRESTOR) 10 MG tablet TAKE 1 TABLET 2 X /WEEK FOR CHOLESTEROL 02/15/21   Unk Pinto, MD  triamcinolone (NASACORT) 55 MCG/ACT AERO nasal inhaler Place 1 spray into the nose daily as needed. 10/22/20 10/22/21  Liane Comber, NP      Allergies    Gemfibrozil, Ace inhibitors, and  Lipitor [atorvastatin]    Review of Systems   Review of Systems  Constitutional:  Negative for fever.  Respiratory:  Negative for shortness of breath.   Cardiovascular:  Positive for chest pain.  All other systems reviewed and are negative.   Physical Exam Updated Vital Signs BP (!) 144/65   Pulse 62   Temp 98.3 F (36.8 C) (Oral)   Resp 16   Ht 1.778 m ($Remove'5\' 10"'aTRlRyk$ )   Wt 76.7 kg   SpO2 98%   BMI 24.25 kg/m  Physical Exam Vitals and nursing note reviewed.  Constitutional:      Appearance: He is well-developed. He is not ill-appearing.  HENT:     Head: Normocephalic and atraumatic.  Eyes:     Pupils: Pupils are equal, round, and reactive to light.  Cardiovascular:     Rate and Rhythm: Normal rate and regular  rhythm.     Heart sounds: Normal heart sounds. No murmur heard.    Comments: Midline sternotomy scar well-healed Pulmonary:     Effort: Pulmonary effort is normal. No respiratory distress.     Breath sounds: Normal breath sounds. No wheezing.  Chest:     Comments: Ventral hernia noted Abdominal:     General: Bowel sounds are normal.     Palpations: Abdomen is soft.     Tenderness: There is no abdominal tenderness. There is no rebound.  Musculoskeletal:     Cervical back: Neck supple.     Right lower leg: No edema.     Left lower leg: No edema.  Lymphadenopathy:     Cervical: No cervical adenopathy.  Skin:    General: Skin is warm and dry.  Neurological:     Mental Status: He is alert and oriented to person, place, and time.  Psychiatric:        Mood and Affect: Mood normal.     ED Results / Procedures / Treatments   Labs (all labs ordered are listed, but only abnormal results are displayed) Labs Reviewed  BASIC METABOLIC PANEL - Abnormal; Notable for the following components:      Result Value   Glucose, Bld 103 (*)    All other components within normal limits  CBC - Abnormal; Notable for the following components:   RBC 3.76 (*)    Hemoglobin 12.6 (*)    HCT 38.5 (*)    MCV 102.4 (*)    All other components within normal limits  TROPONIN I (HIGH SENSITIVITY)  TROPONIN I (HIGH SENSITIVITY)    EKG EKG Interpretation  Date/Time:  Sunday January 23 2022 22:55:44 EDT Ventricular Rate:  69 PR Interval:  185 QRS Duration: 103 QT Interval:  382 QTC Calculation: 410 R Axis:   -29 Text Interpretation: Sinus rhythm Probable left atrial enlargement Borderline left axis deviation Confirmed by Thayer Jew [phone]) on 01/23/2022 11:40:32 PM  Radiology DG Chest Port 1 View  Result Date: 01/23/2022 CLINICAL DATA:  Center 2 left chest pain since yesterday. EXAM: PORTABLE CHEST 1 VIEW COMPARISON:  02/18/2019 FINDINGS: Postoperative changes in the mediastinum. Heart size and  pulmonary vascularity are normal. Lungs are clear. No pleural effusions. No pneumothorax. Mediastinal contours appear intact. Calcification of the aorta. Degenerative changes in the spine and shoulders. IMPRESSION: No active disease. Electronically Signed   By: Lucienne Capers M.D.   On: 01/23/2022 23:18    Procedures Procedures    Medications Ordered in ED Medications  alum & mag hydroxide-simeth (MAALOX/MYLANTA) 200-200-20 MG/5ML suspension 30 mL (30 mLs Oral  Given 01/24/22 0003)    ED Course/ Medical Decision Making/ A&P Clinical Course as of 01/24/22 0147  Mon Jan 24, 2022  0115 Patient reports improvement with a GI cocktail.  Initial heart testing is reassuring.  He has a significant heart history.  Chart reviewed with last stress test noted in 2021 which is normal.  He does have a cardiologist.  Discussed with patient options.  He would like to go home and follow-up with cardiology if his repeat heart testing is reassuring. [CH]    Clinical Course User Index [CH] Lovie Agresta, Barbette Hair, MD                           Medical Decision Making Amount and/or Complexity of Data Reviewed Labs: ordered. Radiology: ordered.  Risk OTC drugs.   This patient presents to the ED for concern of chest pain, this involves an extensive number of treatment options, and is a complaint that carries with it a high risk of complications and morbidity.  I considered the following differential and admission for this acute, potentially life threatening condition.  The differential diagnosis includes ACS, pneumonia, pneumothorax, reflux, pancreatitis, PE  MDM:    This is a 76 year old male who presents with chest discomfort. Currently he is comfortable.  Pain is described as "heartburn."  It does get better with antacids.  It is nonexertional.  It is quite atypical for ACS although patient is high risk with a known history.  EKG shows no evidence of acute ischemia or arrhythmia.  Chest x-ray without evidence  of pneumothorax or pneumonia.  Initial testing is reassuring including troponin.  Doubt PE.  There is no mediastinal widening to suggest dissection.  Patient had improvement with GI cocktail.  Repeat troponin negative.  He wishes to go home and follow-up with cardiology.  Although his high risk, feel that chest pain is atypical and that he can follow-up safely with cardiology as an outpatient.  He and his wife are advised of return precautions.  (Labs, imaging, consults)  Labs: I Ordered, and personally interpreted labs.  The pertinent results include: CBC, BMP, troponin x2  Imaging Studies ordered: I ordered imaging studies including chest x-ray I independently visualized and interpreted imaging. I agree with the radiologist interpretation  Additional history obtained from wife at bedside.  External records from outside source obtained and reviewed including prior stress testing  Cardiac Monitoring: The patient was maintained on a cardiac monitor.  I personally viewed and interpreted the cardiac monitored which showed an underlying rhythm of: Normal sinus rhythm  Reevaluation: After the interventions noted above, I reevaluated the patient and found that they have :resolved  Social Determinants of Health: Lives independently  Disposition: Discharge  Co morbidities that complicate the patient evaluation  Past Medical History:  Diagnosis Date   Anemia    low iron   Arthritis    Cancer ([place])    skin cancer on face   Cataracts, bilateral    Coronary artery disease    CABG - 2008   Diabetes mellitus without complication ([place])    type II   GERD (gastroesophageal reflux disease)    Hepatitis    Hyperlipidemia    Hypertension    pt states he does not have HTN, takes Atenolol for migraines   Hypothyroidism    Migraine headache with aura    takes Atenolol   Sleep apnea    does not use cpap     Medicines Meds  ordered this encounter  Medications   alum & mag hydroxide-simeth  (MAALOX/MYLANTA) 200-200-20 MG/5ML suspension 30 mL    I have reviewed the patients home medicines and have made adjustments as needed  Problem List / ED Course: Problem List Items Addressed This Visit   None Visit Diagnoses     Atypical chest pain    -  Primary                   Final Clinical Impression(s) / ED Diagnoses Final diagnoses:  Atypical chest pain    Rx / DC Orders ED Discharge Orders     None         Merryl Hacker, MD 01/24/22 0147

## 2022-01-23 NOTE — ED Triage Notes (Signed)
Pt c/o center to left chest pain intermittently since yesterday; reports acid reflux; hx of double bypass; denies n/v/SOB

## 2022-01-24 DIAGNOSIS — R0789 Other chest pain: Secondary | ICD-10-CM | POA: Diagnosis not present

## 2022-01-24 LAB — TROPONIN I (HIGH SENSITIVITY): Troponin I (High Sensitivity): 5 ng/L (ref <18)

## 2022-01-24 NOTE — Discharge Instructions (Signed)
You were seen today for chest pain.  Your heart testing is reassuring today.  Given your history, it is very important that you follow-up with your cardiologist.  Your last stress test was in 2021.  You may need additional definitive study.  If you develop new or worsening symptoms, you should be reevaluated.

## 2022-01-25 ENCOUNTER — Telehealth: Payer: Self-pay | Admitting: *Deleted

## 2022-01-25 DIAGNOSIS — R072 Precordial pain: Secondary | ICD-10-CM

## 2022-01-25 NOTE — Telephone Encounter (Signed)
Per Dr. Sallyanne Kuster,  He was seen in the ED for atypical chest pain, now about 13 years post-CABG. Enzymes negative. Can we please schedule for a PET scan. He should be due office follow up in October as well, please schedule first available after PET. Thanks.

## 2022-01-27 NOTE — Progress Notes (Signed)
Cardiology Clinic Note   Patient Name: Joshua Higgins Date of Encounter: 01/28/2022  Primary Care Provider:  Unk Pinto, MD Primary Cardiologist:  Sanda Klein, MD    Cardiology Clinic Note   Patient Name: Joshua Higgins Date of Encounter: 01/28/2022  Primary Care Provider:  Unk Pinto, MD Primary Cardiologist:  Sanda Klein, MD  Patient Profile    76 year old male with known history of coronary artery disease with CABG in 2010 (LIMA to LAD, SVG to ramus intermediate and OM, SVG to acute marginal branch of RCA, hyperlipidemia, hypertension, type 2 diabetes controlled with oral antidiabetics, ED, and migraines.  He did develop statin myopathy with daily doses of statin but has been able to tolerate rosuvastatin twice a week.  Dr. Sallyanne Kuster noted that the patient never had angina with his CAD, only finding abnormalities on a stress test leading to cath and CABG.  Last seen in the office on 05/07/2021.  Past Medical History    Past Medical History:  Diagnosis Date   Anemia    low iron   Arthritis    Cancer ([place])    skin cancer on face   Cataracts, bilateral    Coronary artery disease    CABG - 2008   Diabetes mellitus without complication ([place])    type II   GERD (gastroesophageal reflux disease)    Hepatitis    Hyperlipidemia    Hypertension    pt states he does not have HTN, takes Atenolol for migraines   Hypothyroidism    Migraine headache with aura    takes Atenolol   Sleep apnea    does not use cpap   Past Surgical History:  Procedure Laterality Date   BACK SURGERY     CARDIAC SURGERY     Bypass   CATARACT EXTRACTION, BILATERAL Bilateral 2018   Dr. Gershon Crane   COLONOSCOPY     CORONARY ANGIOPLASTY  2001   CORONARY ARTERY BYPASS GRAFT     ELBOW SURGERY Left    due to damaged nerve   KNEE SURGERY Right    arthroscopic   LUMBAR LAMINECTOMY/DECOMPRESSION MICRODISCECTOMY Right 08/04/2014   Procedure: Right Lumbar four-five Microdiskectomy;   Surgeon: Kristeen Miss, MD;  Location: [facility];  Service: Neurosurgery;  Laterality: Right;  Right L4-5 Microdiskectomy   SURGERY SCROTAL / TESTICULAR     as a child/teenager    Allergies  Allergies  Allergen Reactions   Gemfibrozil Other (See Comments)    Muscle aches   Ace Inhibitors Cough   Lipitor [Atorvastatin] Other (See Comments)    Joint pain.    History of Present Illness    Joshua Higgins presents today for ongoing assessment and management of coronary artery disease with history as above.  Was recently seen in the emergency room on 01/23/2022 with complaints of chest discomfort.  Reported it is intermittent over the last several days prior to being seen and described it as heartburn.  It did not get better with antacids.  It was not postprandial.  The patient also reported that he was having diaphoresis with the episodes, but no dyspnea.  He was ruled out for ACS EKG revealed sinus rhythm with left atrial enlargement and left axis deviation.  He was treated with Maalox and referred to GI, diagnosed with atypical chest pain.  He is here for follow-up.  He comes today without any cardiac complaints.  He was started on a PPI and has had no further chest discomfort.  Dr. Sallyanne Kuster has planned for  a cardiac PET scan which has been ordered date to be determined.  He states he is cut back on eating somewhat chocolate, and has been consistent on taking PPI each day.  At home he also states that his blood pressures running 299-371 systolic over 69C.  He states that his daughter is a Marine scientist and make sure that he takes his blood pressure every day.  He denies any shortness of breath, or fatigue.  He does have chronic vertigo which she states has plagued him for years.  Home Medications    Current Outpatient Medications  Medication Sig Dispense Refill   acyclovir (ZOVIRAX) 800 MG tablet TAKE 1 TABLET DAILY AS NEEDED FOR FEVER BLISTER / MOUTH ULCERS 90 tablet 0   aspirin EC 81 MG tablet Take  1 tablet (81 mg total) by mouth daily. 90 tablet 3   atenolol (TENORMIN) 50 MG tablet TAKE 1 TABLET DAILY FOR BP & HEART 90 tablet 3   Bayer Microlet Lancets lancets CHECK BLOOD SUGAR ONCE A DAY 100 each 0   Blood Glucose Monitoring Suppl DEVI Use kit to check blood sugar once a day 1 each 0   Cholecalciferol 5000 UNITS TABS Take 1 tablet by mouth daily.     Cyanocobalamin (B-12) 1000 MCG SUBL Place 1 tablet under the tongue daily. 2500 mcg  every other day     famotidine (PEPCID) 20 MG tablet Take 1 tablet (20 mg total) by mouth 2 (two) times daily. 180 tablet 1   glucose blood (CONTOUR NEXT TEST) test strip USE TO TEST BLOOD SUGAR ONCE DAILY 100 strip 12   Lancets MISC Use to test blood sugar once daily 200 each 3   levothyroxine (SYNTHROID) 50 MCG tablet TAKE 1 TABLET BY MOUTH DAILY ON AN EMPTY STOMACH WITH ONLY WATER FOR 30 MINUTES & NO ANTACID MEDS, CALCIUM OR MAGNESIUM FOR 4 HOURS & AVOID BIOTIN 90 tablet 3   metFORMIN (GLUCOPHAGE-XR) 500 MG 24 hr tablet TAKE 2 TABLETS 2 TIMES PER DAY WITH MEALS FOR DIABETES 360 tablet 1   Multiple Vitamin (MULTIVITAMIN) tablet Take 1 tablet by mouth at bedtime.      Omega-3 Fatty Acids (FISH OIL) 1000 MG CAPS Take 1,000 mg by mouth at bedtime.      rosuvastatin (CRESTOR) 10 MG tablet TAKE 1 TABLET 2 X /WEEK FOR CHOLESTEROL 26 tablet 3   ezetimibe (ZETIA) 10 MG tablet Take 1 tablet (10 mg total) by mouth daily. 90 tablet 3   triamcinolone (NASACORT) 55 MCG/ACT AERO nasal inhaler Place 1 spray into the nose daily as needed. 1 each 2   No current facility-administered medications for this visit.     Family History    Family History  Problem Relation Age of Onset   Ulcers Mother    Arthritis Mother    Heart attack Father    CAD Sister    Heart attack Brother    CAD Brother    Hypertension Brother    Diabetes Maternal Grandmother    He indicated that the status of his mother is unknown. He indicated that the status of his father is unknown. He  indicated that one of his two sisters is deceased. He indicated that one of his two brothers is deceased. He indicated that the status of his maternal grandmother is unknown.  Social History    Social History   Socioeconomic History   Marital status: Married    Spouse name: Not on file   Number of children: Not on  file   Years of education: Not on file   Highest education level: Not on file  Occupational History   Not on file  Tobacco Use   Smoking status: Former    Types: Cigarettes    Quit date: 10/01/1973    Years since quitting: 48.3   Smokeless tobacco: Never  Substance and Sexual Activity   Alcohol use: No   Drug use: No   Sexual activity: Not on file  Other Topics Concern   Not on file  Social History Narrative   Not on file   Social Determinants of Health   Financial Resource Strain: Not on file  Food Insecurity: Not on file  Transportation Needs: Not on file  Physical Activity: Not on file  Stress: Not on file  Social Connections: Not on file  Intimate Partner Violence: Not on file     Review of Systems    General:  No chills, fever, night sweats or weight changes.  Cardiovascular:  No chest pain, dyspnea on exertion, edema, orthopnea, palpitations, paroxysmal nocturnal dyspnea. Dermatological: No rash, lesions/masses Respiratory: No cough, dyspnea Urologic: No hematuria, dysuria Abdominal:   No nausea, vomiting, diarrhea, bright red blood per rectum, melena, or hematemesis Neurologic:  No visual changes, wkns, changes in mental status. All other systems reviewed and are otherwise negative except as noted above.   Physical Exam    VS:  BP 140/72 (BP Location: Left Arm, Patient Position: Sitting, Cuff Size: Normal)   Pulse (!) 56   Resp 20   Ht $R'5\' 10"'DL$  (1.778 m)   Wt 168 lb 12.8 oz (76.6 kg)   SpO2 98%   BMI 24.22 kg/m  , BMI Body mass index is 24.22 kg/m.     GEN: Well nourished, well developed, in no acute distress. HEENT: normal. Neck:  Supple, no JVD, carotid bruits, or masses. Cardiac: RRR, no murmurs, rubs, or gallops. No clubbing, cyanosis, edema.  Radials/DP/PT 2+ and equal bilaterally.  Respiratory:  Respirations regular and unlabored, clear to auscultation bilaterally. GI: Soft, nontender, nondistended, BS + x 4. MS: no deformity or atrophy. Skin: warm and dry, no rash. Neuro:  Strength and sensation are intact. Psych: Normal affect.  Accessory Clinical Findings    ECG personally reviewed by me today-sinus bradycardia, T wave flattening V6, heart rate of 56 bpm- No acute changes  Lab Results  Component Value Date   WBC 5.3 01/23/2022   HGB 12.6 (L) 01/23/2022   HCT 38.5 (L) 01/23/2022   MCV 102.4 (H) 01/23/2022   PLT 174 01/23/2022   Lab Results  Component Value Date   CREATININE 0.96 01/23/2022   BUN 18 01/23/2022   NA 141 01/23/2022   K 4.2 01/23/2022   CL 103 01/23/2022   CO2 29 01/23/2022   Lab Results  Component Value Date   ALT 14 12/03/2021   AST 17 12/03/2021   ALKPHOS 53 02/14/2017   BILITOT 0.8 12/03/2021   Lab Results  Component Value Date   CHOL 117 12/03/2021   HDL 53 12/03/2021   LDLCALC 47 12/03/2021   TRIG 89 12/03/2021   CHOLHDL 2.2 12/03/2021    Lab Results  Component Value Date   HGBA1C 6.1 (H) 12/03/2021    Review of Prior Studies: Exercise tolerance test 05-10-2020 Blood pressure demonstrated a normal response to exercise. There was no ST segment deviation noted during stress.   ETT with mildly impaired exercise tolerance (5:30); no chest pain; normal blood pressure response; no diagnostic ST changes; 3 beats  nonsustained ventricular tachycardia noted; negative adequate exercise tolerance test.  Echocardiogram 02/19/2019 1. The left ventricle has normal systolic function, with an ejection  fraction of 55-60%. The cavity size was normal. Left ventricular diastolic  Doppler parameters are consistent with impaired relaxation.   2. The right ventricle has normal  systolic function. The cavity was  normal. There is no increase in right ventricular wall thickness.   3. The mitral valve is grossly normal.   4. The tricuspid valve is grossly normal.   5. The aortic valve is tricuspid. No stenosis of the aortic valve.   6. The aorta is normal unless otherwise noted.   7. The inferior vena cava was dilated in size with <50% respiratory  variability.   Assessment & Plan   1.  Hypertension: I rechecked his blood pressure here in the office and found it to be 136/68.  He apparently had been on losartan in the past but was unable to continue this due to hyperkalemia.  I will continue him on atenolol 50 mg daily only.  Can consider adding low-dose amlodipine if necessary for better blood pressure control as his heart rate is bradycardic on atenolol and would not tolerate higher dose.  2.  Coronary artery disease: History of CABG in 2010 (four-vessel).  Was seen in the ED for chest discomfort.  Diagnosed with GERD.  However, Dr. Sallyanne Kuster would like him to have a repeat PET scan for diagnostic prognostic purposes.  This has been ordered with date to be determined.  He is willing to proceed with PET scan.  3.  GERD: Remains on PPI.  Cutting down on chocolate.  Should follow-up with GI if symptoms are persistent  4.  Type 2 diabetes: Followed by PCP.  5.  Hypercholesterolemia: Does not tolerate daily statin but can tolerate rosuvastatin 10 mg twice a week.  Follow-up labs per PCP   Current medicines are reviewed at length with the patient today.  I have spent 25 min's  dedicated to the care of this patient on the date of this encounter to include pre-visit review of records, assessment, management and diagnostic testing,with shared decision making. Signed, Phill Myron. West Pugh, ANP, AACC   01/28/2022 9:45 AM    [address] Office [phone] Fax [phone]

## 2022-01-27 NOTE — Telephone Encounter (Signed)
Spoke to the patient about his recent ED visit. He stated that he has not had any chest pain since the visit. He has been advised that Dr. Sallyanne Kuster recommends a PET scan. This has been ordered.   He has an appointment with Jory Sims, DNP tomorrow, 7/28. He has been advised to keep that appointment and that the PET scan instructions will be given to him then.

## 2022-01-28 ENCOUNTER — Encounter: Payer: Self-pay | Admitting: Adult Health

## 2022-01-28 ENCOUNTER — Ambulatory Visit (INDEPENDENT_AMBULATORY_CARE_PROVIDER_SITE_OTHER): Payer: Medicare Other | Admitting: Adult Health

## 2022-01-28 VITALS — BP 140/72 | HR 56 | Resp 20 | Ht 70.0 in | Wt 168.8 lb

## 2022-01-28 DIAGNOSIS — K219 Gastro-esophageal reflux disease without esophagitis: Secondary | ICD-10-CM | POA: Diagnosis not present

## 2022-01-28 DIAGNOSIS — N182 Chronic kidney disease, stage 2 (mild): Secondary | ICD-10-CM | POA: Diagnosis not present

## 2022-01-28 DIAGNOSIS — E1122 Type 2 diabetes mellitus with diabetic chronic kidney disease: Secondary | ICD-10-CM

## 2022-01-28 DIAGNOSIS — I25810 Atherosclerosis of coronary artery bypass graft(s) without angina pectoris: Secondary | ICD-10-CM | POA: Diagnosis not present

## 2022-01-28 DIAGNOSIS — I10 Essential (primary) hypertension: Secondary | ICD-10-CM

## 2022-01-28 DIAGNOSIS — E7800 Pure hypercholesterolemia, unspecified: Secondary | ICD-10-CM

## 2022-01-28 NOTE — Patient Instructions (Signed)
Medication Instructions:  No Changes *If you need a refill on your cardiac medications before your next appointment, please call your pharmacy*   Lab Work: No Labs If you have labs (blood work) drawn today and your tests are completely normal, you will receive your results only by: [place] (if you have MyChart) OR A paper copy in the mail If you have any lab test that is abnormal or we need to change your treatment, we will call you to review the results.   Testing/Procedures: How to Prepare for Your Cardiac PET/CT Stress Test:  1. Please do not take these medications before your test:   Medications that may interfere with the cardiac pharmacological stress agent (ex. nitrates - including erectile dysfunction medications or beta-blockers) the day of the exam. (Erectile dysfunction medication should be held for at least 72 hrs prior to test) Theophylline containing medications for 12 hours. Dipyridamole 48 hours prior to the test. Your remaining medications may be taken with water.  2. Nothing to eat or drink, except water, 3 hours prior to arrival time.   NO caffeine/decaffeinated products, or chocolate 12 hours prior to arrival.  3. NO perfume, cologne or lotion  4. Total time is 1 to 2 hours; you may want to bring reading material for the waiting time.  5. Please report to Admitting at the [facility] Main Entrance 60 minutes early for your test.  [place], [place] [address]  Diabetic Preparation:  Hold oral medications. You may take NPH and Lantus insulin. Do not take Humalog or Humulin R (Regular Insulin) the day of your test. Check blood sugars prior to leaving the house. If able to eat breakfast prior to 3 hour fasting, you may take all medications, including your insulin, Do not worry if you miss your breakfast dose of insulin - start at your next meal.  IF YOU THINK YOU MAY BE PREGNANT, OR ARE NURSING PLEASE INFORM THE  TECHNOLOGIST.  In preparation for your appointment, medication and supplies will be purchased.  Appointment availability is limited, so if you need to cancel or reschedule, please call the Radiology Department at [phone]  24 hours in advance to avoid a cancellation fee of $100.00  What to Expect After you Arrive:  Once you arrive and check in for your appointment, you will be taken to a preparation room within the Radiology Department.  A technologist or Nurse will obtain your medical history, verify that you are correctly prepped for the exam, and explain the procedure.  Afterwards,  an IV will be started in your arm and electrodes will be placed on your skin for EKG monitoring during the stress portion of the exam. Then you will be escorted to the PET/CT scanner.  There, staff will get you positioned on the scanner and obtain a blood pressure and EKG.  During the exam, you will continue to be connected to the EKG and blood pressure machines.  A small, safe amount of a radioactive tracer will be injected in your IV to obtain a series of pictures of your heart along with an injection of a stress agent.    After your Exam:  It is recommended that you eat a meal and drink a caffeinated beverage to counter act any effects of the stress agent.  Drink plenty of fluids for the remainder of the day and urinate frequently for the first couple of hours after the exam.  Your doctor will inform you of your test  results within 7-10 business days.  For questions about your test or how to prepare for your test, please call: Marchia Bond, Cardiac Imaging Nurse Navigator  Gordy Clement, Cardiac Imaging Nurse Navigator Office: [phone]    Follow-Up: At [facility], you and your health needs are our priority.  As part of our continuing mission to provide you with exceptional heart care, we have created designated Provider Care Teams.  These Care Teams include your primary Cardiologist (physician) and  Advanced Practice Providers (APPs -  Physician Assistants and Nurse Practitioners) who all work together to provide you with the care you need, when you need it.  We recommend signing up for the patient portal called "MyChart".  Sign up information is provided on this After Visit Summary.  MyChart is used to connect with patients for Virtual Visits (Telemedicine).  Patients are able to view lab/test results, encounter notes, upcoming appointments, etc.  Non-urgent messages can be sent to your provider as well.   To learn more about what you can do with MyChart, go to [email or web address].    Your next appointment:   After Pet Scan  The format for your next appointment:   In Person  Provider:   Sanda Klein, MD

## 2022-02-02 ENCOUNTER — Other Ambulatory Visit: Payer: Self-pay | Admitting: Internal Medicine

## 2022-02-22 ENCOUNTER — Ambulatory Visit (INDEPENDENT_AMBULATORY_CARE_PROVIDER_SITE_OTHER): Payer: Medicare Other | Admitting: Internal Medicine

## 2022-02-22 ENCOUNTER — Encounter: Payer: Self-pay | Admitting: Internal Medicine

## 2022-02-22 VITALS — BP 143/80 | HR 62 | Temp 97.9°F | Resp 16 | Ht 70.0 in | Wt 170.0 lb

## 2022-02-22 DIAGNOSIS — I25810 Atherosclerosis of coronary artery bypass graft(s) without angina pectoris: Secondary | ICD-10-CM | POA: Diagnosis not present

## 2022-02-22 DIAGNOSIS — G43101 Migraine with aura, not intractable, with status migrainosus: Secondary | ICD-10-CM

## 2022-02-22 MED ORDER — VERAPAMIL HCL ER 240 MG PO TBCR: EXTENDED_RELEASE_TABLET | ORAL | 3 refills | Status: DC

## 2022-02-22 MED ORDER — TOPIRAMATE 100 MG PO TABS: ORAL_TABLET | ORAL | 1 refills | Status: DC

## 2022-02-22 NOTE — Progress Notes (Signed)
    Future Appointments  Date Time Provider Department  02/22/2022  4:00 PM Unk Pinto, MD [facility]  03/25/2022 11:00 AM Unk Pinto, MD [facility]  12/06/2022 10:00 AM Alycia Rossetti, NP [facility]    History of Present Illness:          The patient is a very nice 76 y.o. MWM  with HTN, [name], [name]  /CKD2,  and Vitamin D Deficiency who presents with  new onset of migraine  /vascular Headaches. Describes classic visual Aura.    Medications    levothyroxine  50 MCG tablet, TAKE 1 TABLET DAILY    metFORMIN XR 500 MG 24 hr tablet, TAKE 2 TABS 2 TIMES PER DAY    atenolol 50 MG tablet, TAKE 1 TABLET DAILY    ezetimibe 10 MG tablet, Take 1 tablet daily.   rosuvastatin 0 MG tablet, TAKE 1 TABLET 2 TIMES PER WEEK    NASACORT nasal inhaler, Place 1 spray into the nose daily as needed.   aspirin EC 81 MG tablet, Take 1 tablet daily.   B-12 1000 MCG SL, Place 1 tablet under the tongue / 2500 mcg  every other day   acyclovir  800 MG tablet, TAKE 1 TABLET DAILY AS NEEDED FOR FEVER BLISTER   Cholecalciferol 5000 UNITS  Take 1 tablet by mouth daily.   famotidine (PEPCID) 20 MG tablet, Take 1 tablet 2 times daily.   Multiple Vitamin , Take 1 tablet at bedtime.    Omega-3 FISH OIL  1000 MG CAPS, Take 1 at bedtime.   Problem list He has [name] w/Stage 2 CKD ([place]); Hyperlipidemia associated with type 2 diabetes mellitus ([place]); Hypertension; GERD (gastroesophageal reflux disease); IBS (irritable bowel syndrome); Vitamin D deficiency; Medication management; CAD s/p CABG; Hypothyroidism; Atherosclerosis of abdominal aorta ([place]) by CXR in 2017 ; CKD stage 2 due to type 2 diabetes mellitus ([place]); BMI 24.0-24.9, adult; Former smoker; History of Mohs micrographic surgery for skin cancer; Macrocytic anemia; B12 deficiency; and COVID-19 (01/29/2021) on their problem list.   Observations/Objective:  BP (!) 143/80   Pulse 62   Temp 97.9 F (36.6 C)   Resp 16   Ht '5\' 10"'$  (1.778 m)   Wt 170  lb (77.1 kg)   SpO2 99%   BMI 24.39 kg/m   HEENT - WNL. Neck - supple.  Chest - Clear equal BS. Cor - Nl HS. RRR w/o sig MGR. PP 1(+). No edema. MS- FROM w/o deformities.  Gait Nl. Neuro -  Nl w/o focal abnormalities.  Assessment and Plan:  1. Migraine with aura and with status migrainosus, not intractable  - Stop  Atenolol  - verapamil -SR) 240 MG CR ;  Take 1 tablet every Morning with Food  for BP & Migraine Prevention   Dispense: 90 tablet; Refill: 3  - topiramate 100 MG tablet;  Take  1 tablet at Bedtime for Migraine Prevention   Dispense: 90 tablet; Refill: 1   Follow Up Instructions:      I discussed the assessment and treatment plan with the patient. The patient was provided an opportunity to ask questions and all were answered. The patient agreed with the plan and demonstrated an understanding of the instructions.       The patient was advised to call back or seek an in-person evaluation if the symptoms worsen or if the condition fails to improve as anticipated.   Kirtland Bouchard, MD

## 2022-02-25 ENCOUNTER — Encounter: Payer: Self-pay | Admitting: Internal Medicine

## 2022-02-25 NOTE — Patient Instructions (Signed)
Migraine Headache A migraine headache is an intense, throbbing pain on one side or both sides of the head. Migraine headaches may also cause other symptoms, such as nausea, vomiting, and sensitivity to light and noise. A migraine headache can last from 4 hours to 3 days. Talk with your doctor about what things may bring on (trigger) your migraine headaches. What are the causes? The exact cause of this condition is not known. However, a migraine may be caused when nerves in the brain become irritated and release chemicals that cause inflammation of blood vessels. This inflammation causes pain. This condition may be triggered or caused by: Drinking alcohol. Smoking. Taking medicines, such as: Medicine used to treat chest pain (nitroglycerin). Birth control pills. Estrogen. Certain blood pressure medicines. Eating or drinking products that contain nitrates, glutamate, aspartame, or tyramine. Aged cheeses, chocolate, or caffeine may also be triggers. Doing physical activity. Other things that may trigger a migraine headache include: Menstruation. Pregnancy. Hunger. Stress. Lack of sleep or too much sleep. Weather changes. Fatigue. What increases the risk? The following factors may make you more likely to experience migraine headaches: Being a certain age. This condition is more common in people who are 25-55 years old. Being male. Having a family history of migraine headaches. Being Caucasian. Having a mental health condition, such as depression or anxiety. Being obese. What are the signs or symptoms? The main symptom of this condition is pulsating or throbbing pain. This pain may: Happen in any area of the head, such as on one side or both sides. Interfere with daily activities. Get worse with physical activity. Get worse with exposure to bright lights or loud noises. Other symptoms may include: Nausea. Vomiting. Dizziness. General sensitivity to bright lights, loud noises, or  smells. Before you get a migraine headache, you may get warning signs (an aura). An aura may include: Seeing flashing lights or having blind spots. Seeing bright spots, halos, or zigzag lines. Having tunnel vision or blurred vision. Having numbness or a tingling feeling. Having trouble talking. Having muscle weakness. Some people have symptoms after a migraine headache (postdromal phase), such as: Feeling tired. Difficulty concentrating. How is this diagnosed? A migraine headache can be diagnosed based on: Your symptoms. A physical exam. Tests, such as: CT scan or an MRI of the head. These imaging tests can help rule out other causes of headaches. Taking fluid from the spine (lumbar puncture) and analyzing it (cerebrospinal fluid analysis, or CSF analysis). How is this treated? This condition may be treated with medicines that: Relieve pain. Relieve nausea. Prevent migraine headaches. Treatment for this condition may also include: Acupuncture. Lifestyle changes like avoiding foods that trigger migraine headaches. Biofeedback. Cognitive behavioral therapy. Follow these instructions at home: Medicines Take over-the-counter and prescription medicines only as told by your health care provider. Ask your health care provider if the medicine prescribed to you: Requires you to avoid driving or using heavy machinery. Can cause constipation. You may need to take these actions to prevent or treat constipation: Drink enough fluid to keep your urine pale yellow. Take over-the-counter or prescription medicines. Eat foods that are high in fiber, such as beans, whole grains, and fresh fruits and vegetables. Limit foods that are high in fat and processed sugars, such as fried or sweet foods. Lifestyle Do not drink alcohol. Do not use any products that contain nicotine or tobacco, such as cigarettes, e-cigarettes, and chewing tobacco. If you need help quitting, ask your health care  provider. Get at least 8   hours of sleep every night. Find ways to manage stress, such as meditation, deep breathing, or yoga. General instructions     Keep a journal to find out what may trigger your migraine headaches. For example, write down: What you eat and drink. How much sleep you get. Any change to your diet or medicines. If you have a migraine headache: Avoid things that make your symptoms worse, such as bright lights. It may help to lie down in a dark, quiet room. Do not drive or use heavy machinery. Ask your health care provider what activities are safe for you while you are experiencing symptoms. Keep all follow-up visits as told by your health care provider. This is important. Contact a health care provider if: You develop symptoms that are different or more severe than your usual migraine headache symptoms. You have more than 15 headache days in one month. Get help right away if: Your migraine headache becomes severe. Your migraine headache lasts longer than 72 hours. You have a fever. You have a stiff neck. You have vision loss. Your muscles feel weak or like you cannot control them. You start to lose your balance often. You have trouble walking. You faint. You have a seizure. Summary A migraine headache is an intense, throbbing pain on one side or both sides of the head. Migraines may also cause other symptoms, such as nausea, vomiting, and sensitivity to light and noise. This condition may be treated with medicines and lifestyle changes. You may also need to avoid certain things that trigger a migraine headache. Keep a journal to find out what may trigger your migraine headaches. Contact your health care provider if you have more than 15 headache days in a month or you develop symptoms that are different or more severe than your usual migraine headache symptoms. This information is not intended to replace advice given to you by your health care provider. Make sure  you discuss any questions you have with your health care provider. Document Revised: 10/12/2018 Document Reviewed: 08/02/2018 Elsevier Patient Education  2023 Elsevier Inc.  

## 2022-02-28 ENCOUNTER — Encounter: Payer: Medicare Other | Admitting: Internal Medicine

## 2022-02-28 NOTE — Progress Notes (Unsigned)
Assessment and Plan:  There are no diagnoses linked to this encounter.    Further disposition pending results of labs. Discussed med's effects and SE's.   Over 30 minutes of exam, counseling, chart review, and critical decision making was performed.   Future Appointments  Date Time Provider Department Center  03/01/2022  2:45 PM Raynelle Dick, NP [facility] None  03/25/2022 11:00 AM [name], MD [facility] None  12/06/2022 10:00 AM Raynelle Dick, NP [facility] None    ------------------------------------------------------------------------------------------------------------------   HPI There were no vitals taken for this visit. 76 y.o.male presents for  Past Medical History:  Diagnosis Date   Anemia    low iron   Arthritis    Cancer (HCC)    skin cancer on face   Cataracts, bilateral    Coronary artery disease    CABG - 2008   Diabetes mellitus without complication (HCC)    type II   GERD (gastroesophageal reflux disease)    Hepatitis    Hyperlipidemia    Hypertension    pt states he does not have HTN, takes Atenolol for migraines   Hypothyroidism    Migraine headache with aura    takes Atenolol   Sleep apnea    does not use cpap     Allergies  Allergen Reactions   Gemfibrozil Other (See Comments)    Muscle aches   Ace Inhibitors Cough   Lipitor [Atorvastatin] Other (See Comments)    Joint pain.    Current Outpatient Medications on File Prior to Visit  Medication Sig   acyclovir (ZOVIRAX) 800 MG tablet TAKE 1 TABLET DAILY AS NEEDED FOR FEVER BLISTER / MOUTH ULCERS   aspirin EC 81 MG tablet Take 1 tablet (81 mg total) by mouth daily.   atenolol (TENORMIN) 50 MG tablet TAKE 1 TABLET DAILY FOR BP & HEART   Bayer Microlet Lancets lancets CHECK BLOOD SUGAR ONCE A DAY   Blood Glucose Monitoring Suppl DEVI Use kit to check blood sugar once a day   Cholecalciferol 5000 UNITS TABS Take 1 tablet by mouth daily.   Cyanocobalamin (B-12) 1000 MCG SUBL  Place 1 tablet under the tongue daily. 2500 mcg  every other day   ezetimibe (ZETIA) 10 MG tablet Take 1 tablet (10 mg total) by mouth daily.   famotidine (PEPCID) 20 MG tablet Take 1 tablet (20 mg total) by mouth 2 (two) times daily.   glucose blood (CONTOUR NEXT TEST) test strip USE TO TEST BLOOD SUGAR ONCE DAILY   Lancets MISC Use to test blood sugar once daily   levothyroxine (SYNTHROID) 50 MCG tablet TAKE 1 TABLET BY MOUTH DAILY ON AN EMPTY STOMACH WITH ONLY WATER FOR 30 MINUTES & NO ANTACID MEDS, CALCIUM OR MAGNESIUM FOR 4 HOURS & AVOID BIOTIN   metFORMIN (GLUCOPHAGE-XR) 500 MG 24 hr tablet TAKE 2 TABLETS 2 TIMES PER DAY WITH MEALS FOR DIABETES   Multiple Vitamin (MULTIVITAMIN) tablet Take 1 tablet by mouth at bedtime.    Omega-3 Fatty Acids (FISH OIL) 1000 MG CAPS Take 1,000 mg by mouth at bedtime.    rosuvastatin (CRESTOR) 10 MG tablet TAKE 1 TABLET 2 TIMES PER WEEK FOR CHOLESTEROL   topiramate (TOPAMAX) 100 MG tablet Take  1 tablet at Bedtime for Migraine Prevention   triamcinolone (NASACORT) 55 MCG/ACT AERO nasal inhaler Place 1 spray into the nose daily as needed.   verapamil (CALAN-SR) 240 MG CR tablet Take 1 tablet every Morning with Food  for BP & Migraine Prevention  No current facility-administered medications on file prior to visit.    ROS: all negative except above.   Physical Exam:  There were no vitals taken for this visit.  General Appearance: Well nourished, in no apparent distress. Eyes: PERRLA, EOMs, conjunctiva no swelling or erythema Sinuses: No Frontal/maxillary tenderness ENT/Mouth: Ext aud canals clear, TMs without erythema, bulging. No erythema, swelling, or exudate on post pharynx.  Tonsils not swollen or erythematous. Hearing normal.  Neck: Supple, thyroid normal.  Respiratory: Respiratory effort normal, BS equal bilaterally without rales, rhonchi, wheezing or stridor.  Cardio: RRR with no MRGs. Brisk peripheral pulses without edema.  Abdomen: Soft, +  BS.  Non tender, no guarding, rebound, hernias, masses. Lymphatics: Non tender without lymphadenopathy.  Musculoskeletal: Full ROM, 5/5 strength, normal gait.  Skin: Warm, dry without rashes, lesions, ecchymosis.  Neuro: Cranial nerves intact. Normal muscle tone, no cerebellar symptoms. Sensation intact.  Psych: Awake and oriented X 3, normal affect, Insight and Judgment appropriate.     Alycia Rossetti, NP 1:00 PM [facility]

## 2022-03-01 ENCOUNTER — Ambulatory Visit (INDEPENDENT_AMBULATORY_CARE_PROVIDER_SITE_OTHER): Payer: Medicare Other | Admitting: Nurse Practitioner

## 2022-03-01 ENCOUNTER — Encounter: Payer: Self-pay | Admitting: Nurse Practitioner

## 2022-03-01 VITALS — BP 134/60 | HR 75 | Temp 97.5°F | Ht 70.0 in | Wt 167.4 lb

## 2022-03-01 DIAGNOSIS — R413 Other amnesia: Secondary | ICD-10-CM

## 2022-03-01 DIAGNOSIS — R519 Headache, unspecified: Secondary | ICD-10-CM

## 2022-03-01 DIAGNOSIS — R42 Dizziness and giddiness: Secondary | ICD-10-CM | POA: Diagnosis not present

## 2022-03-01 DIAGNOSIS — G43101 Migraine with aura, not intractable, with status migrainosus: Secondary | ICD-10-CM

## 2022-03-01 DIAGNOSIS — K5900 Constipation, unspecified: Secondary | ICD-10-CM

## 2022-03-01 DIAGNOSIS — I25810 Atherosclerosis of coronary artery bypass graft(s) without angina pectoris: Secondary | ICD-10-CM

## 2022-03-01 NOTE — Patient Instructions (Addendum)
Stop the Topiramate and watch to see if headaches return and monitor memory, constipation.   Can use Dulcolax and Miralax for constipation  Verapamil Sustained-Release Tablets What is this medication? VERAPAMIL (ver AP a mil) treats high blood pressure and prevents chest pain (angina). It works by relaxing the blood vessels, which helps decrease the amount of work your heart has to do. It belongs to a group of medications called calcium channel blockers. This medicine may be used for other purposes; ask your health care provider or pharmacist if you have questions. COMMON BRAND NAME(S): Calan SR, Covera-HS, Isoptin, Isoptin SR What should I tell my care team before I take this medication? They need to know if you have any of these conditions: Duchenne muscular dystrophy Heart disease Irregular heartbeat or rhythm Liver disease Low blood pressure An unusual or allergic reaction to verapamil, other medications, foods, dyes or preservatives Pregnant or trying to get pregnant Breast-feeding How should I use this medication? Take this medication by mouth. Take it as directed on the prescription label at the same time every day. Take it with food. You may cut the tablet in half if it is scored (has a line in the middle of it). This may help you swallow the tablet if the whole tablet is too big. Be sure to take both halves. Do not take just one-half of the tablet. Keep taking it unless your care team tells you to stop. Do not take this medication with grapefruit juice. Talk to your care team about the use of this medication in children. Special care may be needed. Overdosage: If you think you have taken too much of this medicine contact a poison control center or emergency room at once. NOTE: This medicine is only for you. Do not share this medicine with others. What if I miss a dose? If you miss a dose, take it as soon as you can. If it is almost time for your next dose, take only that dose. Do  not take double or extra doses. What may interact with this medication? Do not take this medication with any of the following: Cisapride Disopyramide Dofetilide Grapefruit juice Hawthorn Pimozide Red yeast rice This medication may also interact with the following: Barbiturates such as phenobarbital Cimetidine Cyclosporine Lithium Local anesthetics or general anesthetics Medications for heart rhythm problems like amiodarone, digoxin, flecainide, procainamide, quinidine Medications for high blood pressure or heart problems Medications for seizures like carbamazepine and phenytoin Rifampin, rifabutin or rifapentine Theophylline or aminophylline This list may not describe all possible interactions. Give your health care provider a list of all the medicines, herbs, non-prescription drugs, or dietary supplements you use. Also tell them if you smoke, drink alcohol, or use illegal drugs. Some items may interact with your medicine. What should I watch for while using this medication? Visit your care team for regular checks on your progress. Check your blood pressure as directed. Ask your care team what your blood pressure should be. Also, find out when you should contact them. Do not treat yourself for coughs, colds, or pain while you are using this medication without asking your care team for advice. Some medications may increase your blood pressure. You may get drowsy or dizzy. Do not drive, use machinery, or do anything that needs mental alertness until you know how this medication affects you. Do not stand up or sit up quickly, especially if you are an older patient. This reduces the risk of dizzy or fainting spells. What side effects may I  notice from receiving this medication? Side effects that you should report to your care team as soon as possible: Allergic reactions--skin rash, itching, hives, swelling of the face, lips, tongue, or throat Heart failure--shortness of breath, swelling of  the ankles, feet, or hands, sudden weight gain, unusual weakness or fatigue Slow heartbeat--dizziness, feeling faint or lightheaded, trouble breathing, unusual weakness or fatigue Liver injury--right upper belly pain, loss of appetite, nausea, light-colored stool, dark yellow or brown urine, yellowing skin or eyes, unusual weakness or fatigue Low blood pressure--dizziness, feeling faint or lightheaded, blurry vision Side effects that usually do not require medical attention (report to your care team if they continue or are bothersome): Constipation Dizziness Headache Nausea This list may not describe all possible side effects. Call your doctor for medical advice about side effects. You may report side effects to FDA at 1-800-FDA-1088. Where should I keep my medication? Keep out of the reach of children and pets. Store at room temperature between 20 and 25 degrees C (68 and 77 degrees F). Protect from light and moisture. Keep the container tightly closed. Throw away any unused medication after the expiration date. NOTE: This sheet is a summary. It may not cover all possible information. If you have questions about this medicine, talk to your doctor, pharmacist, or health care provider.  2023 Elsevier/Gold Standard (2020-06-12 00:00:00)

## 2022-03-02 ENCOUNTER — Ambulatory Visit (HOSPITAL_COMMUNITY)
Admission: RE | Admit: 2022-03-02 | Discharge: 2022-03-02 | Disposition: A | Discharge status: Home / Self Care | Payer: Medicare Other | Source: Ambulatory Visit | Attending: Nurse Practitioner | Admitting: Nurse Practitioner

## 2022-03-02 DIAGNOSIS — R42 Dizziness and giddiness: Secondary | ICD-10-CM | POA: Diagnosis not present

## 2022-03-02 DIAGNOSIS — R519 Headache, unspecified: Secondary | ICD-10-CM | POA: Insufficient documentation

## 2022-03-02 DIAGNOSIS — R413 Other amnesia: Secondary | ICD-10-CM | POA: Insufficient documentation

## 2022-03-02 MED ORDER — GADOBUTROL 1 MMOL/ML IV SOLN
7.5000 mL | Freq: Once | INTRAVENOUS | Status: AC | PRN
  Administered 2022-03-02: 7.5 mL via INTRAVENOUS

## 2022-03-03 ENCOUNTER — Telehealth: Payer: Self-pay | Admitting: Nurse Practitioner

## 2022-03-03 ENCOUNTER — Other Ambulatory Visit: Payer: Self-pay | Admitting: Nurse Practitioner

## 2022-03-03 DIAGNOSIS — I6789 Other cerebrovascular disease: Secondary | ICD-10-CM

## 2022-03-03 NOTE — Telephone Encounter (Signed)
Dr. Brett Fairy focuses on sleep apnea  Will most likely need to see a different neurologist at [facility]

## 2022-03-03 NOTE — Addendum Note (Signed)
Addended by: Lorenda Peck E on: 03/03/2022 02:39 PM   Modules accepted: Orders

## 2022-03-03 NOTE — Telephone Encounter (Signed)
Would like to be ref. [facility] Dr. Beacher May is his first choice.

## 2022-03-07 NOTE — Progress Notes (Signed)
Assessment and Plan:  KIPLING GRASER was seen today for a follow up.  Diagnoses and all order for this visit:  1. Tick bite of abdomen, initial encounter Site WNL - no changes to skin.  - B. Burgdorfi Antibodies  2. Cognitive and behavioral changes MRI 03/02/22; No evidence of recent infarction, hemorrhage, or mass. Advanced chronic microvascular ischemic changes. Parenchymal volume loss without lobar predilection. Continue referral to Neurology.  3. Dizziness May benefit from PT if s/s fail to improve. Change positions slowly. Stay well hydrated.  4. Migraine with aura and with status migrainosus, not intractable Improved with Verapamil. Continue to monitor  Orders Placed This Encounter  Procedures   B. Burgdorfi Antibodies   Notify office for further evaluation and treatment, questions or concerns if s/s fail to improve. The risks and benefits of my recommendations, as well as other treatment options were discussed with the patient today. Questions were answered.  Further disposition pending results of labs. Discussed med's effects and SE's.    Over 20 minutes of exam, counseling, chart review, and critical decision making was performed.   Future Appointments  Date Time Provider Department Center  03/25/2022 11:00 AM [name], MD [facility] None  12/06/2022 10:00 AM Raynelle Dick, NP [facility] None    ------------------------------------------------------------------------------------------------------------------   HPI BP (!) 144/84   Pulse 87   Temp (!) 97.5 F (36.4 C)   Ht 5\' 10"  (1.778 m)   Wt 166 lb 12.8 oz (75.7 kg)   SpO2 97%   BMI 23.93 kg/m   76 y.o.male presents with wife for follow up on recent cognitive changes and decline.  Wife feels as though the changes are abrupt and with quick onset.  She noticed a significant change in his memory approximately 2 weeks ago.  He is unable to log into MyChart without needing assistance with  password or navigation.  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  Wife states he is always helping the neighbors out with there issues and this issue is "unlike him."  Patient states that he has noticed mild hallucination of someone standing to the right of him but when he turns to see who it is, not one is there.  He continues to have pain in the right ear and right temporal area.  MRI on 03/02/2022 revealed advanced changes with parenchymal volume loss without lobar predilection.  He has a referral placed with Neurology.    Wife reports that he had x2 tick bites, one on the abdomen and one of the RLE below the knee about 2 weeks ago.  She feels as though the tick bite could be a possible reason for the onset of sudden memory changes.    During last OV 03/01/22 he was asked to stop Topiramate and take Verapamil only.  He has been compliant and reports effectiveness.  His HA's have diminished in frequency and intensity.    He is continuing to feel dizzy.  Reports this has been an ongoing issue since high school.  Associated tinnitus.  Denies fall.  Does not want to take Meclizine d/t SE of drowsiness.    Past Medical History:  Diagnosis Date   Anemia    low iron   Arthritis    Cancer (HCC)    skin cancer on face   Cataracts, bilateral    Coronary artery disease    CABG - 2008   Diabetes mellitus without complication (HCC)    type II   GERD (gastroesophageal reflux disease)  Hepatitis    Hyperlipidemia    Hypertension    pt states he does not have HTN, takes Atenolol for migraines   Hypothyroidism    Migraine headache with aura    takes Atenolol   Sleep apnea    does not use cpap     Allergies  Allergen Reactions   Gemfibrozil Other (See Comments)    Muscle aches   Ace Inhibitors Cough   Lipitor [Atorvastatin] Other (See Comments)    Joint pain.    Current Outpatient Medications on File Prior to Visit  Medication Sig   acyclovir (ZOVIRAX) 800 MG tablet TAKE 1 TABLET DAILY AS  NEEDED FOR FEVER BLISTER / MOUTH ULCERS   aspirin EC 81 MG tablet Take 1 tablet (81 mg total) by mouth daily.   Lexicographer BLOOD SUGAR ONCE A DAY   Blood Glucose Monitoring Suppl DEVI Use kit to check blood sugar once a day   Cholecalciferol 5000 UNITS TABS Take 1 tablet by mouth daily.   Cyanocobalamin (B-12) 1000 MCG SUBL Place 1 tablet under the tongue daily. 2500 mcg  every other day   famotidine (PEPCID) 20 MG tablet Take 1 tablet (20 mg total) by mouth 2 (two) times daily.   glucose blood (CONTOUR NEXT TEST) test strip USE TO TEST BLOOD SUGAR ONCE DAILY   Lancets MISC Use to test blood sugar once daily   levothyroxine (SYNTHROID) 50 MCG tablet TAKE 1 TABLET BY MOUTH DAILY ON AN EMPTY STOMACH WITH ONLY WATER FOR 30 MINUTES & NO ANTACID MEDS, CALCIUM OR MAGNESIUM FOR 4 HOURS & AVOID BIOTIN   metFORMIN (GLUCOPHAGE-XR) 500 MG 24 hr tablet TAKE 2 TABLETS 2 TIMES PER DAY WITH MEALS FOR DIABETES   Multiple Vitamin (MULTIVITAMIN) tablet Take 1 tablet by mouth at bedtime.    Omega-3 Fatty Acids (FISH OIL) 1000 MG CAPS Take 1,000 mg by mouth at bedtime.    rosuvastatin (CRESTOR) 10 MG tablet TAKE 1 TABLET 2 TIMES PER WEEK FOR CHOLESTEROL   verapamil (CALAN-SR) 240 MG CR tablet Take 1 tablet every Morning with Food  for BP & Migraine Prevention   ezetimibe (ZETIA) 10 MG tablet Take 1 tablet (10 mg total) by mouth daily.   triamcinolone (NASACORT) 55 MCG/ACT AERO nasal inhaler Place 1 spray into the nose daily as needed.   No current facility-administered medications on file prior to visit.    ROS: all negative except what is noted in the HPI.   Physical Exam:  BP (!) 144/84   Pulse 87   Temp (!) 97.5 F (36.4 C)   Ht $R'5\' 10"'KJ$  (1.778 m)   Wt 166 lb 12.8 oz (75.7 kg)   SpO2 97%   BMI 23.93 kg/m   General Appearance: NAD.  Awake, conversant and cooperative. Eyes: PERRLA, EOMs intact.  Sclera white.  Conjunctiva without erythema. Sinuses: No frontal/maxillary  tenderness.  No nasal discharge. Nares patent.  ENT/Mouth: Ext aud canals clear.  Bilateral TMs w/DOL and without erythema or bulging. Hearing intact.  Posterior pharynx without swelling or exudate.  Tonsils without swelling or erythema.  Neck: Supple.  No masses, nodules or thyromegaly. Respiratory: Effort is regular with non-labored breathing. Breath sounds are equal bilaterally without rales, rhonchi, wheezing or stridor.  Cardio: RRR with no MRGs. Brisk peripheral pulses without edema.  Abdomen: Active BS in all four quadrants.  Soft and non-tender without guarding, rebound tenderness, hernias or masses. Lymphatics: Non tender without lymphadenopathy.  Musculoskeletal: Full ROM, 5/5 strength, normal  ambulation.  No clubbing or cyanosis. Skin: Appropriate color for ethnicity. Warm without rashes, lesions, ecchymosis, ulcers.  Neuro: CN II-XII grossly normal. Normal muscle tone without cerebellar symptoms and intact sensation.   Psych: AO X 3,  appropriate mood and affect, insight and judgment.     Darrol Jump, NP 11:56 AM [facility]

## 2022-03-08 ENCOUNTER — Encounter: Payer: Self-pay | Admitting: Nurse Practitioner

## 2022-03-08 ENCOUNTER — Encounter: Payer: Medicare Other | Admitting: Internal Medicine

## 2022-03-08 ENCOUNTER — Ambulatory Visit (INDEPENDENT_AMBULATORY_CARE_PROVIDER_SITE_OTHER): Payer: Medicare Other | Admitting: Nurse Practitioner

## 2022-03-08 VITALS — BP 144/84 | HR 87 | Temp 97.5°F | Ht 70.0 in | Wt 166.8 lb

## 2022-03-08 DIAGNOSIS — R4189 Other symptoms and signs involving cognitive functions and awareness: Secondary | ICD-10-CM

## 2022-03-08 DIAGNOSIS — W57XXXA Bitten or stung by nonvenomous insect and other nonvenomous arthropods, initial encounter: Secondary | ICD-10-CM | POA: Diagnosis not present

## 2022-03-08 DIAGNOSIS — I25810 Atherosclerosis of coronary artery bypass graft(s) without angina pectoris: Secondary | ICD-10-CM | POA: Diagnosis not present

## 2022-03-08 DIAGNOSIS — S30861A Insect bite (nonvenomous) of abdominal wall, initial encounter: Secondary | ICD-10-CM

## 2022-03-08 DIAGNOSIS — R42 Dizziness and giddiness: Secondary | ICD-10-CM | POA: Diagnosis not present

## 2022-03-08 DIAGNOSIS — R4689 Other symptoms and signs involving appearance and behavior: Secondary | ICD-10-CM

## 2022-03-08 DIAGNOSIS — G43101 Migraine with aura, not intractable, with status migrainosus: Secondary | ICD-10-CM | POA: Diagnosis not present

## 2022-03-08 NOTE — Patient Instructions (Signed)
Lyme Disease Lyme disease is an infection that can affect many parts of the body, including the skin, joints, and nervous system. It is a bacterial infection that starts from the bite of an infected tick. Over time, the infection can worsen, and some of the symptoms are similar to the flu. If Lyme disease is not treated, it may cause joint pain, swelling, numbness, problems thinking, fatigue, muscle weakness, and other problems. What are the causes? This condition is caused by bacteria called Borrelia burgdorferi. You can get Lyme disease by being bitten by an infected tick. Only black-legged, or Ixodes, ticks that are infected with the bacteria can cause Lyme disease. The tick must be attached to your skin for a certain period of time to pass along the infection. This is usually 36-48 hours. Deer often carry infected ticks. What increases the risk? The following factors may make you more likely to develop this condition: Living in or visiting these areas in the [place]. The [place]. The [place]. Spending time in wooded or grassy areas. Being outdoors with exposed skin. Camping, gardening, hiking, fishing, hunting, or working outdoors. Failing to remove a tick from your skin. What are the signs or symptoms? Symptoms of this condition may include: Chills and fever. Headache. Fatigue. General achiness. Muscle pain. Joint pain, often in the knees. A round, red rash that surrounds the center of the tick bite. The center of the rash may be blood colored or have tiny blisters. Swollen lymph glands. Stiff neck. How is this diagnosed? This condition is diagnosed based on: Your symptoms and medical history. A physical exam. A blood test. How is this treated? The main treatment for this condition is antibiotic medicine, which is usually taken by mouth (orally). The length of treatment depends on how soon after a tick bite you begin taking the medicine. In some  cases, treatment is necessary for several weeks. If the infection is severe, antibiotics may need to be given through an IV that is inserted into one of your veins. Follow these instructions at home: Take over-the-counter and prescription medicines only as told by your health care provider. Finish all antibiotic medicine, even when you start to feel better. Ask your health care provider about taking a probiotic in between doses of your antibiotic to help avoid an upset stomach or diarrhea. Check with your health care provider before supplementing your treatment. Many alternative therapies have not been proven and may be harmful to you. Keep all follow-up visits as told by your health care provider. This is important. How is this prevented? You can become reinfected if you get another tick bite from an infected tick. Take these steps to help prevent an infection: Cover your skin with light-colored clothing when you are outdoors in the spring and summer months. Spray clothing and skin with bug spray. The spray should be 20-30% DEET. You can also treat clothing with permethrin, and let it dry before you wear it. Do not apply permethrin directly to your skin. Permethrin can also be used to treat camping gear and boots. Always read and follow the instructions that come with a bug spray or insecticide. Avoid wooded, grassy, and shaded areas. Remove yard litter, brush, trash, and plants that attract deer and rodents. Check yourself for ticks when you come indoors. Wash clothing worn each day. Shower after spending time outdoors. Check your pets for ticks before they come inside. If you find a tick attached to your skin: Remove it with tweezers.  Clean your hands and the bite area with rubbing alcohol or soap and water. Dispose of the tick by putting it in rubbing alcohol, putting it in a sealed bag or container, or flushing it down the toilet. You may choose to save the tick in a sealed container if you  wish for it to be tested at a later time. Pregnant women should take special care to avoid tick bites because it is possible that the infection may be passed along to the fetus. Contact a health care provider if: You have symptoms after treatment. You have removed a tick and want to bring it to your health care provider for testing. Get help right away if: You have an irregular heartbeat. You have chest pain. You have nerve pain. Your face feels numb. You develop the following: A stiff neck. A severe headache. Severe nausea and vomiting. Sensitivity to light. Summary Lyme disease is an infection that can affect many parts of the body, including the skin, joints, and nervous system. This condition is caused by bacteria called Borrelia burgdorferi. You can get Lyme disease by being bitten by an infected tick. The main treatment for this condition is antibiotic medicine. This information is not intended to replace advice given to you by your health care provider. Make sure you discuss any questions you have with your health care provider. Document Revised: 10/12/2018 Document Reviewed: 09/06/2018 Elsevier Patient Education  [employer].

## 2022-03-09 ENCOUNTER — Telehealth: Payer: Self-pay

## 2022-03-09 ENCOUNTER — Encounter: Payer: Self-pay | Admitting: Neurology

## 2022-03-09 ENCOUNTER — Ambulatory Visit (INDEPENDENT_AMBULATORY_CARE_PROVIDER_SITE_OTHER): Payer: Medicare Other | Admitting: Neurology

## 2022-03-09 VITALS — BP 158/71 | HR 84 | Ht 70.0 in | Wt 165.0 lb

## 2022-03-09 DIAGNOSIS — R42 Dizziness and giddiness: Secondary | ICD-10-CM

## 2022-03-09 DIAGNOSIS — R4189 Other symptoms and signs involving cognitive functions and awareness: Secondary | ICD-10-CM

## 2022-03-09 LAB — B. BURGDORFI ANTIBODIES: B burgdorferi Ab IgG+IgM: <0.9 index

## 2022-03-09 NOTE — Progress Notes (Signed)
[facility]  PATIENT: Joshua JAGIELLO DOB: June 21, 1946  REQUESTING CLINICIAN: Alycia Rossetti, NP HISTORY FROM: Patient and spouse  REASON FOR VISIT: Dizziness/Abnormal MRI    HISTORICAL  CHIEF COMPLAINT:  Chief Complaint  Patient presents with   New Patient (Initial Visit)    Rm 12 with wife. Reports he is here to discuss most recent MRI he has notice some increase dizziness and this has been troublesome for him.     HISTORY OF PRESENT ILLNESS:  This is a 76 year old gentleman past medical history of hypertension, hyperlipidemia, diabetes mellitus, migraine headaches and hypothyroidism who is presenting with complaint of dizziness and cognitive impairment.  In terms of the dizziness he reported he is dizzy all the time, described as woozy feeling, denies any room spinning sensation.  So far no falls.  On top of that patient reported that he presented to the primary care doctor due to episode of seeing flashes of light and associated with headache and there was one episode with loss of vision momentarily.  The episodes was thought to be secondary to migraine headaches and he was prescribed verapamil and Topamax.  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  He also had difficulty with his username and password he was not able to use his laptop or a tablet.  He presented to his doctors again on 8/29 and at that time topiramate was discontinued and he continues on verapamil.  He reported symptoms are improved but is still present.  He is still independent in all ADLs.       OTHER MEDICAL CONDITIONS: Hypertension, hyperlipidemia, Migraines headaches, Hypothyroidism   REVIEW OF SYSTEMS: Full 14 system review of systems performed and negative with exception of: As noted in the HPI   ALLERGIES: Allergies  Allergen Reactions   Gemfibrozil Other (See Comments)     Muscle aches   Ace Inhibitors Cough   Lipitor [Atorvastatin] Other (See Comments)    Joint pain.    HOME MEDICATIONS: Outpatient Medications Prior to Visit  Medication Sig Dispense Refill   acyclovir (ZOVIRAX) 800 MG tablet TAKE 1 TABLET DAILY AS NEEDED FOR FEVER BLISTER / MOUTH ULCERS 90 tablet 0   aspirin EC 81 MG tablet Take 1 tablet (81 mg total) by mouth daily. 90 tablet 3   Bayer Microlet Lancets lancets CHECK BLOOD SUGAR ONCE A DAY 100 each 0   Blood Glucose Monitoring Suppl DEVI Use kit to check blood sugar once a day 1 each 0   Cholecalciferol 5000 UNITS TABS Take 1 tablet by mouth daily.     Cyanocobalamin (B-12) 1000 MCG SUBL Place 1 tablet under the tongue daily. 2500 mcg  every other day     famotidine (PEPCID) 20 MG tablet Take 1 tablet (20 mg total) by mouth 2 (two) times daily. 180 tablet 1   glucose blood (CONTOUR NEXT TEST) test strip USE TO TEST BLOOD SUGAR ONCE DAILY 100 strip 12   Lancets MISC Use to test blood sugar once daily 200 each 3   levothyroxine (SYNTHROID) 50 MCG tablet TAKE 1 TABLET BY MOUTH DAILY ON AN EMPTY STOMACH WITH ONLY WATER FOR 30 MINUTES & NO ANTACID MEDS, CALCIUM OR MAGNESIUM FOR 4 HOURS & AVOID BIOTIN 90 tablet 3   metFORMIN (GLUCOPHAGE-XR) 500 MG 24 hr tablet TAKE 2 TABLETS 2 TIMES PER DAY WITH MEALS FOR DIABETES 360 tablet 1   Multiple Vitamin (  MULTIVITAMIN) tablet Take 1 tablet by mouth at bedtime.      Omega-3 Fatty Acids (FISH OIL) 1000 MG CAPS Take 1,000 mg by mouth at bedtime.      rosuvastatin (CRESTOR) 10 MG tablet TAKE 1 TABLET 2 TIMES PER WEEK FOR CHOLESTEROL 26 tablet 3   verapamil (CALAN-SR) 240 MG CR tablet Take 1 tablet every Morning with Food  for BP & Migraine Prevention 90 tablet 3   ezetimibe (ZETIA) 10 MG tablet Take 1 tablet (10 mg total) by mouth daily. 90 tablet 3   triamcinolone (NASACORT) 55 MCG/ACT AERO nasal inhaler Place 1 spray into the nose daily as needed. 1 each 2   No facility-administered medications prior to  visit.    PAST MEDICAL HISTORY: Past Medical History:  Diagnosis Date   Anemia    low iron   Arthritis    Cancer ([place])    skin cancer on face   Cataracts, bilateral    Coronary artery disease    CABG - 2008   Diabetes mellitus without complication (HCC)    type II   GERD (gastroesophageal reflux disease)    Hepatitis    Hyperlipidemia    Hypertension    pt states he does not have HTN, takes Atenolol for migraines   Hypothyroidism    Migraine headache with aura    takes Atenolol   Sleep apnea    does not use cpap    PAST SURGICAL HISTORY: Past Surgical History:  Procedure Laterality Date   BACK SURGERY     CARDIAC SURGERY     Bypass   CATARACT EXTRACTION, BILATERAL Bilateral 2018   Dr. Gershon Crane   COLONOSCOPY     CORONARY ANGIOPLASTY  2001   CORONARY ARTERY BYPASS GRAFT     ELBOW SURGERY Left    due to damaged nerve   KNEE SURGERY Right    arthroscopic   LUMBAR LAMINECTOMY/DECOMPRESSION MICRODISCECTOMY Right 08/04/2014   Procedure: Right Lumbar four-five Microdiskectomy;  Surgeon: Kristeen Miss, MD;  Location: [facility];  Service: Neurosurgery;  Laterality: Right;  Right L4-5 Microdiskectomy   SURGERY SCROTAL / TESTICULAR     as a child/teenager    FAMILY HISTORY: Family History  Problem Relation Age of Onset   Ulcers Mother    Arthritis Mother    Heart attack Father    CAD Sister    Heart attack Brother    CAD Brother    Hypertension Brother    Diabetes Maternal Grandmother     SOCIAL HISTORY: Social History   Socioeconomic History   Marital status: Married    Spouse name: Not on file   Number of children: Not on file   Years of education: Not on file   Highest education level: Not on file  Occupational History   Not on file  Tobacco Use   Smoking status: Former    Types: Cigarettes    Quit date: 10/01/1973    Years since quitting: 48.4   Smokeless tobacco: Never  Substance and Sexual Activity   Alcohol use: No   Drug use: No   Sexual  activity: Not on file  Other Topics Concern   Not on file  Social History Narrative   Not on file   Social Determinants of Health   Financial Resource Strain: Not on file  Food Insecurity: Not on file  Transportation Needs: Not on file  Physical Activity: Not on file  Stress: Not on file  Social Connections: Not on file  Intimate  Partner Violence: Not on file    PHYSICAL EXAM  GENERAL EXAM/CONSTITUTIONAL: Vitals:  Vitals:   03/09/22 0958  BP: (!) 158/71  Pulse: 84  Weight: 165 lb (74.8 kg)  Height: $Remove'5\' 10"'[id number]$  (1.778 m)   Body mass index is 23.68 kg/m. Wt Readings from Last 3 Encounters:  03/09/22 165 lb (74.8 kg)  03/08/22 166 lb 12.8 oz (75.7 kg)  03/01/22 167 lb 6.4 oz (75.9 kg)   Patient is in no distress; well developed, nourished and groomed; neck is supple  EYES: Pupils round and reactive to light, Visual fields full to confrontation, Extraocular movements intacts,   MUSCULOSKELETAL: Gait, strength, tone, movements noted in Neurologic exam below  NEUROLOGIC: MENTAL STATUS:      No data to display         awake, alert, oriented to person, place and time recent and remote memory intact normal attention and concentration language fluent, comprehension intact, naming intact fund of knowledge appropriate  CRANIAL NERVE:  2nd, 3rd, 4th, 6th - pupils equal and reactive to light, visual fields full to confrontation, extraocular muscles intact, no nystagmus 5th - facial sensation symmetric 7th - facial strength symmetric 8th - hearing intact 9th - palate elevates symmetrically, uvula midline 11th - shoulder shrug symmetric 12th - tongue protrusion midline  MOTOR:  normal bulk and tone, full strength in the BUE, BLE  SENSORY:  normal and symmetric to light touch  COORDINATION:  finger-nose-finger, fine finger movements normal  REFLEXES:  deep tendon reflexes present and symmetric  GAIT/STATION:  normal   DIAGNOSTIC DATA (LABS, IMAGING,  TESTING) - I reviewed patient records, labs, notes, testing and imaging myself where available.  Lab Results  Component Value Date   WBC 5.3 01/23/2022   HGB 12.6 (L) 01/23/2022   HCT 38.5 (L) 01/23/2022   MCV 102.4 (H) 01/23/2022   PLT 174 01/23/2022      Component Value Date/Time   NA 141 01/23/2022 2254   K 4.2 01/23/2022 2254   CL 103 01/23/2022 2254   CO2 29 01/23/2022 2254   GLUCOSE 103 (H) 01/23/2022 2254   BUN 18 01/23/2022 2254   CREATININE 0.96 01/23/2022 2254   CREATININE 0.97 12/16/2021 0930   CALCIUM 10.3 01/23/2022 2254   PROT 7.3 12/03/2021 0957   ALBUMIN 4.3 02/14/2017 1214   AST 17 12/03/2021 0957   ALT 14 12/03/2021 0957   ALKPHOS 53 02/14/2017 1214   BILITOT 0.8 12/03/2021 0957   GFRNONAA >60 01/23/2022 2254   GFRNONAA 72 10/22/2020 1213   GFRAA 83 10/22/2020 1213   Lab Results  Component Value Date   CHOL 117 12/03/2021   HDL 53 12/03/2021   LDLCALC 47 12/03/2021   TRIG 89 12/03/2021   CHOLHDL 2.2 12/03/2021   Lab Results  Component Value Date   HGBA1C 6.1 (H) 12/03/2021   Lab Results  Component Value Date   [id number] 102 08/24/2021   Lab Results  Component Value Date   TSH 1.70 12/03/2021    MRI Brain 03/02/22 No evidence of recent infarction, hemorrhage, or mass. Advanced chronic microvascular ischemic changes. Parenchymal volume loss without lobar predilection.    ASSESSMENT AND PLAN  76 y.o. year old male with history of hypertension, hyperlipidemia, diabetes mellitus, hypothyroidism, who is presenting with complaint of dizziness and new cognitive problems.  In term of the dizziness, MRI brain did not show any acute infarct, no tumor, no central etiology of dizziness.  I did advise patient to increase his fluid intake.  On  exam he is gait was normal even though he cannot tandem. In terms of the cognitive impairment, I informed patient and wife that this was most likely a side effect from his topiramate.  This medication has been  discontinued, I advised him to continue monitoring his symptoms and if there are no improvement within 6 weeks to 2 months to please call the office and make an appointment.  They are comfortable with plan.  Continue to follow with your PCP and return as needed. We also spent additional time, discussing the MRI Brain findings. All questions answered.    1. Cognitive change   2. Dizziness      Patient Instructions  Continue current medications If cognitive complaints are not improve within 2 months please call the office to make an appointment Continue follow-up PCP Return as needed  No orders of the defined types were placed in this encounter.   No orders of the defined types were placed in this encounter.   Return if symptoms worsen or fail to improve.    Alric Ran, MD 03/09/2022, 4:37 PM  [facility] [address] [place], [place] [address] [phone]

## 2022-03-09 NOTE — Patient Instructions (Signed)
Continue current medications If cognitive complaints are not improve within 2 months please call the office to make an appointment Continue follow-up PCP Return as needed

## 2022-03-09 NOTE — Telephone Encounter (Addendum)
Error

## 2022-03-10 ENCOUNTER — Other Ambulatory Visit: Payer: Self-pay

## 2022-03-10 DIAGNOSIS — E1122 Type 2 diabetes mellitus with diabetic chronic kidney disease: Secondary | ICD-10-CM

## 2022-03-10 MED ORDER — LANCETS MISC: 3 refills | Status: DC

## 2022-03-19 ENCOUNTER — Other Ambulatory Visit: Payer: Self-pay | Admitting: Nurse Practitioner

## 2022-03-22 DIAGNOSIS — C44329 Squamous cell carcinoma of skin of other parts of face: Secondary | ICD-10-CM | POA: Diagnosis not present

## 2022-03-22 DIAGNOSIS — D485 Neoplasm of uncertain behavior of skin: Secondary | ICD-10-CM | POA: Diagnosis not present

## 2022-03-22 DIAGNOSIS — C44229 Squamous cell carcinoma of skin of left ear and external auricular canal: Secondary | ICD-10-CM | POA: Diagnosis not present

## 2022-03-24 ENCOUNTER — Encounter: Payer: Self-pay | Admitting: Internal Medicine

## 2022-03-24 NOTE — Progress Notes (Signed)
Comprehensive Evaluation & Examination   Future Appointments  Date Time Provider Department  03/25/2022                    cpe 11:00 AM Joshua Pinto, MD [facility]  12/06/2022                      wellness 10:00 AM Joshua Rossetti, NP [facility]  03/30/2023                    cpe 11:00 AM Joshua Pinto, MD [facility]             This very nice 76 y.o.  MWM presents for a comprehensive evaluation and management of multiple medical co-morbidities.  Patient has been followed for HTN, [name], [name], Hypothyroidism,  [name]  Patient has aortic atherosclerosis by CXR.       HTN predates since 1989 . Patient's BP has been controlled at home.  Today's BP is at goal - 118/70.     In 2001, Patient had PCA/ZStent & in 2008 underwent CABG. In [address]017 , he had a negative ETT by Dr Sallyanne Kuster. Patient denies any cardiac symptoms as chest pain, palpitations, shortness of breath, dizziness or ankle swelling.       Patient's hyperlipidemia is controlled with diet and Rosuvastatin.  Patient denies myalgias or other medication SE's. Last lipids were at goalexcept elevated Trig's:  Lab Results  Component Value Date   CHOL 117 12/03/2021   HDL 53 12/03/2021   LDLCALC 47 12/03/2021   TRIG 89 12/03/2021   CHOLHDL 2.2 12/03/2021                                            [name]       [name], visual blurring, diabetic polys or paresthesias. Last A1c was still not at goal:   Lab Results  Component Value Date   HGBA1C 6.1 (H) 12/03/2021        Finally, patient has history of Vitamin D Deficiency ("33" /2008) and last vitamin D was near goal:   Lab Results  Component Value Date   VD25OH 64 02/25/2021         [name]    Current Outpatient  Medications on File Prior to Visit  Medication Sig   acyclovir 800 MG tablet Take 1 tablet as needed   aspirin EC 81 MG tablet Take 1 tablet  daily.   atenolol ( 50 MG tablet TAKE 1 TABLET DAILY    Cholecalciferol 5000 u Take 1 tablet daily.   B-12 1000 MCG SUBL Place 1 tablet under the tongue daily.   famotidine  20 MG tablet TAKE 1 TABLET TWICE A DAY   Zetia  10 mg  TAKE 1 TABLET DAILY    metFORMIN -500 XR  TAKE 2 TABS 2 x /DAY WITH MEALS    Multiple Vitamin  Take 1 tablet  at bedtime.    Omega-3 FISH OIL 1000 MG  Take  at bedtime.    rosuvastatin  10 MG tablet TAKE 1 TABLET 2 X /WEEK    NASACORT  nasal inhaler Place 1  spray into the nose daily as needed.    Allergies  Allergen Reactions   Gemfibrozil Other (See Comments)    Muscle aches   Ace Inhibitors Cough   Lipitor [Atorvastatin] Other (See Comments)    Joint pain.    Past Medical History:  Diagnosis Date   Anemia    low iron   Arthritis    Cancer ([place])    skin cancer on face   Cataracts, bilateral    Coronary artery disease    CABG - 2008   Diabetes mellitus without complication ([place])    type II   GERD (gastroesophageal reflux disease)    Hepatitis    Hyperlipidemia    Hypertension    pt states he does not have HTN, takes Atenolol for migraines   Hypothyroidism    Migraine headache with aura    takes Atenolol   Sleep apnea    does not use cpap     Health Maintenance  Topic Date Due   FOOT EXAM  12/22/2020   COVID-19 Vaccine (5 - Booster for Moderna series) 02/20/2021   INFLUENZA VACCINE  02/01/2021   HEMOGLOBIN A1C  04/23/2021   OPHTHALMOLOGY EXAM  05/07/2021   TETANUS/TDAP  06/03/2025   Hepatitis C Screening  Completed   PNA vac Low Risk Adult  Completed   Zoster Vaccines- Shingrix  Completed   HPV VACCINES  Aged Out     Immunization History  Administered Date(s) Administered   DT (Pediatric) 08/29/2003, 06/04/2015   Influenza Split 02/21/2019   Influenza, High Dose Seasonal  04/03/2018,  02/21/2019, 03/05/2020   Influenza 04/16/2014, 03/30/2016, 04/02/2017   Moderna SARS-COV2 Booster Vacc 10/21/2020   Moderna Sars-Covid-2 Vaccination 07/16/2019, 08/16/2019, 04/27/2020   Pneumococcal -13 12/26/2013   Pneumococcal -23 01/29/2010   Zoster Recombinat (Shingrix) 01/31/2017, 07/05/2017, 08/09/2017   Zoster, Live 05/11/2010    Last Colon -  09/07/2016 - Neg - recommended no Repeat Colon due to age  Past Surgical History:  Procedure Laterality Date   BACK SURGERY     CARDIAC SURGERY     Bypass   CATARACT EXTRACTION, BILATERAL Bilateral 2018   Dr. Gershon Crane   COLONOSCOPY     CORONARY ANGIOPLASTY  2001   CORONARY ARTERY BYPASS GRAFT     ELBOW SURGERY Left    due to damaged nerve   KNEE SURGERY Right    arthroscopic   LUMBAR LAMINECTOMY/DECOMPRESSION MICRODISCECTOMY Right 08/04/2014   Procedure: Right Lumbar four-five Microdiskectomy;  Surgeon: Kristeen Miss, MD;  Location: [facility];  Service: Neurosurgery;  Laterality: Right;  Right L4-5 Microdiskectomy   SURGERY SCROTAL / TESTICULAR     as a child/teenager     Family History  Problem Relation Age of Onset   Ulcers Mother    Arthritis Mother    Heart attack Father    CAD Sister    Heart attack Brother    CAD Brother    Hypertension Brother    Diabetes Maternal Grandmother     Social History   Socioeconomic History   Marital status: Married    Spouse name: Not on file   Number of children: Not on file   Years of education: Not on file   Highest education level: Not on file  Occupational History   Not on file  Tobacco Use   Smoking status: Former    Types: Cigarettes    Quit date: 10/01/1973    Years since quitting: 47.4   Smokeless tobacco: Never  Substance and Sexual Activity  Alcohol use: No   Drug use: No   Sexual activity: Not on file      ROS Constitutional: Denies fever, chills, weight loss/gain, headaches, insomnia,  night sweats or change in appetite. Does c/o fatigue. Eyes: Denies  redness, blurred vision, diplopia, discharge, itchy or watery eyes.  ENT: Denies discharge, congestion, post nasal drip, epistaxis, sore throat, earache, hearing loss, dental pain, Tinnitus, Vertigo, Sinus pain or snoring.  Cardio: Denies chest pain, palpitations, irregular heartbeat, syncope, dyspnea, diaphoresis, orthopnea, PND, claudication or edema Respiratory: denies cough, dyspnea, DOE, pleurisy, hoarseness, laryngitis or wheezing.  Gastrointestinal: Denies dysphagia, heartburn, reflux, water brash, pain, cramps, nausea, vomiting, bloating, diarrhea, constipation, hematemesis, melena, hematochezia, jaundice or hemorrhoids Genitourinary: Denies dysuria, frequency, urgency, nocturia, hesitancy, discharge, hematuria or flank pain Musculoskeletal: Denies arthralgia, myalgia, stiffness, Jt. Swelling, pain, limp or strain/sprain. Denies Falls. Skin: Denies puritis, rash, hives, warts, acne, eczema or change in skin lesion Neuro: No weakness, tremor, incoordination, spasms, paresthesia or pain Psychiatric: Denies confusion, memory loss or sensory loss. Denies Depression. Endocrine: Denies change in weight, skin, hair change, nocturia, and paresthesia, diabetic polys, visual blurring or hyper / hypo glycemic episodes.  Heme/Lymph: No excessive bleeding, bruising or enlarged lymph nodes.   Physical Exam  BP 118/70   Pulse 89   Temp 97.9 F (36.6 C)   Resp 16   Ht '5\' 10"'$  (1.778 m)   Wt 167 lb 9.6 oz (76 kg)   SpO2 95%   BMI 24.05 kg/m   General Appearance: Well nourished and well groomed and in no apparent distress.  Eyes: PERRLA, EOMs, conjunctiva no swelling or erythema, normal fundi and vessels. Sinuses: No frontal/maxillary tenderness ENT/Mouth: EACs patent / TMs  nl. Nares clear without erythema, swelling, mucoid exudates. Oral hygiene is good. No erythema, swelling, or exudate. Tongue normal, non-obstructing. Tonsils not swollen or erythematous. Hearing normal.  Neck: Supple,  thyroid not palpable. No bruits, nodes or JVD. Respiratory: Respiratory effort normal.  BS equal and clear bilateral without rales, rhonci, wheezing or stridor. Cardio: Heart sounds are normal with regular rate and rhythm and no murmurs, rubs or gallops. Peripheral pulses are normal and equal bilaterally without edema. No aortic or femoral bruits. Chest: symmetric with normal excursions and percussion.  Abdomen: Soft, with Nl bowel sounds. Nontender, no guarding, rebound, hernias, masses, or organomegaly.  Lymphatics: Non tender without lymphadenopathy.  Musculoskeletal: Full ROM all peripheral extremities, joint stability, 5/5 strength, and normal gait. Skin: Warm and dry without rashes, lesions, cyanosis, clubbing or  ecchymosis.  Neuro: Cranial nerves intact, reflexes equal bilaterally. Normal muscle tone, no cerebellar symptoms. Sensation intact.  Pysch: Alert and oriented X 3 with normal affect, insight and judgment appropriate.   Assessment and Plan   1. Essential hypertension  - [place], RETROPERITNL ABD,  LTD - Urinalysis, Routine w reflex microscopic - Microalbumin / creatinine urine ratio - CBC with Differential/Platelet - COMPLETE METABOLIC PANEL WITH GFR - Magnesium - TSH  2. Hyperlipidemia associated with type 2 diabetes mellitus (HCC)  - [place], RETROPERITNL ABD,  LTD - Lipid panel - TSH  3. Type 2 diabetes mellitus with stage 2 chronic kidney  disease, without long-term current use of insulin (HCC)  - [place], RETROPERITNL ABD,  LTD - HM DIABETES FOOT EXAM - PR LOW EXTEMITY NEUR EXAM DOCUM - Hemoglobin A1c - Insulin, random  4. Coronary artery disease involving coronary bypass  graft of native heart without angina pectoris  - Lipid panel  5. Atherosclerosis of abdominal aorta ([place]) by CXR  in 2017   - [place], RETROPERITNL ABD,  LTD - Lipid panel  6. Vitamin D deficiency  - VITAMIN D 25 Hydroxy (Vit-D Deficiency, Fractures)  7. Hypothyroidism, unspecified type  -  TSH  8. Gastroesophageal reflux disease  - CBC with Differential/Platelet  9. B12 deficiency  - Vitamin B12 - CBC with Differential/Platelet  10. BPH with obstruction/lower urinary tract symptoms  - PSA  11. Screening for colorectal cancer  - POC Hemoccult Bld/Stl   12. Prostate cancer screening  - PSA  13. Screening for heart disease   14. FHx: heart disease  - [place], RETROPERITNL ABD,  LTD  15. Former smoker  - [place], RETROPERITNL ABD,  LTD  16. Screening for AAA (aortic abdominal aneurysm)  - [place], RETROPERITNL ABD,  LTD  17. Medication management  - Urinalysis, Routine w reflex microscopic - Microalbumin / creatinine urine ratio - CBC with Differential/Platelet - Magnesium - Lipid panel - Hemoglobin A1c - Insulin, random - VITAMIN D 25 Hydroxy  - TSH       Patient was counseled in prudent diet, weight control to achieve/maintain BMI less than 25, BP monitoring, regular exercise and medications as discussed.  Discussed med effects and SE's. Routine screening labs and tests as requested with regular follow-up as recommended. Over 40 minutes of exam, counseling, chart review and high complex critical decision making was performed   Kirtland Bouchard, MD

## 2022-03-24 NOTE — Patient Instructions (Signed)

## 2022-03-25 ENCOUNTER — Ambulatory Visit (INDEPENDENT_AMBULATORY_CARE_PROVIDER_SITE_OTHER): Payer: Medicare Other | Admitting: Internal Medicine

## 2022-03-25 ENCOUNTER — Encounter: Payer: Self-pay | Admitting: Internal Medicine

## 2022-03-25 VITALS — BP 118/70 | HR 89 | Temp 97.9°F | Resp 16 | Ht 70.0 in | Wt 167.6 lb

## 2022-03-25 DIAGNOSIS — N401 Enlarged prostate with lower urinary tract symptoms: Secondary | ICD-10-CM | POA: Diagnosis not present

## 2022-03-25 DIAGNOSIS — Z79899 Other long term (current) drug therapy: Secondary | ICD-10-CM

## 2022-03-25 DIAGNOSIS — I700 Atherosclerosis of aorta: Secondary | ICD-10-CM | POA: Diagnosis not present

## 2022-03-25 DIAGNOSIS — E039 Hypothyroidism, unspecified: Secondary | ICD-10-CM | POA: Diagnosis not present

## 2022-03-25 DIAGNOSIS — N138 Other obstructive and reflux uropathy: Secondary | ICD-10-CM | POA: Diagnosis not present

## 2022-03-25 DIAGNOSIS — Z1211 Encounter for screening for malignant neoplasm of colon: Secondary | ICD-10-CM

## 2022-03-25 DIAGNOSIS — E1122 Type 2 diabetes mellitus with diabetic chronic kidney disease: Secondary | ICD-10-CM | POA: Diagnosis not present

## 2022-03-25 DIAGNOSIS — Z125 Encounter for screening for malignant neoplasm of prostate: Secondary | ICD-10-CM

## 2022-03-25 DIAGNOSIS — Z87891 Personal history of nicotine dependence: Secondary | ICD-10-CM

## 2022-03-25 DIAGNOSIS — E1169 Type 2 diabetes mellitus with other specified complication: Secondary | ICD-10-CM

## 2022-03-25 DIAGNOSIS — I25810 Atherosclerosis of coronary artery bypass graft(s) without angina pectoris: Secondary | ICD-10-CM

## 2022-03-25 DIAGNOSIS — Z8249 Family history of ischemic heart disease and other diseases of the circulatory system: Secondary | ICD-10-CM | POA: Diagnosis not present

## 2022-03-25 DIAGNOSIS — E559 Vitamin D deficiency, unspecified: Secondary | ICD-10-CM

## 2022-03-25 DIAGNOSIS — Z136 Encounter for screening for cardiovascular disorders: Secondary | ICD-10-CM

## 2022-03-25 DIAGNOSIS — I10 Essential (primary) hypertension: Secondary | ICD-10-CM

## 2022-03-25 DIAGNOSIS — E538 Deficiency of other specified B group vitamins: Secondary | ICD-10-CM | POA: Diagnosis not present

## 2022-03-25 DIAGNOSIS — E785 Hyperlipidemia, unspecified: Secondary | ICD-10-CM | POA: Diagnosis not present

## 2022-03-25 DIAGNOSIS — K219 Gastro-esophageal reflux disease without esophagitis: Secondary | ICD-10-CM | POA: Diagnosis not present

## 2022-03-25 DIAGNOSIS — N182 Chronic kidney disease, stage 2 (mild): Secondary | ICD-10-CM

## 2022-03-26 NOTE — Progress Notes (Signed)
<><><><><><><><><><><><><><><><><><><><><><><><><><><><><><><><><> <><><><><><><><><><><><><><><><><><><><><><><><><><><><><><><><><> -   Test results slightly outside the reference range are not unusual. If there is anything important, I will review this with you,  otherwise it is considered normal test values.  If you have further questions,  please do not hesitate to contact me at the office or via My Chart.  <><><><><><><><><><><><><><><><><><><><><><><><><><><><><><><><><> <><><><><><><><><><><><><><><><><><><><><><><><><><><><><><><><><>  -  Vitamin B12 is Normal / OK  <><><><><><><><><><><><><><><><><><><><><><><><><><><><><><><><><> <><><><><><><><><><><><><><><><><><><><><><><><><><><><><><><><><>  -  A1c= 5.9%  - better  ( was 6.1% ), but  Blood sugar and A1c are                                     STILL elevated in the borderline and early or pre-diabetes range  which has the same 300% increased risk for                                      Heart attack, stroke, cancer and                                               Alzheimer- type vascular Dementia as full blown diabetes.   But the good news is that diet, exercise with weight loss can                                                                                  cure the early diabetes at this point. <><><><><><><><><><><><><><><><><><><><><><><><><><><><><><><><><> <><><><><><><><><><><><><><><><><><><><><><><><><><><><><><><><><>  -   PSA - is Low  - Great  !                                                                                                                                               <><><><><><><><><><><><><><><><><><><><><><><><><><><><><><><><><> <><><><><><><><><><><><><><><><><><><><><><><><><><><><><><><><><>  -  Total Chol = 111 and LDL Chol = 36 - Both  Excellent   - Very low risk for Heart Attack  /  Stroke <><><><><><><><><><><><><><><><><><><><><><><><><><><><><><><><><> <><><><><><><><><><><><><><><><><><><><><><><><><><><><><><><><><>  - Vitamin D = 67 - Excellent - Please keep dose same  !  <><><><><><><><><><><><><><><><><><><><><><><><><><><><><><><><><> <><><><><><><><><><><><><><><><><><><><><><><><><><><><><><><><><>  - All Else - CBC - Kidneys - Electrolytes - Liver - Magnesium & Thyroid    - all  Normal / OK <><><><><><><><><><><><><><><><><><><><><><><><><><><><><><><><><> <><><><><><><><><><><><><><><><><><><><><><><><><><><><><><><><><>

## 2022-03-28 LAB — VITAMIN B12: Vitamin B-12: 1913 pg/mL — ABNORMAL HIGH (ref 200–1100)

## 2022-03-28 LAB — LIPID PANEL
Cholesterol: 111 mg/dL (ref <200)
HDL: 60 mg/dL (ref >=40)
LDL Cholesterol (Calc): 36 mg/dL (calc)
Non-HDL Cholesterol (Calc): 51 mg/dL (calc) (ref <130)
Total CHOL/HDL Ratio: 1.9 (calc) (ref <5.0)
Triglycerides: 71 mg/dL (ref <150)

## 2022-03-28 LAB — URINALYSIS, ROUTINE W REFLEX MICROSCOPIC
Bilirubin Urine: NEGATIVE
Glucose, UA: NEGATIVE
Hgb urine dipstick: NEGATIVE
Ketones, ur: NEGATIVE
Leukocytes,Ua: NEGATIVE
Nitrite: NEGATIVE
Protein, ur: NEGATIVE
Specific Gravity, Urine: 1.005 (ref 1.001–1.035)
pH: 6 (ref 5.0–8.0)

## 2022-03-28 LAB — CBC WITH DIFFERENTIAL/PLATELET
Absolute Monocytes: 469 cells/uL (ref 200–950)
Basophils Absolute: 28 cells/uL (ref 0–200)
Basophils Relative: 0.6 %
Eosinophils Absolute: 129 cells/uL (ref 15–500)
Eosinophils Relative: 2.8 %
HCT: 39.6 % (ref 38.5–50.0)
Hemoglobin: 13.2 g/dL (ref 13.2–17.1)
Lymphs Abs: 1707 cells/uL (ref 850–3900)
MCH: 34 pg — ABNORMAL HIGH (ref 27.0–33.0)
MCHC: 33.3 g/dL (ref 32.0–36.0)
MCV: 102.1 fL — ABNORMAL HIGH (ref 80.0–100.0)
MPV: 9.5 fL (ref 7.5–12.5)
Monocytes Relative: 10.2 %
Neutro Abs: 2268 cells/uL (ref 1500–7800)
Neutrophils Relative %: 49.3 %
Platelets: 176 10*3/uL (ref 140–400)
RBC: 3.88 10*6/uL — ABNORMAL LOW (ref 4.20–5.80)
RDW: 11.9 % (ref 11.0–15.0)
Total Lymphocyte: 37.1 %
WBC: 4.6 10*3/uL (ref 3.8–10.8)

## 2022-03-28 LAB — COMPLETE METABOLIC PANEL WITH GFR
AG Ratio: 2 (calc) (ref 1.0–2.5)
ALT: 16 U/L (ref 9–46)
AST: 19 U/L (ref 10–35)
Albumin: 4.7 g/dL (ref 3.6–5.1)
Alkaline phosphatase (APISO): 50 U/L (ref 35–144)
BUN: 12 mg/dL (ref 7–25)
CO2: 28 mmol/L (ref 20–32)
Calcium: 9.7 mg/dL (ref 8.6–10.3)
Chloride: 102 mmol/L (ref 98–110)
Creat: 0.89 mg/dL (ref 0.70–1.28)
Globulin: 2.4 g/dL (calc) (ref 1.9–3.7)
Glucose, Bld: 87 mg/dL (ref 65–99)
Potassium: 4.9 mmol/L (ref 3.5–5.3)
Sodium: 140 mmol/L (ref 135–146)
Total Bilirubin: 0.6 mg/dL (ref 0.2–1.2)
Total Protein: 7.1 g/dL (ref 6.1–8.1)
eGFR: 88 mL/min/{1.73_m2} (ref >=60)

## 2022-03-28 LAB — TSH: TSH: 1.2 mIU/L (ref 0.40–4.50)

## 2022-03-28 LAB — MICROALBUMIN / CREATININE URINE RATIO
Creatinine, Urine: 37 mg/dL (ref 20–320)
Microalb Creat Ratio: 5 mcg/mg creat (ref <30)
Microalb, Ur: 0.2 mg/dL

## 2022-03-28 LAB — PSA: PSA: 0.95 ng/mL (ref <=4.00)

## 2022-03-28 LAB — HEMOGLOBIN A1C
Hgb A1c MFr Bld: 5.9 % of total Hgb — ABNORMAL HIGH (ref <5.7)
Mean Plasma Glucose: 123 mg/dL
eAG (mmol/L): 6.8 mmol/L

## 2022-03-28 LAB — INSULIN, RANDOM: Insulin: 15.9 u[IU]/mL

## 2022-03-28 LAB — MAGNESIUM: Magnesium: 1.8 mg/dL (ref 1.5–2.5)

## 2022-03-28 LAB — VITAMIN D 25 HYDROXY (VIT D DEFICIENCY, FRACTURES): Vit D, 25-Hydroxy: 67 ng/mL (ref 30–100)

## 2022-03-29 DIAGNOSIS — Z23 Encounter for immunization: Secondary | ICD-10-CM | POA: Diagnosis not present

## 2022-03-30 DIAGNOSIS — Z23 Encounter for immunization: Secondary | ICD-10-CM | POA: Diagnosis not present

## 2022-04-12 DIAGNOSIS — C44229 Squamous cell carcinoma of skin of left ear and external auricular canal: Secondary | ICD-10-CM | POA: Diagnosis not present

## 2022-04-18 ENCOUNTER — Telehealth (HOSPITAL_COMMUNITY): Payer: Self-pay | Admitting: Emergency Medicine

## 2022-04-18 NOTE — Telephone Encounter (Signed)
Reaching out to patient to offer assistance regarding upcoming cardiac imaging study; pt verbalizes understanding of appt date/time, parking situation and where to check in, pre-test NPO status and medications ordered, and verified current allergies; name and call back number provided for further questions should they arise Marchia Bond RN Navigator Cardiac Imaging [facility] [phone] office [phone] cell  Arrival 115, [facility] main entrance Difficult iv sometimes No caffeine 12 h No food 3 h

## 2022-04-19 ENCOUNTER — Ambulatory Visit (HOSPITAL_COMMUNITY)
Admission: RE | Admit: 2022-04-19 | Discharge: 2022-04-19 | Disposition: A | Discharge status: Home / Self Care | Payer: Medicare Other | Source: Ambulatory Visit | Attending: Cardiovascular Disease | Admitting: Cardiovascular Disease

## 2022-04-19 DIAGNOSIS — R072 Precordial pain: Secondary | ICD-10-CM | POA: Diagnosis not present

## 2022-04-19 MED ORDER — RUBIDIUM RB82 GENERATOR (RUBYFILL)
25.0000 | PACK | Freq: Once | INTRAVENOUS | Status: AC
  Administered 2022-04-19: 19.9 via INTRAVENOUS

## 2022-04-19 MED ORDER — REGADENOSON 0.4 MG/5ML IV SOLN: 0.4000 mg | Freq: Once | INTRAVENOUS | Status: AC

## 2022-04-19 MED ORDER — REGADENOSON 0.4 MG/5ML IV SOLN
INTRAVENOUS | Status: AC
  Administered 2022-04-19: 0.4 mg via INTRAVENOUS
  Filled 2022-04-19: qty 5

## 2022-04-19 MED ORDER — RUBIDIUM RB82 GENERATOR (RUBYFILL)
25.0000 | PACK | Freq: Once | INTRAVENOUS | Status: AC
  Administered 2022-04-19: 19.5 via INTRAVENOUS

## 2022-04-20 LAB — NM PET CT CARDIAC PERFUSION MULTI W/ABSOLUTE BLOODFLOW
LV dias vol: 99 mL (ref 62–150)
LV sys vol: 39 mL
Nuc Rest EF: 55 %
Nuc Stress EF: 61 %
Peak HR: 99 {beats}/min
Rest HR: 78 {beats}/min
Rest Nuclear Isotope Dose: 19.5 mCi
ST Depression (mm): 0 mm
Stress Nuclear Isotope Dose: 19.9 mCi
TID: 0.96

## 2022-04-22 ENCOUNTER — Telehealth: Payer: Self-pay | Admitting: Cardiovascular Disease

## 2022-04-22 NOTE — Telephone Encounter (Signed)
    There is only a very small area of abnormal blood flow to the heart muscle, in an area of the heart that is low risk. Heart catheterization is not necessary  Sanda Klein, MD

## 2022-04-22 NOTE — Telephone Encounter (Signed)
Spoke to patient  result given The patient has been notified of the result and verbalized understanding.  All questions (if any) were answered. Raiford Simmonds, RN 04/22/2022 8:54 AM

## 2022-04-22 NOTE — Telephone Encounter (Signed)
Patient called to get test results. 

## 2022-04-26 ENCOUNTER — Other Ambulatory Visit: Payer: Self-pay | Admitting: Nurse Practitioner

## 2022-04-27 DIAGNOSIS — R42 Dizziness and giddiness: Secondary | ICD-10-CM | POA: Insufficient documentation

## 2022-04-27 DIAGNOSIS — M542 Cervicalgia: Secondary | ICD-10-CM | POA: Insufficient documentation

## 2022-04-27 DIAGNOSIS — H903 Sensorineural hearing loss, bilateral: Secondary | ICD-10-CM | POA: Diagnosis not present

## 2022-05-10 DIAGNOSIS — E119 Type 2 diabetes mellitus without complications: Secondary | ICD-10-CM | POA: Diagnosis not present

## 2022-05-10 DIAGNOSIS — H40003 Preglaucoma, unspecified, bilateral: Secondary | ICD-10-CM | POA: Diagnosis not present

## 2022-05-10 DIAGNOSIS — Z961 Presence of intraocular lens: Secondary | ICD-10-CM | POA: Diagnosis not present

## 2022-05-10 LAB — HM DIABETES EYE EXAM

## 2022-05-11 DIAGNOSIS — D485 Neoplasm of uncertain behavior of skin: Secondary | ICD-10-CM | POA: Diagnosis not present

## 2022-05-11 DIAGNOSIS — Z85828 Personal history of other malignant neoplasm of skin: Secondary | ICD-10-CM | POA: Diagnosis not present

## 2022-05-11 DIAGNOSIS — L821 Other seborrheic keratosis: Secondary | ICD-10-CM | POA: Diagnosis not present

## 2022-05-11 DIAGNOSIS — L814 Other melanin hyperpigmentation: Secondary | ICD-10-CM | POA: Diagnosis not present

## 2022-05-11 DIAGNOSIS — L570 Actinic keratosis: Secondary | ICD-10-CM | POA: Diagnosis not present

## 2022-05-11 DIAGNOSIS — Z08 Encounter for follow-up examination after completed treatment for malignant neoplasm: Secondary | ICD-10-CM | POA: Diagnosis not present

## 2022-05-12 ENCOUNTER — Other Ambulatory Visit: Payer: Self-pay | Admitting: Nurse Practitioner

## 2022-05-17 DIAGNOSIS — R42 Dizziness and giddiness: Secondary | ICD-10-CM | POA: Diagnosis not present

## 2022-05-17 DIAGNOSIS — R29898 Other symptoms and signs involving the musculoskeletal system: Secondary | ICD-10-CM | POA: Diagnosis not present

## 2022-05-18 DIAGNOSIS — L570 Actinic keratosis: Secondary | ICD-10-CM | POA: Diagnosis not present

## 2022-05-18 DIAGNOSIS — D0439 Carcinoma in situ of skin of other parts of face: Secondary | ICD-10-CM | POA: Diagnosis not present

## 2022-05-18 DIAGNOSIS — L578 Other skin changes due to chronic exposure to nonionizing radiation: Secondary | ICD-10-CM | POA: Diagnosis not present

## 2022-05-30 DIAGNOSIS — R29898 Other symptoms and signs involving the musculoskeletal system: Secondary | ICD-10-CM | POA: Diagnosis not present

## 2022-05-30 DIAGNOSIS — R42 Dizziness and giddiness: Secondary | ICD-10-CM | POA: Diagnosis not present

## 2022-06-01 DIAGNOSIS — R29898 Other symptoms and signs involving the musculoskeletal system: Secondary | ICD-10-CM | POA: Diagnosis not present

## 2022-06-01 DIAGNOSIS — R42 Dizziness and giddiness: Secondary | ICD-10-CM | POA: Diagnosis not present

## 2022-06-06 DIAGNOSIS — R42 Dizziness and giddiness: Secondary | ICD-10-CM | POA: Diagnosis not present

## 2022-06-06 DIAGNOSIS — R29898 Other symptoms and signs involving the musculoskeletal system: Secondary | ICD-10-CM | POA: Diagnosis not present

## 2022-06-08 DIAGNOSIS — R29898 Other symptoms and signs involving the musculoskeletal system: Secondary | ICD-10-CM | POA: Diagnosis not present

## 2022-06-08 DIAGNOSIS — R42 Dizziness and giddiness: Secondary | ICD-10-CM | POA: Diagnosis not present

## 2022-06-13 DIAGNOSIS — R29898 Other symptoms and signs involving the musculoskeletal system: Secondary | ICD-10-CM | POA: Diagnosis not present

## 2022-06-13 DIAGNOSIS — R42 Dizziness and giddiness: Secondary | ICD-10-CM | POA: Diagnosis not present

## 2022-06-15 DIAGNOSIS — R29898 Other symptoms and signs involving the musculoskeletal system: Secondary | ICD-10-CM | POA: Diagnosis not present

## 2022-06-15 DIAGNOSIS — R42 Dizziness and giddiness: Secondary | ICD-10-CM | POA: Diagnosis not present

## 2022-06-20 DIAGNOSIS — R42 Dizziness and giddiness: Secondary | ICD-10-CM | POA: Diagnosis not present

## 2022-06-20 DIAGNOSIS — R29898 Other symptoms and signs involving the musculoskeletal system: Secondary | ICD-10-CM | POA: Diagnosis not present

## 2022-06-22 DIAGNOSIS — R29898 Other symptoms and signs involving the musculoskeletal system: Secondary | ICD-10-CM | POA: Diagnosis not present

## 2022-06-22 DIAGNOSIS — R42 Dizziness and giddiness: Secondary | ICD-10-CM | POA: Diagnosis not present

## 2022-06-30 NOTE — Progress Notes (Signed)
FOLLOW UP  Assessment and Plan:   Essential hypertension Well controlled; continue medication Monitor blood pressure at home; call if consistently over 130/80 Continue DASH diet.   Reminder to go to the ER if any CP, SOB, nausea, dizziness, severe HA, changes vision/speech, left arm numbness and tingling and jaw pain. - CBC  Coronary artery disease involving coronary bypass graft of native heart, Other angina (HCC) very infrequent CCS class II exertional angina pectoris; continue BB; followed by cardiology (Dr. Sallyanne Kuster) Unremarkable CTA in 02/2019 Continue ASA, close management of cholesterol   Type 2 diabetes mellitus with stage 2 chronic kidney disease, without long-term current use of insulin (HCC) Continue metformin Continue diet and exercise.  Perform daily foot/skin check, notify office of any concerning changes.  -     Hemoglobin A1c - CMP  Hypothyroidism, unspecified type Continue synthroid reminded to take on an empty stomach 30-38mns before food.  -     TSH  Mixed hyperlipidemia associated with Type 2 DM(HCC) Tolerating low dose rosuvastatin; LDL goal <70 Continue low cholesterol diet and exercise.  -     Lipid panel  Vitamin D deficiency Continue supplementation At goal at last visit; defer checking level  Macrocytic anemia/B12 deficiency Currently taking 1000 mcg of Vit B12   Medication Management Continued  Dizziness Continue exercises Follow with ENT  Continue diet and meds as discussed. Further disposition pending results of labs. Discussed med's effects and SE's.   Over 30 minutes of exam, counseling, chart review, and critical decision making was performed.   Future Appointments  Date Time Provider [place] 07/13/2022  8:00 AM Croitoru, MDani Gobble MD [facility] None  09/30/2022 10:30 AM MUnk Pinto MD [facility] None  12/06/2022 10:00 AM WAlycia Rossetti NP [facility] None  03/30/2023 11:00 AM MUnk Pinto MD [facility] None     ----------------------------------------------------------------------------------------------------------------------  HPI 76y.o. male  presents for 3 month follow up on hypertension, cholesterol, diabetes, weight and vitamin D deficiency.   He continues to have dizziness and has been present for 50-60 years. Has used medication in the past and did not relieve the dizziness and has also been to ENT and cold not find any abnormalities. He went through multiple treatments for Dizziness and no improvement. He has a follow up appointment with ENT. He feels like his right ear is constantly clogged.   BMI is Body mass index is 24.71 kg/m., he has been working on diet and reports he is active throughout the day, doesn't sit much, but not intentionally exercising.  Wt Readings from Last 3 Encounters:  07/01/22 172 lb 3.2 oz (78.1 kg)  03/25/22 167 lb 9.6 oz (76 kg)  03/09/22 165 lb (74.8 kg)   Patient has ASCAD with stenting in 2001, CABG 2008 with very infrequent CCS class II exertional angina pectoris and followed by Dr. CSallyanne KusterAortic atherosclerosis per CXR in 2017. ECHO 02/2019 showed LVEF 60-65%.   His blood pressure has been controlled at home (115-130/60-70 at home), today their BP is BP: 136/68. He does notice some fatigue with use of Verapamil - but is controlling his migraines.  BP Readings from Last 3 Encounters:  07/01/22 136/68  04/19/22 (!) 95/37  03/25/22 118/70     He does not workout though very active around his property. He denies chest pain, shortness of breath, dizziness.    He is on cholesterol medication (rosuvastatin 20 mg twice weekly, hx of myalgias with daily dosing) and Zetia daily and denies myalgias. His cholesterol is at  goal. The cholesterol last visit was:   Lab Results  Component Value Date   CHOL 111 03/25/2022   HDL 60 03/25/2022   LDLCALC 36 03/25/2022   TRIG 71 03/25/2022   CHOLHDL 1.9 03/25/2022    He has been working on diet for T2 diabetes  on meformin (2000 mg daily), and denies foot ulcerations, increased appetite, nausea, paresthesia of the feet, polydipsia, polyuria, visual disturbances, vomiting and weight loss. He does check fasting sugars, recently consistently running in 110-115. Last A1C in the office was:  Lab Results  Component Value Date   HGBA1C 5.9 (H) 03/25/2022   CKD II associated with T2DM on losartan Lab Results  Component Value Date   EGFR 88 03/25/2022    He is on thyroid medication. His medication was not changed last visit.   Lab Results  Component Value Date   TSH 1.20 03/25/2022   Patient is on Vitamin D supplement.   Lab Results  Component Value Date   VD25OH 67 03/25/2022     He has persistent anemia worse at last check; macrocytic/hyperchromic, had normal iron, folate on 03/12/2019, B12 was low and recommended sublingual B12 last year but admits forgot and never started Lab Results  Component Value Date   WBC 4.6 03/25/2022   HGB 13.2 03/25/2022   HCT 39.6 03/25/2022   MCV 102.1 (H) 03/25/2022   PLT 176 03/25/2022   Lab Results  Component Value Date   VITAMINB12 1,913 (H) 03/25/2022    Current Medications:  Current Outpatient Medications on File Prior to Visit  Medication Sig   acyclovir (ZOVIRAX) 800 MG tablet Take  1 tablet  Daily  as needed for Fever Blister / Mouth Ulcers   aspirin EC 81 MG tablet Take 1 tablet (81 mg total) by mouth daily.   Blood Glucose Monitoring Suppl DEVI Use kit to check blood sugar once a day   Cholecalciferol 5000 UNITS TABS Take 1 tablet by mouth daily.   Cyanocobalamin (B-12) 1000 MCG SUBL Place 1 tablet under the tongue daily. 2500 mcg  every other day   ezetimibe (ZETIA) 10 MG tablet Take 1 tablet (10 mg total) by mouth daily.   famotidine (PEPCID) 20 MG tablet TAKE 1 TABLET BY MOUTH TWICE A DAY   glucose blood (CONTOUR NEXT TEST) test strip USE TO TEST BLOOD SUGAR ONCE DAILY   Lancets MISC Use to test blood sugar once daily   levothyroxine  (SYNTHROID) 50 MCG tablet TAKE 1 TABLET BY MOUTH DAILY ON AN EMPTY STOMACH WITH ONLY WATER FOR 30 MINUTES & NO ANTACID MEDS, CALCIUM OR MAGNESIUM FOR 4 HOURS & AVOID BIOTIN   metFORMIN (GLUCOPHAGE-XR) 500 MG 24 hr tablet TAKE 2 TABLETS 2 TIMES PER DAY WITH MEALS FOR DIABETES   Multiple Vitamin (MULTIVITAMIN) tablet Take 1 tablet by mouth at bedtime.    Omega-3 Fatty Acids (FISH OIL) 1000 MG CAPS Take 1,000 mg by mouth at bedtime.    rosuvastatin (CRESTOR) 10 MG tablet TAKE 1 TABLET 2 TIMES PER WEEK FOR CHOLESTEROL   triamcinolone (NASACORT) 55 MCG/ACT AERO nasal inhaler Place 1 spray into the nose daily as needed.   verapamil (CALAN-SR) 240 MG CR tablet Take 1 tablet every Morning with Food  for BP & Migraine Prevention   No current facility-administered medications on file prior to visit.     Allergies:  Allergies  Allergen Reactions   Gemfibrozil Other (See Comments)    Muscle aches   Ace Inhibitors Cough   Lipitor [Atorvastatin]  Other (See Comments)    Joint pain.     Medical History:  Past Medical History:  Diagnosis Date   Anemia    low iron   Arthritis    Cancer ([place])    skin cancer on face   Cataracts, bilateral    Coronary artery disease    CABG - 2008   Diabetes mellitus without complication ([place])    type II   GERD (gastroesophageal reflux disease)    Hepatitis    Hyperlipidemia    Hypertension    pt states he does not have HTN, takes Atenolol for migraines   Hypothyroidism    Migraine headache with aura    takes Atenolol   Sleep apnea    does not use cpap   Family history- Reviewed and unchanged Social history- Reviewed and unchanged   Review of Systems:  Review of Systems  Constitutional:  Negative for malaise/fatigue and weight loss.  HENT:  Negative for hearing loss and tinnitus.   Eyes:  Negative for blurred vision and double vision.  Respiratory:  Negative for cough, shortness of breath and wheezing.   Cardiovascular:  Negative for chest pain,  palpitations, orthopnea, claudication and leg swelling.  Gastrointestinal:  Negative for abdominal pain, blood in stool, constipation, diarrhea, heartburn, melena, nausea and vomiting.  Genitourinary: Negative.   Musculoskeletal:  Negative for joint pain and myalgias.  Skin:  Negative for rash.  Neurological:  Positive for dizziness. Negative for tingling, sensory change, weakness and headaches.  Endo/Heme/Allergies:  Negative for polydipsia.  Psychiatric/Behavioral: Negative.    All other systems reviewed and are negative.     Physical Exam: BP 136/68   Pulse 82   Temp (!) 97.2 F (36.2 C)   Ht _0  (1.778 m)   Wt 172 lb 3.2 oz (78.1 kg)   SpO2 95%   BMI 24.71 kg/m  Wt Readings from Last 3 Encounters:  07/01/22 172 lb 3.2 oz (78.1 kg)  03/25/22 167 lb 9.6 oz (76 kg)  03/09/22 165 lb (74.8 kg)   General Appearance: Well nourished, in no apparent distress. Eyes: PERRLA, EOMs, conjunctiva no swelling or erythema Sinuses: No Frontal/maxillary tenderness ENT/Mouth: Ext aud canals clear, TMs without erythema, bulging. No erythema, swelling, or exudate on post pharynx.  Tonsils not swollen or erythematous. Hearing normal.  Neck: Supple, thyroid normal.  Respiratory: Respiratory effort normal, BS equal bilaterally without rales, rhonchi, wheezing or stridor.  Cardio: RRR with no MRGs. Brisk peripheral pulses without edema.  Abdomen: Soft, + BS.  Non tender, no guarding, rebound, hernias, masses. Lymphatics: Non tender without lymphadenopathy.  Musculoskeletal: Full ROM, 5/5 strength, Normal gait Skin: Warm, dry without rashes,  ecchymosis.  Neuro: Cranial nerves intact. No cerebellar symptoms.  Psych: Awake and oriented X 3, normal affect, Insight and Judgment appropriate.    Alycia Rossetti, NP 10:44 AM [facility]

## 2022-07-01 ENCOUNTER — Encounter: Payer: Self-pay | Admitting: Nurse Practitioner

## 2022-07-01 ENCOUNTER — Ambulatory Visit (INDEPENDENT_AMBULATORY_CARE_PROVIDER_SITE_OTHER): Payer: Medicare Other | Admitting: Nurse Practitioner

## 2022-07-01 VITALS — BP 136/68 | HR 82 | Temp 97.2°F | Ht 70.0 in | Wt 172.2 lb

## 2022-07-01 DIAGNOSIS — E538 Deficiency of other specified B group vitamins: Secondary | ICD-10-CM

## 2022-07-01 DIAGNOSIS — E559 Vitamin D deficiency, unspecified: Secondary | ICD-10-CM

## 2022-07-01 DIAGNOSIS — R42 Dizziness and giddiness: Secondary | ICD-10-CM | POA: Diagnosis not present

## 2022-07-01 DIAGNOSIS — E1169 Type 2 diabetes mellitus with other specified complication: Secondary | ICD-10-CM

## 2022-07-01 DIAGNOSIS — I10 Essential (primary) hypertension: Secondary | ICD-10-CM

## 2022-07-01 DIAGNOSIS — I700 Atherosclerosis of aorta: Secondary | ICD-10-CM

## 2022-07-01 DIAGNOSIS — Z79899 Other long term (current) drug therapy: Secondary | ICD-10-CM | POA: Diagnosis not present

## 2022-07-01 DIAGNOSIS — E785 Hyperlipidemia, unspecified: Secondary | ICD-10-CM

## 2022-07-01 DIAGNOSIS — E1122 Type 2 diabetes mellitus with diabetic chronic kidney disease: Secondary | ICD-10-CM | POA: Diagnosis not present

## 2022-07-01 DIAGNOSIS — N182 Chronic kidney disease, stage 2 (mild): Secondary | ICD-10-CM

## 2022-07-01 DIAGNOSIS — D539 Nutritional anemia, unspecified: Secondary | ICD-10-CM | POA: Diagnosis not present

## 2022-07-01 DIAGNOSIS — I25708 Atherosclerosis of coronary artery bypass graft(s), unspecified, with other forms of angina pectoris: Secondary | ICD-10-CM

## 2022-07-01 DIAGNOSIS — E039 Hypothyroidism, unspecified: Secondary | ICD-10-CM | POA: Diagnosis not present

## 2022-07-01 DIAGNOSIS — R413 Other amnesia: Secondary | ICD-10-CM

## 2022-07-01 NOTE — Patient Instructions (Signed)

## 2022-07-04 DIAGNOSIS — G459 Transient cerebral ischemic attack, unspecified: Secondary | ICD-10-CM

## 2022-07-04 HISTORY — DX: Transient cerebral ischemic attack, unspecified: G45.9

## 2022-07-12 ENCOUNTER — Other Ambulatory Visit: Payer: Medicare Other

## 2022-07-12 DIAGNOSIS — E039 Hypothyroidism, unspecified: Secondary | ICD-10-CM | POA: Diagnosis not present

## 2022-07-12 DIAGNOSIS — E1169 Type 2 diabetes mellitus with other specified complication: Secondary | ICD-10-CM | POA: Diagnosis not present

## 2022-07-12 DIAGNOSIS — E785 Hyperlipidemia, unspecified: Secondary | ICD-10-CM | POA: Diagnosis not present

## 2022-07-12 DIAGNOSIS — N182 Chronic kidney disease, stage 2 (mild): Secondary | ICD-10-CM | POA: Diagnosis not present

## 2022-07-12 DIAGNOSIS — Z79899 Other long term (current) drug therapy: Secondary | ICD-10-CM | POA: Diagnosis not present

## 2022-07-12 DIAGNOSIS — E1122 Type 2 diabetes mellitus with diabetic chronic kidney disease: Secondary | ICD-10-CM | POA: Diagnosis not present

## 2022-07-13 ENCOUNTER — Encounter: Payer: Self-pay | Admitting: Cardiovascular Disease

## 2022-07-13 ENCOUNTER — Ambulatory Visit: Payer: Medicare Other | Attending: Cardiovascular Disease | Admitting: Cardiovascular Disease

## 2022-07-13 VITALS — BP 124/62 | HR 73 | Ht 70.0 in | Wt 174.6 lb

## 2022-07-13 DIAGNOSIS — I25810 Atherosclerosis of coronary artery bypass graft(s) without angina pectoris: Secondary | ICD-10-CM | POA: Diagnosis not present

## 2022-07-13 DIAGNOSIS — I10 Essential (primary) hypertension: Secondary | ICD-10-CM | POA: Diagnosis not present

## 2022-07-13 DIAGNOSIS — G43109 Migraine with aura, not intractable, without status migrainosus: Secondary | ICD-10-CM | POA: Diagnosis not present

## 2022-07-13 DIAGNOSIS — E7800 Pure hypercholesterolemia, unspecified: Secondary | ICD-10-CM

## 2022-07-13 DIAGNOSIS — R7303 Prediabetes: Secondary | ICD-10-CM

## 2022-07-13 DIAGNOSIS — I700 Atherosclerosis of aorta: Secondary | ICD-10-CM

## 2022-07-13 LAB — CBC WITH DIFFERENTIAL/PLATELET
Absolute Monocytes: 545 cells/uL (ref 200–950)
Basophils Absolute: 28 cells/uL (ref 0–200)
Basophils Relative: 0.5 %
Eosinophils Absolute: 160 cells/uL (ref 15–500)
Eosinophils Relative: 2.9 %
HCT: 37.7 % — ABNORMAL LOW (ref 38.5–50.0)
Hemoglobin: 12.6 g/dL — ABNORMAL LOW (ref 13.2–17.1)
Lymphs Abs: 2618 cells/uL (ref 850–3900)
MCH: 34.1 pg — ABNORMAL HIGH (ref 27.0–33.0)
MCHC: 33.4 g/dL (ref 32.0–36.0)
MCV: 102.2 fL — ABNORMAL HIGH (ref 80.0–100.0)
MPV: 9.9 fL (ref 7.5–12.5)
Monocytes Relative: 9.9 %
Neutro Abs: 2151 cells/uL (ref 1500–7800)
Neutrophils Relative %: 39.1 %
Platelets: 179 10*3/uL (ref 140–400)
RBC: 3.69 10*6/uL — ABNORMAL LOW (ref 4.20–5.80)
RDW: 11.9 % (ref 11.0–15.0)
Total Lymphocyte: 47.6 %
WBC: 5.5 10*3/uL (ref 3.8–10.8)

## 2022-07-13 LAB — COMPLETE METABOLIC PANEL WITH GFR
AG Ratio: 1.8 (calc) (ref 1.0–2.5)
ALT: 16 U/L (ref 9–46)
AST: 16 U/L (ref 10–35)
Albumin: 4.8 g/dL (ref 3.6–5.1)
Alkaline phosphatase (APISO): 53 U/L (ref 35–144)
BUN: 18 mg/dL (ref 7–25)
CO2: 29 mmol/L (ref 20–32)
Calcium: 9.8 mg/dL (ref 8.6–10.3)
Chloride: 104 mmol/L (ref 98–110)
Creat: 0.93 mg/dL (ref 0.70–1.28)
Globulin: 2.6 g/dL (calc) (ref 1.9–3.7)
Glucose, Bld: 118 mg/dL — ABNORMAL HIGH (ref 65–99)
Potassium: 4.7 mmol/L (ref 3.5–5.3)
Sodium: 142 mmol/L (ref 135–146)
Total Bilirubin: 0.6 mg/dL (ref 0.2–1.2)
Total Protein: 7.4 g/dL (ref 6.1–8.1)
eGFR: 84 mL/min/{1.73_m2} (ref >=60)

## 2022-07-13 LAB — HEMOGLOBIN A1C
Hgb A1c MFr Bld: 6.1 % of total Hgb — ABNORMAL HIGH (ref <5.7)
Mean Plasma Glucose: 128 mg/dL
eAG (mmol/L): 7.1 mmol/L

## 2022-07-13 LAB — LIPID PANEL
Cholesterol: 138 mg/dL (ref <200)
HDL: 69 mg/dL (ref >=40)
LDL Cholesterol (Calc): 52 mg/dL (calc)
Non-HDL Cholesterol (Calc): 69 mg/dL (calc) (ref <130)
Total CHOL/HDL Ratio: 2 (calc) (ref <5.0)
Triglycerides: 85 mg/dL (ref <150)

## 2022-07-13 LAB — TSH: TSH: 1.79 mIU/L (ref 0.40–4.50)

## 2022-07-13 MED ORDER — VERAPAMIL HCL ER 180 MG PO TBCR: 180.0000 mg | EXTENDED_RELEASE_TABLET | Freq: Every day | ORAL | 3 refills | Status: DC

## 2022-07-13 NOTE — Progress Notes (Signed)
Cardiology office note    Date:  07/13/2022   ID:  Skiler, Olden 1945-10-18, MRN [id number]  PCP:  Unk Pinto, MD  Cardiologist:  Kanon Novosel Electrophysiologist:  None   Evaluation Performed:  Follow-Up Visit  Chief Complaint:  CAD f/u  History of Present Illness:    Joshua Higgins is a 77 y.o. male who is now roughly 13 years after coronary bypass surgery (LIMA to LAD, SVG to ramus intermedius and OM, SVG to acute marginal branch of RCA), and also has a history of hyperlipidemia, hypertension, type 2 diabetes mellitus controlled with oral antidiabetics, erectile dysfunction, migraines.  He has developed statin myopathy repeatedly with daily doses of statins, but does tolerate rosuvastatin taken twice weekly.  Continues to feel great and is very physically active.  He chops his own wood and carries heavy white oak logs into the house for his fireplace.  He denies any angina pectoris.  He does note some fatigue when he has to climb up hills, no problems while walking on level ground.  This is not dyspnea, just gets tired easily.  He noticed a change when he was switched from atenolol to verapamil (both medications were being used for migraine prevention and he was having episodes of atypical migraine with visual field cuts).  The verapamil has helped the migraine issues.  He has not had other focal neurological complaints and denies edema, intermittent claudication, orthopnea, PND, palpitations or syncope.  He does have intermittent problems with back pain manifesting as left-sided sciatica he has had 2 previous back surgeries (Dr. Ellene Route).  Of note, he never had angina pectoris and his CAD was detected with a stress test.  He had a near normal PET scan in October 2023 (moderate ischemia and a small portion of the basal inferior wall; EF 55% at rest, 61% stress).  Incidental findings of a patulous esophagus and aortic atherosclerosis on the CT report and as well as multilevel  degenerative changes in the spine.  Metabolic profile is very good.  He is on daily ezetimibe 10 mg and twice weekly rosuvastatin 10 mg and his LDL was 36, HDL 60, normal triglycerides.  Does have prediabetes with a hemoglobin A1c of 5.9% from metformin.  Renal function is normal.  He does not smoke.  Past Medical History:  Diagnosis Date   Anemia    low iron   Arthritis    Cancer ([place])    skin cancer on face   Cataracts, bilateral    Coronary artery disease    CABG - 2008   Diabetes mellitus without complication ([place])    type II   GERD (gastroesophageal reflux disease)    Hepatitis    Hyperlipidemia    Hypertension    pt states he does not have HTN, takes Atenolol for migraines   Hypothyroidism    Migraine headache with aura    takes Atenolol   Sleep apnea    does not use cpap   Past Surgical History:  Procedure Laterality Date   BACK SURGERY     CARDIAC SURGERY     Bypass   CATARACT EXTRACTION, BILATERAL Bilateral 2018   Dr. Gershon Crane   COLONOSCOPY     CORONARY ANGIOPLASTY  2001   CORONARY ARTERY BYPASS GRAFT     ELBOW SURGERY Left    due to damaged nerve   KNEE SURGERY Right    arthroscopic   LUMBAR LAMINECTOMY/DECOMPRESSION MICRODISCECTOMY Right 08/04/2014   Procedure: Right Lumbar four-five Microdiskectomy;  Surgeon:  Kristeen Miss, MD;  Location: [facility];  Service: Neurosurgery;  Laterality: Right;  Right L4-5 Microdiskectomy   SURGERY SCROTAL / TESTICULAR     as a child/teenager     Current Meds  Medication Sig   aspirin EC 81 MG tablet Take 1 tablet (81 mg total) by mouth daily.   Blood Glucose Monitoring Suppl DEVI Use kit to check blood sugar once a day   Cholecalciferol 5000 UNITS TABS Take 1 tablet by mouth daily.   Cyanocobalamin (B-12) 1000 MCG SUBL Place 1 tablet under the tongue daily. 2500 mcg  every other day   famotidine (PEPCID) 20 MG tablet TAKE 1 TABLET BY MOUTH TWICE A DAY   glucose blood (CONTOUR NEXT TEST) test strip USE TO TEST BLOOD  SUGAR ONCE DAILY   Lancets MISC Use to test blood sugar once daily   levothyroxine (SYNTHROID) 50 MCG tablet TAKE 1 TABLET BY MOUTH DAILY ON AN EMPTY STOMACH WITH ONLY WATER FOR 30 MINUTES & NO ANTACID MEDS, CALCIUM OR MAGNESIUM FOR 4 HOURS & AVOID BIOTIN   metFORMIN (GLUCOPHAGE-XR) 500 MG 24 hr tablet TAKE 2 TABLETS 2 TIMES PER DAY WITH MEALS FOR DIABETES   Multiple Vitamin (MULTIVITAMIN) tablet Take 1 tablet by mouth at bedtime.    Omega-3 Fatty Acids (FISH OIL) 1000 MG CAPS Take 1,000 mg by mouth at bedtime.    rosuvastatin (CRESTOR) 10 MG tablet TAKE 1 TABLET 2 TIMES PER WEEK FOR CHOLESTEROL   verapamil (CALAN-SR) 180 MG CR tablet Take 1 tablet (180 mg total) by mouth daily.   [DISCONTINUED] verapamil (CALAN-SR) 240 MG CR tablet Take 1 tablet every Morning with Food  for BP & Migraine Prevention     Allergies:   Gemfibrozil, Ace inhibitors, and Lipitor [atorvastatin]   Social History   Tobacco Use   Smoking status: Former    Types: Cigarettes    Quit date: 10/01/1973    Years since quitting: 48.8   Smokeless tobacco: Never  Substance Use Topics   Alcohol use: No   Drug use: No     Family Hx: The patient's family history includes Arthritis in his mother; CAD in his brother and sister; Diabetes in his maternal grandmother; Heart attack in his brother and father; Hypertension in his brother; Ulcers in his mother.  ROS:   Please see the history of present illness.   All other systems are reviewed and are negative.   Prior CV studies:   The following studies were reviewed today:  Notes and labs from Dr. Melford Aase  Labs/Other Tests and Data Reviewed:    EKG: Not ordered today.  EKG from 12/24/2019 shows sinus rhythm with left axis deviation and no evidence of any repolarization abnormalities.  Recent Labs: 03/25/2022: Magnesium 1.8 07/12/2022: ALT 16; BUN 18; Creat 0.93; Hemoglobin 12.6; Platelets 179; Potassium 4.7; Sodium 142; TSH 1.79   Lipid Panel     Component Value  Date/Time   CHOL 138 07/12/2022 0000   TRIG 85 07/12/2022 0000   HDL 69 07/12/2022 0000   CHOLHDL 2.0 07/12/2022 0000   VLDL 25 02/14/2017 1214   LDLCALC 52 07/12/2022 0000     Recent Lipid Panel Lab Results  Component Value Date/Time   CHOL 138 07/12/2022 12:00 AM   TRIG 85 07/12/2022 12:00 AM   HDL 69 07/12/2022 12:00 AM   CHOLHDL 2.0 07/12/2022 12:00 AM   LDLCALC 52 07/12/2022 12:00 AM    Wt Readings from Last 3 Encounters:  07/13/22 174 lb 9.6 oz (  79.2 kg)  07/01/22 172 lb 3.2 oz (78.1 kg)  03/25/22 167 lb 9.6 oz (76 kg)     Objective:    Vital Signs:  BP 124/62 (BP Location: Left Arm, Patient Position: Sitting, Cuff Size: Normal)   Pulse 73   Ht '5\' 10"'$  (1.778 m)   Wt 174 lb 9.6 oz (79.2 kg)   SpO2 98%   BMI 25.05 kg/m      General: Alert, oriented x3, no distress, appears very lean and fit Head: no evidence of trauma, PERRL, EOMI, no exophtalmos or lid lag, no myxedema, no xanthelasma; normal ears, nose and oropharynx Neck: normal jugular venous pulsations and no hepatojugular reflux; brisk carotid pulses without delay and no carotid bruits Chest: clear to auscultation, no signs of consolidation by percussion or palpation, normal fremitus, symmetrical and full respiratory excursions Cardiovascular: normal position and quality of the apical impulse, regular rhythm, normal first and second heart sounds, no murmurs, rubs or gallops Abdomen: no tenderness or distention, no masses by palpation, no abnormal pulsatility or arterial bruits, normal bowel sounds, no hepatosplenomegaly Extremities: no clubbing, cyanosis or edema; 2+ radial, ulnar and brachial pulses bilaterally; 2+ right femoral, posterior tibial and dorsalis pedis pulses; 2+ left femoral, posterior tibial and dorsalis pedis pulses; no subclavian or femoral bruits Neurological: grossly nonfocal Psych: Normal mood and affect      ASSESSMENT & PLAN:    1. Coronary artery disease involving coronary bypass  graft of native heart without angina pectoris   2. Migraine with aura and without status migrainosus, not intractable   3. Hypercholesterolemia   4. Prediabetes   5. Essential hypertension   6. Atherosclerosis of abdominal aorta (HCC)      CAD s/p CABG: Asymptomatic (he has never had angina coronary sufficiency was detected with a stress test).  Very recent low risk PET scan with a small area of basal inferior ischemia, otherwise normal.  Preserved left ventricular systolic function.  I think we will go back to a plan monitoring schedule with treadmill ECG stress test every other year. Migraines: Took atenolol for years with good relief but recently was having breakthrough migraines with visual field cuts.  Now on verapamil with successful near abolition of the migraine-like symptoms, but with some degree of fatigue.  Reduce the verapamil to 180 mg daily. HLP: Excellent lipid profile even though he only takes rosuvastatin twice weekly (he had myopathy with higher doses).  Continue ezetimibe and current rosuvastatin dosing. PreDM: Adequate glycemic control.  He is very physically active and very lean. HTN: Well-controlled and I suspect he will remain well-controlled even when he cuts back on the dose of verapamil. Atherosclerosis of the aorta: Incidentally noted in both the thoracic and abdominal aorta.  Normal caliber without aneurysm.   Patient Instructions  Medication Instructions:  DECREASE Verapamil to 180 mg daily   *If you need a refill on your cardiac medications before your next appointment, please call your pharmacy*  Lab Work: NONE ordered at this time of appointment   If you have labs (blood work) drawn today and your tests are completely normal, you will receive your results only by: [place] (if you have MyChart) OR A paper copy in the mail If you have any lab test that is abnormal or we need to change your treatment, we will call you to review the  results.  Testing/Procedures: NONE ordered at this time of appointment   Follow-Up: At [facility], you and your health needs are our  priority.  As part of our continuing mission to provide you with exceptional heart care, we have created designated Provider Care Teams.  These Care Teams include your primary Cardiologist (physician) and Advanced Practice Providers (APPs -  Physician Assistants and Nurse Practitioners) who all work together to provide you with the care you need, when you need it.  Your next appointment:   1 year(s)  The format for your next appointment:   In Person  Provider:   Sanda Klein, MD     Other Instructions  Important Information About Sugar         Signed, Sanda Klein, MD  07/13/2022 8:26 AM    [place]

## 2022-07-13 NOTE — Patient Instructions (Signed)
Medication Instructions:  DECREASE Verapamil to 180 mg daily   *If you need a refill on your cardiac medications before your next appointment, please call your pharmacy*  Lab Work: NONE ordered at this time of appointment   If you have labs (blood work) drawn today and your tests are completely normal, you will receive your results only by: [place] (if you have MyChart) OR A paper copy in the mail If you have any lab test that is abnormal or we need to change your treatment, we will call you to review the results.  Testing/Procedures: NONE ordered at this time of appointment   Follow-Up: At [facility], you and your health needs are our priority.  As part of our continuing mission to provide you with exceptional heart care, we have created designated Provider Care Teams.  These Care Teams include your primary Cardiologist (physician) and Advanced Practice Providers (APPs -  Physician Assistants and Nurse Practitioners) who all work together to provide you with the care you need, when you need it.  Your next appointment:   1 year(s)  The format for your next appointment:   In Person  Provider:   Sanda Klein, MD     Other Instructions  Important Information About Sugar

## 2022-07-18 ENCOUNTER — Other Ambulatory Visit: Payer: Self-pay

## 2022-07-18 ENCOUNTER — Emergency Department (HOSPITAL_BASED_OUTPATIENT_CLINIC_OR_DEPARTMENT_OTHER): Payer: Medicare Other

## 2022-07-18 ENCOUNTER — Encounter (HOSPITAL_COMMUNITY): Payer: Self-pay

## 2022-07-18 ENCOUNTER — Encounter (HOSPITAL_BASED_OUTPATIENT_CLINIC_OR_DEPARTMENT_OTHER): Payer: Self-pay | Admitting: Emergency Medicine

## 2022-07-18 ENCOUNTER — Observation Stay (HOSPITAL_BASED_OUTPATIENT_CLINIC_OR_DEPARTMENT_OTHER)
Admission: EM | Admit: 2022-07-18 | Discharge: 2022-07-19 | Disposition: A | Discharge status: Home / Self Care | Payer: Medicare Other | Attending: Internal Medicine | Admitting: Internal Medicine

## 2022-07-18 DIAGNOSIS — Z7984 Long term (current) use of oral hypoglycemic drugs: Secondary | ICD-10-CM | POA: Insufficient documentation

## 2022-07-18 DIAGNOSIS — I2510 Atherosclerotic heart disease of native coronary artery without angina pectoris: Secondary | ICD-10-CM | POA: Insufficient documentation

## 2022-07-18 DIAGNOSIS — Z79899 Other long term (current) drug therapy: Secondary | ICD-10-CM | POA: Insufficient documentation

## 2022-07-18 DIAGNOSIS — E039 Hypothyroidism, unspecified: Secondary | ICD-10-CM | POA: Diagnosis present

## 2022-07-18 DIAGNOSIS — E1122 Type 2 diabetes mellitus with diabetic chronic kidney disease: Secondary | ICD-10-CM | POA: Diagnosis present

## 2022-07-18 DIAGNOSIS — Z7982 Long term (current) use of aspirin: Secondary | ICD-10-CM | POA: Diagnosis not present

## 2022-07-18 DIAGNOSIS — N182 Chronic kidney disease, stage 2 (mild): Secondary | ICD-10-CM | POA: Diagnosis not present

## 2022-07-18 DIAGNOSIS — I6523 Occlusion and stenosis of bilateral carotid arteries: Secondary | ICD-10-CM | POA: Diagnosis not present

## 2022-07-18 DIAGNOSIS — I25810 Atherosclerosis of coronary artery bypass graft(s) without angina pectoris: Secondary | ICD-10-CM | POA: Diagnosis present

## 2022-07-18 DIAGNOSIS — E1169 Type 2 diabetes mellitus with other specified complication: Secondary | ICD-10-CM | POA: Diagnosis present

## 2022-07-18 DIAGNOSIS — Z87891 Personal history of nicotine dependence: Secondary | ICD-10-CM | POA: Insufficient documentation

## 2022-07-18 DIAGNOSIS — Z9861 Coronary angioplasty status: Secondary | ICD-10-CM | POA: Diagnosis not present

## 2022-07-18 DIAGNOSIS — Z951 Presence of aortocoronary bypass graft: Secondary | ICD-10-CM | POA: Diagnosis not present

## 2022-07-18 DIAGNOSIS — E785 Hyperlipidemia, unspecified: Secondary | ICD-10-CM

## 2022-07-18 DIAGNOSIS — K219 Gastro-esophageal reflux disease without esophagitis: Secondary | ICD-10-CM | POA: Diagnosis present

## 2022-07-18 DIAGNOSIS — Z872 Personal history of diseases of the skin and subcutaneous tissue: Secondary | ICD-10-CM | POA: Diagnosis not present

## 2022-07-18 DIAGNOSIS — I10 Essential (primary) hypertension: Secondary | ICD-10-CM | POA: Diagnosis present

## 2022-07-18 DIAGNOSIS — R229 Localized swelling, mass and lump, unspecified: Secondary | ICD-10-CM | POA: Diagnosis not present

## 2022-07-18 DIAGNOSIS — Z8673 Personal history of transient ischemic attack (TIA), and cerebral infarction without residual deficits: Secondary | ICD-10-CM | POA: Diagnosis present

## 2022-07-18 DIAGNOSIS — G459 Transient cerebral ischemic attack, unspecified: Principal | ICD-10-CM

## 2022-07-18 DIAGNOSIS — L578 Other skin changes due to chronic exposure to nonionizing radiation: Secondary | ICD-10-CM | POA: Diagnosis not present

## 2022-07-18 DIAGNOSIS — R4789 Other speech disturbances: Secondary | ICD-10-CM | POA: Insufficient documentation

## 2022-07-18 DIAGNOSIS — R519 Headache, unspecified: Secondary | ICD-10-CM | POA: Diagnosis not present

## 2022-07-18 DIAGNOSIS — R4701 Aphasia: Secondary | ICD-10-CM | POA: Diagnosis not present

## 2022-07-18 DIAGNOSIS — Z85828 Personal history of other malignant neoplasm of skin: Secondary | ICD-10-CM | POA: Diagnosis not present

## 2022-07-18 DIAGNOSIS — Z86007 Personal history of in-situ neoplasm of skin: Secondary | ICD-10-CM | POA: Diagnosis not present

## 2022-07-18 DIAGNOSIS — R41841 Cognitive communication deficit: Secondary | ICD-10-CM | POA: Diagnosis not present

## 2022-07-18 DIAGNOSIS — I129 Hypertensive chronic kidney disease with stage 1 through stage 4 chronic kidney disease, or unspecified chronic kidney disease: Secondary | ICD-10-CM | POA: Insufficient documentation

## 2022-07-18 DIAGNOSIS — Z08 Encounter for follow-up examination after completed treatment for malignant neoplasm: Secondary | ICD-10-CM | POA: Diagnosis not present

## 2022-07-18 DIAGNOSIS — D049 Carcinoma in situ of skin, unspecified: Secondary | ICD-10-CM | POA: Diagnosis not present

## 2022-07-18 DIAGNOSIS — Z09 Encounter for follow-up examination after completed treatment for conditions other than malignant neoplasm: Secondary | ICD-10-CM | POA: Diagnosis not present

## 2022-07-18 LAB — CBC
HCT: 39.4 % (ref 39.0–52.0)
HCT: 41.4 % (ref 39.0–52.0)
Hemoglobin: 13.1 g/dL (ref 13.0–17.0)
Hemoglobin: 13.7 g/dL (ref 13.0–17.0)
MCH: 33.9 pg (ref 26.0–34.0)
MCH: 33.7 pg (ref 26.0–34.0)
MCHC: 33.2 g/dL (ref 30.0–36.0)
MCHC: 33.1 g/dL (ref 30.0–36.0)
MCV: 101.8 fL — ABNORMAL HIGH (ref 80.0–100.0)
MCV: 102 fL — ABNORMAL HIGH (ref 80.0–100.0)
Platelets: 171 10*3/uL (ref 150–400)
Platelets: 183 10*3/uL (ref 150–400)
RBC: 3.87 MIL/uL — ABNORMAL LOW (ref 4.22–5.81)
RBC: 4.06 MIL/uL — ABNORMAL LOW (ref 4.22–5.81)
RDW: 12.4 % (ref 11.5–15.5)
RDW: 12.4 % (ref 11.5–15.5)
WBC: 6.8 10*3/uL (ref 4.0–10.5)
WBC: 7 10*3/uL (ref 4.0–10.5)
nRBC: 0 % (ref 0.0–0.2)
nRBC: 0 % (ref 0.0–0.2)

## 2022-07-18 LAB — CBG MONITORING, ED: Glucose-Capillary: 104 mg/dL — ABNORMAL HIGH (ref 70–99)

## 2022-07-18 LAB — COMPREHENSIVE METABOLIC PANEL
ALT: 14 U/L (ref 0–44)
AST: 17 U/L (ref 15–41)
Albumin: 4.7 g/dL (ref 3.5–5.0)
Alkaline Phosphatase: 48 U/L (ref 38–126)
Anion gap: 11 (ref 5–15)
BUN: 16 mg/dL (ref 8–23)
CO2: 25 mmol/L (ref 22–32)
Calcium: 9.7 mg/dL (ref 8.9–10.3)
Chloride: 102 mmol/L (ref 98–111)
Creatinine, Ser: 1.02 mg/dL (ref 0.61–1.24)
GFR, Estimated: >60 mL/min (ref >60)
Glucose, Bld: 106 mg/dL — ABNORMAL HIGH (ref 70–99)
Potassium: 4.2 mmol/L (ref 3.5–5.1)
Sodium: 138 mmol/L (ref 135–145)
Total Bilirubin: 0.8 mg/dL (ref 0.3–1.2)
Total Protein: 7.8 g/dL (ref 6.5–8.1)

## 2022-07-18 LAB — ETHANOL: Alcohol, Ethyl (B): <10 mg/dL (ref <10)

## 2022-07-18 LAB — DIFFERENTIAL
Abs Immature Granulocytes: 0.02 10*3/uL (ref 0.00–0.07)
Basophils Absolute: 0 10*3/uL (ref 0.0–0.1)
Basophils Relative: 0 %
Eosinophils Absolute: 0.1 10*3/uL (ref 0.0–0.5)
Eosinophils Relative: 2 %
Immature Granulocytes: 0 %
Lymphocytes Relative: 39 %
Lymphs Abs: 2.6 10*3/uL (ref 0.7–4.0)
Monocytes Absolute: 0.6 10*3/uL (ref 0.1–1.0)
Monocytes Relative: 9 %
Neutro Abs: 3.4 10*3/uL (ref 1.7–7.7)
Neutrophils Relative %: 50 %

## 2022-07-18 LAB — PROTIME-INR
INR: 1 (ref 0.8–1.2)
Prothrombin Time: 13.4 seconds (ref 11.4–15.2)

## 2022-07-18 LAB — CREATININE, SERUM
Creatinine, Ser: 0.96 mg/dL (ref 0.61–1.24)
GFR, Estimated: >60 mL/min (ref >60)

## 2022-07-18 LAB — APTT: aPTT: 25 seconds (ref 24–36)

## 2022-07-18 MED ORDER — METOCLOPRAMIDE HCL 5 MG/ML IJ SOLN
5.0000 mg | Freq: Once | INTRAMUSCULAR | Status: AC
  Administered 2022-07-18: 5 mg via INTRAVENOUS
  Filled 2022-07-18: qty 2

## 2022-07-18 MED ORDER — CLOPIDOGREL BISULFATE 300 MG PO TABS
300.0000 mg | ORAL_TABLET | Freq: Once | ORAL | Status: AC
  Administered 2022-07-18: 300 mg via ORAL
  Filled 2022-07-18: qty 1

## 2022-07-18 MED ORDER — DIPHENHYDRAMINE HCL 50 MG/ML IJ SOLN
12.5000 mg | Freq: Once | INTRAMUSCULAR | Status: AC
  Administered 2022-07-18: 12.5 mg via INTRAVENOUS
  Filled 2022-07-18: qty 1

## 2022-07-18 MED ORDER — IOHEXOL 350 MG/ML SOLN
100.0000 mL | Freq: Once | INTRAVENOUS | Status: AC | PRN
  Administered 2022-07-18: 75 mL via INTRAVENOUS

## 2022-07-18 MED ORDER — VERAPAMIL HCL ER 180 MG PO TBCR
180.0000 mg | EXTENDED_RELEASE_TABLET | Freq: Every day | ORAL | Status: DC
  Administered 2022-07-19: 180 mg via ORAL
  Filled 2022-07-18 (×2): qty 1

## 2022-07-18 MED ORDER — ONDANSETRON HCL 4 MG/2ML IJ SOLN: 4.0000 mg | Freq: Four times a day (QID) | INTRAMUSCULAR | Status: DC | PRN

## 2022-07-18 MED ORDER — ACETAMINOPHEN 650 MG RE SUPP: 650.0000 mg | Freq: Four times a day (QID) | RECTAL | Status: DC | PRN

## 2022-07-18 MED ORDER — OMEGA-3-ACID ETHYL ESTERS 1 G PO CAPS
1.0000 g | ORAL_CAPSULE | Freq: Every day | ORAL | Status: DC
  Administered 2022-07-18 – 2022-07-19 (×2): 1 g via ORAL
  Filled 2022-07-18 (×2): qty 1

## 2022-07-18 MED ORDER — CLOPIDOGREL BISULFATE 75 MG PO TABS
75.0000 mg | ORAL_TABLET | Freq: Every day | ORAL | Status: DC
  Administered 2022-07-19: 75 mg via ORAL
  Filled 2022-07-18: qty 1

## 2022-07-18 MED ORDER — METFORMIN HCL ER 500 MG PO TB24: 500.0000 mg | ORAL_TABLET | Freq: Two times a day (BID) | ORAL | Status: DC

## 2022-07-18 MED ORDER — ROSUVASTATIN CALCIUM 20 MG PO TABS
20.0000 mg | ORAL_TABLET | ORAL | Status: DC
  Administered 2022-07-19: 20 mg via ORAL
  Filled 2022-07-18: qty 1

## 2022-07-18 MED ORDER — VITAMIN D 25 MCG (1000 UNIT) PO TABS
5000.0000 [IU] | ORAL_TABLET | Freq: Every day | ORAL | Status: DC
  Administered 2022-07-19: 5000 [IU] via ORAL
  Filled 2022-07-18 (×2): qty 5

## 2022-07-18 MED ORDER — ACETAMINOPHEN 325 MG PO TABS: 650.0000 mg | ORAL_TABLET | Freq: Four times a day (QID) | ORAL | Status: DC | PRN

## 2022-07-18 MED ORDER — ONDANSETRON HCL 4 MG PO TABS: 4.0000 mg | ORAL_TABLET | Freq: Four times a day (QID) | ORAL | Status: DC | PRN

## 2022-07-18 MED ORDER — ADULT MULTIVITAMIN W/MINERALS CH
1.0000 | ORAL_TABLET | Freq: Every day | ORAL | Status: DC
  Filled 2022-07-18: qty 1

## 2022-07-18 MED ORDER — ADULT MULTIVITAMIN W/MINERALS CH
1.0000 | ORAL_TABLET | Freq: Every day | ORAL | Status: DC
  Administered 2022-07-19: 1 via ORAL
  Filled 2022-07-18: qty 1

## 2022-07-18 MED ORDER — ASPIRIN 81 MG PO TBEC
81.0000 mg | DELAYED_RELEASE_TABLET | Freq: Every day | ORAL | Status: DC
  Administered 2022-07-18 – 2022-07-19 (×2): 81 mg via ORAL
  Filled 2022-07-18 (×2): qty 1

## 2022-07-18 MED ORDER — ENOXAPARIN SODIUM 40 MG/0.4ML IJ SOSY
40.0000 mg | PREFILLED_SYRINGE | INTRAMUSCULAR | Status: DC
  Administered 2022-07-18: 40 mg via SUBCUTANEOUS
  Filled 2022-07-18: qty 0.4

## 2022-07-18 MED ORDER — STROKE: EARLY STAGES OF RECOVERY BOOK
Freq: Once | Status: AC
  Filled 2022-07-18: qty 1

## 2022-07-18 MED ORDER — LEVOTHYROXINE SODIUM 50 MCG PO TABS
50.0000 ug | ORAL_TABLET | Freq: Every day | ORAL | Status: DC
  Administered 2022-07-19: 50 ug via ORAL
  Filled 2022-07-18: qty 1

## 2022-07-18 MED ORDER — FAMOTIDINE 20 MG PO TABS
20.0000 mg | ORAL_TABLET | Freq: Two times a day (BID) | ORAL | Status: DC
  Administered 2022-07-18 – 2022-07-19 (×2): 20 mg via ORAL
  Filled 2022-07-18 (×2): qty 1

## 2022-07-18 MED ORDER — VITAMIN B-12 1000 MCG PO TABS
2500.0000 ug | ORAL_TABLET | ORAL | Status: DC
  Administered 2022-07-19: 2500 ug via ORAL
  Filled 2022-07-18: qty 3

## 2022-07-18 MED ORDER — SODIUM CHLORIDE 0.9% FLUSH
3.0000 mL | Freq: Once | INTRAVENOUS | Status: AC
  Administered 2022-07-18: 3 mL via INTRAVENOUS

## 2022-07-18 NOTE — H&P (Signed)
History and Physical    Patient: Joshua Higgins ZWC:[id number] DOB: 03-25-1946 DOA: 07/18/2022 DOS: the patient was seen and examined on 07/18/2022 PCP: Unk Pinto, MD  Patient coming from: Home  Chief Complaint:  Chief Complaint  Patient presents with   Code Stroke   HPI: Joshua Higgins is a 77 y.o. male with medical history significant of hypertension, T2DM, and hypothyroidism who presented with difficulty speaking. Patient has been at his usual sate of health until this morning when he noticed difficulty speaking, producing mumbling sounds instead of words. Apparently he woke up with this neurologic deficit.  No other focal symptoms, no headache or dizziness. His wife noticed the changes and call patient's primary care provider. She was advised to call EMS. Patient was aphasic and was transported to the ED.   Patient was evaluated by tele-neurology, recommended to start on antiplatelet therapy and admitted for further workup.  In the ED he had some difficulty word finding but was not focal. At the time of my examination in the hospital his neuro deficit has resolved. When patient had difficulty speaking, he described as severe intensity, "speaking different language", it had no improving or worsening factors, and turn to be transitory in duration.     Review of Systems: As mentioned in the history of present illness. All other systems reviewed and are negative. Past Medical History:  Diagnosis Date   Anemia    low iron   Arthritis    Cancer ([place])    skin cancer on face   Cataracts, bilateral    Coronary artery disease    CABG - 2008   Diabetes mellitus without complication (HCC)    type II   GERD (gastroesophageal reflux disease)    Hepatitis    Hyperlipidemia    Hypertension    pt states he does not have HTN, takes Atenolol for migraines   Hypothyroidism    Migraine headache with aura    takes Atenolol   Sleep apnea    does not use cpap   Past Surgical  History:  Procedure Laterality Date   BACK SURGERY     CARDIAC SURGERY     Bypass   CATARACT EXTRACTION, BILATERAL Bilateral 2018   Dr. Gershon Crane   COLONOSCOPY     CORONARY ANGIOPLASTY  2001   CORONARY ARTERY BYPASS GRAFT     ELBOW SURGERY Left    due to damaged nerve   KNEE SURGERY Right    arthroscopic   LUMBAR LAMINECTOMY/DECOMPRESSION MICRODISCECTOMY Right 08/04/2014   Procedure: Right Lumbar four-five Microdiskectomy;  Surgeon: Kristeen Miss, MD;  Location: [facility];  Service: Neurosurgery;  Laterality: Right;  Right L4-5 Microdiskectomy   SURGERY SCROTAL / TESTICULAR     as a child/teenager   Social History:  reports that he quit smoking about 48 years ago. His smoking use included cigarettes. He has never used smokeless tobacco. He reports that he does not drink alcohol and does not use drugs.  Allergies  Allergen Reactions   Gemfibrozil Other (See Comments)    Muscle aches   Ace Inhibitors Cough   Lipitor [Atorvastatin] Other (See Comments)    Joint pain.    Family History  Problem Relation Age of Onset   Ulcers Mother    Arthritis Mother    Heart attack Father    CAD Sister    Heart attack Brother    CAD Brother    Hypertension Brother    Diabetes Maternal Grandmother     Prior  to Admission medications   Medication Sig Start Date End Date Taking? Authorizing Provider  acyclovir (ZOVIRAX) 800 MG tablet Take  1 tablet  Daily  as needed for Fever Blister / Mouth Ulcers Patient not taking: Reported on 07/13/2022 03/20/22   Unk Pinto, MD  amoxicillin (AMOXIL) 500 MG capsule Take 500 mg by mouth. Take prior to dental procedures Patient not taking: Reported on 07/13/2022 03/18/22   [provider]  aspirin EC 81 MG tablet Take 1 tablet (81 mg total) by mouth daily. 02/13/19   Croitoru, Mihai, MD  Blood Glucose Monitoring Suppl DEVI Use kit to check blood sugar once a day 09/03/18   Liane Comber, NP  Cholecalciferol 5000 UNITS TABS Take 1 tablet by mouth  daily.    [provider]  Cyanocobalamin (B-12) 1000 MCG SUBL Place 1 tablet under the tongue daily. 2500 mcg  every other day    [provider]  ezetimibe (ZETIA) 10 MG tablet Take 1 tablet (10 mg total) by mouth daily. 07/29/21 07/01/22  Croitoru, Mihai, MD  famotidine (PEPCID) 20 MG tablet TAKE 1 TABLET BY MOUTH TWICE A DAY 05/12/22   Alycia Rossetti, NP  glucose blood (CONTOUR NEXT TEST) test strip USE TO TEST BLOOD SUGAR ONCE DAILY 12/23/20   Liane Comber, NP  Lancets MISC Use to test blood sugar once daily 03/10/22   Darrol Jump, NP  levothyroxine (SYNTHROID) 50 MCG tablet TAKE 1 TABLET BY MOUTH DAILY ON AN EMPTY STOMACH WITH ONLY WATER FOR 30 MINUTES & NO ANTACID MEDS, CALCIUM OR MAGNESIUM FOR 4 HOURS & AVOID BIOTIN 07/27/21   Liane Comber, NP  metFORMIN (GLUCOPHAGE-XR) 500 MG 24 hr tablet TAKE 2 TABLETS 2 TIMES PER DAY WITH MEALS FOR DIABETES 04/26/22   Alycia Rossetti, NP  Multiple Vitamin (MULTIVITAMIN) tablet Take 1 tablet by mouth at bedtime.     [provider]  Omega-3 Fatty Acids (FISH OIL) 1000 MG CAPS Take 1,000 mg by mouth at bedtime.     [provider]  rosuvastatin (CRESTOR) 10 MG tablet TAKE 1 TABLET 2 TIMES PER WEEK FOR CHOLESTEROL 02/02/22   Alycia Rossetti, NP  triamcinolone (NASACORT) 55 MCG/ACT AERO nasal inhaler Place 1 spray into the nose daily as needed. Patient not taking: Reported on 07/13/2022 10/22/20 07/01/22  Liane Comber, NP  verapamil (CALAN-SR) 180 MG CR tablet Take 1 tablet (180 mg total) by mouth daily. 07/13/22   Croitoru, Dani Gobble, MD    Physical Exam: Vitals:   07/18/22 1545 07/18/22 1600 07/18/22 1630 07/18/22 1712  BP:  (!) 146/80 (!) 140/71 (!) 154/93  Pulse: 74 70 71 86  Resp: 14 (!) '22 15 16  '$ Temp:    98.7 F (37.1 C)  TempSrc:    Oral  SpO2: 98% 98% 93% 97%   Neurology awake and alert, cranial nerves 2 to 12 intact, strength preserved proximal and distal.  ENT with no pallor Cardiovascular with  S1 and S2 present and rhythmic with no gallops, rubs or murmurs Pulmonary with no rales or wheezing Abdomen with no distention No lower extremity edema.  Data Reviewed:   Na 138, K 4.2 Cl 102 bicarbonate 25 glucose 106 bun 16 cr 1,0 Wbc 6.8 hgb 13.1 plt 171  Head CT with no acute intracranial abnormality.  Extensive chronic small vessel ischemic diease.   Ct angiography head and neck with motion degraded study. Within this limitations is less than 50 % stenosis of the proximal right ICA and moderate stenosis of  the right cavernous and left clinoid segments of the ICA.  Aortic atherosclerosis   EKG 70 bpm, normal axis, normal intervals, sinus rhythm with no significant ST segment or T wave changes.   77 yo male with hypertension, T2DM and dyslipidemia who presented with acute focal neurologic deficit, difficulty speaking, aphasia. On his initial physical examination he is hemodynamically stable and by the time is admitted to the hospital his neurologic deficit has resolved.   Assessment and Plan: * TIA (transient ischemic attack) Focal neurologic deficit has improved. CT head with no acute changes and CT angio of neck and head with no significant vessel stenosis.  Plan for further work up with echocardiogram and brain MRI. Continue telemetry monitoring Blood pressure control with verapamil. Antiplatelet therapy with aspirin and clopidogrel  Statin therapy Follow up with Neurology, PT and OT recommendations Speech and swallow evaluation.   Hypertension Blood pressure controlled, plan to continue with verapamil. Keep systolic blood pressure 992 to 180 in the setting of acute CVA/ TIA  CAD s/p CABG No chest pain, continue with statin and antiplatelet therapy,  CKD stage 2 due to type 2 diabetes mellitus ([place]) Renal function stable and electrolytes within normal limits.   Hyperlipidemia associated with type 2 diabetes mellitus (HCC) Glucose stable, resume metformin Hold on  insulin therapy and check capillary glucose as needed Continue with statin therapy.   GERD (gastroesophageal reflux disease) Continue antiacid therapy.   Hypothyroidism Continue with levothyroxine       Advance Care Planning:   Code Status: Full Code   Consults: full   Family Communication: no family at the bedside   Severity of Illness: The appropriate patient status for this patient is OBSERVATION. Observation status is judged to be reasonable and necessary in order to provide the required intensity of service to ensure the patient's safety. The patient's presenting symptoms, physical exam findings, and initial radiographic and laboratory data in the context of their medical condition is felt to place them at decreased risk for further clinical deterioration. Furthermore, it is anticipated that the patient will be medically stable for discharge from the hospital within 2 midnights of admission.   Author: Tawni Millers, MD 07/18/2022 5:29 PM  For on call review [email or web address].

## 2022-07-18 NOTE — Assessment & Plan Note (Signed)
Focal neurologic deficit has improved. CT head with no acute changes and CT angio of neck and head with no significant vessel stenosis.  Plan for further work up with echocardiogram and brain MRI. Continue telemetry monitoring Blood pressure control with verapamil. Antiplatelet therapy with aspirin and clopidogrel  Statin therapy Follow up with Neurology, PT and OT recommendations Speech and swallow evaluation.

## 2022-07-18 NOTE — ED Notes (Signed)
Patient transported to CT. 

## 2022-07-18 NOTE — Assessment & Plan Note (Signed)
Renal function stable and electrolytes within normal limits.

## 2022-07-18 NOTE — ED Notes (Signed)
Neuro exam with tele neuro in progress. RN/CN at [facility].

## 2022-07-18 NOTE — ED Notes (Signed)
Kim at [facility] called to see what equipment PT has going to [facility]. She will send transport asap.-ABB(NS)

## 2022-07-18 NOTE — Assessment & Plan Note (Signed)
Continue antiacid therapy  ?

## 2022-07-18 NOTE — ED Notes (Signed)
Steady gait with stand by assistance to b/r.

## 2022-07-18 NOTE — ED Notes (Signed)
[facility] here, no changes. Daughter and wife at [facility]

## 2022-07-18 NOTE — ED Triage Notes (Signed)
Pt arrives to ED with c/o aphasia and headache. He notes he developed a headache that started last night. He reports LKW was 1030 yesterday before bedtime. He reports waking up this morning with difficulty speaking. Hx migraine.

## 2022-07-18 NOTE — ED Provider Notes (Signed)
[facility] EMERGENCY DEPT Provider Note   CSN: [id number] Arrival date & time: 07/18/22  1233  An emergency department physician performed an initial assessment on this suspected stroke patient at 1315.  History  Chief Complaint  Patient presents with   Code Stroke    Joshua Higgins is a 77 y.o. male.  77 yo M with a chief complaints of difficulty with word finding.  He tells me this started this morning.  He woke up and felt normal and then a little bit after breakfast realized that he did not feel right he tried to talk with his wife about it and had trouble describing what was going on.  He denies head injury denies neck pain.  Denies cough congestion or fever.  He did recently change his chronic migraine medicine, had cut it in half and then when he developed a headache this morning took a whole dose.  He denies ever having a problem like this with his migraines.  He does have visual symptoms with his migraines off and on.  The way he describes that his peripheral vision goes out and then comes back.  He denies one-sided numbness or weakness denies difficulty with swallowing.        Home Medications Prior to Admission medications   Medication Sig Start Date End Date Taking? Authorizing Provider  acyclovir (ZOVIRAX) 800 MG tablet Take  1 tablet  Daily  as needed for Fever Blister / Mouth Ulcers Patient not taking: Reported on 07/13/2022 03/20/22   Unk Pinto, MD  amoxicillin (AMOXIL) 500 MG capsule Take 500 mg by mouth. Take prior to dental procedures Patient not taking: Reported on 07/13/2022 03/18/22   [provider]  aspirin EC 81 MG tablet Take 1 tablet (81 mg total) by mouth daily. 02/13/19   Croitoru, Mihai, MD  Blood Glucose Monitoring Suppl DEVI Use kit to check blood sugar once a day 09/03/18   Liane Comber, NP  Cholecalciferol 5000 UNITS TABS Take 1 tablet by mouth daily.    [provider]  Cyanocobalamin (B-12) 1000 MCG SUBL  Place 1 tablet under the tongue daily. 2500 mcg  every other day    [provider]  ezetimibe (ZETIA) 10 MG tablet Take 1 tablet (10 mg total) by mouth daily. 07/29/21 07/01/22  Croitoru, Mihai, MD  famotidine (PEPCID) 20 MG tablet TAKE 1 TABLET BY MOUTH TWICE A DAY 05/12/22   Alycia Rossetti, NP  glucose blood (CONTOUR NEXT TEST) test strip USE TO TEST BLOOD SUGAR ONCE DAILY 12/23/20   Liane Comber, NP  Lancets MISC Use to test blood sugar once daily 03/10/22   Darrol Jump, NP  levothyroxine (SYNTHROID) 50 MCG tablet TAKE 1 TABLET BY MOUTH DAILY ON AN EMPTY STOMACH WITH ONLY WATER FOR 30 MINUTES & NO ANTACID MEDS, CALCIUM OR MAGNESIUM FOR 4 HOURS & AVOID BIOTIN 07/27/21   Liane Comber, NP  metFORMIN (GLUCOPHAGE-XR) 500 MG 24 hr tablet TAKE 2 TABLETS 2 TIMES PER DAY WITH MEALS FOR DIABETES 04/26/22   Alycia Rossetti, NP  Multiple Vitamin (MULTIVITAMIN) tablet Take 1 tablet by mouth at bedtime.     [provider]  Omega-3 Fatty Acids (FISH OIL) 1000 MG CAPS Take 1,000 mg by mouth at bedtime.     [provider]  rosuvastatin (CRESTOR) 10 MG tablet TAKE 1 TABLET 2 TIMES PER WEEK FOR CHOLESTEROL 02/02/22   Alycia Rossetti, NP  triamcinolone (NASACORT) 55 MCG/ACT AERO nasal inhaler Place 1 spray into the  nose daily as needed. Patient not taking: Reported on 07/13/2022 10/22/20 07/01/22  Liane Comber, NP  verapamil (CALAN-SR) 180 MG CR tablet Take 1 tablet (180 mg total) by mouth daily. 07/13/22   Croitoru, Mihai, MD      Allergies    Gemfibrozil, Ace inhibitors, and Lipitor [atorvastatin]    Review of Systems   Review of Systems  Physical Exam Updated Vital Signs BP 139/63   Pulse 70   Temp 98.2 F (36.8 C) (Temporal)   Resp 14   SpO2 98%  Physical Exam Vitals and nursing note reviewed.  Constitutional:      Appearance: He is well-developed.  HENT:     Head: Normocephalic and atraumatic.  Eyes:     Pupils: Pupils are equal, round, and reactive to  light.  Neck:     Vascular: No JVD.  Cardiovascular:     Rate and Rhythm: Normal rate and regular rhythm.     Heart sounds: No murmur heard.    No friction rub. No gallop.  Pulmonary:     Effort: No respiratory distress.     Breath sounds: No wheezing.  Abdominal:     General: There is no distension.     Tenderness: There is no abdominal tenderness. There is no guarding or rebound.  Musculoskeletal:        General: Normal range of motion.     Cervical back: Normal range of motion and neck supple.  Skin:    Coloration: Skin is not pale.     Findings: No rash.  Neurological:     Mental Status: He is alert and oriented to person, place, and time.     Comments: Perhaps asymmetric palate elevation some difficulty with word finding.  Strength appears to be intact.  No facial asymmetry.  Some disconjugate gaze  Psychiatric:        Behavior: Behavior normal.     ED Results / Procedures / Treatments   Labs (all labs ordered are listed, but only abnormal results are displayed) Labs Reviewed  CBC - Abnormal; Notable for the following components:      Result Value   RBC 3.87 (*)    MCV 101.8 (*)    All other components within normal limits  COMPREHENSIVE METABOLIC PANEL - Abnormal; Notable for the following components:   Glucose, Bld 106 (*)    All other components within normal limits  PROTIME-INR  APTT  DIFFERENTIAL  ETHANOL  CBG MONITORING, ED    EKG EKG Interpretation  Date/Time:  Monday July 18 2022 12:53:58 EST Ventricular Rate:  70 PR Interval:  160 QRS Duration: 94 QT Interval:  404 QTC Calculation: 436 R Axis:   -19 Text Interpretation: Normal sinus rhythm Normal ECG No significant change since last tracing Confirmed by Deno Etienne [phone]) on 07/18/2022 1:20:26 PM  Radiology CT HEAD CODE STROKE WO CONTRAST  Result Date: 07/18/2022 CLINICAL DATA:  Code stroke. Neuro deficit, acute, stroke suspected. Aphasia and headache. EXAM: CT HEAD WITHOUT CONTRAST  TECHNIQUE: Contiguous axial images were obtained from the base of the skull through the vertex without intravenous contrast. RADIATION DOSE REDUCTION: This exam was performed according to the departmental dose-optimization program which includes automated exposure control, adjustment of the mA and/or kV according to patient size and/or use of iterative reconstruction technique. COMPARISON:  Head CT 04/03/2013 and MRI 03/02/2022 FINDINGS: Brain: There is no evidence of an acute infarct, intracranial hemorrhage, mass, midline shift, or extra-axial fluid collection. There is mild cerebral atrophy. Confluent  hypodensities in the cerebral white matter bilaterally are nonspecific but compatible with extensive chronic small vessel ischemic disease, similar to the prior MRI though progressed from the 2014 CT. Vascular: Calcified atherosclerosis at the skull base. No hyperdense vessel. Skull: No acute fracture or suspicious osseous lesion. Sinuses/Orbits: Visualized paranasal sinuses and mastoid air cells are clear. Bilateral cataract extraction. Other: None. ASPECTS [facility] Stroke Program Early CT Score) - Ganglionic level infarction (caudate, lentiform nuclei, internal capsule, insula, M1-M3 cortex): 7 - Supraganglionic infarction (M4-M6 cortex): 3 Total score (0-10 with 10 being normal): 10 IMPRESSION: 1. No evidence of acute intracranial abnormality. ASPECTS of 10. 2. Extensive chronic small vessel ischemic disease. These results were called by telephone at the time of interpretation on 07/18/2022 at 1:36 pm to Dr. Deno Etienne, who verbally acknowledged these results. Electronically Signed   By: Logan Bores M.D.   On: 07/18/2022 13:36    Procedures Procedures    Medications Ordered in ED Medications  clopidogrel (PLAVIX) tablet 300 mg (300 mg Oral Given 07/18/22 1408)    Followed by  clopidogrel (PLAVIX) tablet 75 mg (has no administration in time range)  iohexol (OMNIPAQUE) 350 MG/ML injection 100 mL (has no  administration in time range)  sodium chloride flush (NS) 0.9 % injection 3 mL (3 mLs Intravenous Given 07/18/22 1411)  metoCLOPramide (REGLAN) injection 5 mg (5 mg Intravenous Given 07/18/22 1411)  diphenhydrAMINE (BENADRYL) injection 12.5 mg (12.5 mg Intravenous Given 07/18/22 1410)    ED Course/ Medical Decision Making/ A&P                             Medical Decision Making Amount and/or Complexity of Data Reviewed Labs: ordered. Radiology: ordered.  Risk Prescription drug management. Decision regarding hospitalization.   77 yo M with a chief complaints of aphasia.  Patient is having some difficulty with word finding.  This started this morning.  He is wife lasted him normal last night but he tells me that he felt normal this morning and then did not feel well just after breakfast and had tried to discuss it with his wife.  Will activate a code stroke.  CT of the head negative for acute pathology as independently interpreted by me.  Discussed with the radiologist.  No significant electrolyte abnormality.  No anemia.  Case was discussed with neurology, Dr. Maurine Minister, recommends TIA obs.  Will discuss with medicine.  The patients results and plan were reviewed and discussed.   Any x-rays performed were independently reviewed by myself.   Differential diagnosis were considered with the presenting HPI.  Medications  clopidogrel (PLAVIX) tablet 300 mg (300 mg Oral Given 07/18/22 1408)    Followed by  clopidogrel (PLAVIX) tablet 75 mg (has no administration in time range)  iohexol (OMNIPAQUE) 350 MG/ML injection 100 mL (has no administration in time range)  sodium chloride flush (NS) 0.9 % injection 3 mL (3 mLs Intravenous Given 07/18/22 1411)  metoCLOPramide (REGLAN) injection 5 mg (5 mg Intravenous Given 07/18/22 1411)  diphenhydrAMINE (BENADRYL) injection 12.5 mg (12.5 mg Intravenous Given 07/18/22 1410)    Vitals:   07/18/22 1250 07/18/22 1345 07/18/22 1400  BP: (!) 152/79 (!)  146/67 139/63  Pulse: 73 71 70  Resp: '16 15 14  '$ Temp: 98.2 F (36.8 C)    TempSrc: Temporal    SpO2: 100% 98% 98%    Final diagnoses:  TIA (transient ischemic attack)    Admission/ observation were discussed with  the admitting physician, patient and/or family and they are comfortable with the plan.          Final Clinical Impression(s) / ED Diagnoses Final diagnoses:  TIA (transient ischemic attack)    Rx / DC Orders ED Discharge Orders     None         Deno Etienne, DO 07/18/22 1412

## 2022-07-18 NOTE — Hospital Course (Signed)
Joshua Higgins was admitted to the hospital with the working diagnosis of TIA.    77 y.o. male with medical history significant of hypertension, T2DM, and hypothyroidism who presented with difficulty speaking. Patient has been at his usual sate of health until the morning of presentation when he noticed difficulty speaking, producing mumbling sounds instead of words. Apparently he woke up with this neurologic deficit.   His wife noticed the changes and call patient's primary care provider. She was advised to call EMS. Patient was aphasic and was transported to the ED. At the time of his evaluation his blood pressure was 146/80, HR 70, RR 22 and 02 saturation 98%, neurological non focal, lungs with no wheezing or rales, heart with S1 and S2 present and rhythmic, abdomen with no distention and no lower extremity edema.    Na 138, K 4.2 Cl 102 bicarbonate 25 glucose 106 bun 16 cr 1,0 Wbc 6.8 hgb 13.1 plt 171   Head CT with no acute intracranial abnormality.  Extensive chronic small vessel ischemic diease.    Ct angiography head and neck with motion degraded study. Within this limitations is less than 50 % stenosis of the proximal right ICA and moderate stenosis of the right cavernous and left clinoid segments of the ICA.  Aortic atherosclerosis    EKG 70 bpm, normal axis, normal intervals, sinus rhythm with no significant ST segment or T wave changes.   Patient was evaluated by tele-neurology, recommended to start on antiplatelet therapy and admitted for further workup.   01/16 Continue neurologically non focal, brain MRI with no acute findings. Patient will continue antiplatelet therapy and blood pressure control. Follow up as outpatient.

## 2022-07-18 NOTE — Assessment & Plan Note (Signed)
Continue with levothyroxine  

## 2022-07-18 NOTE — Assessment & Plan Note (Addendum)
Continue metformin for glucose control Hgb A1c is 6.1  Continue with statin therapy.  LLD 53 with HDL 51 triglycerides 75

## 2022-07-18 NOTE — Progress Notes (Signed)
Code stroke activated per elert @ 3570.  Pt to CT @ 1316.  Dr Curly Shores paged at 1320.  On camera to assess patient @ 1230.  Pt returned from CT @ 1320.  MRS 1, no TNK   Insurance claims handler

## 2022-07-18 NOTE — Assessment & Plan Note (Addendum)
Patient was placed on verapamil with improvement in his blood pressure. Follow up as outpatient.

## 2022-07-18 NOTE — Assessment & Plan Note (Signed)
No chest pain, continue with statin and antiplatelet therapy,

## 2022-07-18 NOTE — Consult Note (Signed)
[facility] Telemedicine Consult   Requesting Provider: Deno Etienne Consult Participants: Myself, patient, bedside nurse, atrium nurse, patient's wife Location of the provider: [facility] hospital Location of the patient: [facility] ED  This consult was provided via telemedicine with 2-way video and audio communication. The patient/family was informed that care would be provided in this way and agreed to receive care in this manner.    Chief Complaint: Difficulty speaking  HPI: Joshua Higgins  is a 77 year old gentleman with a past medical history significant for hypertension, hyperlipidemia, diabetes, coronary artery disease s/p CABG, former smoking, cervicalgia, migraine headaches, BMI 25.05, hypothyroidism, CKD stage II due to diabetes, statin myopathy on daily statin (tolerates rosuvastatin taken twice weekly)  He was in his usual state of health yesterday.  At this morning his wife had not really spoken to him until about 10 AM when he was saying that he did not feel right and had a great deal of difficulty with his words.  This was associated with a mild headache and possibly some visual field changes that he has had in the past with migraines  He has had atypical migraine with visual field cuts which have improved with verapamil.  He was also trialed on topiramate in the past but this was associated with cognitive impairment (August 2023) and was therefore discontinued, at that time MRI brain was reassuring against acute process.  Notably his cardiologist did reduce his verapamil to 180 mg daily from 240 mg daily due to fatigue (07/13/2021)  LKW: 10:30 PM on 1/14 Thrombolytic given?: No, out of the window IR Thrombectomy? No, symptoms resolved Modified Rankin Scale: 0-Completely asymptomatic and back to baseline post- stroke Time of teleneurologist evaluation: 1:25 PM  Exam: Current vital signs: BP (!) 146/67   Pulse 71   Temp 98.2 F (36.8 C) (Temporal)   Resp 15   SpO2  98%  Vital signs in last 24 hours: Temp:  [98.2 F (36.8 C)] 98.2 F (36.8 C) (01/15 1250) Pulse Rate:  [71-73] 71 (01/15 1345) Resp:  [15-16] 15 (01/15 1345) BP: (146-152)/(67-79) 146/67 (01/15 1345) SpO2:  [98 %-100 %] 98 % (01/15 1345)    General: Comfortable, pleasant, no acute distress Pulmonary: breathing comfortably Cardiac: regular rate and rhythm on monitor   NIH Stroke scale 1A: Level of Consciousness - 0 1B: Ask Month and Age - 0 1C: 'Blink Eyes' & 'Squeeze Hands' - 0 2: Test Horizontal Extraocular Movements - 0 3: Test Visual Fields - 0 4: Test Facial Palsy - 0 5A: Test Left Arm Motor Drift - 0 5B: Test Right Arm Motor Drift - 0 (did have some pronation of the right upper extremity without drift) 6A: Test Left Leg Motor Drift - 0 6B: Test Right Leg Motor Drift - 0 7: Test Limb Ataxia - 0 8: Test Sensation - 0 9: Test Language/Aphasia- 1 (some mild word finding difficulty on casual speech and difficulty repeating complex sentences on formal testing) 10: Test Dysarthria - 0 11: Test Extinction/Inattention - 0 NIHSS score: 1   Imaging Reviewed:   Head CT personally reviewed, agree with radiology no clear acute intracranial process but there is microvascular change 1. No evidence of acute intracranial abnormality. ASPECTS of 10. 2. Extensive chronic small vessel ischemic disease.   Labs reviewed in epic and pertinent values follow:  Basic Metabolic Panel: Recent Labs  Lab 07/12/22 0000 07/18/22 1300  NA 142 138  K 4.7 4.2  CL 104 102  CO2 29 25  GLUCOSE 118*  106*  BUN 18 16  CREATININE 0.93 1.02  CALCIUM 9.8 9.7    CBC: Recent Labs  Lab 07/12/22 0000 07/18/22 1300  WBC 5.5 6.8  NEUTROABS 2,151 3.4  HGB 12.6* 13.1  HCT 37.7* 39.4  MCV 102.2* 101.8*  PLT 179 171    Coagulation Studies: Recent Labs    07/18/22 1300  LABPROT 13.4  INR 1.0    Lab Results  Component Value Date   HGBA1C 6.1 (H) 07/12/2022    Lab Results  Component  Value Date   CHOL 138 07/12/2022   HDL 69 07/12/2022   LDLCALC 52 07/12/2022   TRIG 85 07/12/2022   CHOLHDL 2.0 07/12/2022     Assessment: 77 year old gentleman with multiple vascular risk factors as documented above as well and has a history of migraine headaches associated with visual field disturbances but not previously associated with any speech finding difficulties.  Differential includes complex migraine or stroke/TIA.  Given his substantial risk factors I do think a full TIA workup is indicated  Recommendations:  # Transient speech difficulty - No need to repeat stroke labs given recent labs 1 week ago as documented above, patient is meeting LDL and A1c goals - MRI brain  - CTA head/neck - Frequent neuro checks - Echocardiogram - Continue home aspirin 81 mg daily - Plavix 300 mg load with 75 mg daily, course to be determined pending workup - Risk factor modification - Telemetry monitoring - Blood pressure goal   - Permissive hypertension to 220/120 due to acute stroke; if MRI is negative for stroke then the goal is normotension to be achieved gradually - PT consult, OT consult, Speech consult not indicated at this time as patient is at his baseline - Stroke team to follow in consultation at [facility]  This patient is receiving care for possible acute neurological changes. There was 35 minutes of care by this provider at the time of service, including time for direct evaluation via telemedicine, review of medical records, imaging studies and discussion of findings with providers, the patient and/or family.  Lesleigh Noe MD-PhD [facility] [phone]   If 8pm-8am, please page neurology on call as listed in [place].  CRITICAL CARE Performed by: Lorenza Chick   Total critical care time: 35 minutes  Critical care time was exclusive of separately billable procedures and treating other patients.  Critical care was necessary to treat or prevent  imminent or life-threatening deterioration; emergent evaluation for consideration of thrombectomy or thrombolytic.  Critical care was time spent personally by me on the following activities: development of treatment plan with patient and/or surrogate as well as nursing, discussions with consultants, evaluation of patient's response to treatment, examination of patient, obtaining history from patient or surrogate, ordering and performing treatments and interventions, ordering and review of laboratory studies, ordering and review of radiographic studies, pulse oximetry and re-evaluation of patient's condition.

## 2022-07-19 ENCOUNTER — Observation Stay (HOSPITAL_BASED_OUTPATIENT_CLINIC_OR_DEPARTMENT_OTHER): Payer: Medicare Other

## 2022-07-19 ENCOUNTER — Other Ambulatory Visit: Payer: Self-pay | Admitting: Physician Assistant

## 2022-07-19 ENCOUNTER — Observation Stay (HOSPITAL_COMMUNITY): Payer: Medicare Other

## 2022-07-19 ENCOUNTER — Other Ambulatory Visit (HOSPITAL_COMMUNITY): Payer: Self-pay

## 2022-07-19 DIAGNOSIS — I4891 Unspecified atrial fibrillation: Secondary | ICD-10-CM

## 2022-07-19 DIAGNOSIS — I25810 Atherosclerosis of coronary artery bypass graft(s) without angina pectoris: Secondary | ICD-10-CM

## 2022-07-19 DIAGNOSIS — E039 Hypothyroidism, unspecified: Secondary | ICD-10-CM | POA: Diagnosis not present

## 2022-07-19 DIAGNOSIS — G458 Other transient cerebral ischemic attacks and related syndromes: Secondary | ICD-10-CM

## 2022-07-19 DIAGNOSIS — K219 Gastro-esophageal reflux disease without esophagitis: Secondary | ICD-10-CM | POA: Diagnosis not present

## 2022-07-19 DIAGNOSIS — G459 Transient cerebral ischemic attack, unspecified: Secondary | ICD-10-CM | POA: Diagnosis not present

## 2022-07-19 LAB — ECHOCARDIOGRAM COMPLETE
AR max vel: 2.35 cm2
AV Area VTI: 2.21 cm2
AV Area mean vel: 2.37 cm2
AV Mean grad: 3 mmHg
AV Peak grad: 5.5 mmHg
Ao pk vel: 1.17 m/s
Area-P 1/2: 5.75 cm2
MV VTI: 1.98 cm2
S' Lateral: 2.9 cm

## 2022-07-19 LAB — CBC
HCT: 35.3 % — ABNORMAL LOW (ref 39.0–52.0)
Hemoglobin: 11.9 g/dL — ABNORMAL LOW (ref 13.0–17.0)
MCH: 34.6 pg — ABNORMAL HIGH (ref 26.0–34.0)
MCHC: 33.7 g/dL (ref 30.0–36.0)
MCV: 102.6 fL — ABNORMAL HIGH (ref 80.0–100.0)
Platelets: 151 10*3/uL (ref 150–400)
RBC: 3.44 MIL/uL — ABNORMAL LOW (ref 4.22–5.81)
RDW: 12.6 % (ref 11.5–15.5)
WBC: 4.5 10*3/uL (ref 4.0–10.5)
nRBC: 0 % (ref 0.0–0.2)

## 2022-07-19 LAB — LIPID PANEL
Cholesterol: 119 mg/dL (ref 0–200)
HDL: 51 mg/dL (ref >40)
LDL Cholesterol: 53 mg/dL (ref 0–99)
Total CHOL/HDL Ratio: 2.3 RATIO
Triglycerides: 75 mg/dL (ref <150)
VLDL: 15 mg/dL (ref 0–40)

## 2022-07-19 LAB — BASIC METABOLIC PANEL
Anion gap: 7 (ref 5–15)
BUN: 15 mg/dL (ref 8–23)
CO2: 28 mmol/L (ref 22–32)
Calcium: 9.1 mg/dL (ref 8.9–10.3)
Chloride: 103 mmol/L (ref 98–111)
Creatinine, Ser: 1.01 mg/dL (ref 0.61–1.24)
GFR, Estimated: >60 mL/min (ref >60)
Glucose, Bld: 101 mg/dL — ABNORMAL HIGH (ref 70–99)
Potassium: 4.4 mmol/L (ref 3.5–5.1)
Sodium: 138 mmol/L (ref 135–145)

## 2022-07-19 MED ORDER — VERAPAMIL HCL ER 240 MG PO TBCR
240.0000 mg | EXTENDED_RELEASE_TABLET | Freq: Every day | ORAL | 0 refills | Status: DC
  Filled 2022-07-19: qty 30, 30d supply, fill #0

## 2022-07-19 MED ORDER — PERFLUTREN LIPID MICROSPHERE
1.0000 mL | INTRAVENOUS | Status: AC | PRN
  Administered 2022-07-19: 3 mL via INTRAVENOUS

## 2022-07-19 MED ORDER — ASPIRIN EC 81 MG PO TBEC: 81.0000 mg | DELAYED_RELEASE_TABLET | Freq: Every day | ORAL | Status: AC

## 2022-07-19 MED ORDER — CLOPIDOGREL BISULFATE 75 MG PO TABS
75.0000 mg | ORAL_TABLET | Freq: Every day | ORAL | 0 refills | Status: DC
  Filled 2022-07-19: qty 30, 30d supply, fill #0

## 2022-07-19 MED ORDER — VERAPAMIL HCL ER 240 MG PO TBCR: 240.0000 mg | EXTENDED_RELEASE_TABLET | Freq: Every day | ORAL | Status: DC

## 2022-07-19 NOTE — Progress Notes (Signed)
  Echocardiogram 2D Echocardiogram has been performed.  Joshua Higgins 07/19/2022, 11:01 AM

## 2022-07-19 NOTE — Progress Notes (Signed)
Secure message sent to DR Opyd, informed about the concern of pt about his regular metformin dose that need to be taken today, it was already discussed in AM shift but it was ordered to  start on 07/20/22, Dr Myna Hidalgo, answered that the metformin is not yet needed at this time, message relayed to pt and pt agreed

## 2022-07-19 NOTE — Progress Notes (Signed)
Joshua Higgins to be D/C'd home per MD order. Virtual [employer] nutritionist discussed discharge with the patient, wife, and daughter, and all questions fully answered. TOC meds delivered to bedside. Skin clean, dry and intact without evidence of skin break down, no evidence of skin tears noted.  IV catheter discontinued intact. Site without signs and symptoms of complications. Dressing and pressure applied.  An After Visit Summary was printed and given to the patient.  Patient escorted via [place], and D/C home via private auto.  Melonie [place]  07/19/2022 3:19 PM

## 2022-07-19 NOTE — Evaluation (Signed)
Occupational Therapy Evaluation Patient Details Name: Joshua Higgins MRN: [id number] DOB: [birth year] Today's Date: 07/19/2022   History of Present Illness 77 y.o. male who presented 07/18/22 with difficulty speaking. CT head with no acute changes and CT angio of neck and head with no significant vessel stenosis. MRI (-) PMH significant of hypertension, T2DM, and hypothyroidism   Clinical Impression   PTA pt lives independently at home with his wife and is very active, drives and does yardwork. Wife/pt feel his speech has returned to "normal". Noted difficulty with memory, however wife/pt feel this is "normal aging". Recommend initial S with medication and financial management. Reviewed signs/symptoms of CVA using BeFast. Pt/wife verbalized understanding. No further OT needed.       Recommendations for follow up therapy are one component of a multi-disciplinary discharge planning process, led by the attending physician.  Recommendations may be updated based on patient status, additional functional criteria and insurance authorization.   Follow Up Recommendations  No OT follow up     Assistance Recommended at Discharge Intermittent Supervision/Assistance  Patient can return home with the following Direct supervision/assist for medications management;Direct supervision/assist for financial management    Functional Status Assessment  Patient has not had a recent decline in their functional status  Equipment Recommendations  None recommended by OT    Recommendations for Other Services       Precautions / Restrictions Precautions Precautions: None      Mobility Bed Mobility Overal bed mobility: Independent                  Transfers Overall transfer level: Independent                        Balance Overall balance assessment: Modified Independent                               Standardized Balance Assessment Standardized Balance Assessment :  Dynamic Gait Index   Dynamic Gait Index Level Surface: Normal Change in Gait Speed: Normal Gait with Horizontal Head Turns: Normal Gait with Vertical Head Turns: Mild Impairment Gait and Pivot Turn: Normal Step Over Obstacle: Normal Step Around Obstacles: Normal Steps: Mild Impairment Total Score: 22     ADL either performed or assessed with clinical judgement   ADL Overall ADL's : At baseline                                             Vision   Additional Comments: States he sometimes "sees things moving/people" in his R visual field; sees flashes of light at times; visual field testing intact subjectively. Follows regularly with an eye doctor     Perception Perception Perception Tested?: Yes (no deficits noted)   Praxis Praxis Praxis tested?: Within functional limits    Pertinent Vitals/Pain Pain Assessment Pain Assessment: No/denies pain     Hand Dominance Right   Extremity/Trunk Assessment Upper Extremity Assessment Upper Extremity Assessment: Overall WFL for tasks assessed   Lower Extremity Assessment Lower Extremity Assessment: Overall WFL for tasks assessed ("some numbness" on plantar surface due to "diabetes")   Cervical / Trunk Assessment Cervical / Trunk Assessment: Normal   Communication Communication Communication: No difficulties   Cognition Arousal/Alertness: Awake/alert Behavior During Therapy: WFL for tasks assessed/performed Overall Cognitive Status: Within Functional Limits  for tasks assessed                                 General Comments: noted difficulty with memory; REceived a score of 4 on the Short Blessed test - ablet o recall 3/5 words. Increased time for processing. REviewed signs.symptoms of CVA using BeFat and pt did not remember any symptoms     General Comments       Exercises     Shoulder Instructions      Home Living Family/patient expects to be discharged to:: Private  residence Living Arrangements: Spouse/significant other Available Help at Discharge: Family;Available 24 hours/day Type of Home: House Home Access: Stairs to enter [employer] of Steps: 2 Entrance Stairs-Rails: Right;Left;Can reach both Home Layout: Two level;Laundry or work area in basement;Able to live on main level with bedroom/bathroom Alternate Therapist, sports of Steps: 12 Alternate Level Stairs-Rails: Right;Left Bathroom Shower/Tub: Occupational psychologist: Handicapped height Bathroom Accessibility: Yes How Accessible: Accessible via walker Home Equipment: Cane - single point;Shower seat - built in;Grab bars - Statistician (2 wheels)          Prior Functioning/Environment Prior Level of Function : Independent/Modified Independent;Driving                        OT Problem List:        OT Treatment/Interventions:      OT Goals(Current goals can be found in the care plan section) Acute Rehab OT Goals Patient Stated Goal: to go home OT Goal Formulation: All assessment and education complete, DC therapy  OT Frequency:      Co-evaluation              AM-PAC OT "6 Clicks" Daily Activity     Outcome Measure Help from another person eating meals?: None Help from another person taking care of personal grooming?: None Help from another person toileting, which includes using toliet, bedpan, or urinal?: None Help from another person bathing (including washing, rinsing, drying)?: None Help from another person to put on and taking off regular upper body clothing?: None Help from another person to put on and taking off regular lower body clothing?: None 6 Click Score: 24   End of Session Equipment Utilized During Treatment: Gait belt Nurse Communication: Mobility status  Activity Tolerance: Patient tolerated treatment well Patient left: in bed;with call bell/phone within reach;with family/visitor present  OT Visit  Diagnosis: Cognitive communication deficit (R41.841)                Time: 1245-8099 OT Time Calculation (min): 25 min Charges:  OT General Charges $OT Visit: 1 Visit OT Evaluation $OT Eval Low Complexity: [place], OT/L   Acute OT Clinical Specialist Acute Rehabilitation Services Pager [phone] Office [phone]   [facility] 07/19/2022, 9:20 AM

## 2022-07-19 NOTE — TOC Initial Note (Signed)
Transition of Care [facility]) - Initial/Assessment Note    Patient Details  Name: Joshua Higgins MRN: [id number] Date of Birth: 05-06-1946  Transition of Care [facility]) CM/SW Contact:    Ninfa Meeker, RN Phone Number: 07/19/2022, 10:21 AM  Clinical Narrative:                 Transition of Care Screening Note:  Transition of Care [facility]) Department has reviewed patient and no TOC needs have been identified at this time. We will continue to monitor patient advancement through Interdisciplinary progressions and if new patient needs arise, please place a consult.        Patient Goals and CMS Choice            Expected Discharge Plan and Services                                              Prior Living Arrangements/Services                       Activities of Daily Living      Permission Sought/Granted                  Emotional Assessment              Admission diagnosis:  TIA (transient ischemic attack) [G45.9] Patient Active Problem List   Diagnosis Date Noted   TIA (transient ischemic attack) 07/18/2022   COVID-19 (01/29/2021) 02/02/2021   B12 deficiency 03/26/2020   Macrocytic anemia 09/20/2019   History of Mohs micrographic surgery for skin cancer 09/19/2019   Former smoker 05/14/2018   CKD stage 2 due to type 2 diabetes mellitus ([place]) 02/02/2018   BMI 24.0-24.9, adult 02/02/2018   Atherosclerosis of abdominal aorta ([place]) by CXR in 2017  02/12/2016   Hypothyroidism 11/12/2015   CAD s/p CABG 09/29/2015   Medication management 09/24/2013   Vitamin D deficiency 05/27/2013   [name] w/Stage 2 CKD ([place])    Hyperlipidemia associated with type 2 diabetes mellitus ([place])    Hypertension    GERD (gastroesophageal reflux disease)    IBS (irritable bowel syndrome)    PCP:  Unk Pinto, MD Pharmacy:   [id number] [place], [place] - [address] [place]  [id number]: [phone]: [id number] [address]: [phone]: [phone]    Social Determinants of Health (SDOH) Social History: SDOH Screenings   Depression (PHQ2-9): Low Risk  (02/25/2022)  Tobacco Use: Medium Risk (07/18/2022)   SDOH Interventions:     Readmission Risk Interventions     No data to display

## 2022-07-19 NOTE — Discharge Summary (Addendum)
Physician Discharge Summary   Patient: ADONYS Higgins MRN: [id number] DOB: [birth year]  Admit date:     07/18/2022  Discharge date: 07/19/22  Discharge Physician: Jimmy Picket [facility]   PCP: Unk Pinto, MD   Recommendations at discharge:    Patient will continue taking dual antiplatelet therapy with aspirin and clopidogrel for 3 weeks and then continue with clopidogrel alone Resume full dose of verapamil per neurology recommendations Continue with statin therapy Follow up with Dr Melford Aase in 7 to 10 days Follow up with Neurology as scheduled Follow up with Cardiology as scheduled.   I spoke with patient's daughter and wife at the bedside, we talked in detail about patient's condition, plan of care and prognosis and all questions were addressed.   Discharge Diagnoses: Principal Problem:   TIA (transient ischemic attack) Active Problems:   Hypertension   CAD s/p CABG   CKD stage 2 due to type 2 diabetes mellitus ([place])   Hyperlipidemia associated with type 2 diabetes mellitus (HCC)   GERD (gastroesophageal reflux disease)   Hypothyroidism  Resolved Problems:   * No resolved hospital problems. [facility] Course: Mr. Burzynski was admitted to the hospital with the working diagnosis of TIA.    77 y.o. male with medical history significant of hypertension, T2DM, and hypothyroidism who presented with difficulty speaking. Patient has been at his usual sate of health until the morning of presentation when he noticed difficulty speaking, producing mumbling sounds instead of words. Apparently he woke up with this neurologic deficit.   His wife noticed the changes and call patient's primary care provider. She was advised to call EMS. Patient was aphasic and was transported to the ED. At the time of his evaluation his blood pressure was 146/80, HR 70, RR 22 and 02 saturation 98%, neurological non focal, lungs with no wheezing or rales, heart with S1 and S2 present and rhythmic,  abdomen with no distention and no lower extremity edema.    Na 138, K 4.2 Cl 102 bicarbonate 25 glucose 106 bun 16 cr 1,0 Wbc 6.8 hgb 13.1 plt 171   Head CT with no acute intracranial abnormality.  Extensive chronic small vessel ischemic diease.    Ct angiography head and neck with motion degraded study. Within this limitations is less than 50 % stenosis of the proximal right ICA and moderate stenosis of the right cavernous and left clinoid segments of the ICA.  Aortic atherosclerosis    EKG 70 bpm, normal axis, normal intervals, sinus rhythm with no significant ST segment or T wave changes.   Patient was evaluated by tele-neurology, recommended to start on antiplatelet therapy and admitted for further workup.   01/16 Continue neurologically non focal, brain MRI with no acute findings. Patient will continue antiplatelet therapy and blood pressure control. Follow up as outpatient.   Assessment and Plan: * TIA (transient ischemic attack) Focal neurologic deficit has improved. CT head with no acute changes and CT angio of neck and head with no significant vessel stenosis.  Follow up brain MRI with no acute changes.   Continue blood pressure  control with verapamil. Antiplatelet therapy with aspirin and clopidogrel for 3 weeks then continue with clopidogrel alone.  Statin therapy  Hypertension Patient was placed on verapamil with improvement in his blood pressure. Follow up as outpatient.   CAD s/p CABG No chest pain, continue with statin and antiplatelet therapy,  CKD stage 2 due to type 2 diabetes mellitus ([place]) Renal function stable and electrolytes within normal limits.  Hyperlipidemia associated with type 2 diabetes mellitus ([place]) Continue metformin for glucose control Hgb A1c is 6.1  Continue with statin therapy.  LLD 53 with HDL 51 triglycerides 75   GERD (gastroesophageal reflux disease) Continue antiacid therapy.   Hypothyroidism Continue with levothyroxine           Consultants: Neurology  Procedures performed: none   Disposition: Home Diet recommendation:  Cardiac and Carb modified diet DISCHARGE MEDICATION: Allergies as of 07/19/2022       Reactions   Gemfibrozil Other (See Comments)   Muscle aches   Ace Inhibitors Cough   Lipitor [atorvastatin] Other (See Comments)   Joint pain.        Medication List     STOP taking these medications    acyclovir 800 MG tablet Commonly known as: ZOVIRAX   amoxicillin 500 MG capsule Commonly known as: AMOXIL       TAKE these medications    aspirin EC 81 MG tablet Take 1 tablet (81 mg total) by mouth daily for 21 days.   B-12 1000 MCG Subl Place 1 tablet under the tongue daily. 2500 mcg  every other day   Blood Glucose Monitoring Suppl Devi Use kit to check blood sugar once a day   Cholecalciferol 125 MCG (5000 UT) Tabs Take 1 tablet by mouth daily.   clopidogrel 75 MG tablet Commonly known as: PLAVIX Take 1 tablet (75 mg total) by mouth daily. Start taking on: July 20, 2022   Contour Next Test test strip Generic drug: glucose blood USE TO TEST BLOOD SUGAR ONCE DAILY   ezetimibe 10 MG tablet Commonly known as: ZETIA Take 1 tablet (10 mg total) by mouth daily.   famotidine 20 MG tablet Commonly known as: PEPCID TAKE 1 TABLET BY MOUTH TWICE A DAY What changed:  when to take this reasons to take this   Fish Oil 1000 MG Caps Take 1,000 mg by mouth at bedtime.   Lancets Misc Use to test blood sugar once daily   levothyroxine 50 MCG tablet Commonly known as: SYNTHROID TAKE 1 TABLET BY MOUTH DAILY ON AN EMPTY STOMACH WITH ONLY WATER FOR 30 MINUTES & NO ANTACID MEDS, CALCIUM OR MAGNESIUM FOR 4 HOURS & AVOID BIOTIN What changed:  how much to take how to take this when to take this additional instructions   metFORMIN 500 MG 24 hr tablet Commonly known as: GLUCOPHAGE-XR TAKE 2 TABLETS 2 TIMES PER DAY WITH MEALS FOR DIABETES What changed: See the new  instructions.   multivitamin tablet Take 1 tablet by mouth at bedtime.   rosuvastatin 10 MG tablet Commonly known as: CRESTOR TAKE 1 TABLET 2 TIMES PER WEEK FOR CHOLESTEROL What changed:  how much to take how to take this when to take this additional instructions   triamcinolone 55 MCG/ACT Aero nasal inhaler Commonly known as: NASACORT Place 1 spray into the nose daily as needed.   verapamil 240 MG CR tablet Commonly known as: CALAN-SR Take 1 tablet (240 mg total) by mouth daily. Start taking on: July 20, 2022 What changed:  medication strength how much to take               Durable Medical Equipment  (From admission, onward)           Start     Ordered   07/19/22 0903  For home use only DME Other see comment  Once       Comments: Please evaluate for POC, (portable 02 concentrator)  Question:  Length of Need  Answer:  12 Months   07/19/22 0902            Discharge Exam: Filed Weights   07/19/22 1152  Weight: 78.9 kg   BP 129/68 (BP Location: Right Arm)   Pulse 76   Temp 98.4 F (36.9 C) (Oral)   Resp 19   Ht '5\' 11"'$  (1.803 m)   Wt 78.9 kg   SpO2 98%   BMI 24.27 kg/m   Patient is feeling well, no speech disturbance  Neurology non focal. ENT with no pallor Cardiovascular with S1 and S2 present and rhythmic Respiratory with no rales or wheezing Abdomen with no distention  No lower extremity edema   Condition at discharge: stable  The results of significant diagnostics from this hospitalization (including imaging, microbiology, ancillary and laboratory) are listed below for reference.   Imaging Studies: ECHOCARDIOGRAM COMPLETE  Result Date: 07/19/2022    ECHOCARDIOGRAM REPORT   Patient Name:   ABU HEAVIN Date of Exam: 07/19/2022 Medical Rec #:  [id number]         Height:       70.0 in Accession #:    [id number]        Weight:       174.6 lb Date of Birth:  February 13, 1946          BSA:          1.970 m Patient Age:    9 years           BP:           115/67 mmHg Patient Gender: M                 HR:           81 bpm. Exam Location:  Inpatient Procedure: 2D Echo, Cardiac Doppler, Color Doppler and Intracardiac            Opacification Agent Indications:    TIA  History:        Patient has prior history of Echocardiogram examinations, most                 recent 02/19/2019. CAD, Prior CABG, Signs/Symptoms:Chest Pain;                 Risk Factors:Hypertension, Diabetes, Dyslipidemia and Former                 Smoker.  Sonographer:    Clayton Lefort RDCS (AE) Referring Phys: [id number] [place]  Sonographer Comments: Technically difficult study due to poor echo windows and suboptimal subcostal window. IMPRESSIONS  1. Left ventricular ejection fraction, by estimation, is 55 to 60%. The left ventricle has normal function. The left ventricle has no regional wall motion abnormalities. There is mild concentric left ventricular hypertrophy. Left ventricular diastolic parameters are consistent with Grade I diastolic dysfunction (impaired relaxation).  2. Right ventricular systolic function is normal. The right ventricular size is normal.  3. The mitral valve is degenerative. No evidence of mitral valve regurgitation.  4. The aortic valve is tricuspid. There is mild calcification of the aortic valve. There is mild thickening of the aortic valve. Aortic valve regurgitation is not visualized. Aortic valve sclerosis/calcification is present, without any evidence of aortic stenosis.  5. The inferior vena cava is normal in size with greater than 50% respiratory variability, suggesting right atrial pressure of 3 mmHg. Comparison(s): No prior Echocardiogram. FINDINGS  Left Ventricle: Left ventricular ejection fraction, by estimation,  is 55 to 60%. The left ventricle has normal function. The left ventricle has no regional wall motion abnormalities. Definity contrast agent was given IV to delineate the left ventricular  endocardial borders. The left ventricular  internal cavity size was normal in size. There is mild concentric left ventricular hypertrophy. Left ventricular diastolic parameters are consistent with Grade I diastolic dysfunction (impaired relaxation). Right Ventricle: The right ventricular size is normal. No increase in right ventricular wall thickness. Right ventricular systolic function is normal. Left Atrium: Left atrial size was normal in size. Right Atrium: Right atrial size was normal in size. Pericardium: There is no evidence of pericardial effusion. Mitral Valve: The mitral valve is degenerative in appearance. There is mild thickening of the mitral valve leaflet(s). There is mild calcification of the mitral valve leaflet(s). Mild to moderate mitral annular calcification. No evidence of mitral valve regurgitation. MV peak gradient, 6.1 mmHg. The mean mitral valve gradient is 2.0 mmHg. Tricuspid Valve: The tricuspid valve is normal in structure. Tricuspid valve regurgitation is trivial. Aortic Valve: The aortic valve is tricuspid. There is mild calcification of the aortic valve. There is mild thickening of the aortic valve. Aortic valve regurgitation is not visualized. Aortic valve sclerosis/calcification is present, without any evidence of aortic stenosis. Aortic valve mean gradient measures 3.0 mmHg. Aortic valve peak gradient measures 5.5 mmHg. Aortic valve area, by VTI measures 2.21 cm. Pulmonic Valve: The pulmonic valve was normal in structure. Pulmonic valve regurgitation is trivial. Aorta: The aortic root and ascending aorta are structurally normal, with no evidence of dilitation. Venous: The inferior vena cava is normal in size with greater than 50% respiratory variability, suggesting right atrial pressure of 3 mmHg. IAS/Shunts: The atrial septum is grossly normal.  LEFT VENTRICLE PLAX 2D LVIDd:         4.30 cm   Diastology LVIDs:         2.90 cm   LV e' medial:    6.68 cm/s LV PW:         1.10 cm   LV E/e' medial:  11.7 LV IVS:        1.10 cm    LV e' lateral:   9.64 cm/s LVOT diam:     2.10 cm   LV E/e' lateral: 8.1 LV SV:         47 LV SV Index:   24 LVOT Area:     3.46 cm  RIGHT VENTRICLE RV Basal diam:  2.50 cm RV S prime:     12.40 cm/s TAPSE (M-mode): 1.7 cm LEFT ATRIUM           Index        RIGHT ATRIUM           Index LA diam:      2.70 cm 1.37 cm/m   RA Area:     11.70 cm LA Vol (A2C): 16.2 ml 8.22 ml/m   RA Volume:   21.70 ml  11.01 ml/m LA Vol (A4C): 30.2 ml 15.33 ml/m  AORTIC VALVE AV Area (Vmax):    2.35 cm AV Area (Vmean):   2.37 cm AV Area (VTI):     2.21 cm AV Vmax:           117.00 cm/s AV Vmean:          76.000 cm/s AV VTI:            0.213 m AV Peak Grad:      5.5 mmHg AV Mean Grad:  3.0 mmHg LVOT Vmax:         79.50 cm/s LVOT Vmean:        51.900 cm/s LVOT VTI:          0.136 m LVOT/AV VTI ratio: 0.64  AORTA Ao Root diam: 3.10 cm Ao Asc diam:  3.10 cm MITRAL VALVE MV Area (PHT): 5.75 cm     SHUNTS MV Area VTI:   1.98 cm     Systemic VTI:  0.14 m MV Peak grad:  6.1 mmHg     Systemic Diam: 2.10 cm MV Mean grad:  2.0 mmHg MV Vmax:       1.23 m/s MV Vmean:      68.7 cm/s MV Decel Time: 132 msec MV E velocity: 77.90 cm/s MV A velocity: 125.00 cm/s MV E/A ratio:  0.62 Gwyndolyn Kaufman MD Electronically signed by Gwyndolyn Kaufman MD Signature Date/Time: 07/19/2022/12:02:46 PM    Final    MR BRAIN WO CONTRAST  Result Date: 07/19/2022 CLINICAL DATA:  Transient ischemic attack EXAM: MRI HEAD WITHOUT CONTRAST TECHNIQUE: Multiplanar, multiecho pulse sequences of the brain and surrounding structures were obtained without intravenous contrast. COMPARISON:  03/02/2022 FINDINGS: Brain: No acute infarct, mass effect or extra-axial collection. No acute or chronic hemorrhage. There is confluent hyperintense T2-weighted signal within the white matter. Generalized volume loss. The midline structures are normal. Vascular: Major flow voids are preserved. Skull and upper cervical spine: Hypertrophic change dorsal to the odontoid process.  Normal bone marrow signal. Sinuses/Orbits:No paranasal sinus fluid levels or advanced mucosal thickening. No mastoid or middle ear effusion. Normal orbits. IMPRESSION: 1. No acute intracranial abnormality. 2. Findings of chronic small vessel ischemia and volume loss. Electronically Signed   By: Ulyses Jarred M.D.   On: 07/19/2022 00:57   CT ANGIO HEAD NECK W WO CM  Addendum Date: 07/18/2022   ADDENDUM REPORT: 07/18/2022 15:05 ADDENDUM: These results were called by telephone at the time of interpretation on 07/18/2022 at 2:50 Pm to provider [facility] , who verbally acknowledged these results. Electronically Signed   By: Emmit Alexanders M.D   On: 07/18/2022 15:05   Result Date: 07/18/2022 CLINICAL DATA:  Stroke/TIA, determine embolic source. Aphasia and headache woke up this morning with difficulty speaking. EXAM: CT ANGIOGRAPHY HEAD AND NECK TECHNIQUE: Multidetector CT imaging of the head and neck was performed using the standard protocol during bolus administration of intravenous contrast. Multiplanar CT image reconstructions and MIPs were obtained to evaluate the vascular anatomy. Carotid stenosis measurements (when applicable) are obtained utilizing NASCET criteria, using the distal internal carotid diameter as the denominator. RADIATION DOSE REDUCTION: This exam was performed according to the departmental dose-optimization program which includes automated exposure control, adjustment of the mA and/or kV according to patient size and/or use of iterative reconstruction technique. CONTRAST:  43m OMNIPAQUE IOHEXOL 350 MG/ML SOLN COMPARISON:  MRI brain 03/02/2022. FINDINGS: CTA NECK FINDINGS Aortic arch: Arch vessel origins are incompletely imaged. Atherosclerotic calcifications of the aortic arch. Right carotid system: Motion artifact through the mid common carotid artery and proximal internal carotid artery. Within this limitation, the mixed plaque at the origin of the right ICA results in less than 50%  stenosis. Left carotid system: Streak artifact from contrast bolus in the left subclavian pain limits evaluation of the proximal left common carotid artery. Motion artifact limits evaluation of the mid CCA and proximal ICA. Within these limitations, there is mixed plaque at the carotid bulb without hemodynamically significant stenosis. The left ICA is patent without stenosis  or aneurysm through the cervical portions. Vertebral arteries: Right dominant vertebral artery. The vertebral arteries are patent through the confluence with the basilar. Skeleton: Diffuse idiopathic skeletal hyperostosis involving the cervical and upper thoracic spine. Disc osteophyte complexes at C4-5 and C6-7 results in at least moderate spinal canal stenosis. No suspicious bone lesions. Other neck: No mucosal lesions along the airway. Glottis is closed. Parotid and submandibular glands are unremarkable. Atrophic thyroid gland. No cervical lymphadenopathy. Upper chest: Scarring at the right lung apex. Review of the MIP images confirms the above findings CTA HEAD FINDINGS Anterior circulation: Atherosclerotic calcifications of the carotid siphons with moderate stenosis of the right cavernous and left clinoid segments of the ICAs. MCAs and ACAs are patent proximally without stenosis or aneurysm. Distal branches are symmetric. Posterior circulation: Atherosclerotic calcifications of the right V4 segment of the vertebral artery. Otherwise normal vertebrobasilar system. The PCAs are patent proximally without stenosis or aneurysm. Distal branches are symmetric. Venous sinuses: Early phase of the contrast bolus. Normal appearance of the superior sagittal sinus to the confluence. Anatomic variants: Posterior communicating arteries are not visualized. Review of the MIP images confirms the above findings IMPRESSION: 1. Motion degraded study. Within this limitation, there is less than 50% stenosis of the proximal right ICA and moderate stenosis of the  right cavernous and left clinoid segments of the ICAs. 2.  Aortic Atherosclerosis (ICD10-I70.0). Electronically Signed: By: Emmit Alexanders M.D On: 07/18/2022 14:45   CT HEAD CODE STROKE WO CONTRAST  Result Date: 07/18/2022 CLINICAL DATA:  Code stroke. Neuro deficit, acute, stroke suspected. Aphasia and headache. EXAM: CT HEAD WITHOUT CONTRAST TECHNIQUE: Contiguous axial images were obtained from the base of the skull through the vertex without intravenous contrast. RADIATION DOSE REDUCTION: This exam was performed according to the departmental dose-optimization program which includes automated exposure control, adjustment of the mA and/or kV according to patient size and/or use of iterative reconstruction technique. COMPARISON:  Head CT 04/03/2013 and MRI 03/02/2022 FINDINGS: Brain: There is no evidence of an acute infarct, intracranial hemorrhage, mass, midline shift, or extra-axial fluid collection. There is mild cerebral atrophy. Confluent hypodensities in the cerebral white matter bilaterally are nonspecific but compatible with extensive chronic small vessel ischemic disease, similar to the prior MRI though progressed from the 2014 CT. Vascular: Calcified atherosclerosis at the skull base. No hyperdense vessel. Skull: No acute fracture or suspicious osseous lesion. Sinuses/Orbits: Visualized paranasal sinuses and mastoid air cells are clear. Bilateral cataract extraction. Other: None. ASPECTS [facility] Stroke Program Early CT Score) - Ganglionic level infarction (caudate, lentiform nuclei, internal capsule, insula, M1-M3 cortex): 7 - Supraganglionic infarction (M4-M6 cortex): 3 Total score (0-10 with 10 being normal): 10 IMPRESSION: 1. No evidence of acute intracranial abnormality. ASPECTS of 10. 2. Extensive chronic small vessel ischemic disease. These results were called by telephone at the time of interpretation on 07/18/2022 at 1:36 pm to Dr. Deno Etienne, who verbally acknowledged these results.  Electronically Signed   By: Logan Bores M.D.   On: 07/18/2022 13:36    Microbiology: Results for orders placed or performed during the hospital encounter of 04/10/20  SARS CORONAVIRUS 2 (TAT 6-24 HRS) Nasopharyngeal Nasopharyngeal Swab     Status: None   Collection Time: 04/10/20  8:55 AM   Specimen: Nasopharyngeal Swab  Result Value Ref Range Status   SARS Coronavirus 2 NEGATIVE NEGATIVE Final    Comment: (NOTE) SARS-CoV-2 target nucleic acids are NOT DETECTED.  The SARS-CoV-2 RNA is generally detectable in upper and lower respiratory specimens  during the acute phase of infection. Negative results do not preclude SARS-CoV-2 infection, do not rule out co-infections with other pathogens, and should not be used as the sole basis for treatment or other patient management decisions. Negative results must be combined with clinical observations, patient history, and epidemiological information. The expected result is Negative.  Fact Sheet for Patients: SugarRoll.be  Fact Sheet for Healthcare Providers: https://www.woods-mathews.com/  This test is not yet approved or cleared by the [place] FDA and  has been authorized for detection and/or diagnosis of SARS-CoV-2 by FDA under an Emergency Use Authorization (EUA). This EUA will remain  in effect (meaning this test can be used) for the duration of the COVID-19 declaration under Se ction 564(b)(1) of the Act, 21 U.S.C. section 360bbb-3(b)(1), unless the authorization is terminated or revoked sooner.  Performed at [facility], [place] [address], [place], [place] [address]     Labs: CBC: Recent Labs  Lab 07/18/22 1300 07/18/22 1734 07/19/22 0509  WBC 6.8 7.0 4.5  NEUTROABS 3.4  --   --   HGB 13.1 13.7 11.9*  HCT 39.4 41.4 35.3*  MCV 101.8* 102.0* 102.6*  PLT 171 183 591   Basic Metabolic Panel: Recent Labs  Lab 07/18/22 1300 07/18/22 1734 07/19/22 0509  NA 138  --  138   K 4.2  --  4.4  CL 102  --  103  CO2 25  --  28  GLUCOSE 106*  --  101*  BUN 16  --  15  CREATININE 1.02 0.96 1.01  CALCIUM 9.7  --  9.1   Liver Function Tests: Recent Labs  Lab 07/18/22 1300  AST 17  ALT 14  ALKPHOS 48  BILITOT 0.8  PROT 7.8  ALBUMIN 4.7   CBG: Recent Labs  Lab 07/18/22 1537  GLUCAP 104*    Discharge time spent: greater than 30 minutes.  Signed: Tawni Millers, MD [facility] 07/19/2022

## 2022-07-19 NOTE — Progress Notes (Signed)
30 day event monitor ordered for TIA, Dr. Sallyanne Kuster to read

## 2022-07-19 NOTE — Plan of Care (Signed)
Problem: Education: Goal: Knowledge of General Education information will improve Description: Including pain rating scale, medication(s)/side effects and non-pharmacologic comfort measures 07/19/2022 0622 by Lorna Dibble, RN Outcome: Progressing 07/19/2022 0622 by Lorna Dibble, RN Outcome: Progressing   Problem: Health Behavior/Discharge Planning: Goal: Ability to manage health-related needs will improve 07/19/2022 0622 by Lorna Dibble, RN Outcome: Progressing 07/19/2022 0622 by Lorna Dibble, RN Outcome: Progressing   Problem: Clinical Measurements: Goal: Ability to maintain clinical measurements within normal limits will improve 07/19/2022 0622 by Lorna Dibble, RN Outcome: Progressing 07/19/2022 0622 by Lorna Dibble, RN Outcome: Progressing Goal: Will remain free from infection 07/19/2022 0622 by Lorna Dibble, RN Outcome: Progressing 07/19/2022 0622 by Lorna Dibble, RN Outcome: Progressing Goal: Diagnostic test results will improve 07/19/2022 0622 by Lorna Dibble, RN Outcome: Progressing 07/19/2022 0622 by Lorna Dibble, RN Outcome: Progressing Goal: Respiratory complications will improve 07/19/2022 0622 by Lorna Dibble, RN Outcome: Progressing 07/19/2022 0622 by Lorna Dibble, RN Outcome: Progressing Goal: Cardiovascular complication will be avoided 07/19/2022 0622 by Lorna Dibble, RN Outcome: Progressing 07/19/2022 0622 by Lorna Dibble, RN Outcome: Progressing   Problem: Activity: Goal: Risk for activity intolerance will decrease 07/19/2022 0622 by Lorna Dibble, RN Outcome: Progressing 07/19/2022 0622 by Lorna Dibble, RN Outcome: Progressing   Problem: Nutrition: Goal: Adequate nutrition will be maintained 07/19/2022 0622 by Lorna Dibble, RN Outcome: Progressing 07/19/2022 0622 by Lorna Dibble, RN Outcome: Progressing   Problem:  Coping: Goal: Level of anxiety will decrease 07/19/2022 0622 by Lorna Dibble, RN Outcome: Progressing 07/19/2022 0622 by Lorna Dibble, RN Outcome: Progressing   Problem: Elimination: Goal: Will not experience complications related to bowel motility 07/19/2022 0622 by Lorna Dibble, RN Outcome: Progressing 07/19/2022 0622 by Lorna Dibble, RN Outcome: Progressing Goal: Will not experience complications related to urinary retention 07/19/2022 0622 by Lorna Dibble, RN Outcome: Progressing 07/19/2022 0622 by Lorna Dibble, RN Outcome: Progressing   Problem: Pain Managment: Goal: General experience of comfort will improve 07/19/2022 0622 by Lorna Dibble, RN Outcome: Progressing 07/19/2022 0622 by Lorna Dibble, RN Outcome: Progressing   Problem: Safety: Goal: Ability to remain free from injury will improve 07/19/2022 0622 by Lorna Dibble, RN Outcome: Progressing 07/19/2022 0622 by Lorna Dibble, RN Outcome: Progressing   Problem: Skin Integrity: Goal: Risk for impaired skin integrity will decrease 07/19/2022 0622 by Lorna Dibble, RN Outcome: Progressing 07/19/2022 0622 by Lorna Dibble, RN Outcome: Progressing   Problem: Education: Goal: Knowledge of disease or condition will improve 07/19/2022 0622 by Lorna Dibble, RN Outcome: Progressing 07/19/2022 0622 by Lorna Dibble, RN Outcome: Progressing Goal: Knowledge of secondary prevention will improve (MUST DOCUMENT ALL) 07/19/2022 0622 by Lorna Dibble, RN Outcome: Progressing 07/19/2022 0622 by Lorna Dibble, RN Outcome: Progressing Goal: Knowledge of patient specific risk factors will improve Elta [place] N/A or DELETE if not current risk factor) 07/19/2022 0622 by Lorna Dibble, RN Outcome: Progressing 07/19/2022 0622 by Lorna Dibble, RN Outcome: Progressing   Problem: Ischemic Stroke/TIA Tissue  Perfusion: Goal: Complications of ischemic stroke/TIA will be minimized 07/19/2022 0622 by Lorna Dibble, RN Outcome: Progressing 07/19/2022 0622 by Lorna Dibble, RN Outcome: Progressing   Problem: Coping: Goal: Will verbalize positive feelings about self 07/19/2022 0622 by Lorna Dibble, RN Outcome: Progressing 07/19/2022 0622 by Lorna Dibble, RN Outcome: Progressing Goal: Will identify appropriate support needs 07/19/2022  0622 by Lorna Dibble, RN Outcome: Progressing 07/19/2022 0622 by Lorna Dibble, RN Outcome: Progressing   Problem: Health Behavior/Discharge Planning: Goal: Ability to manage health-related needs will improve 07/19/2022 0622 by Lorna Dibble, RN Outcome: Progressing 07/19/2022 0622 by Lorna Dibble, RN Outcome: Progressing Goal: Goals will be collaboratively established with patient/family 07/19/2022 0622 by Lorna Dibble, RN Outcome: Progressing 07/19/2022 0622 by Lorna Dibble, RN Outcome: Progressing   Problem: Self-Care: Goal: Ability to participate in self-care as condition permits will improve 07/19/2022 0622 by Lorna Dibble, RN Outcome: Progressing 07/19/2022 0622 by Lorna Dibble, RN Outcome: Progressing Goal: Verbalization of feelings and concerns over difficulty with self-care will improve 07/19/2022 0622 by Lorna Dibble, RN Outcome: Progressing 07/19/2022 0622 by Lorna Dibble, RN Outcome: Progressing Goal: Ability to communicate needs accurately will improve 07/19/2022 0622 by Lorna Dibble, RN Outcome: Progressing 07/19/2022 0622 by Lorna Dibble, RN Outcome: Progressing   Problem: Nutrition: Goal: Risk of aspiration will decrease 07/19/2022 0622 by Lorna Dibble, RN Outcome: Progressing 07/19/2022 0622 by Lorna Dibble, RN Outcome: Progressing Goal: Dietary intake will improve 07/19/2022 0622 by Lorna Dibble,  RN Outcome: Progressing 07/19/2022 0622 by Lorna Dibble, RN Outcome: Progressing

## 2022-07-19 NOTE — Progress Notes (Signed)
PT Cancellation Note  Patient Details Name: MAXIMILLIAN HABIBI MRN: [id number] DOB: Mar 18, 1946   Cancelled Treatment:    Reason Eval/Treat Not Completed: PT screened, no needs identified, will sign off  Patient seen by OT and is at baseline for balance and mobility with no PT needs.    [place]  Office [phone]   Rexanne Mano 07/19/2022, 9:38 AM

## 2022-07-19 NOTE — Progress Notes (Addendum)
STROKE TEAM PROGRESS NOTE   INTERVAL HISTORY Patient is seen in his room with his wife at the bedside.  Yesterday, while he was at the dermatologist office, he experienced sudden onset aphasia.  He knew what he wanted to say, but the words just would not come out.  He developed a headache which was typical of his migraines after this happened.  Symptoms did resolve spontaneously.  Patient has a history of migraines with visual changes but has never had speech difficulty with a migraine in the past.  He states that he has had an increase in his migraines recently when his verapamil was decreased recently by his cardiologist.  Vitals:   07/18/22 2326 07/19/22 0313 07/19/22 0744 07/19/22 1152  BP: 107/64 116/65 115/67   Pulse: 77 82 75   Resp: '16 16 17   '$ Temp: 98 F (36.7 C) 98.1 F (36.7 C) 98.5 F (36.9 C)   TempSrc: Oral Oral Oral   SpO2: 97% 97% 97%   Weight:    78.9 kg  Height:    '5\' 11"'$  (1.803 m)   CBC:  Recent Labs  Lab 07/18/22 1300 07/18/22 1734 07/19/22 0509  WBC 6.8 7.0 4.5  NEUTROABS 3.4  --   --   HGB 13.1 13.7 11.9*  HCT 39.4 41.4 35.3*  MCV 101.8* 102.0* 102.6*  PLT 171 183 169   Basic Metabolic Panel:  Recent Labs  Lab 07/18/22 1300 07/18/22 1734 07/19/22 0509  NA 138  --  138  K 4.2  --  4.4  CL 102  --  103  CO2 25  --  28  GLUCOSE 106*  --  101*  BUN 16  --  15  CREATININE 1.02 0.96 1.01  CALCIUM 9.7  --  9.1   Lipid Panel:  Recent Labs  Lab 07/19/22 0509  CHOL 119  TRIG 75  HDL 51  CHOLHDL 2.3  VLDL 15  LDLCALC 53   HgbA1c: No results for input(s): "HGBA1C" in the last 168 hours. Urine Drug Screen: No results for input(s): "LABOPIA", "COCAINSCRNUR", "LABBENZ", "AMPHETMU", "THCU", "LABBARB" in the last 168 hours.  Alcohol Level  Recent Labs  Lab 07/18/22 1300  ETH <10    IMAGING past 24 hours MR BRAIN WO CONTRAST  Result Date: 07/19/2022 CLINICAL DATA:  Transient ischemic attack EXAM: MRI HEAD WITHOUT CONTRAST TECHNIQUE:  Multiplanar, multiecho pulse sequences of the brain and surrounding structures were obtained without intravenous contrast. COMPARISON:  03/02/2022 FINDINGS: Brain: No acute infarct, mass effect or extra-axial collection. No acute or chronic hemorrhage. There is confluent hyperintense T2-weighted signal within the white matter. Generalized volume loss. The midline structures are normal. Vascular: Major flow voids are preserved. Skull and upper cervical spine: Hypertrophic change dorsal to the odontoid process. Normal bone marrow signal. Sinuses/Orbits:No paranasal sinus fluid levels or advanced mucosal thickening. No mastoid or middle ear effusion. Normal orbits. IMPRESSION: 1. No acute intracranial abnormality. 2. Findings of chronic small vessel ischemia and volume loss. Electronically Signed   By: Ulyses Jarred M.D.   On: 07/19/2022 00:57   CT ANGIO HEAD NECK W WO CM  Addendum Date: 07/18/2022   ADDENDUM REPORT: 07/18/2022 15:05 ADDENDUM: These results were called by telephone at the time of interpretation on 07/18/2022 at 2:50 Pm to provider [facility] , who verbally acknowledged these results. Electronically Signed   By: Emmit Alexanders M.D   On: 07/18/2022 15:05   Result Date: 07/18/2022 CLINICAL DATA:  Stroke/TIA, determine embolic source. Aphasia and headache  woke up this morning with difficulty speaking. EXAM: CT ANGIOGRAPHY HEAD AND NECK TECHNIQUE: Multidetector CT imaging of the head and neck was performed using the standard protocol during bolus administration of intravenous contrast. Multiplanar CT image reconstructions and MIPs were obtained to evaluate the vascular anatomy. Carotid stenosis measurements (when applicable) are obtained utilizing NASCET criteria, using the distal internal carotid diameter as the denominator. RADIATION DOSE REDUCTION: This exam was performed according to the departmental dose-optimization program which includes automated exposure control, adjustment of the mA  and/or kV according to patient size and/or use of iterative reconstruction technique. CONTRAST:  16m OMNIPAQUE IOHEXOL 350 MG/ML SOLN COMPARISON:  MRI brain 03/02/2022. FINDINGS: CTA NECK FINDINGS Aortic arch: Arch vessel origins are incompletely imaged. Atherosclerotic calcifications of the aortic arch. Right carotid system: Motion artifact through the mid common carotid artery and proximal internal carotid artery. Within this limitation, the mixed plaque at the origin of the right ICA results in less than 50% stenosis. Left carotid system: Streak artifact from contrast bolus in the left subclavian pain limits evaluation of the proximal left common carotid artery. Motion artifact limits evaluation of the mid CCA and proximal ICA. Within these limitations, there is mixed plaque at the carotid bulb without hemodynamically significant stenosis. The left ICA is patent without stenosis or aneurysm through the cervical portions. Vertebral arteries: Right dominant vertebral artery. The vertebral arteries are patent through the confluence with the basilar. Skeleton: Diffuse idiopathic skeletal hyperostosis involving the cervical and upper thoracic spine. Disc osteophyte complexes at C4-5 and C6-7 results in at least moderate spinal canal stenosis. No suspicious bone lesions. Other neck: No mucosal lesions along the airway. Glottis is closed. Parotid and submandibular glands are unremarkable. Atrophic thyroid gland. No cervical lymphadenopathy. Upper chest: Scarring at the right lung apex. Review of the MIP images confirms the above findings CTA HEAD FINDINGS Anterior circulation: Atherosclerotic calcifications of the carotid siphons with moderate stenosis of the right cavernous and left clinoid segments of the ICAs. MCAs and ACAs are patent proximally without stenosis or aneurysm. Distal branches are symmetric. Posterior circulation: Atherosclerotic calcifications of the right V4 segment of the vertebral artery. Otherwise  normal vertebrobasilar system. The PCAs are patent proximally without stenosis or aneurysm. Distal branches are symmetric. Venous sinuses: Early phase of the contrast bolus. Normal appearance of the superior sagittal sinus to the confluence. Anatomic variants: Posterior communicating arteries are not visualized. Review of the MIP images confirms the above findings IMPRESSION: 1. Motion degraded study. Within this limitation, there is less than 50% stenosis of the proximal right ICA and moderate stenosis of the right cavernous and left clinoid segments of the ICAs. 2.  Aortic Atherosclerosis (ICD10-I70.0). Electronically Signed: By: WEmmit AlexandersM.D On: 07/18/2022 14:45   CT HEAD CODE STROKE WO CONTRAST  Result Date: 07/18/2022 CLINICAL DATA:  Code stroke. Neuro deficit, acute, stroke suspected. Aphasia and headache. EXAM: CT HEAD WITHOUT CONTRAST TECHNIQUE: Contiguous axial images were obtained from the base of the skull through the vertex without intravenous contrast. RADIATION DOSE REDUCTION: This exam was performed according to the departmental dose-optimization program which includes automated exposure control, adjustment of the mA and/or kV according to patient size and/or use of iterative reconstruction technique. COMPARISON:  Head CT 04/03/2013 and MRI 03/02/2022 FINDINGS: Brain: There is no evidence of an acute infarct, intracranial hemorrhage, mass, midline shift, or extra-axial fluid collection. There is mild cerebral atrophy. Confluent hypodensities in the cerebral white matter bilaterally are nonspecific but compatible with extensive chronic small vessel  ischemic disease, similar to the prior MRI though progressed from the 2014 CT. Vascular: Calcified atherosclerosis at the skull base. No hyperdense vessel. Skull: No acute fracture or suspicious osseous lesion. Sinuses/Orbits: Visualized paranasal sinuses and mastoid air cells are clear. Bilateral cataract extraction. Other: None. ASPECTS [facility]  Stroke Program Early CT Score) - Ganglionic level infarction (caudate, lentiform nuclei, internal capsule, insula, M1-M3 cortex): 7 - Supraganglionic infarction (M4-M6 cortex): 3 Total score (0-10 with 10 being normal): 10 IMPRESSION: 1. No evidence of acute intracranial abnormality. ASPECTS of 10. 2. Extensive chronic small vessel ischemic disease. These results were called by telephone at the time of interpretation on 07/18/2022 at 1:36 pm to Dr. Deno Etienne, who verbally acknowledged these results. Electronically Signed   By: Logan Bores M.D.   On: 07/18/2022 13:36    PHYSICAL EXAM General: Alert, well-nourished, well-developed elderly patient in no acute distress Respiratory: Regular, unlabored respirations on room air  NEURO:  Mental Status: AA&Ox3  Speech/Language: speech is without dysarthria or aphasia.  Naming, repetition, fluency, and comprehension intact.  Cranial Nerves:  II: PERRL. Visual fields full.  III, IV, VI: EOMI. Eyelids elevate symmetrically.  V: Sensation is intact to light touch and symmetrical to face.  VII: Smile is symmetrical.   VIII: hearing intact to voice. IX, X: Phonation is normal.  XI:PJASNKNL shrug 5/5. XII: tongue is midline without fasciculations. Motor: 5/5 strength to all muscle groups tested.  Tone: is normal and bulk is normal Sensation- Intact to light touch bilaterally.  Coordination: FTN intact bilaterally,.No drift.  Gait- deferred   ASSESSMENT/PLAN [name] is a 77 y.o. male with history of hypertension, hyperlipidemia, diabetes, CAD status post CABG and migraines presenting with sudden onset aphasia.  He knew what he wanted to say, but the words just would not come out.  He developed a headache which was typical of his migraines after this happened.  Symptoms did resolve spontaneously.  Patient has a history of migraines with visual changes but has never had speech difficulty with a migraine in the past.  He states that he has had  an increase in his migraines recently when his verapamil was decreased recently.  Likely left hemispheric TIA rather than complex migraine Code Stroke CT head No acute abnormality. Small vessel disease. ASPECTS 10.    CTA head & neck no LVO, less than 50% stenosis of proximal right ICA and moderate stenosis of right cavernous and left clinoid ICAs MRI no acute abnormality 2D Echo EF 55 to 60%, mild LVH, grade 1 diastolic dysfunction, normal left atrial size, normal atrial septum LDL 53 HgbA1c 6.1 VTE prophylaxis -Lovenox    Diet   Diet heart healthy/carb modified Room service appropriate? Yes; Fluid consistency: Thin   aspirin 81 mg daily prior to admission, now on aspirin 81 mg daily and clopidogrel 75 mg daily for 3 weeks followed by Plavix indefinitely. Therapy recommendations: No PT or OT follow-up Disposition: Home  Hypertension Home meds: None Stable Keep systolic blood pressure less than 180 Long-term BP goal normotensive  Hyperlipidemia Home meds: Rosuvastatin 10 mg twice a week, resumed in hospital LDL 53, goal < 70 High intensity statin not indicated as LDL below goal Continue statin at discharge  Diabetes type II Controlled Home meds: Metformin 500 mg twice daily HgbA1c 6.1, goal < 7.0 CBGs Recent Labs    07/18/22 1537  GLUCAP 104*    SSI  Other Stroke Risk Factors Advanced Age >/= 73  Former cigarette smoker Migraines  Other Active Problems None  Hospital day # [place] , MSN, AGACNP-BC [facility] See Amion for schedule and pager information 07/19/2022 12:05 PM  I have personally obtained history,examined this patient, reviewed notes, independently viewed imaging studies, participated in medical decision making and plan of care.ROS completed by me personally and pertinent positives fully documented  I have made any additions or clarifications directly to the above note. Agree with note above.  Patient presented with sudden  onset of expressive language difficulties in the setting of his typical migraine headache with lack of the correct history of visual signs and symptoms that he is having with his previous migraines.  MRI is negative for acute stroke and CT angiogram was unremarkable.  Recommend aspirin Plavix for 3 weeks followed by Plavix alone and aggressive risk factor modifications.  Prolonged cardiac monitoring with 30-day heart monitorv after discharge for for paroxysmal A-fib.  Discussion with patient and family at the bedside and answered questions.   Greater than 50% time during this 50-minute visit was spent in counseling and coordination of care about his TIA and speech difficulties and discussion about evaluation and treatment and prevention and answering questions.  Stroke team will sign off.  Kindly call for questions.  Follow-up with an outpatient stroke clinic with stroke nurse practitioner in 2 months  Antony Contras, [place] Pager: [phone] 07/19/2022 2:31 PM   To contact Stroke Continuity provider, please refer to [email or web address] After hours, contact General Neurology

## 2022-07-20 ENCOUNTER — Other Ambulatory Visit: Payer: Self-pay | Admitting: Cardiovascular Disease

## 2022-07-25 ENCOUNTER — Ambulatory Visit: Payer: Medicare Other | Attending: Physician Assistant

## 2022-07-25 DIAGNOSIS — I4891 Unspecified atrial fibrillation: Secondary | ICD-10-CM

## 2022-07-25 DIAGNOSIS — G459 Transient cerebral ischemic attack, unspecified: Secondary | ICD-10-CM

## 2022-07-26 NOTE — Progress Notes (Unsigned)
Hospital follow up  Assessment and Plan: Hospital visit follow up for:   Joshua Higgins was seen today for hospitalization follow-up.  Diagnoses and all orders for this visit:  TIA (transient ischemic attack) Continue clopidogrel with low dose ASA until 21 days and then only take Clopidogrel Continue to follow with Cardiology, currently wearing 30 day heart monitor -     clopidogrel (PLAVIX) 75 MG tablet; Take 1 tablet (75 mg total) by mouth daily.  Essential hypertension - continue medications, DASH diet, exercise and monitor at home. Call if greater than 130/80.   Coronary artery disease involving coronary bypass graft of native heart with other forms of angina pectoris ([place]) Continue to follow with Cardiology Continue lipids  CKD stage 2 due to type 2 diabetes mellitus (HCC) Increase fluids, avoid NSAIDS, monitor sugars, will monitor -     CBC with Differential/Platelet  Hyperlipidemia associated with type 2 diabetes mellitus (HCC) Continue Crestor 10 mg 2 days a week, diet and exercise  Type 2 diabetes mellitus with stage 2 chronic kidney disease, without long-term current use of insulin (HCC) Continue medications: Continue diet and exercise.  Perform daily foot/skin check, notify office of any concerning changes.   Hypothyroidism, unspecified type Please take your thyroid medication greater than 30 min before breakfast, separated by at least 4 hours  from antacids, calcium, iron, and multivitamins.  Atherosclerosis of abdominal aorta ([place]) by CXR in 2017  Control BP, cholesterol, blood sugars and weight Continue to follow with cardiology  Gastroesophageal reflux disease, unspecified whether esophagitis present Continue use of Famotidine and dietary modifications     All medications were reviewed with patient and family and fully reconciled. All questions answered fully, and patient and family members were encouraged to call the office with any further questions or concerns.  Discussed goal to avoid readmission related to this diagnosis.      Over 40 minutes of exam, counseling, chart review, and complex, high/moderate level critical decision making was performed this visit.   Future Appointments  Date Time Provider [place]  08/30/2022 10:30 AM Almyra Deforest, [place] [facility] None  09/30/2022 10:30 AM Unk Pinto, [place] [facility] None  12/06/2022 10:00 AM Alycia Rossetti, NP [facility] None  03/30/2023 11:00 AM Unk Pinto, MD [facility] None     HPI 77 y.o.male presents for follow up for transition from recent hospitalization or SNIF stay. Admit date to the hospital was 07/18/22, patient was discharged from the hospital on 07/19/22 and our clinical staff contacted the office the day after discharge to set up a follow up appointment. The discharge summary, medications, and diagnostic test results were reviewed before meeting with the patient. The patient was admitted for:  Vitals:   07/27/22 1120  BP: 122/68  Pulse: 85  Temp: 97.7 F (36.5 C)  Height: '5\' 10"'$  (1.778 m)  Weight: 170 lb 12.8 oz (77.5 kg)  SpO2: 96%  BMI (Calculated): 24.51    TIA (transient ischemic attack) Focal neurologic deficit has improved. CT head with no acute changes and CT angio of neck and head with no significant vessel stenosis.   Follow up brain MRI with no acute changes.    Continue blood pressure  control with verapamil. Antiplatelet therapy with aspirin and clopidogrel for 3 weeks then continue with clopidogrel alone.  Statin therapy   Hypertension Patient was placed on verapamil with improvement in his blood pressure. Follow up as outpatient.    CAD s/p CABG No chest pain, continue with statin and antiplatelet therapy,  CKD stage 2 due to type 2 diabetes mellitus ([place]) Renal function stable and electrolytes within normal limits.    Hyperlipidemia associated with type 2 diabetes mellitus ([place]) Continue metformin for glucose control Hgb A1c is 6.1   Continue with statin therapy.  LLD 53 with HDL 51 triglycerides 75    GERD (gastroesophageal reflux disease) Continue antiacid therapy.    Hypothyroidism Continue with levothyroxine    BP currently controlled on Verapamil 240 mg QD. Also uses for control of migraines.  BP Readings from Last 3 Encounters:  07/27/22 122/68  07/19/22 129/68  07/13/22 124/62   BMI is Body mass index is 24.51 kg/m., he has not been working on diet and exercise. Wt Readings from Last 3 Encounters:  07/27/22 170 lb 12.8 oz (77.5 kg)  07/19/22 174 lb (78.9 kg)  07/13/22 174 lb 9.6 oz (79.2 kg)   He is doing a bodyguardian mini plus heart monitor for 30 days through cardiology.   Home health is not involved.   Images while in the hospital: ECHOCARDIOGRAM COMPLETE  Result Date: 07/19/2022    ECHOCARDIOGRAM REPORT   Patient Name:   Joshua Higgins Date of Exam: 07/19/2022 Medical Rec #:  [id number]         Height:       70.0 in Accession #:    [id number]        Weight:       174.6 lb Date of Birth:  26-Oct-1945          BSA:          1.970 m Patient Age:    42 years          BP:           115/67 mmHg Patient Gender: M                 HR:           81 bpm. Exam Location:  Inpatient Procedure: 2D Echo, Cardiac Doppler, Color Doppler and Intracardiac            Opacification Agent Indications:    TIA  History:        Patient has prior history of Echocardiogram examinations, most                 recent 02/19/2019. CAD, Prior CABG, Signs/Symptoms:Chest Pain;                 Risk Factors:Hypertension, Diabetes, Dyslipidemia and Former                 Smoker.  Sonographer:    Clayton Lefort RDCS (AE) Referring Phys: [id number] [place]  Sonographer Comments: Technically difficult study due to poor echo windows and suboptimal subcostal window. IMPRESSIONS  1. Left ventricular ejection fraction, by estimation, is 55 to 60%. The left ventricle has normal function. The left ventricle has no regional wall motion  abnormalities. There is mild concentric left ventricular hypertrophy. Left ventricular diastolic parameters are consistent with Grade I diastolic dysfunction (impaired relaxation).  2. Right ventricular systolic function is normal. The right ventricular size is normal.  3. The mitral valve is degenerative. No evidence of mitral valve regurgitation.  4. The aortic valve is tricuspid. There is mild calcification of the aortic valve. There is mild thickening of the aortic valve. Aortic valve regurgitation is not visualized. Aortic valve sclerosis/calcification is present, without any evidence of aortic stenosis.  5. The inferior vena cava is normal in size  with greater than 50% respiratory variability, suggesting right atrial pressure of 3 mmHg. Comparison(s): No prior Echocardiogram. FINDINGS  Left Ventricle: Left ventricular ejection fraction, by estimation, is 55 to 60%. The left ventricle has normal function. The left ventricle has no regional wall motion abnormalities. Definity contrast agent was given IV to delineate the left ventricular  endocardial borders. The left ventricular internal cavity size was normal in size. There is mild concentric left ventricular hypertrophy. Left ventricular diastolic parameters are consistent with Grade I diastolic dysfunction (impaired relaxation). Right Ventricle: The right ventricular size is normal. No increase in right ventricular wall thickness. Right ventricular systolic function is normal. Left Atrium: Left atrial size was normal in size. Right Atrium: Right atrial size was normal in size. Pericardium: There is no evidence of pericardial effusion. Mitral Valve: The mitral valve is degenerative in appearance. There is mild thickening of the mitral valve leaflet(s). There is mild calcification of the mitral valve leaflet(s). Mild to moderate mitral annular calcification. No evidence of mitral valve regurgitation. MV peak gradient, 6.1 mmHg. The mean mitral valve gradient is  2.0 mmHg. Tricuspid Valve: The tricuspid valve is normal in structure. Tricuspid valve regurgitation is trivial. Aortic Valve: The aortic valve is tricuspid. There is mild calcification of the aortic valve. There is mild thickening of the aortic valve. Aortic valve regurgitation is not visualized. Aortic valve sclerosis/calcification is present, without any evidence of aortic stenosis. Aortic valve mean gradient measures 3.0 mmHg. Aortic valve peak gradient measures 5.5 mmHg. Aortic valve area, by VTI measures 2.21 cm. Pulmonic Valve: The pulmonic valve was normal in structure. Pulmonic valve regurgitation is trivial. Aorta: The aortic root and ascending aorta are structurally normal, with no evidence of dilitation. Venous: The inferior vena cava is normal in size with greater than 50% respiratory variability, suggesting right atrial pressure of 3 mmHg. IAS/Shunts: The atrial septum is grossly normal.  LEFT VENTRICLE PLAX 2D LVIDd:         4.30 cm   Diastology LVIDs:         2.90 cm   LV e' medial:    6.68 cm/s LV PW:         1.10 cm   LV E/e' medial:  11.7 LV IVS:        1.10 cm   LV e' lateral:   9.64 cm/s LVOT diam:     2.10 cm   LV E/e' lateral: 8.1 LV SV:         47 LV SV Index:   24 LVOT Area:     3.46 cm  RIGHT VENTRICLE RV Basal diam:  2.50 cm RV S prime:     12.40 cm/s TAPSE (M-mode): 1.7 cm LEFT ATRIUM           Index        RIGHT ATRIUM           Index LA diam:      2.70 cm 1.37 cm/m   RA Area:     11.70 cm LA Vol (A2C): 16.2 ml 8.22 ml/m   RA Volume:   21.70 ml  11.01 ml/m LA Vol (A4C): 30.2 ml 15.33 ml/m  AORTIC VALVE AV Area (Vmax):    2.35 cm AV Area (Vmean):   2.37 cm AV Area (VTI):     2.21 cm AV Vmax:           117.00 cm/s AV Vmean:          76.000 cm/s AV VTI:  0.213 m AV Peak Grad:      5.5 mmHg AV Mean Grad:      3.0 mmHg LVOT Vmax:         79.50 cm/s LVOT Vmean:        51.900 cm/s LVOT VTI:          0.136 m LVOT/AV VTI ratio: 0.64  AORTA Ao Root diam: 3.10 cm Ao Asc diam:   3.10 cm MITRAL VALVE MV Area (PHT): 5.75 cm     SHUNTS MV Area VTI:   1.98 cm     Systemic VTI:  0.14 m MV Peak grad:  6.1 mmHg     Systemic Diam: 2.10 cm MV Mean grad:  2.0 mmHg MV Vmax:       1.23 m/s MV Vmean:      68.7 cm/s MV Decel Time: 132 msec MV E velocity: 77.90 cm/s MV A velocity: 125.00 cm/s MV E/A ratio:  0.62 Gwyndolyn Kaufman MD Electronically signed by Gwyndolyn Kaufman MD Signature Date/Time: 07/19/2022/12:02:46 PM    Final    MR BRAIN WO CONTRAST  Result Date: 07/19/2022 CLINICAL DATA:  Transient ischemic attack EXAM: MRI HEAD WITHOUT CONTRAST TECHNIQUE: Multiplanar, multiecho pulse sequences of the brain and surrounding structures were obtained without intravenous contrast. COMPARISON:  03/02/2022 FINDINGS: Brain: No acute infarct, mass effect or extra-axial collection. No acute or chronic hemorrhage. There is confluent hyperintense T2-weighted signal within the white matter. Generalized volume loss. The midline structures are normal. Vascular: Major flow voids are preserved. Skull and upper cervical spine: Hypertrophic change dorsal to the odontoid process. Normal bone marrow signal. Sinuses/Orbits:No paranasal sinus fluid levels or advanced mucosal thickening. No mastoid or middle ear effusion. Normal orbits. IMPRESSION: 1. No acute intracranial abnormality. 2. Findings of chronic small vessel ischemia and volume loss. Electronically Signed   By: Ulyses Jarred M.D.   On: 07/19/2022 00:57   CT ANGIO HEAD NECK W WO CM  Addendum Date: 07/18/2022   ADDENDUM REPORT: 07/18/2022 15:05 ADDENDUM: These results were called by telephone at the time of interpretation on 07/18/2022 at 2:50 Pm to provider [facility] , who verbally acknowledged these results. Electronically Signed   By: Emmit Alexanders M.D   On: 07/18/2022 15:05   Result Date: 07/18/2022 CLINICAL DATA:  Stroke/TIA, determine embolic source. Aphasia and headache woke up this morning with difficulty speaking. EXAM: CT ANGIOGRAPHY  HEAD AND NECK TECHNIQUE: Multidetector CT imaging of the head and neck was performed using the standard protocol during bolus administration of intravenous contrast. Multiplanar CT image reconstructions and MIPs were obtained to evaluate the vascular anatomy. Carotid stenosis measurements (when applicable) are obtained utilizing NASCET criteria, using the distal internal carotid diameter as the denominator. RADIATION DOSE REDUCTION: This exam was performed according to the departmental dose-optimization program which includes automated exposure control, adjustment of the mA and/or kV according to patient size and/or use of iterative reconstruction technique. CONTRAST:  40m OMNIPAQUE IOHEXOL 350 MG/ML SOLN COMPARISON:  MRI brain 03/02/2022. FINDINGS: CTA NECK FINDINGS Aortic arch: Arch vessel origins are incompletely imaged. Atherosclerotic calcifications of the aortic arch. Right carotid system: Motion artifact through the mid common carotid artery and proximal internal carotid artery. Within this limitation, the mixed plaque at the origin of the right ICA results in less than 50% stenosis. Left carotid system: Streak artifact from contrast bolus in the left subclavian pain limits evaluation of the proximal left common carotid artery. Motion artifact limits evaluation of the mid CCA and proximal ICA. Within these  limitations, there is mixed plaque at the carotid bulb without hemodynamically significant stenosis. The left ICA is patent without stenosis or aneurysm through the cervical portions. Vertebral arteries: Right dominant vertebral artery. The vertebral arteries are patent through the confluence with the basilar. Skeleton: Diffuse idiopathic skeletal hyperostosis involving the cervical and upper thoracic spine. Disc osteophyte complexes at C4-5 and C6-7 results in at least moderate spinal canal stenosis. No suspicious bone lesions. Other neck: No mucosal lesions along the airway. Glottis is closed. Parotid  and submandibular glands are unremarkable. Atrophic thyroid gland. No cervical lymphadenopathy. Upper chest: Scarring at the right lung apex. Review of the MIP images confirms the above findings CTA HEAD FINDINGS Anterior circulation: Atherosclerotic calcifications of the carotid siphons with moderate stenosis of the right cavernous and left clinoid segments of the ICAs. MCAs and ACAs are patent proximally without stenosis or aneurysm. Distal branches are symmetric. Posterior circulation: Atherosclerotic calcifications of the right V4 segment of the vertebral artery. Otherwise normal vertebrobasilar system. The PCAs are patent proximally without stenosis or aneurysm. Distal branches are symmetric. Venous sinuses: Early phase of the contrast bolus. Normal appearance of the superior sagittal sinus to the confluence. Anatomic variants: Posterior communicating arteries are not visualized. Review of the MIP images confirms the above findings IMPRESSION: 1. Motion degraded study. Within this limitation, there is less than 50% stenosis of the proximal right ICA and moderate stenosis of the right cavernous and left clinoid segments of the ICAs. 2.  Aortic Atherosclerosis (ICD10-I70.0). Electronically Signed: By: Emmit Alexanders M.D On: 07/18/2022 14:45   CT HEAD CODE STROKE WO CONTRAST  Result Date: 07/18/2022 CLINICAL DATA:  Code stroke. Neuro deficit, acute, stroke suspected. Aphasia and headache. EXAM: CT HEAD WITHOUT CONTRAST TECHNIQUE: Contiguous axial images were obtained from the base of the skull through the vertex without intravenous contrast. RADIATION DOSE REDUCTION: This exam was performed according to the departmental dose-optimization program which includes automated exposure control, adjustment of the mA and/or kV according to patient size and/or use of iterative reconstruction technique. COMPARISON:  Head CT 04/03/2013 and MRI 03/02/2022 FINDINGS: Brain: There is no evidence of an acute infarct,  intracranial hemorrhage, mass, midline shift, or extra-axial fluid collection. There is mild cerebral atrophy. Confluent hypodensities in the cerebral white matter bilaterally are nonspecific but compatible with extensive chronic small vessel ischemic disease, similar to the prior MRI though progressed from the 2014 CT. Vascular: Calcified atherosclerosis at the skull base. No hyperdense vessel. Skull: No acute fracture or suspicious osseous lesion. Sinuses/Orbits: Visualized paranasal sinuses and mastoid air cells are clear. Bilateral cataract extraction. Other: None. ASPECTS [facility] Stroke Program Early CT Score) - Ganglionic level infarction (caudate, lentiform nuclei, internal capsule, insula, M1-M3 cortex): 7 - Supraganglionic infarction (M4-M6 cortex): 3 Total score (0-10 with 10 being normal): 10 IMPRESSION: 1. No evidence of acute intracranial abnormality. ASPECTS of 10. 2. Extensive chronic small vessel ischemic disease. These results were called by telephone at the time of interpretation on 07/18/2022 at 1:36 pm to Dr. Deno Etienne, who verbally acknowledged these results. Electronically Signed   By: Logan Bores M.D.   On: 07/18/2022 13:36     Current Outpatient Medications (Endocrine & Metabolic):    levothyroxine (SYNTHROID) 50 MCG tablet, TAKE 1 TABLET BY MOUTH DAILY ON AN EMPTY STOMACH WITH ONLY WATER FOR 30 MINUTES & NO ANTACID MEDS, CALCIUM OR MAGNESIUM FOR 4 HOURS & AVOID BIOTIN (Patient taking differently: Take 50 mcg by mouth daily before breakfast.)   metFORMIN (GLUCOPHAGE-XR) 500 MG  24 hr tablet, TAKE 2 TABLETS 2 TIMES PER DAY WITH MEALS FOR DIABETES (Patient taking differently: Take 1,000 mg by mouth 2 (two) times daily with a meal.)  Current Outpatient Medications (Cardiovascular):    ezetimibe (ZETIA) 10 MG tablet, Take 1 tablet (10 mg total) by mouth daily.   rosuvastatin (CRESTOR) 10 MG tablet, TAKE 1 TABLET 2 TIMES PER WEEK FOR CHOLESTEROL (Patient taking differently: Take 10 mg by  mouth See admin instructions. Take one tablet by mouth twice a week Sundays and Wednesdays)   verapamil (CALAN-SR) 240 MG CR tablet, Take 1 tablet (240 mg total) by mouth daily.  Current Outpatient Medications (Respiratory):    triamcinolone (NASACORT) 55 MCG/ACT AERO nasal inhaler, Place 1 spray into the nose daily as needed. (Patient not taking: Reported on 07/13/2022)  Current Outpatient Medications (Analgesics):    aspirin EC 81 MG tablet, Take 1 tablet (81 mg total) by mouth daily for 21 days.  Current Outpatient Medications (Hematological):    clopidogrel (PLAVIX) 75 MG tablet, Take 1 tablet (75 mg total) by mouth daily.   Cyanocobalamin (B-12) 1000 MCG SUBL, Place 1 tablet under the tongue daily. 2500 mcg  every other day  Current Outpatient Medications (Other):    Blood Glucose Monitoring Suppl DEVI, Use kit to check blood sugar once a day   Cholecalciferol 5000 UNITS TABS, Take 1 tablet by mouth daily.   famotidine (PEPCID) 20 MG tablet, TAKE 1 TABLET BY MOUTH TWICE A DAY (Patient taking differently: Take 20 mg by mouth daily as needed for indigestion.)   glucose blood (CONTOUR NEXT TEST) test strip, USE TO TEST BLOOD SUGAR ONCE DAILY   Lancets MISC, Use to test blood sugar once daily   Multiple Vitamin (MULTIVITAMIN) tablet, Take 1 tablet by mouth at bedtime.    Omega-3 Fatty Acids (FISH OIL) 1000 MG CAPS, Take 1,000 mg by mouth at bedtime.   Past Medical History:  Diagnosis Date   Anemia    low iron   Arthritis    Cancer ([place])    skin cancer on face   Cataracts, bilateral    Coronary artery disease    CABG - 2008   Diabetes mellitus without complication ([place])    type II   GERD (gastroesophageal reflux disease)    Hepatitis    Hyperlipidemia    Hypertension    pt states he does not have HTN, takes Atenolol for migraines   Hypothyroidism    Migraine headache with aura    takes Atenolol   Sleep apnea    does not use cpap     Allergies  Allergen Reactions    Gemfibrozil Other (See Comments)    Muscle aches   Ace Inhibitors Cough   Lipitor [Atorvastatin] Other (See Comments)    Joint pain.    ROS: all negative except above.   Physical Exam: Filed Weights   07/27/22 1120  Weight: 170 lb 12.8 oz (77.5 kg)   BP 122/68   Pulse 85   Temp 97.7 F (36.5 C)   Ht '5\' 10"'$  (1.778 m)   Wt 170 lb 12.8 oz (77.5 kg)   SpO2 96%   BMI 24.51 kg/m  General Appearance: Well nourished, in no apparent distress. Eyes: PERRLA, EOMs, conjunctiva no swelling or erythema Sinuses: No Frontal/maxillary tenderness ENT/Mouth: Ext aud canals clear, TMs without erythema, bulging. No erythema, swelling, or exudate on post pharynx.  Tonsils not swollen or erythematous. Hearing normal.  Neck: Supple, thyroid normal.  Respiratory: Respiratory effort normal,  BS equal bilaterally without rales, rhonchi, wheezing or stridor.  Cardio: RRR with no MRGs. Brisk peripheral pulses without edema.  Abdomen: Soft, + BS.  Non tender, no guarding, rebound, hernias, masses. Lymphatics: Non tender without lymphadenopathy.  Musculoskeletal: Full ROM, 5/5 strength, normal gait.  Skin: Warm, dry without rashes, lesions, ecchymosis.  Neuro: Cranial nerves intact. Normal muscle tone, no cerebellar symptoms. Sensation intact.  Psych: Awake and oriented X 3, normal affect, Insight and Judgment appropriate.     Alycia Rossetti, NP 11:38 AM [facility]

## 2022-07-27 ENCOUNTER — Ambulatory Visit (INDEPENDENT_AMBULATORY_CARE_PROVIDER_SITE_OTHER): Payer: Medicare Other | Admitting: Nurse Practitioner

## 2022-07-27 ENCOUNTER — Encounter: Payer: Self-pay | Admitting: Nurse Practitioner

## 2022-07-27 VITALS — BP 122/68 | HR 85 | Temp 97.7°F | Ht 70.0 in | Wt 170.8 lb

## 2022-07-27 DIAGNOSIS — K219 Gastro-esophageal reflux disease without esophagitis: Secondary | ICD-10-CM

## 2022-07-27 DIAGNOSIS — G459 Transient cerebral ischemic attack, unspecified: Secondary | ICD-10-CM | POA: Diagnosis not present

## 2022-07-27 DIAGNOSIS — I25708 Atherosclerosis of coronary artery bypass graft(s), unspecified, with other forms of angina pectoris: Secondary | ICD-10-CM | POA: Diagnosis not present

## 2022-07-27 DIAGNOSIS — I700 Atherosclerosis of aorta: Secondary | ICD-10-CM | POA: Diagnosis not present

## 2022-07-27 DIAGNOSIS — I10 Essential (primary) hypertension: Secondary | ICD-10-CM

## 2022-07-27 DIAGNOSIS — E039 Hypothyroidism, unspecified: Secondary | ICD-10-CM | POA: Diagnosis not present

## 2022-07-27 DIAGNOSIS — E1122 Type 2 diabetes mellitus with diabetic chronic kidney disease: Secondary | ICD-10-CM | POA: Diagnosis not present

## 2022-07-27 DIAGNOSIS — N182 Chronic kidney disease, stage 2 (mild): Secondary | ICD-10-CM

## 2022-07-27 DIAGNOSIS — E1169 Type 2 diabetes mellitus with other specified complication: Secondary | ICD-10-CM

## 2022-07-27 DIAGNOSIS — E785 Hyperlipidemia, unspecified: Secondary | ICD-10-CM

## 2022-07-27 LAB — CBC WITH DIFFERENTIAL/PLATELET
Absolute Monocytes: 521 cells/uL (ref 200–950)
Basophils Absolute: 31 cells/uL (ref 0–200)
Basophils Relative: 0.5 %
Eosinophils Absolute: 130 cells/uL (ref 15–500)
Eosinophils Relative: 2.1 %
HCT: 38.2 % — ABNORMAL LOW (ref 38.5–50.0)
Hemoglobin: 13 g/dL — ABNORMAL LOW (ref 13.2–17.1)
Lymphs Abs: 2437 cells/uL (ref 850–3900)
MCH: 34.3 pg — ABNORMAL HIGH (ref 27.0–33.0)
MCHC: 34 g/dL (ref 32.0–36.0)
MCV: 100.8 fL — ABNORMAL HIGH (ref 80.0–100.0)
MPV: 9.6 fL (ref 7.5–12.5)
Monocytes Relative: 8.4 %
Neutro Abs: 3081 cells/uL (ref 1500–7800)
Neutrophils Relative %: 49.7 %
Platelets: 187 10*3/uL (ref 140–400)
RBC: 3.79 10*6/uL — ABNORMAL LOW (ref 4.20–5.80)
RDW: 11.7 % (ref 11.0–15.0)
Total Lymphocyte: 39.3 %
WBC: 6.2 10*3/uL (ref 3.8–10.8)

## 2022-07-27 MED ORDER — CLOPIDOGREL BISULFATE 75 MG PO TABS: 75.0000 mg | ORAL_TABLET | Freq: Every day | ORAL | 1 refills | Status: DC

## 2022-07-27 NOTE — Patient Instructions (Signed)
Follow up with Cardiology Almyra Deforest PA 08/30/22   Transient Ischemic Attack A transient ischemic attack (TIA) causes the same symptoms as a stroke, but the symptoms go away quickly. A TIA happens when blood flow to the brain is blocked. Having a TIA means you may be at risk for a stroke. A TIA is a medical emergency. What are the causes? A TIA is caused by a blocked artery in the head or neck. This means the brain does not get the blood supply it needs. A blockage can be caused by: Fatty buildup in an artery in the head or neck. A blood clot. A tear in an artery. Irritation and swelling (inflammation) of an artery. Sometimes the cause is not known. What increases the risk? Certain things may make you more likely to have a TIA. Some of these are things that you can change, such as: Using products that have nicotine or tobacco. Not being active. Drinking too much alcohol. Using recreational drugs. Health conditions that may increase your risk include: High blood pressure. High cholesterol. Diabetes. Heart disease. A heartbeat that is not regular (atrial fibrillation). Sickle cell disease. Problems with blood clotting. Other risk factors include: Being over the age of 22. Being male. Being very overweight. Sleep problems (sleep apnea). Having a family history of stroke. Having had blood clots, stroke, TIA, or heart attack in the past. What are the signs or symptoms? The symptoms of a TIA are like those of a stroke. They can include: Weakness or loss of feeling in your face, arm, or leg. This often happens on one side of your body. Trouble walking. Trouble moving your arms or legs. Trouble talking or understanding what people are saying. Problems with how you see. Feeling dizzy. Feeling confused. Loss of balance or coordination. Feeling like you may vomit (nausea) or vomiting. Having a very bad headache. If you can, note what time you started to have symptoms. Tell your  doctor. How is this treated? The goal of treatment is to lower the risk for a stroke. This may include: Changes to diet and lifestyle, such as getting regular exercise and stopping smoking. Taking medicines to: Thin the blood. Lower blood pressure. Lower cholesterol. Treating other health conditions, such as diabetes. If testing shows that an artery in your brain is narrow, your doctor may recommend a procedure to: Take the blockage out of your artery. Open or widen an artery in your neck (carotid angioplasty and stenting). Follow these instructions at home: Medicines Take over-the-counter and prescription medicines only as told by your doctor. If you were told to take aspirin or another medicine to thin your blood, use it exactly as told by your doctor. Taking too much of the medicine can cause bleeding. Taking too little of the medicine may not work to treat the problem. Eating and drinking  Eat 5 or more servings of fruits and vegetables each day. Follow instructions from your doctor about your diet. You may need to follow a certain diet to help lower your risk of a stroke. You may need to: Eat a diet that is low in fat and salt. Eat foods with a lot of fiber. Limit carbohydrates and sugar. If you drink alcohol: Limit how much you have to: 0-1 drink a day for women who are not pregnant. 0-2 drinks a day for men. Know how much alcohol is in a drink. In the [place] one drink equals one 12 oz bottle of beer (355 mL), one 5 oz glass of  wine (148 mL), or one 1 oz glass of hard liquor (44 mL). General instructions Keep a healthy weight. Try to get at least 30 minutes of exercise on most days. Get treatment if you have sleep problems. Do not smoke or use any products that contain nicotine or tobacco. If you need help quitting, ask your doctor. Do not use drugs. Keep all follow-up visits. Your doctor will want to know if you have any more symptoms and to check blood labs if any  medicines were prescribed. Where to find more information American Stroke Association: stroke.org Get help right away if: You have chest pain. You have a heartbeat that is not regular. You have any signs of a stroke. "BE FAST" is an easy way to remember the main warning signs: B - Balance. Dizziness, sudden trouble walking, or loss of balance. E - Eyes. Trouble seeing or a change in how you see. F - Face. Sudden weakness or loss of feeling of the face. The face or eyelid may droop on one side. A - Arms. Weakness or loss of feeling in an arm. This happens all of a sudden and most often on one side of the body. S - Speech. Sudden trouble speaking, slurred speech, or trouble understanding what people say. T - Time. Time to call emergency services. Write down what time symptoms started. You have other signs of a stroke, such as: A sudden, very bad headache with no known cause. Feeling like you may vomit. Vomiting. A seizure. These symptoms may be an emergency. Get help right away. Call 911. Do not wait to see if the symptoms will go away. Do not drive yourself to the hospital. This information is not intended to replace advice given to you by your health care provider. Make sure you discuss any questions you have with your health care provider. Document Revised: 12/03/2021 Document Reviewed: 12/03/2021 Elsevier Patient Education  [place].

## 2022-07-28 ENCOUNTER — Encounter: Payer: Self-pay | Admitting: Internal Medicine

## 2022-08-16 ENCOUNTER — Telehealth: Payer: Self-pay | Admitting: Nurse Practitioner

## 2022-08-16 ENCOUNTER — Other Ambulatory Visit: Payer: Self-pay

## 2022-08-16 MED ORDER — LEVOTHYROXINE SODIUM 50 MCG PO TABS: ORAL_TABLET | ORAL | 3 refills | Status: DC

## 2022-08-16 NOTE — Telephone Encounter (Signed)
Pt is requesting a refill on Levothyroxine to go to [facility] on file

## 2022-08-27 NOTE — Progress Notes (Unsigned)
Cardiology Office Note:    Date:  08/30/2022   ID:  Joshua Higgins, DOB [birth year], MRN [id number]  PCP:  Unk Pinto, [place] Providers Cardiologist:  Sanda Klein, MD {  Referring MD: Unk Pinto, MD   Chief Complaint  Patient presents with   Follow-up    Post TIA    History of Present Illness:    Joshua Higgins is a 77 y.o. male with a hx of CAD s/p CABG, HTN, history of TIA, former tobacco use. Patient is followed by Dr. Sallyanne Kuster and presents today for follow up after TIA   Per chart review, patient had a CABG approximately 14 years ago with LIMA-LAD, SVG-ramus intermedius and OM, SVG-marginal branch of RCA.  At that time, patient had been diagnosed with CAD based on abnormal screening stress test. He had not complained of convincing angina pectoris. Because of his lack of symptoms prior to CABG, Dr. Sallyanne Kuster has been monitoring him with exercise tolerance tests.  Most recent exercise tolerance test was completed in 04/2020 showed no ST segment deviation during stress.  There were no diagnostic ST changes, there were 3 beats of nonsustained VT.  He was later seen in the ED on 01/23/2022 complaining of atypical chest pain.  It was suspected that this was GERD.  He had a nuclear medicine PET scan on 04/19/2022 that was a low risk study.  There was evidence of a small amount of ischemia without evidence of infarction.  Patient was last seen by cardiology on 07/13/22. At that time, patient was doing well. Dr. Sallyanne Kuster recommended yearly treadmill ECG stress tests every other year for monitoring. Patient was admitted from 07/18/22 - 07/19/22 for treatment of TIA.  On the morning of 1/15, patient noticed difficulty speaking.  He went to the ED for evaluation.  CT head showed no acute intracranial abnormality with extensive chronic small vessel ischemic disease.  He was evaluated by teleneurology in the ED who recommended antiplatelet therapy.  Brain MRI showed no  acute findings.  He underwent echocardiogram on 07/19/2022 that showed EF 55-60%, mild LVH, grade 1 diastolic dysfunction, normal RV systolic function.  Patient was discharged on dual antiplatelet therapy with aspirin and Plavix for 3 weeks.  Plan to continue with Plavix alone after that.  A cardiac monitor was ordered, result is still pending.   Today, patient reports that he has been doing well since getting out of the hospital.  He wore his cardiac monitor and mailed it back in on 2/22.  While wearing the monitor, patient did experience a few episodes of palpitations.  Palpitations only lasted for couple seconds at a time and occurred once every couple of days.  Denies having issues with palpitations prior to his TIA.  When the palpitations occurred, he did feel like he had to breathe a bit harder.  However he denies shortness of breath otherwise.  Denies chest pain, syncope, near syncope.  He has chronic dizziness.  He remains on verapamil for migraine management, reports feeling somewhat fatigued on the medication but his migraines are better controlled.  In the past, Dr. Sallyanne Kuster had attempted to do decrease his verapamil dose to prevent fatigue, however patient did have migraines on the lower dose.  Denies ankle edema, but does fear that he is starting to develop diabetic neuropathy in his feet.   Past Medical History:  Diagnosis Date   Anemia    low iron   Arthritis    Cancer ([place])  skin cancer on face   Cataracts, bilateral    Coronary artery disease    CABG - 2008   Diabetes mellitus without complication ([place])    type II   GERD (gastroesophageal reflux disease)    Hepatitis    Hyperlipidemia    Hypertension    pt states he does not have HTN, takes Atenolol for migraines   Hypothyroidism    Migraine headache with aura    takes Atenolol   Sleep apnea    does not use cpap    Past Surgical History:  Procedure Laterality Date   BACK SURGERY     CARDIAC SURGERY     Bypass    CATARACT EXTRACTION, BILATERAL Bilateral 2018   Dr. Gershon Crane   COLONOSCOPY     CORONARY ANGIOPLASTY  2001   CORONARY ARTERY BYPASS GRAFT     ELBOW SURGERY Left    due to damaged nerve   KNEE SURGERY Right    arthroscopic   LUMBAR LAMINECTOMY/DECOMPRESSION MICRODISCECTOMY Right 08/04/2014   Procedure: Right Lumbar four-five Microdiskectomy;  Surgeon: Kristeen Miss, MD;  Location: [facility];  Service: Neurosurgery;  Laterality: Right;  Right L4-5 Microdiskectomy   SURGERY SCROTAL / TESTICULAR     as a child/teenager    Current Medications: Current Meds  Medication Sig   Blood Glucose Monitoring Suppl DEVI Use kit to check blood sugar once a day   Cholecalciferol 5000 UNITS TABS Take 1 tablet by mouth daily.   clopidogrel (PLAVIX) 75 MG tablet Take 1 tablet (75 mg total) by mouth daily.   Cyanocobalamin (B-12) 1000 MCG SUBL Place 1 tablet under the tongue daily. 2500 mcg  every other day   ezetimibe (ZETIA) 10 MG tablet Take 1 tablet (10 mg total) by mouth daily.   famotidine (PEPCID) 20 MG tablet TAKE 1 TABLET BY MOUTH TWICE A DAY (Patient taking differently: Take 20 mg by mouth daily as needed for indigestion.)   glucose blood (CONTOUR NEXT TEST) test strip USE TO TEST BLOOD SUGAR ONCE DAILY   Lancets MISC Use to test blood sugar once daily   levothyroxine (SYNTHROID) 50 MCG tablet TAKE 1 TABLET BY MOUTH DAILY ON AN EMPTY STOMACH WITH ONLY WATER FOR 30 MINUTES & NO ANTACID MEDS, CALCIUM OR MAGNESIUM FOR 4 HOURS & AVOID BIOTIN   metFORMIN (GLUCOPHAGE-XR) 500 MG 24 hr tablet TAKE 2 TABLETS 2 TIMES PER DAY WITH MEALS FOR DIABETES (Patient taking differently: Take 1,000 mg by mouth 2 (two) times daily with a meal.)   Multiple Vitamin (MULTIVITAMIN) tablet Take 1 tablet by mouth at bedtime.    Omega-3 Fatty Acids (FISH OIL) 1000 MG CAPS Take 1,000 mg by mouth at bedtime.    rosuvastatin (CRESTOR) 10 MG tablet TAKE 1 TABLET 2 TIMES PER WEEK FOR CHOLESTEROL (Patient taking differently: Take 10  mg by mouth See admin instructions. Take one tablet by mouth twice a week Sundays and Wednesdays)   verapamil (CALAN-SR) 240 MG CR tablet Take 1 tablet (240 mg total) by mouth daily.     Allergies:   Gemfibrozil, Ace inhibitors, and Lipitor [atorvastatin]   Social History   Socioeconomic History   Marital status: Married    Spouse name: Not on file   Number of children: Not on file   Years of education: Not on file   Highest education level: Not on file  Occupational History   Not on file  Tobacco Use   Smoking status: Former    Types: Cigarettes  Quit date: 10/01/1973    Years since quitting: 48.9   Smokeless tobacco: Never  Substance and Sexual Activity   Alcohol use: No   Drug use: No   Sexual activity: Not on file  Other Topics Concern   Not on file  Social History Narrative   Not on file   Social Determinants of Health   Financial Resource Strain: Not on file  Food Insecurity: No Food Insecurity (07/19/2022)   Hunger Vital Sign    Worried About Running Out of Food in the Last Year: Never true    Ran Out of Food in the Last Year: Never true  Transportation Needs: No Transportation Needs (07/19/2022)   PRAPARE - Hydrologist (Medical): No    Lack of Transportation (Non-Medical): No  Physical Activity: Not on file  Stress: Not on file  Social Connections: Not on file     Family History: The patient's family history includes Arthritis in his mother; CAD in his brother and sister; Diabetes in his maternal grandmother; Heart attack in his brother and father; Hypertension in his brother; Ulcers in his mother.  ROS:   Please see the history of present illness.     All other systems reviewed and are negative.  EKGs/Labs/Other Studies Reviewed:    The following studies were reviewed today:  NM PET Cardiac Perfusion 04/19/22 IMPRESSION: 1. No acute noncardiac findings in the chest. 2. Patulous esophagus. 3.  Aortic Atherosclerosis  (ICD10-I70.0).  Echocardiogram 07/19/22  1. Left ventricular ejection fraction, by estimation, is 55 to 60%. The  left ventricle has normal function. The left ventricle has no regional  wall motion abnormalities. There is mild concentric left ventricular  hypertrophy. Left ventricular diastolic  parameters are consistent with Grade I diastolic dysfunction (impaired  relaxation).   2. Right ventricular systolic function is normal. The right ventricular  size is normal.   3. The mitral valve is degenerative. No evidence of mitral valve  regurgitation.   4. The aortic valve is tricuspid. There is mild calcification of the  aortic valve. There is mild thickening of the aortic valve. Aortic valve  regurgitation is not visualized. Aortic valve sclerosis/calcification is  present, without any evidence of  aortic stenosis.   5. The inferior vena cava is normal in size with greater than 50%  respiratory variability, suggesting right atrial pressure of 3 mmHg.   Comparison(s): No prior Echocardiogram.    Recent Labs: 03/25/2022: Magnesium 1.8 07/12/2022: TSH 1.79 07/18/2022: ALT 14 07/19/2022: BUN 15; Creatinine, Ser 1.01; Potassium 4.4; Sodium 138 07/27/2022: Hemoglobin 13.0; Platelets 187  Recent Lipid Panel    Component Value Date/Time   CHOL 119 07/19/2022 0509   TRIG 75 07/19/2022 0509   HDL 51 07/19/2022 0509   CHOLHDL 2.3 07/19/2022 0509   VLDL 15 07/19/2022 0509   LDLCALC 53 07/19/2022 0509   LDLCALC 52 07/12/2022 0000     Risk Assessment/Calculations:                Physical Exam:    VS:  BP (!) 124/56 (BP Location: Left Arm, Patient Position: Sitting, Cuff Size: Normal)   Pulse 74   Ht 5' 10.5" (1.791 m)   Wt 170 lb 12.8 oz (77.5 kg)   SpO2 98%   BMI 24.16 kg/m     Wt Readings from Last 3 Encounters:  08/30/22 170 lb 12.8 oz (77.5 kg)  07/27/22 170 lb 12.8 oz (77.5 kg)  07/19/22 174 lb (78.9 kg)  GEN:  Well nourished, well developed in no acute distress.  Sitting comfortably on the exam table  HEENT: Normal NECK: No carotid bruits bilaterally  CARDIAC: RRR, no murmurs, rubs, gallops RESPIRATORY:  Clear to auscultation without rales, wheezing or rhonchi. Normal WOB on room air  ABDOMEN: Soft, non-tender, non-distended MUSCULOSKELETAL:  No edema in BLE; No deformity  SKIN: Warm and dry NEUROLOGIC:  Alert and oriented x 3 PSYCHIATRIC:  Normal affect   ASSESSMENT:    1. History of TIA (transient ischemic attack)   2. Palpitations   3. Coronary artery disease involving coronary bypass graft of native heart without angina pectoris   4. Hypercholesterolemia   5. Essential hypertension    PLAN:    In order of problems listed above:  Recent TIA  - Patient was admitted to the hospital from 07/18/22- 07/19/22 for treatment of TIA. Echocardiogram showed EF 55-60%, no regional wall motion abnormalities, mild LVH, grade 1 diastolic dysfunction.  CTA head and neck showed less than 50% stenosis in proximal right ICA. - Cardiac event monitor pending (patient mailed back in on 2/22) - He was discharged on DAPT with ASA and plavix for 3 weeks with plans to continue plavix monotherapy after  - Continue plavix. Denies bleeding issues on plavix   Palpitations - Patient reports that since his TIA, he has noted occasional episodes of palpitations.  Episodes are brief and only last a couple of seconds at a time.  Occur once every couple of days - Patient did have palpitations when wearing his cardiac event monitor, monitor was recently mailed back on 2/22 and results are pending - TSH was 1.79 on 07/12/2022 - Further recs pending monitor results  CAD s/p CABG  - Patient had a CABG approximately 14 years ago with LIMA-LAD, SVG-ramus intermedius and OM, SVG-marginal branch of RCA. - Cardiac PET from 04/2022 showed was a low risk study with a small area of basal inferior ischemia  - Of note, patient did have have significant angina prior to CABG, and was  diagnosed with a screening stress test  - Dr. Sallyanne Kuster recommends treadmill ECG stress tests every other year- will be due 04/2024  - Patient denies having any chest pain, shortness of breath on exertion. - Continue plavix, zetia, crestor 10 mg twice weekly  - Patient was previously on atenolol (for migraines) but is now on verapamil   [name]  - Lipid panel from 07/2022 showed LDL 53, HDL 51, triglycerides 75, total cholesterol 119 - Patient can only tolerate crestor 10 mg twice weekly   - Continue zetia    HTN  Migraines  - BP and migraines are well controlled on verapamil 240 mg daily  - Note, patient does have some fatigue on verapamil. In the past, we have attempted to decrease the dose of verapamil. However, patient had recurrent migraines on the lower dose   Medication Adjustments/Labs and Tests Ordered: Current medicines are reviewed at length with the patient today.  Concerns regarding medicines are outlined above.  No orders of the defined types were placed in this encounter.  No orders of the defined types were placed in this encounter.   Patient Instructions  Medication Instructions:   Your physician recommends that you continue on your current medications as directed. Please refer to the Current Medication list given to you today.  *If you need a refill on your cardiac medications before your next appointment, please call your pharmacy*  Lab Work: NONE ordered at this time of appointment  If you have labs (blood work) drawn today and your tests are completely normal, you will receive your results only by: [employer] (if you have MyChart) OR A paper copy in the mail If you have any lab test that is abnormal or we need to change your treatment, we will call you to review the results.  Testing/Procedures: NONE ordered at this time of appointment   Follow-Up: At [facility], you and your health needs are our priority.  As part of our continuing mission to  provide you with exceptional heart care, we have created designated Provider Care Teams.  These Care Teams include your primary Cardiologist (physician) and Advanced Practice Providers (APPs -  Physician Assistants and Nurse Practitioners) who all work together to provide you with the care you need, when you need it.  Your next appointment:   6 month(s)  Provider:   Sanda Klein, MD     Other Instructions     Signed, Margie Billet, PA-C  08/30/2022 1:07 PM    [place]

## 2022-08-30 ENCOUNTER — Encounter: Payer: Self-pay | Admitting: Physician Assistant

## 2022-08-30 ENCOUNTER — Ambulatory Visit: Payer: Medicare Other | Attending: Physician Assistant | Admitting: Cardiology

## 2022-08-30 VITALS — BP 124/56 | HR 74 | Ht 70.5 in | Wt 170.8 lb

## 2022-08-30 DIAGNOSIS — E7800 Pure hypercholesterolemia, unspecified: Secondary | ICD-10-CM | POA: Diagnosis not present

## 2022-08-30 DIAGNOSIS — R002 Palpitations: Secondary | ICD-10-CM

## 2022-08-30 DIAGNOSIS — I25810 Atherosclerosis of coronary artery bypass graft(s) without angina pectoris: Secondary | ICD-10-CM | POA: Diagnosis not present

## 2022-08-30 DIAGNOSIS — I10 Essential (primary) hypertension: Secondary | ICD-10-CM

## 2022-08-30 DIAGNOSIS — Z8673 Personal history of transient ischemic attack (TIA), and cerebral infarction without residual deficits: Secondary | ICD-10-CM | POA: Diagnosis not present

## 2022-08-30 NOTE — Patient Instructions (Addendum)
Medication Instructions:   Your physician recommends that you continue on your current medications as directed. Please refer to the Current Medication list given to you today.  *If you need a refill on your cardiac medications before your next appointment, please call your pharmacy*  Lab Work: NONE ordered at this time of appointment   If you have labs (blood work) drawn today and your tests are completely normal, you will receive your results only by: [place] (if you have MyChart) OR A paper copy in the mail If you have any lab test that is abnormal or we need to change your treatment, we will call you to review the results.  Testing/Procedures: NONE ordered at this time of appointment   Follow-Up: At [facility], you and your health needs are our priority.  As part of our continuing mission to provide you with exceptional heart care, we have created designated Provider Care Teams.  These Care Teams include your primary Cardiologist (physician) and Advanced Practice Providers (APPs -  Physician Assistants and Nurse Practitioners) who all work together to provide you with the care you need, when you need it.  Your next appointment:   6 month(s)  Provider:   Sanda Klein, MD     Other Instructions

## 2022-09-30 ENCOUNTER — Ambulatory Visit: Payer: Medicare Other | Admitting: Internal Medicine

## 2022-10-06 ENCOUNTER — Encounter: Payer: Self-pay | Admitting: Internal Medicine

## 2022-10-06 NOTE — Progress Notes (Signed)
Future Appointments  Date Time Provider Department  10/07/2022                     6 mo  10:30 AM [name], MD [facility]  12/06/2022                    wellness 10:00 AM Raynelle Dick, NP [facility]  03/08/2023 10:00 AM Croitoru, Rachelle Hora, MD [facility]  03/30/2023                   cpe 11:00 AM [name], MD [facility]    History of Present Illness:       This very nice 77 y.o.  MWM presents for 6 month follow up with HTN, ASCAD /CABG, [name], [name] w/CKD2  and Vitamin D Deficiency.  In Aug 2020, patient was noted by Chest CTA scan to have Aortic Atherosclerosis.         Patient is treated for HTN  (1989) & BP has been controlled at home. Today's BP was at goal - 136/70 .   Patient has had no complaints of any cardiac type chest pain, palpitations, dyspnea Pollyann Kennedy /PND, dizziness, claudication or dependent edema.        Hyperlipidemia is controlled with diet & Rosuvastatin . Patient denies myalgias or other med SE's. Last Lipids were at goal:  Lab Results  Component Value Date   CHOL 119 07/19/2022   HDL 51 07/19/2022   LDLCALC 53 07/19/2022   TRIG 75 07/19/2022   CHOLHDL 2.3 07/19/2022     Also, the patient has history of   T2_NIDDMw/ CKD2 ( 2000)  and has had no symptoms of reactive hypoglycemia, diabetic polys, paresthesias or visual blurring.  Last A1c was not at goal:  Lab Results  Component Value Date   HGBA1C 6.0 (H) 03/27/2020            [name]                                                     Further, the patient also has history of Vitamin D Deficiency ("33" /2008) and supplements vitamin D without any suspected side-effects. Last vitamin D was at goal:  Lab Results  Component Value Date   VD25OH 68 12/24/2019    Current Outpatient Medications  Medication Instructions   Cholecalciferol 5000 u 1 tablet Daily   clopidogrel 75 mg Daily   B-12 1000 MCG SL  2500 mcg   1 tablet  every other day   ezetimibe (ZETIA)  10 mg Daily   famotidine (PEPCID) 20 mg 2 times daily   Fish Oil1,000 mg Daily at bedtime   levothyroxine 50 MCG tablet TAKE 1 TABLET DAILY   metFORMIN -XR 500 MG  TAKE 2 TABLETS 2 TIMES / DAY WITH MEALS   Multiple Vitamin  1 tablet   at bedtime   Rosuvastatin  10 MG tablet TAKE 1 TABLET 2 TIMES/ WEEK    NASACORT nasal inhaler 1 spray Daily PRN   verapamil SR 240 mg Daily     Allergies  Allergen Reactions   Gemfibrozil Other (See Comments)    Muscle aches   Ace Inhibitors Cough   Lipitor [Atorvastatin] Other (See Comments)  Joint pain.    PMHx:   Past Medical History:  Diagnosis Date   Anemia    low iron   Arthritis    Cancer (HCC)    skin cancer on face   Cataracts, bilateral    Coronary artery disease    CABG - 2008   Diabetes mellitus without complication (HCC)    type II   GERD (gastroesophageal reflux disease)    Hepatitis    Hyperlipidemia    Hypertension    pt states he does not have HTN, takes Atenolol for migraines   Hypothyroidism    Migraine headache with aura    takes Atenolol   Sleep apnea  - does not use cpap        Immunization History  Administered Date(s) Administered   DT (Pediatric) 08/29/2003, 06/04/2015   Influenza Split 02/21/2019   Influenza, High Dose Seasonal PF 04/03/2018, 02/21/2019, 03/05/2020   Influenza-Unspecified 04/16/2014, 03/30/2016, 04/02/2017   Moderna Sars-Covid-2 Vaccination 07/16/2019, 08/16/2019, 04/27/2020   Pneumococcal Conjugate-13 12/26/2013   Pneumococcal Polysaccharide-23 01/29/2010   Zoster 05/11/2010   Zoster Recombinat (Shingrix) 01/31/2017, 07/05/2017, 08/09/2017    Past Surgical History:  Procedure Laterality Date   BACK SURGERY     CARDIAC SURGERY     Bypass   CATARACT EXTRACTION, BILATERAL Bilateral 2018   Dr. Nile Riggs   COLONOSCOPY     CORONARY ANGIOPLASTY  2001   CORONARY ARTERY BYPASS GRAFT     ELBOW SURGERY Left    due to damaged nerve    KNEE SURGERY Right    arthroscopic   LUMBAR LAMINECTOMY/DECOMPRESSION MICRODISCECTOMY Right 08/04/2014   Procedure: Right Lumbar four-five Microdiskectomy;  Surgeon: Barnett Abu, MD;  Location: [facility];  Service: Neurosurgery;  Laterality: Right;  Right L4-5 Microdiskectomy   SURGERY SCROTAL / TESTICULAR     as a child/teenager    FHx:    Reviewed / unchanged  SHx:    Reviewed / unchanged   Systems Review:  Constitutional: Denies fever, chills, wt changes, headaches, insomnia, fatigue, night sweats, change in appetite. Eyes: Denies redness, blurred vision, diplopia, discharge, itchy, watery eyes.  ENT: Denies discharge, congestion, post nasal drip, epistaxis, sore throat, earache, hearing loss, dental pain, tinnitus, vertigo, sinus pain, snoring.  CV: Denies chest pain, palpitations, irregular heartbeat, syncope, dyspnea, diaphoresis, orthopnea, PND, claudication or edema. Respiratory: denies cough, dyspnea, DOE, pleurisy, hoarseness, laryngitis, wheezing.  Gastrointestinal: Denies dysphagia, odynophagia, heartburn, reflux, water brash, abdominal pain or cramps, nausea, vomiting, bloating, diarrhea, constipation, hematemesis, melena, hematochezia  or hemorrhoids. Genitourinary: Denies dysuria, frequency, urgency, nocturia, hesitancy, discharge, hematuria or flank pain. Musculoskeletal: Denies arthralgias, myalgias, stiffness, jt. swelling, pain, limping or strain/sprain.  Skin: Denies pruritus, rash, hives, warts, acne, eczema or change in skin lesion(s). Neuro: No weakness, tremor, incoordination, spasms, paresthesia or pain. Psychiatric: Denies confusion, memory loss or sensory loss. Endo: Denies change in weight, skin or hair change.  Heme/Lymph: No excessive bleeding, bruising or enlarged lymph nodes.  Physical Exam  BP 136/70   Pulse 87   Temp 97.6 F (36.4 C)   Resp 16   Ht 5' 10.5" (1.791 m)   Wt 171 lb 3.2 oz (77.7 kg)   SpO2 97%   BMI 24.22 kg/m   Appears  well  nourished, well groomed  and in no distress.  Eyes: PERRLA, EOMs, conjunctiva no swelling or erythema. Sinuses: No frontal/maxillary tenderness ENT/Mouth: EAC's clear, TM's nl w/o erythema, bulging. Nares clear w/o erythema, swelling, exudates. Oropharynx clear without erythema or exudates. Oral  hygiene is good. Tongue normal, non obstructing. Hearing intact.  Neck: Supple. Thyroid not palpable. Car 2+/2+ without bruits, nodes or JVD. Chest: Respirations nl with BS clear & equal w/o rales, rhonchi, wheezing or stridor.  Cor: Heart sounds normal w/ regular rate and rhythm without sig. murmurs, gallops, clicks or rubs. Peripheral pulses normal and equal  without edema.  Abdomen: Soft & bowel sounds normal. Non-tender w/o guarding, rebound, hernias, masses or organomegaly.  Lymphatics: Unremarkable.  Musculoskeletal: Full ROM all peripheral extremities, joint stability, 5/5 strength and normal gait.  Skin: Warm, dry without exposed rashes, lesions or ecchymosis apparent.  Neuro: Cranial nerves intact, reflexes equal bilaterally. Sensory-motor testing grossly intact. Tendon reflexes grossly intact.  Pysch: Alert & oriented x 3.  Insight and judgement nl & appropriate. No ideations.  Assessment and Plan:  1. Essential hypertension  - Continue medication, monitor blood pressure at home.  - Continue DASH diet.  Reminder to go to the ER if any CP,  SOB, nausea, dizziness, severe HA, changes vision/speech.  - CBC with Differential/Platelet - COMPLETE METABOLIC PANEL WITH GFR - Magnesium - TSH  2. Hyperlipidemia associated with type 2 diabetes mellitus (HCC)  - Continue diet/meds, exercise,& lifestyle modifications.  - Continue monitor periodic cholesterol/liver & renal functions   - Lipid panel - TSH  3. Type 2 diabetes mellitus with stage 2 chronic kidney  disease, without long-term current use of insulin (HCC)  - Continue diet, exercise  - Lifestyle modifications.  - Monitor  appropriate labs  - Hemoglobin A1c - Insulin, random  4. Coronary artery disease involving coronary bypass  graft of native heart without angina pectoris  - Lipid panel  5. Vitamin D deficiency  - Continue supplementation.  - VITAMIN D 25 Hydroxy  6. Hypothyroidism,  - TSH  7. Medication management  - CBC with Differential/Platelet - COMPLETE METABOLIC PANEL WITH GFR - Magnesium - Lipid panel - TSH - Hemoglobin A1c - Insulin, random - VITAMIN D 25 Hydroxy         Discussed  regular exercise, BP monitoring, weight control to achieve/maintain BMI less than 25 and discussed med and SE's. Recommended labs to assess and monitor clinical status with further disposition pending results of labs.  I discussed the assessment and treatment plan with the patient. The patient was provided an opportunity to ask questions and all were answered. The patient agreed with the plan and demonstrated an understanding of the instructions.  I provided over 30 minutes of exam, counseling, chart review and  complex critical decision making.         The patient was advised to call back or seek an in-person evaluation if the symptoms worsen or if the condition fails to improve as anticipated.   Marinus Maw, MD

## 2022-10-06 NOTE — Patient Instructions (Signed)

## 2022-10-07 ENCOUNTER — Encounter: Payer: Self-pay | Admitting: Internal Medicine

## 2022-10-07 ENCOUNTER — Ambulatory Visit (INDEPENDENT_AMBULATORY_CARE_PROVIDER_SITE_OTHER): Payer: Medicare Other | Admitting: Internal Medicine

## 2022-10-07 VITALS — BP 136/70 | HR 87 | Temp 97.6°F | Resp 16 | Ht 70.5 in | Wt 171.2 lb

## 2022-10-07 DIAGNOSIS — I700 Atherosclerosis of aorta: Secondary | ICD-10-CM | POA: Diagnosis not present

## 2022-10-07 DIAGNOSIS — E559 Vitamin D deficiency, unspecified: Secondary | ICD-10-CM | POA: Diagnosis not present

## 2022-10-07 DIAGNOSIS — Z79899 Other long term (current) drug therapy: Secondary | ICD-10-CM | POA: Diagnosis not present

## 2022-10-07 DIAGNOSIS — I10 Essential (primary) hypertension: Secondary | ICD-10-CM

## 2022-10-07 DIAGNOSIS — E1122 Type 2 diabetes mellitus with diabetic chronic kidney disease: Secondary | ICD-10-CM | POA: Diagnosis not present

## 2022-10-07 DIAGNOSIS — N182 Chronic kidney disease, stage 2 (mild): Secondary | ICD-10-CM | POA: Diagnosis not present

## 2022-10-07 DIAGNOSIS — E039 Hypothyroidism, unspecified: Secondary | ICD-10-CM | POA: Diagnosis not present

## 2022-10-07 DIAGNOSIS — E1169 Type 2 diabetes mellitus with other specified complication: Secondary | ICD-10-CM | POA: Diagnosis not present

## 2022-10-07 DIAGNOSIS — E785 Hyperlipidemia, unspecified: Secondary | ICD-10-CM

## 2022-10-07 DIAGNOSIS — I25708 Atherosclerosis of coronary artery bypass graft(s), unspecified, with other forms of angina pectoris: Secondary | ICD-10-CM

## 2022-10-08 ENCOUNTER — Encounter: Payer: Self-pay | Admitting: Internal Medicine

## 2022-10-08 ENCOUNTER — Other Ambulatory Visit: Payer: Self-pay | Admitting: Internal Medicine

## 2022-10-08 NOTE — Progress Notes (Signed)
<><><><><><><><><><><><><><><><><><><><><><><><><><><><><><><><><> <><><><><><><><><><><><><><><><><><><><><><><><><><><><><><><><><> -   Test results slightly outside the reference range are not unusual. If there is anything important, I will review this with you,  otherwise it is considered normal test values.  If you have further questions,  please do not hesitate to contact me at the office or via My Chart.  <><><><><><><><><><><><><><><><><><><><><><><><><><><><><><><><><> <><><><><><><><><><><><><><><><><><><><><><><><><><><><><><><><><>  -  CBC - Hgb ( Red Cell count) remains Normal <><><><><><><><><><><><><><><><><><><><><><><><><><><><><><><><><>  -  Glucose = 154 mg% is elevated  & A1c = 6.1 % is elevated                                                                            ( Normal is less than 5.7%)   - So work harder on diet    - Avoid Sweets, Candy & White Stuff   - White Rice, White Potatoes, White Flour  - Breads &  Pasta <><><><><><><><><><><><><><><><><><><><><><><><><><><><><><><><><> <><><><><><><><><><><><><><><><><><><><><><><><><><><><><><><><><>  -  Total Col = 117    &  LDL = 40   - Both  Excellent   - Very low risk for Heart Attack  / Stroke <><><><><><><><><><><><><><><><><><><><><><><><><><><><><><><><><> <><><><><><><><><><><><><><><><><><><><><><><><><><><><><><><><><>  -  Vitamin D = 58  - OK , but a klittle low   - Vitamin D goal is between 70-100.   - Please make sure that you are taking your Vitamin D as directed.   - It is very important as a natural anti-inflammatory and helping the  immune system protect against viral infections, like the Covid-19    helping hair, skin, and nails, as well as reducing stroke and  heart attack risk.   - It helps your bones and helps with mood.  - It also decreases numerous cancer risks so please  take it as directed.   - Low Vit D is associated with a 200-300% higher risk for  CANCER   and  200-300% higher risk for HEART   ATTACK  &  STROKE.    - It is also associated with higher death rate at younger ages,   autoimmune diseases like Rheumatoid arthritis, Lupus, Multiple Sclerosis.     - Also many other serious conditions, like depression, Alzheimer's Dementia,                                                                             muscle aches, fatigue, fibromyalgia  <><><><><><><><><><><><><><><><><><><><><><><><><><><><><><><><><> <><><><><><><><><><><><><><><><><><><><><><><><><><><><><><><><><>  -  All Else - CBC - Kidneys - Electrolytes - Liver - Magnesium & Thyroid    - all  Normal / OK  <><><><><><><><><><><><><><><><><><><><><><><><><><><><><><><><><> <><><><><><><><><><><><><><><><><><><><><><><><><><><><><><><><><>

## 2022-10-10 LAB — COMPLETE METABOLIC PANEL WITH GFR
AG Ratio: 1.6 (calc) (ref 1.0–2.5)
ALT: 14 U/L (ref 9–46)
AST: 17 U/L (ref 10–35)
Albumin: 4.6 g/dL (ref 3.6–5.1)
Alkaline phosphatase (APISO): 56 U/L (ref 35–144)
BUN: 13 mg/dL (ref 7–25)
CO2: 28 mmol/L (ref 20–32)
Calcium: 9.7 mg/dL (ref 8.6–10.3)
Chloride: 105 mmol/L (ref 98–110)
Creat: 0.93 mg/dL (ref 0.70–1.28)
Globulin: 2.8 g/dL (calc) (ref 1.9–3.7)
Glucose, Bld: 154 mg/dL — ABNORMAL HIGH (ref 65–99)
Potassium: 4.6 mmol/L (ref 3.5–5.3)
Sodium: 145 mmol/L (ref 135–146)
Total Bilirubin: 0.6 mg/dL (ref 0.2–1.2)
Total Protein: 7.4 g/dL (ref 6.1–8.1)
eGFR: 84 mL/min/{1.73_m2} (ref >=60)

## 2022-10-10 LAB — LIPID PANEL
Cholesterol: 117 mg/dL (ref <200)
HDL: 60 mg/dL (ref >=40)
LDL Cholesterol (Calc): 40 mg/dL (calc)
Non-HDL Cholesterol (Calc): 57 mg/dL (calc) (ref <130)
Total CHOL/HDL Ratio: 2 (calc) (ref <5.0)
Triglycerides: 90 mg/dL (ref <150)

## 2022-10-10 LAB — CBC WITH DIFFERENTIAL/PLATELET
Absolute Monocytes: 504 cells/uL (ref 200–950)
Basophils Absolute: 31 cells/uL (ref 0–200)
Basophils Relative: 0.6 %
Eosinophils Absolute: 99 cells/uL (ref 15–500)
Eosinophils Relative: 1.9 %
HCT: 39.6 % (ref 38.5–50.0)
Hemoglobin: 13 g/dL — ABNORMAL LOW (ref 13.2–17.1)
Lymphs Abs: 1820 cells/uL (ref 850–3900)
MCH: 33.6 pg — ABNORMAL HIGH (ref 27.0–33.0)
MCHC: 32.8 g/dL (ref 32.0–36.0)
MCV: 102.3 fL — ABNORMAL HIGH (ref 80.0–100.0)
MPV: 9.8 fL (ref 7.5–12.5)
Monocytes Relative: 9.7 %
Neutro Abs: 2746 cells/uL (ref 1500–7800)
Neutrophils Relative %: 52.8 %
Platelets: 172 10*3/uL (ref 140–400)
RBC: 3.87 10*6/uL — ABNORMAL LOW (ref 4.20–5.80)
RDW: 11.8 % (ref 11.0–15.0)
Total Lymphocyte: 35 %
WBC: 5.2 10*3/uL (ref 3.8–10.8)

## 2022-10-10 LAB — MAGNESIUM: Magnesium: 1.9 mg/dL (ref 1.5–2.5)

## 2022-10-10 LAB — HEMOGLOBIN A1C
Hgb A1c MFr Bld: 6.1 % of total Hgb — ABNORMAL HIGH (ref <5.7)
Mean Plasma Glucose: 128 mg/dL
eAG (mmol/L): 7.1 mmol/L

## 2022-10-10 LAB — INSULIN, RANDOM: Insulin: 62.7 u[IU]/mL — ABNORMAL HIGH

## 2022-10-10 LAB — TSH: TSH: 1.96 mIU/L (ref 0.40–4.50)

## 2022-10-10 LAB — VITAMIN D 25 HYDROXY (VIT D DEFICIENCY, FRACTURES): Vit D, 25-Hydroxy: 58 ng/mL (ref 30–100)

## 2022-10-20 ENCOUNTER — Other Ambulatory Visit: Payer: Self-pay | Admitting: Nurse Practitioner

## 2022-11-08 DIAGNOSIS — H40003 Preglaucoma, unspecified, bilateral: Secondary | ICD-10-CM | POA: Diagnosis not present

## 2022-12-06 ENCOUNTER — Ambulatory Visit: Payer: Medicare Other | Admitting: Nurse Practitioner

## 2022-12-14 DIAGNOSIS — Z08 Encounter for follow-up examination after completed treatment for malignant neoplasm: Secondary | ICD-10-CM | POA: Diagnosis not present

## 2022-12-14 DIAGNOSIS — L570 Actinic keratosis: Secondary | ICD-10-CM | POA: Diagnosis not present

## 2022-12-14 DIAGNOSIS — L821 Other seborrheic keratosis: Secondary | ICD-10-CM | POA: Diagnosis not present

## 2022-12-14 DIAGNOSIS — Z85828 Personal history of other malignant neoplasm of skin: Secondary | ICD-10-CM | POA: Diagnosis not present

## 2022-12-26 ENCOUNTER — Ambulatory Visit: Payer: Medicare Other | Admitting: Nurse Practitioner

## 2023-01-17 NOTE — Progress Notes (Unsigned)
MEDICARE ANNUAL WELLNESS VISIT AND FOLLOW UP Assessment:   Diagnoses and all orders for this visit:  Encounter for Medicare annual wellness exam Due annually   Essential hypertension Well controlled; continue medication Monitor blood pressure at home; call if consistently over 130/80 Continue DASH diet.   Reminder to go to the ER if any CP, SOB, nausea, dizziness, severe HA, changes vision/speech, left arm numbness and tingling and jaw pain.  Coronary artery disease involving coronary bypass graft of native heart, Other angina (HCC) very infrequent CCS class II exertional angina pectoris; continue BB; followed by cardiology (Dr. Royann Shivers) Continue ASA, close management of cholesterol   Atherosclerosis of abdominal aorta (HCC) Per CXR 2017 Control blood pressure, cholesterol, glucose, increase exercise.   Gastroesophageal reflux disease, esophagitis presence not specified Well managed with PRN famotidine Discussed diet, avoiding triggers and other lifestyle changes  Irritable bowel syndrome, unspecified type Avoid trigger foods; currently stable with lifestyle modification only  Type 2 diabetes mellitus with stage 2 chronic kidney disease, without long-term current use of insulin (HCC) Continue metformin Continue diet and exercise.  Perform daily foot/skin check, notify office of any concerning changes.  -     Hemoglobin A1c  CKD II associated with T2DM (HCC)  Increase fluids, avoid NSAIDS, monitor sugars, will monitor  Hypothyroidism, unspecified type Continue synthroid reminded to take on an empty stomach 30-36mins before food.  -     TSH  Mixed hyperlipidemia associated with T2DM (HCC) Continue medications; currently at LDL goal <70 Continue low cholesterol diet and exercise.  Check lipid panel.  -     Lipid panel  Vitamin D deficiency Continue supplementation At goal at last visit; defer checking level  Medication management -     CBC with  Differential/Platelet -     CMP/GFR  Former smoker Remote; quit in 1975  Macrocytic/hyperchronic Check B12, folate levels  Over 30 minutes of exam, counseling, chart review, and critical decision making was performed  Future Appointments  Date Time Provider Department Center  01/18/2023 10:30 AM Raynelle Dick, NP [facility] None  03/08/2023 10:00 AM Croitoru, Rachelle Hora, MD [facility] None  05/08/2023 11:00 AM [name], MD [facility] None     Plan:   During the course of the visit the patient was educated and counseled about appropriate screening and preventive services including:   Pneumococcal vaccine  Influenza vaccine Prevnar 13 Td vaccine Screening electrocardiogram Colorectal cancer screening Diabetes screening Glaucoma screening Nutrition counseling    Subjective:  Joshua Higgins is a 77 y.o. male who presents for Medicare Annual Wellness Visit and 3 month follow up for HTN, hyperlipidemia, diabetes with CKD, hypothyroid and vitamin D Def.   Had MOH's surgery under R eye and has done well since, has had several BCC/SCC follows with skin surgery center.   He continues to have dizziness and has been present for 50-60 years. Has used medication in the past and did not relieve the dizziness and has also been to ENT and cold not find any abnormalities.  Patient has ASCAD with stenting in 2001, CABG 2008 with very infrequent CCS class II exertional angina pectoris and followed by Dr. Royann Shivers. Recent admission for chest pain in 02/2019 resulted in unremarkable cardiac workup and attributed to GERD.   he has a diagnosis of GERD which is currently managed by famotidine 20 mg BID PRN (few times per month), well controlled.   BMI is There is no height or weight on file to calculate BMI., he has been working  on diet and exercise, will walk with dog, has been very active around house and yard, cutting down trees. Watching portions.  Wt Readings from Last 3 Encounters:   10/07/22 171 lb 3.2 oz (77.7 kg)  08/30/22 170 lb 12.8 oz (77.5 kg)  07/27/22 170 lb 12.8 oz (77.5 kg)   He has aortic atherosclerosis per CXR 2017.  His blood pressure has been controlled at home, today their BP is    BP Readings from Last 3 Encounters:  10/07/22 136/70  08/30/22 (!) 124/56  07/27/22 122/68  He does workout. He denies chest pain, shortness of breath, dizziness.    He is on cholesterol medication (rosuvastatin 10 mg twice weekly) and denies myalgias (he stopped taking gemfibrozil due to muscle aches, does not tolerate high dose statins). His cholesterol is at goal. The cholesterol last visit was:   Lab Results  Component Value Date   CHOL 117 10/07/2022   HDL 60 10/07/2022   LDLCALC 40 10/07/2022   TRIG 90 10/07/2022   CHOLHDL 2.0 10/07/2022   He has been working on diet and exercise for T2 diabetes (treated by metformin 1000 mg BID), he is on ASA, Statin, ARB and denies increased appetite, nausea, polydipsia, polyuria, visual disturbances, vomiting and weight loss. He does check fasting glucose in the AM, typically 105-125. Last A1C in the office was:  Lab Results  Component Value Date   HGBA1C 6.1 (H) 10/07/2022   He has CKD II associated with T2DM monitored at this office:  Lab Results  Component Value Date   GFRNONAA >60 07/19/2022   He is on thyroid medication. His medication was not changed last visit. Takes 50 mcg daily, takes with other meds in the morning.  Lab Results  Component Value Date   TSH 1.96 10/07/2022    Patient is on Vitamin D supplement and at goal at last check:   Lab Results  Component Value Date   VD25OH 58 10/07/2022     He has had persistent macrocytic hyperchromic cells without significant anemia since 2018, He is taking SL B12 2500 daily Lab Results  Component Value Date   VITAMINB12 1,913 (H) 03/25/2022       Medication Review: Current Outpatient Medications on File Prior to Visit  Medication Sig Dispense Refill    Blood Glucose Monitoring Suppl DEVI Use kit to check blood sugar once a day 1 each 0   Cholecalciferol 5000 UNITS TABS Take 1 tablet by mouth daily.     clopidogrel (PLAVIX) 75 MG tablet Take 1 tablet (75 mg total) by mouth daily. 90 tablet 1   Cyanocobalamin (B-12) 1000 MCG SUBL Place 1 tablet under the tongue daily. 2500 mcg  every other day     ezetimibe (ZETIA) 10 MG tablet Take 1 tablet (10 mg total) by mouth daily. 90 tablet 3   famotidine (PEPCID) 20 MG tablet TAKE 1 TABLET BY MOUTH TWICE A DAY (Patient taking differently: Take 20 mg by mouth daily as needed for indigestion.) 180 tablet 1   glucose blood (CONTOUR NEXT TEST) test strip USE TO TEST BLOOD SUGAR ONCE DAILY 100 strip 12   Lancets MISC Use to test blood sugar once daily 200 each 3   levothyroxine (SYNTHROID) 50 MCG tablet TAKE 1 TABLET BY MOUTH DAILY ON AN EMPTY STOMACH WITH ONLY WATER FOR 30 MINUTES & NO ANTACID MEDS, CALCIUM OR MAGNESIUM FOR 4 HOURS & AVOID BIOTIN 90 tablet 3   metFORMIN (GLUCOPHAGE-XR) 500 MG 24 hr tablet TAKE 2  TABLETS BY MOUTH 2 TIMES PER DAY WITH MEALS FOR DIABETES 360 tablet 1   Multiple Vitamin (MULTIVITAMIN) tablet Take 1 tablet by mouth at bedtime.      Omega-3 Fatty Acids (FISH OIL) 1000 MG CAPS Take 1,000 mg by mouth at bedtime.      rosuvastatin (CRESTOR) 10 MG tablet TAKE 1 TABLET 2 TIMES PER WEEK FOR CHOLESTEROL (Patient taking differently: Take 10 mg by mouth See admin instructions. Take one tablet by mouth twice a week Sundays and Wednesdays) 26 tablet 3   verapamil (CALAN-SR) 240 MG CR tablet Take 1 tablet (240 mg total) by mouth daily. 30 tablet 0   No current facility-administered medications on file prior to visit.    Allergies: Allergies  Allergen Reactions   Gemfibrozil Other (See Comments)    Muscle aches   Ace Inhibitors Cough   Lipitor [Atorvastatin] Other (See Comments)    Joint pain.    Current Problems (verified) has [name] w/Stage 2 CKD (HCC); Hyperlipidemia associated  with type 2 diabetes mellitus (HCC); Hypertension; GERD (gastroesophageal reflux disease); IBS (irritable bowel syndrome); Vitamin D deficiency; Medication management; CAD s/p CABG; Hypothyroidism; Atherosclerosis of abdominal aorta (HCC) by CXR in 2017 ; CKD stage 2 due to type 2 diabetes mellitus (HCC); BMI 24.0-24.9, adult; Former smoker; History of Mohs micrographic surgery for skin cancer; Macrocytic anemia; B12 deficiency; COVID-19 (01/29/2021); and TIA (transient ischemic attack) on their problem list.  Screening Tests Immunization History  Administered Date(s) Administered   DT (Pediatric) 08/29/2003, 06/04/2015   Influenza Split 02/21/2019   Influenza, High Dose Seasonal PF 04/03/2018, 02/21/2019, 03/05/2020, 03/29/2022   Influenza-Unspecified 04/16/2014, 03/30/2016, 04/02/2017   Moderna SARS-COV2 Booster Vaccination 10/21/2020, 10/22/2020   Moderna Sars-Covid-2 Vaccination 07/16/2019, 08/16/2019, 04/27/2020   Pfizer Covid-19 Vaccine Bivalent Booster 91yrs & up 05/13/2021   Pneumococcal Conjugate-13 12/26/2013   Pneumococcal Polysaccharide-23 01/29/2010   Zoster Recombinant(Shingrix) 01/31/2017, 07/05/2017, 08/09/2017   Zoster, Live 05/11/2010    Preventative care: Last colonoscopy: 2005, 2018 following positive cologuard which was negative, DONE per GI Cologuard: 2018  CXR 02/2019  Prior vaccinations: TD or Tdap: 2016  Influenza: 03/2020 Pneumococcal: 2011 Prevnar13: 2015 Shingles/Zostavax: 2011, 2019 2/2 Covid 19: 2/2, 2021, 2 boosters  Names of Other Physician/Practitioners you currently use: 1. [facility] here for primary care 2. Dr. Nile Riggs, eye doctor, last visit  - diabetic eye exam 05/10/21 - report received and abstracted 3. Dr. Hulan Fray, dentist, last visit  2023, typically goes q66m  Patient Care Team: [name], MD as PCP - General (Internal Medicine) Thurmon Fair, MD as PCP - Cardiology (Cardiology) Jethro Bolus, MD as Consulting Physician (Ophthalmology) Barnett Abu, MD as Consulting Physician (Neurosurgery) Dorena Cookey, MD (Inactive) as Consulting Physician (Gastroenterology)  Surgical: He  has a past surgical history that includes Cardiac surgery; Knee surgery (Right); Back surgery; Coronary angioplasty (2001); Coronary artery bypass graft; Surgery scrotal / testicular; Elbow surgery (Left); Colonoscopy; Lumbar laminectomy/decompression microdiscectomy (Right, 08/04/2014); and Cataract extraction, bilateral (Bilateral, 2018). Family His family history includes Arthritis in his mother; CAD in his brother and sister; Diabetes in his maternal grandmother; Heart attack in his brother and father; Hypertension in his brother; Ulcers in his mother. Social history  He reports that he quit smoking about 49 years ago. His smoking use included cigarettes. He has never used smokeless tobacco. He reports that he does not drink alcohol and does not use drugs.  MEDICARE WELLNESS OBJECTIVES: Physical activity:   Cardiac risk factors:   Depression/mood  screen:      10/06/2022    9:51 PM  Depression screen PHQ 2/9  Decreased Interest 0  Down, Depressed, Hopeless 0  PHQ - 2 Score 0    ADLs:     10/06/2022    9:51 PM 07/19/2022   11:00 AM  In your present state of health, do you have any difficulty performing the following activities:  Hearing? 0 0  Vision? 0 0  Difficulty concentrating or making decisions? 0 0  Walking or climbing stairs? 0 0  Dressing or bathing? 0 0  Doing errands, shopping? 0 1     Cognitive Testing  Alert? Yes  Normal Appearance?Yes  Oriented to person? Yes  Place? Yes   Time? Yes  Recall of three objects?  Yes  Can perform simple calculations? Yes  Displays appropriate judgment?Yes  Can read the correct time from a watch face?Yes  EOL planning:     Objective:   There were no vitals filed for this visit.   There is no height or weight on file to calculate  BMI.  General appearance: alert, no distress, WD/WN, male HEENT: normocephalic, sclerae anicteric, TMs pearly, nares patent, no discharge or erythema, pharynx normal Oral cavity: MMM, no lesions Neck: supple, no lymphadenopathy, no thyromegaly, no masses Heart: RRR, normal S1, S2, no murmurs Lungs: CTA bilaterally, no wheezes, rhonchi, or rales Abdomen: +bs, soft, non tender, non distended, no masses, no hepatomegaly, no splenomegaly Musculoskeletal: nontender, no swelling, no obvious deformity Extremities: no edema, no cyanosis, no clubbing Pulses: 2+ symmetric, upper 1+ diminished bilateral lower extremities, normal cap refill Neurological: alert, oriented x 3, CN2-12 intact, strength normal upper extremities and lower extremities, sensation normal throughout, DTRs 2+ throughout, no cerebellar signs, gait normal Psychiatric: normal affect, behavior normal, pleasant   Medicare Attestation I have personally reviewed: The patient's medical and social history Their use of alcohol, tobacco or illicit drugs Their current medications and supplements The patient's functional ability including ADLs,fall risks, home safety risks, cognitive, and hearing and visual impairment Diet and physical activities Evidence for depression or mood disorders  The patient's weight, height, BMI, and visual acuity have been recorded in the chart.  I have made referrals, counseling, and provided education to the patient based on review of the above and I have provided the patient with a written personalized care plan for preventive services.     Raynelle Dick, NP   01/17/2023

## 2023-01-18 ENCOUNTER — Encounter: Payer: Self-pay | Admitting: Nurse Practitioner

## 2023-01-18 ENCOUNTER — Ambulatory Visit (INDEPENDENT_AMBULATORY_CARE_PROVIDER_SITE_OTHER): Payer: Medicare Other | Admitting: Nurse Practitioner

## 2023-01-18 VITALS — BP 130/62 | HR 79 | Temp 97.3°F | Ht 70.0 in | Wt 177.2 lb

## 2023-01-18 DIAGNOSIS — K589 Irritable bowel syndrome without diarrhea: Secondary | ICD-10-CM | POA: Diagnosis not present

## 2023-01-18 DIAGNOSIS — I25708 Atherosclerosis of coronary artery bypass graft(s), unspecified, with other forms of angina pectoris: Secondary | ICD-10-CM

## 2023-01-18 DIAGNOSIS — E559 Vitamin D deficiency, unspecified: Secondary | ICD-10-CM

## 2023-01-18 DIAGNOSIS — Z87891 Personal history of nicotine dependence: Secondary | ICD-10-CM

## 2023-01-18 DIAGNOSIS — K219 Gastro-esophageal reflux disease without esophagitis: Secondary | ICD-10-CM | POA: Diagnosis not present

## 2023-01-18 DIAGNOSIS — Z0001 Encounter for general adult medical examination with abnormal findings: Secondary | ICD-10-CM

## 2023-01-18 DIAGNOSIS — I700 Atherosclerosis of aorta: Secondary | ICD-10-CM

## 2023-01-18 DIAGNOSIS — E1169 Type 2 diabetes mellitus with other specified complication: Secondary | ICD-10-CM

## 2023-01-18 DIAGNOSIS — I10 Essential (primary) hypertension: Secondary | ICD-10-CM | POA: Diagnosis not present

## 2023-01-18 DIAGNOSIS — G43101 Migraine with aura, not intractable, with status migrainosus: Secondary | ICD-10-CM | POA: Diagnosis not present

## 2023-01-18 DIAGNOSIS — N182 Chronic kidney disease, stage 2 (mild): Secondary | ICD-10-CM

## 2023-01-18 DIAGNOSIS — Z79899 Other long term (current) drug therapy: Secondary | ICD-10-CM | POA: Diagnosis not present

## 2023-01-18 DIAGNOSIS — D539 Nutritional anemia, unspecified: Secondary | ICD-10-CM

## 2023-01-18 DIAGNOSIS — E785 Hyperlipidemia, unspecified: Secondary | ICD-10-CM

## 2023-01-18 DIAGNOSIS — E039 Hypothyroidism, unspecified: Secondary | ICD-10-CM

## 2023-01-18 DIAGNOSIS — E1122 Type 2 diabetes mellitus with diabetic chronic kidney disease: Secondary | ICD-10-CM

## 2023-01-18 DIAGNOSIS — R6889 Other general symptoms and signs: Secondary | ICD-10-CM

## 2023-01-18 DIAGNOSIS — Z0000 Encounter for general adult medical examination without abnormal findings: Secondary | ICD-10-CM

## 2023-01-18 MED ORDER — ATENOLOL 50 MG PO TABS: 50.0000 mg | ORAL_TABLET | Freq: Every day | ORAL | Status: DC

## 2023-01-18 NOTE — Patient Instructions (Signed)
Stop Verapamil and start atenolol 50 mg every day Follow up in 2 weeks to recheck BP and headaches   Atenolol Tablets What is this medication? ATENOLOL (a TEN oh lole) treats high blood pressure. It also prevents chest pain (angina) or further damage after a heart attack. It works by lowering your blood pressure and heart rate, making it easier for your heart to pump blood to the rest of your body. It belongs to a group of medications called beta blockers. This medicine may be used for other purposes; ask your health care provider or pharmacist if you have questions. COMMON BRAND NAME(S): Tenormin What should I tell my care team before I take this medication? They need to know if you have any of these conditions: Diabetes Having surgery Heart or blood vessel conditions, such as slow heartbeat, heart failure, heart block Kidney disease Lung or breathing disease, such as asthma or COPD Myasthenia gravis Pheochromocytoma Thyroid disease An unusual or allergic reaction to atenolol, other medications, foods, dyes, or preservatives Pregnant or trying to get pregnant Breastfeeding How should I use this medication? Take this medication by mouth. Take it as directed on the prescription label at the same time every day. You can take it with or without food. If it upsets your stomach, take it with food. Keep taking it unless your care team tells you to stop. Talk to your care team about the use of this medication in children. Special care may be needed. Overdosage: If you think you have taken too much of this medicine contact a poison control center or emergency room at once. NOTE: This medicine is only for you. Do not share this medicine with others. What if I miss a dose? If you miss a dose, take it as soon as you can. If it is almost time for your next dose, take only that dose. Do not take double or extra doses. What may interact with this medication? This medication may interact with the  following: Certain medications for blood pressure, heart disease, irregular heart beat Clonidine Digoxin Diuretics Dobutamine Epinephrine Isoproterenol NSAIDs, medications for pain and inflammation, like ibuprofen or naproxen Reserpine This list may not describe all possible interactions. Give your health care provider a list of all the medicines, herbs, non-prescription drugs, or dietary supplements you use. Also tell them if you smoke, drink alcohol, or use illegal drugs. Some items may interact with your medicine. What should I watch for while using this medication? Visit your care team for regular checks on your progress. Check your blood pressure as directed. Know what your blood pressure should be and when to contact your care team. This medication may affect your coordination, reaction time, or judgment. Do not drive or operate machinery until you know how this medication affects you. Sit up or stand slowly to reduce the risk of dizzy or fainting spells. Drinking alcohol with this medication can increase the risk of these side effects. Do not suddenly stop taking this medication. This may increase your risk of side effects, such as chest pain and heart attack. If you no longer need to take this medication, your care team will lower the dose slowly over time to decrease the risk of side effects. If you are going to need surgery or a procedure, tell your care team that you are using this medication. This medication may affect blood glucose levels. It can also mask the symptoms of low blood sugar, such as a rapid heartbeat and tremors. If you have diabetes,  it is important to check your blood sugar often while you are taking this medication. Do not treat yourself for coughs, colds, or pain while you are using this medication without asking your care team for advice. Some medications may increase your blood pressure. What side effects may I notice from receiving this medication? Side effects  that you should report to your care team as soon as possible: Allergic reactions--skin rash, itching, hives, swelling of the face, lips, tongue, or throat Heart failure--shortness of breath, swelling of the ankles, feet, or hands, sudden weight gain, unusual weakness or fatigue Low blood pressure--dizziness, feeling faint or lightheaded, blurry vision Raynaud's--cool, numb, or painful fingers or toes that may change color from pale, to blue, to red Slow heartbeat--dizziness, feeling faint or lightheaded, confusion, trouble breathing, unusual weakness or fatigue Worsening mood or feelings of depression Side effects that usually do not require medical attention (report to your care team if they continue or are bothersome): Diarrhea Fatigue Nausea Vivid dreams or nightmares This list may not describe all possible side effects. Call your doctor for medical advice about side effects. You may report side effects to FDA at 1-800-FDA-1088. Where should I keep my medication? Keep out of the reach of children and pets. Store at room temperature between 20 and 25 degrees C (68 and 77 degrees F). Throw away any unused medication after the expiration date. NOTE: This sheet is a summary. It may not cover all possible information. If you have questions about this medicine, talk to your doctor, pharmacist, or health care provider.  2024 Elsevier/Gold Standard (2022-06-20 00:00:00)

## 2023-01-19 LAB — HEMOGLOBIN A1C W/OUT EAG: Hgb A1c MFr Bld: 6.4 % of total Hgb — ABNORMAL HIGH (ref <5.7)

## 2023-01-19 LAB — CBC WITH DIFFERENTIAL/PLATELET
Absolute Monocytes: 604 cells/uL (ref 200–950)
Basophils Absolute: 31 cells/uL (ref 0–200)
Basophils Relative: 0.5 %
Eosinophils Absolute: 128 cells/uL (ref 15–500)
Eosinophils Relative: 2.1 %
HCT: 41.9 % (ref 38.5–50.0)
Hemoglobin: 13.7 g/dL (ref 13.2–17.1)
Lymphs Abs: 1964 cells/uL (ref 850–3900)
MCH: 33.8 pg — ABNORMAL HIGH (ref 27.0–33.0)
MCHC: 32.7 g/dL (ref 32.0–36.0)
MCV: 103.5 fL — ABNORMAL HIGH (ref 80.0–100.0)
MPV: 9.6 fL (ref 7.5–12.5)
Monocytes Relative: 9.9 %
Neutro Abs: 3373 cells/uL (ref 1500–7800)
Neutrophils Relative %: 55.3 %
Platelets: 187 10*3/uL (ref 140–400)
RBC: 4.05 10*6/uL — ABNORMAL LOW (ref 4.20–5.80)
RDW: 11.8 % (ref 11.0–15.0)
Total Lymphocyte: 32.2 %
WBC: 6.1 10*3/uL (ref 3.8–10.8)

## 2023-01-19 LAB — COMPLETE METABOLIC PANEL WITH GFR
AG Ratio: 1.7 (calc) (ref 1.0–2.5)
ALT: 19 U/L (ref 9–46)
AST: 18 U/L (ref 10–35)
Albumin: 4.9 g/dL (ref 3.6–5.1)
Alkaline phosphatase (APISO): 62 U/L (ref 35–144)
BUN: 17 mg/dL (ref 7–25)
CO2: 27 mmol/L (ref 20–32)
Calcium: 10.4 mg/dL — ABNORMAL HIGH (ref 8.6–10.3)
Chloride: 101 mmol/L (ref 98–110)
Creat: 1.16 mg/dL (ref 0.70–1.28)
Globulin: 2.9 g/dL (calc) (ref 1.9–3.7)
Glucose, Bld: 213 mg/dL — ABNORMAL HIGH (ref 65–99)
Potassium: 5.4 mmol/L — ABNORMAL HIGH (ref 3.5–5.3)
Sodium: 140 mmol/L (ref 135–146)
Total Bilirubin: 0.5 mg/dL (ref 0.2–1.2)
Total Protein: 7.8 g/dL (ref 6.1–8.1)
eGFR: 64 mL/min/{1.73_m2} (ref >=60)

## 2023-01-19 LAB — LIPID PANEL
Cholesterol: 151 mg/dL (ref <200)
HDL: 55 mg/dL (ref >=40)
LDL Cholesterol (Calc): 72 mg/dL (calc)
Non-HDL Cholesterol (Calc): 96 mg/dL (calc) (ref <130)
Total CHOL/HDL Ratio: 2.7 (calc) (ref <5.0)
Triglycerides: 162 mg/dL — ABNORMAL HIGH (ref <150)

## 2023-01-19 LAB — IRON,TIBC AND FERRITIN PANEL
%SAT: 43 % (calc) (ref 20–48)
Ferritin: 46 ng/mL (ref 24–380)
Iron: 149 ug/dL (ref 50–180)
TIBC: 349 mcg/dL (calc) (ref 250–425)

## 2023-01-19 LAB — VITAMIN B12: Vitamin B-12: >2000 pg/mL — ABNORMAL HIGH (ref 200–1100)

## 2023-01-19 LAB — TSH: TSH: 2.08 mIU/L (ref 0.40–4.50)

## 2023-01-23 ENCOUNTER — Other Ambulatory Visit: Payer: Self-pay | Admitting: Nurse Practitioner

## 2023-01-31 NOTE — Progress Notes (Unsigned)
Assessment and Plan:  There are no diagnoses linked to this encounter.    Further disposition pending results of labs. Discussed med's effects and SE's.   Over 30 minutes of exam, counseling, chart review, and critical decision making was performed.   Future Appointments  Date Time Provider Department Center  02/01/2023  9:45 AM Raynelle Dick, NP [facility] None  03/08/2023 10:00 AM Croitoru, Rachelle Hora, MD [facility] None  05/08/2023 11:00 AM [name], MD [facility] None  01/23/2024 11:30 AM Raynelle Dick, NP [facility] None    ------------------------------------------------------------------------------------------------------------------   HPI There were no vitals taken for this visit. 77 y.o.male presents for  Past Medical History:  Diagnosis Date   Anemia    low iron   Arthritis    Cancer (HCC)    skin cancer on face   Cataracts, bilateral    Coronary artery disease    CABG - 2008   Diabetes mellitus without complication (HCC)    type II   GERD (gastroesophageal reflux disease)    Hepatitis    Hyperlipidemia    Hypertension    pt states he does not have HTN, takes Atenolol for migraines   Hypothyroidism    Migraine headache with aura    takes Atenolol   Sleep apnea    does not use cpap     Allergies  Allergen Reactions   Gemfibrozil Other (See Comments)    Muscle aches   Ace Inhibitors Cough   Lipitor [Atorvastatin] Other (See Comments)    Joint pain.    Current Outpatient Medications on File Prior to Visit  Medication Sig   atenolol (TENORMIN) 50 MG tablet Take 1 tablet (50 mg total) by mouth daily.   Blood Glucose Monitoring Suppl DEVI Use kit to check blood sugar once a day   Cholecalciferol 5000 UNITS TABS Take 1 tablet by mouth daily.   clopidogrel (PLAVIX) 75 MG tablet Take 1 tablet (75 mg total) by mouth daily.   Cyanocobalamin (B-12) 1000 MCG SUBL Place 1 tablet under the tongue daily. 2500 mcg  every other day   ezetimibe (ZETIA)  10 MG tablet Take 1 tablet (10 mg total) by mouth daily.   famotidine (PEPCID) 20 MG tablet TAKE 1 TABLET BY MOUTH TWICE A DAY (Patient taking differently: Take 20 mg by mouth daily as needed for indigestion.)   glucose blood (CONTOUR NEXT TEST) test strip USE TO TEST BLOOD SUGAR ONCE DAILY   Lancets MISC Use to test blood sugar once daily   levothyroxine (SYNTHROID) 50 MCG tablet TAKE 1 TABLET BY MOUTH DAILY ON AN EMPTY STOMACH WITH ONLY WATER FOR 30 MINUTES & NO ANTACID MEDS, CALCIUM OR MAGNESIUM FOR 4 HOURS & AVOID BIOTIN   metFORMIN (GLUCOPHAGE-XR) 500 MG 24 hr tablet TAKE 2 TABLETS BY MOUTH 2 TIMES PER DAY WITH MEALS FOR DIABETES   Multiple Vitamin (MULTIVITAMIN) tablet Take 1 tablet by mouth at bedtime.    Omega-3 Fatty Acids (FISH OIL) 1000 MG CAPS Take 1,000 mg by mouth at bedtime.    rosuvastatin (CRESTOR) 10 MG tablet Take 1 tablet (10 mg total) by mouth See admin instructions. Take one tablet by mouth twice a week Sundays and Wednesdays   No current facility-administered medications on file prior to visit.    ROS: all negative except above.   Physical Exam:  There were no vitals taken for this visit.  General Appearance: Well nourished, in no apparent distress. Eyes: PERRLA, EOMs, conjunctiva no swelling or erythema Sinuses: No Frontal/maxillary tenderness  ENT/Mouth: Ext aud canals clear, TMs without erythema, bulging. No erythema, swelling, or exudate on post pharynx.  Tonsils not swollen or erythematous. Hearing normal.  Neck: Supple, thyroid normal.  Respiratory: Respiratory effort normal, BS equal bilaterally without rales, rhonchi, wheezing or stridor.  Cardio: RRR with no MRGs. Brisk peripheral pulses without edema.  Abdomen: Soft, + BS.  Non tender, no guarding, rebound, hernias, masses. Lymphatics: Non tender without lymphadenopathy.  Musculoskeletal: Full ROM, 5/5 strength, normal gait.  Skin: Warm, dry without rashes, lesions, ecchymosis.  Neuro: Cranial nerves  intact. Normal muscle tone, no cerebellar symptoms. Sensation intact.  Psych: Awake and oriented X 3, normal affect, Insight and Judgment appropriate.     Raynelle Dick, NP 11:00 AM [facility]

## 2023-02-01 ENCOUNTER — Encounter: Payer: Self-pay | Admitting: Nurse Practitioner

## 2023-02-01 ENCOUNTER — Ambulatory Visit (INDEPENDENT_AMBULATORY_CARE_PROVIDER_SITE_OTHER): Payer: Medicare Other | Admitting: Nurse Practitioner

## 2023-02-01 ENCOUNTER — Other Ambulatory Visit: Payer: Self-pay | Admitting: Nurse Practitioner

## 2023-02-01 VITALS — BP 132/72 | HR 63 | Temp 97.5°F | Ht 70.0 in | Wt 175.0 lb

## 2023-02-01 DIAGNOSIS — I10 Essential (primary) hypertension: Secondary | ICD-10-CM | POA: Diagnosis not present

## 2023-02-01 DIAGNOSIS — G459 Transient cerebral ischemic attack, unspecified: Secondary | ICD-10-CM

## 2023-02-01 DIAGNOSIS — G43101 Migraine with aura, not intractable, with status migrainosus: Secondary | ICD-10-CM

## 2023-02-01 NOTE — Patient Instructions (Signed)

## 2023-02-05 ENCOUNTER — Other Ambulatory Visit: Payer: Self-pay | Admitting: Internal Medicine

## 2023-02-05 DIAGNOSIS — G43101 Migraine with aura, not intractable, with status migrainosus: Secondary | ICD-10-CM

## 2023-03-08 ENCOUNTER — Ambulatory Visit: Payer: Medicare Other | Attending: Cardiovascular Disease | Admitting: Cardiovascular Disease

## 2023-03-08 ENCOUNTER — Encounter: Payer: Self-pay | Admitting: Cardiovascular Disease

## 2023-03-08 VITALS — BP 137/68 | HR 65 | Ht 70.5 in | Wt 176.6 lb

## 2023-03-08 DIAGNOSIS — E1122 Type 2 diabetes mellitus with diabetic chronic kidney disease: Secondary | ICD-10-CM | POA: Diagnosis not present

## 2023-03-08 DIAGNOSIS — E7800 Pure hypercholesterolemia, unspecified: Secondary | ICD-10-CM | POA: Insufficient documentation

## 2023-03-08 DIAGNOSIS — G459 Transient cerebral ischemic attack, unspecified: Secondary | ICD-10-CM | POA: Insufficient documentation

## 2023-03-08 DIAGNOSIS — I25810 Atherosclerosis of coronary artery bypass graft(s) without angina pectoris: Secondary | ICD-10-CM | POA: Diagnosis not present

## 2023-03-08 DIAGNOSIS — I10 Essential (primary) hypertension: Secondary | ICD-10-CM | POA: Diagnosis not present

## 2023-03-08 DIAGNOSIS — I700 Atherosclerosis of aorta: Secondary | ICD-10-CM | POA: Diagnosis not present

## 2023-03-08 DIAGNOSIS — G43109 Migraine with aura, not intractable, without status migrainosus: Secondary | ICD-10-CM | POA: Diagnosis not present

## 2023-03-08 DIAGNOSIS — N182 Chronic kidney disease, stage 2 (mild): Secondary | ICD-10-CM | POA: Insufficient documentation

## 2023-03-08 NOTE — Patient Instructions (Signed)

## 2023-03-08 NOTE — Progress Notes (Signed)
Cardiology office note    Date:  03/12/2023   ID:  Joshua, Higgins [birth year], MRN [id number]  PCP:  [name], MD  Cardiologist:  Klark Vanderhoef Electrophysiologist:  None   Evaluation Performed:  Follow-Up Visit  Chief Complaint:  CAD f/u  History of Present Illness:    Joshua Higgins is a 77 y.o. male who is now roughly 13 years after coronary bypass surgery (LIMA to LAD, SVG to ramus intermedius and OM, SVG to acute marginal branch of RCA), and also has a history of hyperlipidemia, hypertension, type 2 diabetes mellitus controlled with oral antidiabetics, erectile dysfunction, migraines.  He has developed statin myopathy repeatedly with daily doses of statins, but does tolerate rosuvastatin taken twice weekly.  He was briefly hospitalized for a transient ischemic attack manifesting primarily as expressive aphasia in January.  Workup was unremarkable.  He wore a 30-day monitor that did not show evidence of atrial fibrillation.  His echocardiogram was normal.  CT angiogram of the head and neck did show atherosclerotic calcifications of the aortic arch and arch vessels, but less than 50% stenoses.  There was no evidence of acute stroke, but there was evidence of "extensive chronic small vessel ischemic disease" with ASPECTS score of 10.  He feels great.  He remains physically active.  Chops and carries his own firewood into the house.  Does not have chest pain or shortness of breath with activity and denies edema, orthopnea, PND, palpitations, dizziness or syncope.    His migraines did not do as well when he switch from atenolol to verapamil and he is back on atenolol. He does have intermittent problems with back pain manifesting as left-sided sciatica he has had 2 previous back surgeries (Dr. Danielle Dess).  Of note, he never had angina pectoris and his CAD was detected with a stress test.  He had a near normal PET scan in October 2023 (moderate ischemia in a small portion of the  basal inferior wall; EF 55% at rest, 61% stress).  Incidental findings of a patulous esophagus and aortic atherosclerosis on the CT report and as well as multilevel degenerative changes in the spine.  Metabolic profile is acceptable, but not as good as in 2023: with an LDL cholesterol of 72 (up a little from the year before when it was only 36), HDL 55, hemoglobin A1c 6.4% (also up a little from 5.9% the year before), normal renal function.  Continues to take rosuvastatin only twice weekly and takes ezetimibe.  He did not tolerate daily doses of statin.  He does not smoke.   Past Medical History:  Diagnosis Date   Anemia    low iron   Arthritis    Cancer (HCC)    skin cancer on face   Cataracts, bilateral    Coronary artery disease    CABG - 2008   Diabetes mellitus without complication (HCC)    type II   GERD (gastroesophageal reflux disease)    Hepatitis    Hyperlipidemia    Hypertension    pt states he does not have HTN, takes Atenolol for migraines   Hypothyroidism    Migraine headache with aura    takes Atenolol   Sleep apnea    does not use cpap   Past Surgical History:  Procedure Laterality Date   BACK SURGERY     CARDIAC SURGERY     Bypass   CATARACT EXTRACTION, BILATERAL Bilateral 2018   Dr. Nile Riggs   COLONOSCOPY  CORONARY ANGIOPLASTY  2001   CORONARY ARTERY BYPASS GRAFT     ELBOW SURGERY Left    due to damaged nerve   KNEE SURGERY Right    arthroscopic   LUMBAR LAMINECTOMY/DECOMPRESSION MICRODISCECTOMY Right 08/04/2014   Procedure: Right Lumbar four-five Microdiskectomy;  Surgeon: Barnett Abu, MD;  Location: [facility];  Service: Neurosurgery;  Laterality: Right;  Right L4-5 Microdiskectomy   SURGERY SCROTAL / TESTICULAR     as a child/teenager     Current Meds  Medication Sig   atenolol (TENORMIN) 50 MG tablet Take 1 tablet (50 mg total) by mouth daily.   Blood Glucose Monitoring Suppl DEVI Use kit to check blood sugar once a day   Cholecalciferol  5000 UNITS TABS Take 1 tablet by mouth daily.   clopidogrel (PLAVIX) 75 MG tablet TAKE 1 TABLET BY MOUTH EVERY DAY   Cyanocobalamin (B-12) 1000 MCG SUBL Place 1 tablet under the tongue daily. 2500 mcg  every other day   ezetimibe (ZETIA) 10 MG tablet Take 1 tablet (10 mg total) by mouth daily.   famotidine (PEPCID) 20 MG tablet TAKE 1 TABLET BY MOUTH TWICE A DAY (Patient taking differently: Take 20 mg by mouth daily as needed for indigestion.)   Lancets MISC Use to test blood sugar once daily   levothyroxine (SYNTHROID) 50 MCG tablet TAKE 1 TABLET BY MOUTH DAILY ON AN EMPTY STOMACH WITH ONLY WATER FOR 30 MINUTES & NO ANTACID MEDS, CALCIUM OR MAGNESIUM FOR 4 HOURS & AVOID BIOTIN   metFORMIN (GLUCOPHAGE-XR) 500 MG 24 hr tablet TAKE 2 TABLETS BY MOUTH 2 TIMES PER DAY WITH MEALS FOR DIABETES   Multiple Vitamin (MULTIVITAMIN) tablet Take 1 tablet by mouth at bedtime.    Omega-3 Fatty Acids (FISH OIL) 1000 MG CAPS Take 1,000 mg by mouth at bedtime.    rosuvastatin (CRESTOR) 10 MG tablet Take 1 tablet (10 mg total) by mouth See admin instructions. Take one tablet by mouth twice a week Sundays and Wednesdays   [DISCONTINUED] glucose blood (CONTOUR NEXT TEST) test strip USE TO TEST BLOOD SUGAR ONCE DAILY     Allergies:   Gemfibrozil, Ace inhibitors, and Lipitor [atorvastatin]   Social History   Tobacco Use   Smoking status: Former    Current packs/day: 0.00    Types: Cigarettes    Quit date: 10/01/1973    Years since quitting: 49.4   Smokeless tobacco: Never  Substance Use Topics   Alcohol use: No   Drug use: No     Family Hx: The patient's family history includes Arthritis in his mother; CAD in his brother and sister; Diabetes in his maternal grandmother; Heart attack in his brother and father; Hypertension in his brother; Ulcers in his mother.  ROS:   Please see the history of present illness.   All other systems are reviewed and are negative.   Prior CV studies:   The following studies  were reviewed today:  Notes/imaging studies/Labs from hospitalization for TIA in January 2024.  PET scan 04/19/2022   The study is grossly normal. The study is low risk.   LV perfusion is mildly abnormal. There is evidence of a small amount of ischemia. There is no evidence of infarction. Defect 1: There is a small, 1 segment defect with moderate reduction in uptake present in the basal inferior location(s) that is reversible. There is abnormal wall motion in the defect area. Consistent with ischemia.   Rest left ventricular function is normal. Rest EF: 55 %. Stress  left ventricular function is normal. Stress EF: 61 %. End diastolic cavity size is normal. End systolic cavity size is normal. No evidence of transient ischemic dilation (TID) noted.   Myocardial blood flow reserve is not reported in this patient due to technical or patient-specific concerns that affect accuracy: prior revascularization.   Coronary calcium assessment not performed due to prior revascularization.  Echocardiogram 07/19/2022   1. Left ventricular ejection fraction, by estimation, is 55 to 60%. The  left ventricle has normal function. The left ventricle has no regional  wall motion abnormalities. There is mild concentric left ventricular  hypertrophy. Left ventricular diastolic  parameters are consistent with Grade I diastolic dysfunction (impaired  relaxation).   2. Right ventricular systolic function is normal. The right ventricular  size is normal.   3. The mitral valve is degenerative. No evidence of mitral valve  regurgitation.   4. The aortic valve is tricuspid. There is mild calcification of the  aortic valve. There is mild thickening of the aortic valve. Aortic valve  regurgitation is not visualized. Aortic valve sclerosis/calcification is  present, without any evidence of  aortic stenosis.   5. The inferior vena cava is normal in size with greater than 50%  respiratory variability, suggesting right  atrial pressure of 3 mmHg.   Arrhythmia monitor 30-day 08/31/2022    The dominant rhythm is normal sinus rhythm with normal circadian variation.   There are rare premature atrial contractions and rare premature ventricular contractions.  There is no evidence of complex atrial or ventricular arrhythmia.  There is no evidence of significant bradycardia or pauses.   Normal 30-day arrhythmia monitor. Specifically, there is no evidence of atrial fibrillation.  Labs/Other Tests and Data Reviewed:    EKG: Ordered today, shows sinus rhythm, Borderline left axis deviation, not meeting criteria for left anterior fascicular block, nonspecific ST-T changes.  Normal QTc 413 ms  Recent Labs: 10/07/2022: Magnesium 1.9 01/18/2023: ALT 19; BUN 17; Creat 1.16; Hemoglobin 13.7; Platelets 187; Potassium 5.4; Sodium 140; TSH 2.08   Lipid Panel     Component Value Date/Time   CHOL 151 01/18/2023 1126   TRIG 162 (H) 01/18/2023 1126   HDL 55 01/18/2023 1126   CHOLHDL 2.7 01/18/2023 1126   VLDL 15 07/19/2022 0509   LDLCALC 72 01/18/2023 1126     Recent Lipid Panel Lab Results  Component Value Date/Time   CHOL 151 01/18/2023 11:26 AM   TRIG 162 (H) 01/18/2023 11:26 AM   HDL 55 01/18/2023 11:26 AM   CHOLHDL 2.7 01/18/2023 11:26 AM   LDLCALC 72 01/18/2023 11:26 AM    Wt Readings from Last 3 Encounters:  03/08/23 176 lb 9.6 oz (80.1 kg)  02/01/23 175 lb (79.4 kg)  01/18/23 177 lb 3.2 oz (80.4 kg)     Objective:    Vital Signs:  BP 137/68   Pulse 65   Ht 5' 10.5" (1.791 m)   Wt 176 lb 9.6 oz (80.1 kg)   SpO2 96%   BMI 24.98 kg/m     General: Alert, oriented x3, no distress, appears lean and fit Head: no evidence of trauma, PERRL, EOMI, no exophtalmos or lid lag, no myxedema, no xanthelasma; normal ears, nose and oropharynx Neck: normal jugular venous pulsations and no hepatojugular reflux; brisk carotid pulses without delay and no carotid bruits Chest: clear to auscultation, no signs of  consolidation by percussion or palpation, normal fremitus, symmetrical and full respiratory excursions Cardiovascular: normal position and quality of the apical impulse, regular  rhythm, normal first and second heart sounds, no murmurs, rubs or gallops Abdomen: no tenderness or distention, no masses by palpation, no abnormal pulsatility or arterial bruits, normal bowel sounds, no hepatosplenomegaly Extremities: no clubbing, cyanosis or edema; 2+ radial, ulnar and brachial pulses bilaterally; 2+ right femoral, posterior tibial and dorsalis pedis pulses; 2+ left femoral, posterior tibial and dorsalis pedis pulses; no subclavian or femoral bruits Neurological: grossly nonfocal Psych: Normal mood and affect   ASSESSMENT & PLAN:    1. Primary hypertension   2. Coronary artery disease involving coronary bypass graft of native heart without angina pectoris   3. TIA (transient ischemic attack)   4. Migraine with aura and without status migrainosus, not intractable   5. Hypercholesterolemia   6. Type 2 diabetes mellitus with stage 2 chronic kidney disease, without long-term current use of insulin (HCC)   7. Atherosclerosis of abdominal aorta (HCC)      CAD s/p CABG: He is asymptomatic (he has never had angina coronary sufficiency was detected with a stress test).  Normal left ventricular systolic function and low risk PET scan less than a year ago.  Going forward we will plan treadmill stress test every other year. TIA: Occurred while on aspirin.  Now on clopidogrel.  Has small vessel disease but no significant carotid stenoses. Migraines: Back on atenolol which has worked better than the verapamil. HLP: Acceptable lipid profile even though he only takes rosuvastatin twice weekly (he had myopathy with higher doses).  Continue ezetimibe and current rosuvastatin dosing.  He notes that his LDL, HDL and glucose control have all deteriorated a bit, suspect this is due to less discipline with his  diet. PreDM: Adequate glycemic control, but not as good as the year before. HTN: Adequate control. Atherosclerosis of the aorta: On imaging studies this is evident in both the thoracic and abdominal aorta.  Normal caliber without aneurysm.   Patient Instructions  Medication Instructions:  No changes *If you need a refill on your cardiac medications before your next appointment, please call your pharmacy*  Follow-Up: At [facility], you and your health needs are our priority.  As part of our continuing mission to provide you with exceptional heart care, we have created designated Provider Care Teams.  These Care Teams include your primary Cardiologist (physician) and Advanced Practice Providers (APPs -  Physician Assistants and Nurse Practitioners) who all work together to provide you with the care you need, when you need it.  We recommend signing up for the patient portal called "MyChart".  Sign up information is provided on this After Visit Summary.  MyChart is used to connect with patients for Virtual Visits (Telemedicine).  Patients are able to view lab/test results, encounter notes, upcoming appointments, etc.  Non-urgent messages can be sent to your provider as well.   To learn more about what you can do with MyChart, go to [email or web address].    Your next appointment:   1 year(s)  Provider:   Thurmon Fair, MD       Signed, Thurmon Fair, MD  03/12/2023 1:49 PM    [facility]

## 2023-03-09 ENCOUNTER — Other Ambulatory Visit: Payer: Self-pay | Admitting: Nurse Practitioner

## 2023-03-09 ENCOUNTER — Telehealth: Payer: Self-pay | Admitting: Nurse Practitioner

## 2023-03-09 DIAGNOSIS — E1122 Type 2 diabetes mellitus with diabetic chronic kidney disease: Secondary | ICD-10-CM

## 2023-03-09 MED ORDER — CONTOUR NEXT TEST VI STRP: ORAL_STRIP | 12 refills | Status: DC

## 2023-03-09 NOTE — Telephone Encounter (Signed)
Patient is requesting a refill on Contour Next Test strips to [facility] in [place]

## 2023-03-12 ENCOUNTER — Encounter: Payer: Self-pay | Admitting: Cardiovascular Disease

## 2023-03-12 DIAGNOSIS — R7303 Prediabetes: Secondary | ICD-10-CM | POA: Insufficient documentation

## 2023-03-12 HISTORY — DX: Prediabetes: R73.03

## 2023-03-14 DIAGNOSIS — L570 Actinic keratosis: Secondary | ICD-10-CM | POA: Diagnosis not present

## 2023-03-14 DIAGNOSIS — C44329 Squamous cell carcinoma of skin of other parts of face: Secondary | ICD-10-CM | POA: Diagnosis not present

## 2023-03-14 DIAGNOSIS — Z85828 Personal history of other malignant neoplasm of skin: Secondary | ICD-10-CM | POA: Diagnosis not present

## 2023-03-14 DIAGNOSIS — L821 Other seborrheic keratosis: Secondary | ICD-10-CM | POA: Diagnosis not present

## 2023-03-14 DIAGNOSIS — Z08 Encounter for follow-up examination after completed treatment for malignant neoplasm: Secondary | ICD-10-CM | POA: Diagnosis not present

## 2023-03-14 DIAGNOSIS — D485 Neoplasm of uncertain behavior of skin: Secondary | ICD-10-CM | POA: Diagnosis not present

## 2023-03-16 ENCOUNTER — Emergency Department (HOSPITAL_COMMUNITY): Payer: Medicare Other

## 2023-03-16 ENCOUNTER — Emergency Department (HOSPITAL_COMMUNITY)
Admission: EM | Admit: 2023-03-16 | Discharge: 2023-03-16 | Disposition: A | Discharge status: Home / Self Care | Payer: Medicare Other | Attending: Emergency Medicine | Admitting: Emergency Medicine

## 2023-03-16 ENCOUNTER — Encounter (HOSPITAL_COMMUNITY): Payer: Self-pay

## 2023-03-16 ENCOUNTER — Other Ambulatory Visit: Payer: Self-pay

## 2023-03-16 DIAGNOSIS — Z7902 Long term (current) use of antithrombotics/antiplatelets: Secondary | ICD-10-CM | POA: Insufficient documentation

## 2023-03-16 DIAGNOSIS — J069 Acute upper respiratory infection, unspecified: Secondary | ICD-10-CM | POA: Insufficient documentation

## 2023-03-16 DIAGNOSIS — R4182 Altered mental status, unspecified: Secondary | ICD-10-CM | POA: Diagnosis not present

## 2023-03-16 DIAGNOSIS — R42 Dizziness and giddiness: Secondary | ICD-10-CM | POA: Diagnosis not present

## 2023-03-16 DIAGNOSIS — Z79899 Other long term (current) drug therapy: Secondary | ICD-10-CM | POA: Diagnosis not present

## 2023-03-16 DIAGNOSIS — R000 Tachycardia, unspecified: Secondary | ICD-10-CM | POA: Diagnosis not present

## 2023-03-16 DIAGNOSIS — Z1152 Encounter for screening for COVID-19: Secondary | ICD-10-CM | POA: Diagnosis not present

## 2023-03-16 DIAGNOSIS — B349 Viral infection, unspecified: Secondary | ICD-10-CM | POA: Insufficient documentation

## 2023-03-16 DIAGNOSIS — I6782 Cerebral ischemia: Secondary | ICD-10-CM | POA: Diagnosis not present

## 2023-03-16 DIAGNOSIS — R509 Fever, unspecified: Secondary | ICD-10-CM | POA: Diagnosis not present

## 2023-03-16 DIAGNOSIS — R531 Weakness: Secondary | ICD-10-CM | POA: Diagnosis not present

## 2023-03-16 DIAGNOSIS — R739 Hyperglycemia, unspecified: Secondary | ICD-10-CM | POA: Diagnosis not present

## 2023-03-16 LAB — CBC WITH DIFFERENTIAL/PLATELET
Abs Immature Granulocytes: 0.06 10*3/uL (ref 0.00–0.07)
Basophils Absolute: 0 10*3/uL (ref 0.0–0.1)
Basophils Relative: 0 %
Eosinophils Absolute: 0 10*3/uL (ref 0.0–0.5)
Eosinophils Relative: 0 %
HCT: 34.8 % — ABNORMAL LOW (ref 39.0–52.0)
Hemoglobin: 11.5 g/dL — ABNORMAL LOW (ref 13.0–17.0)
Immature Granulocytes: 1 %
Lymphocytes Relative: 11 %
Lymphs Abs: 1 10*3/uL (ref 0.7–4.0)
MCH: 34.4 pg — ABNORMAL HIGH (ref 26.0–34.0)
MCHC: 33 g/dL (ref 30.0–36.0)
MCV: 104.2 fL — ABNORMAL HIGH (ref 80.0–100.0)
Monocytes Absolute: 0.6 10*3/uL (ref 0.1–1.0)
Monocytes Relative: 7 %
Neutro Abs: 7.6 10*3/uL (ref 1.7–7.7)
Neutrophils Relative %: 81 %
Platelets: 146 10*3/uL — ABNORMAL LOW (ref 150–400)
RBC: 3.34 MIL/uL — ABNORMAL LOW (ref 4.22–5.81)
RDW: 12.6 % (ref 11.5–15.5)
WBC: 9.3 10*3/uL (ref 4.0–10.5)
nRBC: 0 % (ref 0.0–0.2)

## 2023-03-16 LAB — COMPREHENSIVE METABOLIC PANEL
ALT: 20 U/L (ref 0–44)
AST: 27 U/L (ref 15–41)
Albumin: 3.5 g/dL (ref 3.5–5.0)
Alkaline Phosphatase: 44 U/L (ref 38–126)
Anion gap: 11 (ref 5–15)
BUN: 15 mg/dL (ref 8–23)
CO2: 24 mmol/L (ref 22–32)
Calcium: 8.7 mg/dL — ABNORMAL LOW (ref 8.9–10.3)
Chloride: 102 mmol/L (ref 98–111)
Creatinine, Ser: 1.13 mg/dL (ref 0.61–1.24)
GFR, Estimated: >60 mL/min (ref >60)
Glucose, Bld: 203 mg/dL — ABNORMAL HIGH (ref 70–99)
Potassium: 3.8 mmol/L (ref 3.5–5.1)
Sodium: 137 mmol/L (ref 135–145)
Total Bilirubin: 0.8 mg/dL (ref 0.3–1.2)
Total Protein: 6.2 g/dL — ABNORMAL LOW (ref 6.5–8.1)

## 2023-03-16 LAB — I-STAT CG4 LACTIC ACID, ED
Lactic Acid, Venous: 2.6 mmol/L (ref 0.5–1.9)
Lactic Acid, Venous: 1.7 mmol/L (ref 0.5–1.9)

## 2023-03-16 LAB — URINALYSIS, W/ REFLEX TO CULTURE (INFECTION SUSPECTED)
Bilirubin Urine: NEGATIVE
Glucose, UA: 150 mg/dL — AB
Hgb urine dipstick: NEGATIVE
Ketones, ur: NEGATIVE mg/dL
Leukocytes,Ua: NEGATIVE
Nitrite: NEGATIVE
Protein, ur: NEGATIVE mg/dL
Specific Gravity, Urine: 1.016 (ref 1.005–1.030)
pH: 5 (ref 5.0–8.0)

## 2023-03-16 LAB — PROTIME-INR
INR: 1.2 (ref 0.8–1.2)
Prothrombin Time: 15.2 s (ref 11.4–15.2)

## 2023-03-16 LAB — APTT: aPTT: 25 s (ref 24–36)

## 2023-03-16 LAB — SARS CORONAVIRUS 2 BY RT PCR: SARS Coronavirus 2 by RT PCR: NEGATIVE

## 2023-03-16 MED ORDER — LACTATED RINGERS IV BOLUS (SEPSIS)
1000.0000 mL | Freq: Once | INTRAVENOUS | Status: AC
  Administered 2023-03-16: 1000 mL via INTRAVENOUS

## 2023-03-16 NOTE — ED Provider Notes (Signed)
[place] EMERGENCY DEPARTMENT AT [facility] Provider Note   CSN: [id number] Arrival date & time: 03/16/23  6160     History  Chief Complaint  Patient presents with   Weakness   Fever    Joshua Higgins is a 77 y.o. male.  Patient presents to the emergency department for evaluation of weakness.  Patient had an episode where his legs became very weak and he could not stand up.  No loss of consciousness.  Patient noted to have a fever and was given Tylenol prior to arrival.  Patient does report that he has been sick for 1 to 2 days with cough and congestion.       Home Medications Prior to Admission medications   Medication Sig Start Date End Date Taking? Authorizing Provider  atenolol (TENORMIN) 50 MG tablet Take 1 tablet (50 mg total) by mouth daily. 01/18/23 01/18/24 Yes Raynelle Dick, NP  Cholecalciferol 5000 UNITS TABS Take 1 tablet by mouth at bedtime.   Yes [provider]  clopidogrel (PLAVIX) 75 MG tablet TAKE 1 TABLET BY MOUTH EVERY DAY 02/01/23  Yes Raynelle Dick, NP  Cyanocobalamin (B-12) 1000 MCG SUBL Place 1 tablet under the tongue once a week.   Yes [provider]  ezetimibe (ZETIA) 10 MG tablet Take 1 tablet (10 mg total) by mouth daily. 07/20/22  Yes Croitoru, Mihai, MD  famotidine (PEPCID) 20 MG tablet TAKE 1 TABLET BY MOUTH TWICE A DAY Patient taking differently: Take 20 mg by mouth daily as needed for indigestion. 05/12/22  Yes Raynelle Dick, NP  levothyroxine (SYNTHROID) 50 MCG tablet TAKE 1 TABLET BY MOUTH DAILY ON AN EMPTY STOMACH WITH ONLY WATER FOR 30 MINUTES & NO ANTACID MEDS, CALCIUM OR MAGNESIUM FOR 4 HOURS & AVOID BIOTIN 08/16/22  Yes Raynelle Dick, NP  metFORMIN (GLUCOPHAGE-XR) 500 MG 24 hr tablet TAKE 2 TABLETS BY MOUTH 2 TIMES PER DAY WITH MEALS FOR DIABETES 10/20/22  Yes Adela Glimpse, NP  Multiple Vitamin (MULTIVITAMIN) tablet Take 1 tablet by mouth at bedtime.    Yes [provider]  Omega-3  Fatty Acids (FISH OIL) 1000 MG CAPS Take 1,000 mg by mouth at bedtime.    Yes [provider]  rosuvastatin (CRESTOR) 10 MG tablet Take 1 tablet (10 mg total) by mouth See admin instructions. Take one tablet by mouth twice a week Sundays and Wednesdays 01/23/23  Yes Raynelle Dick, NP  Blood Glucose Monitoring Suppl DEVI Use kit to check blood sugar once a day 09/03/18   Judd Gaudier, NP  glucose blood (CONTOUR NEXT TEST) test strip USE TO TEST BLOOD SUGAR ONCE DAILY 03/09/23   Raynelle Dick, NP  Lancets MISC Use to test blood sugar once daily 03/10/22   Adela Glimpse, NP      Allergies    Gemfibrozil, Ace inhibitors, and Lipitor [atorvastatin]    Review of Systems   Review of Systems  Physical Exam Updated Vital Signs BP (!) 141/88   Pulse (!) 107   Temp 98.8 F (37.1 C) (Oral)   Resp 16   SpO2 98%  Physical Exam Vitals and nursing note reviewed.  Constitutional:      General: He is not in acute distress.    Appearance: He is well-developed.  HENT:     Head: Normocephalic and atraumatic.     Mouth/Throat:     Mouth: Mucous membranes are moist.  Eyes:     General: Vision grossly intact. Gaze  aligned appropriately.     Extraocular Movements: Extraocular movements intact.     Conjunctiva/sclera: Conjunctivae normal.  Cardiovascular:     Rate and Rhythm: Normal rate and regular rhythm.     Pulses: Normal pulses.     Heart sounds: Normal heart sounds, S1 normal and S2 normal. No murmur heard.    No friction rub. No gallop.  Pulmonary:     Effort: Pulmonary effort is normal. No respiratory distress.     Breath sounds: Normal breath sounds.  Abdominal:     Palpations: Abdomen is soft.     Tenderness: There is no abdominal tenderness. There is no guarding or rebound.     Hernia: No hernia is present.  Musculoskeletal:        General: No swelling.     Cervical back: Full passive range of motion without pain, normal range of motion and neck supple. No pain with  movement, spinous process tenderness or muscular tenderness. Normal range of motion.     Right lower leg: No edema.     Left lower leg: No edema.  Skin:    General: Skin is warm and dry.     Capillary Refill: Capillary refill takes less than 2 seconds.     Findings: No ecchymosis, erythema, lesion or wound.  Neurological:     Mental Status: He is alert and oriented to person, place, and time.     GCS: GCS eye subscore is 4. GCS verbal subscore is 5. GCS motor subscore is 6.     Cranial Nerves: Cranial nerves 2-12 are intact.     Sensory: Sensation is intact.     Motor: Motor function is intact. No weakness or abnormal muscle tone.     Coordination: Coordination is intact.     Comments: Extraocular muscle movement: normal No visual field cut Pupils: equal and reactive both direct and consensual response is normal No nystagmus present    Sensory function is intact to light touch, pinprick Proprioception intact  Grip strength 5/5 symmetric in upper extremities No pronator drift Normal finger to nose bilaterally  Lower extremity strength 5/5 against gravity   Psychiatric:        Mood and Affect: Mood normal.        Speech: Speech normal.        Behavior: Behavior normal.     ED Results / Procedures / Treatments   Labs (all labs ordered are listed, but only abnormal results are displayed) Labs Reviewed  COMPREHENSIVE METABOLIC PANEL - Abnormal; Notable for the following components:      Result Value   Glucose, Bld 203 (*)    Calcium 8.7 (*)    Total Protein 6.2 (*)    All other components within normal limits  CBC WITH DIFFERENTIAL/PLATELET - Abnormal; Notable for the following components:   RBC 3.34 (*)    Hemoglobin 11.5 (*)    HCT 34.8 (*)    MCV 104.2 (*)    MCH 34.4 (*)    Platelets 146 (*)    All other components within normal limits  URINALYSIS, W/ REFLEX TO CULTURE (INFECTION SUSPECTED) - Abnormal; Notable for the following components:   Glucose, UA 150 (*)     Bacteria, UA RARE (*)    All other components within normal limits  I-STAT CG4 LACTIC ACID, ED - Abnormal; Notable for the following components:   Lactic Acid, Venous 2.6 (*)    All other components within normal limits  SARS CORONAVIRUS 2 BY RT  PCR  CULTURE, BLOOD (ROUTINE X 2)  CULTURE, BLOOD (ROUTINE X 2)  PROTIME-INR  APTT  I-STAT CG4 LACTIC ACID, ED    EKG None  Radiology CT HEAD WO CONTRAST ( )  Result Date: 03/16/2023 CLINICAL DATA:  Mental status change with unknown cause.  Weakness EXAM: CT HEAD WITHOUT CONTRAST TECHNIQUE: Contiguous axial images were obtained from the base of the skull through the vertex without intravenous contrast. RADIATION DOSE REDUCTION: This exam was performed according to the departmental dose-optimization program which includes automated exposure control, adjustment of the mA and/or kV according to patient size and/or use of iterative reconstruction technique. COMPARISON:  07/19/2022 brain MRI FINDINGS: Brain: No evidence of acute infarction, hemorrhage, hydrocephalus, extra-axial collection or mass lesion/mass effect. Extensive chronic small vessel ischemic low-density in the cerebral white matter. Mild for age cerebral volume loss. High-grade foramen magnum stenosis with cervicomedullary impingement due to degeneration at the atlantodental interval with partially calcified posterior ligamentous thickening. Vascular: No hyperdense vessel or unexpected calcification. Skull: Negative for fracture or focal lesion. Sinuses/Orbits: No acute finding IMPRESSION: 1. 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 2. Advanced chronic small vessel ischemia. 3. No acute finding. Electronically Signed   By: Tiburcio Pea M.D.   On: 03/16/2023 04:51   DG Chest Port 1 View  Result Date: 03/16/2023 CLINICAL DATA:  Weakness and fever EXAM: PORTABLE CHEST 1 VIEW COMPARISON:  01/23/2022 FINDINGS: The heart size and  mediastinal contours are within normal limits. Both lungs are clear. The visualized skeletal structures are unremarkable. Remote median sternotomy IMPRESSION: No active disease. Electronically Signed   By: Deatra Robinson M.D.   On: 03/16/2023 03:08    Procedures Procedures    Medications Ordered in ED Medications  lactated ringers bolus 1,000 mL (0 mLs Intravenous Stopped 03/16/23 0336)    ED Course/ Medical Decision Making/ A&P                                 Medical Decision Making Amount and/or Complexity of Data Reviewed Labs: ordered. Radiology: ordered. ECG/medicine tests: ordered.   Differential diagnosis considered includes, but not limited to: TIA; Stroke; ICH; Seizure; electrolyte abnormality; hypoglycemia; toxic/pharmacologic causes; CNS infection; psychiatric disorder  Patient with brief altered mental status at home.  He was very weak could not stand up on his own.  Patient was found to have a fever at that time.  He has had URI symptoms over the last 1 to 2 days.  Patient given Tylenol prior to arrival, fever is coming down.  Patient reports that he does feel better.  Mildly tachycardic at arrival, otherwise no significant findings on exam.  Patient had workup for possible sepsis.  Lactic acid was mildly elevated at arrival, second lactic after IV fluids is normal.  Patient's heart rate is now normal, all vital signs are normal.  He has not had any further symptoms other than cough here in the department.  Chest x-ray clear, no evidence of pneumonia.  Urinalysis without signs of infection.  Remainder of blood work normal, normal electrolytes, kidney function.  No leukocytosis.  COVID-negative.  Suspect that patient had brief alteration in generalized weakness secondary to the fever that resolved with defervesced since.  Likely viral etiology, no bacterial etiology found.  Blood and urine culture pending.  At this point I do not have a reason to admit the patient, will follow  cultures.  Return if symptoms  worsen.        Final Clinical Impression(s) / ED Diagnoses Final diagnoses:  Viral illness  Upper respiratory tract infection, unspecified type    Rx / DC Orders ED Discharge Orders     None         Dent Plantz, Canary Brim, MD 03/16/23 [phone]

## 2023-03-16 NOTE — ED Triage Notes (Signed)
From home, with cough, fever, and feeling really weak.

## 2023-03-21 LAB — CULTURE, BLOOD (ROUTINE X 2)
Culture: NO GROWTH
Culture: NO GROWTH
Special Requests: ADEQUATE
Special Requests: ADEQUATE

## 2023-03-22 DIAGNOSIS — D0439 Carcinoma in situ of skin of other parts of face: Secondary | ICD-10-CM | POA: Diagnosis not present

## 2023-03-22 NOTE — Progress Notes (Signed)
Assessment and Plan:  Khaliel was seen today for follow-up.  Diagnoses and all orders for this visit:  Marwin was seen today for medication management.  Diagnoses and all orders for this visit:  Essential hypertension - continue medications, DASH diet, exercise and monitor at home. Call if greater than 130/80.   Migraine with aura and with status migrainosus, not intractable Continue meds and monitor  Medication management -     CBC with Differential/Platelet -     COMPLETE METABOLIC PANEL WITH GFR -     cephALEXin (KEFLEX) 250 MG capsule; Take 1 capsule (250 mg total) by mouth 3 (three) times daily for 5 days.  Abnormal gait Will refer to neurosurgery  If symptoms worsen before visit go immediately to the ER  Skin infection Complete course of Keflex and follow with skin surgery center -     cephALEXin (KEFLEX) 250 MG capsule; Take 1 capsule (250 mg total) by mouth 3 (three) times daily for 5 days.  Dizziness Neuro exam unable to heel/toe walk, wide gait with some swaying to each side If neuro symptoms worsen before visit go immediately to the ER -     CBC with Differential/Platelet -     COMPLETE METABOLIC PANEL WITH GFR -     Ambulatory referral to Neurosurgery -     meclizine (ANTIVERT) 25 MG tablet; 1/2 pill up to 3 times daily for motion sickness/dizziness  Stenosis of foramen magnum Results discovered on CT of head 03/16/23:High-grade foramen magnum stenosis with cervicomedullary impingement due to degeneration at the atlantodental interval with partially calcified posterior ligamentous thickening. If neuro symptoms worsen before visit go immediately to the ER -     Ambulatory referral to Neurosurgery   Weight Loss Encourage high protein and nutrient rich foods Monitor weight   Further disposition pending results of labs. Discussed med's effects and SE's.   Over 30 minutes of exam, counseling, chart review, and critical decision making was performed.    Future Appointments  Date Time Provider Department Center  05/08/2023 11:00 AM [name], MD [facility] None  01/23/2024 11:30 AM Raynelle Dick, NP [facility] None    ------------------------------------------------------------------------------------------------------------------   HPI BP 124/70   Pulse 63   Temp (!) 97.3 F (36.3 C)   Ht 5\' 10"  (1.778 m)   Wt 173 lb 6.4 oz (78.7 kg)   SpO2 97%   BMI 24.88 kg/m  77 y.o.male presents for BP reevaluation  On 01/18/23: Does not like Verapamil because of increased fatigue wants to switch back to Atenolol- has the medication at home and does not need new script currently 02/01/23 he noted BP was well controlled, energy had returned and denied headaches  He had Squamous cell carcinoma removed from left upper cheek 2 days ago at skin surgery center.  He goes back early next week to have sutures removed. Sutures are intact, redness surrounding the incision  He is currently on Atenolol 50 mg every day and BP is well controlled.  He is noticing dizziness which is happening frequently since his ER visit 03/16/23. He previously completed neuro PT and had some success. CT on 03/16/23 High-grade foramen magnum stenosis with cervicomedullary impingement due to degeneration at the atlantodental interval with partially calcified posterior ligamentous thickening. Since the ER visit 03/16/23 his wife states he is increasingly more lethargic, balance is worsening, memory worsening.  Not wanting to go out, sleeping a lot, appetite greatly decreased.  BP Readings from Last 3 Encounters:  03/24/23 124/70  03/16/23  118/63  03/08/23 137/68   Pulse Readings from Last 3 Encounters:  03/24/23 63  03/16/23 80  03/08/23 65    Migraines are still well controlled with switch from verapamil to atenolol.   Blood sugar checked yesterday after coffee with splenda and milk- 130.  BMI is Body mass index is 24.88 kg/m., he has been working on diet and  exercise. He is eating less at home and no longer wants to go out to eat.  Wt Readings from Last 3 Encounters:  03/24/23 173 lb 6.4 oz (78.7 kg)  03/08/23 176 lb 9.6 oz (80.1 kg)  02/01/23 175 lb (79.4 kg)      03/24/2023   11:25 AM  MMSE - Mini Mental State Exam  Orientation to time 2  Orientation to Place 5  Registration 3  Attention/ Calculation 0  Recall 2  Language- name 2 objects 2  Language- repeat 1  Language- follow 3 step command 3  Language- read & follow direction 1  Write a sentence 0  Copy design 0  Total score 19    Past Medical History:  Diagnosis Date   Anemia    low iron   Arthritis    Cancer (HCC)    skin cancer on face   Cataracts, bilateral    Coronary artery disease    CABG - 2008   Diabetes mellitus without complication (HCC)    type II   GERD (gastroesophageal reflux disease)    Hepatitis    Hyperlipidemia    Hypertension    pt states he does not have HTN, takes Atenolol for migraines   Hypothyroidism    Migraine headache with aura    takes Atenolol   Sleep apnea    does not use cpap     Allergies  Allergen Reactions   Gemfibrozil Other (See Comments)    Muscle aches   Ace Inhibitors Cough   Lipitor [Atorvastatin] Other (See Comments)    Joint pain.    Current Outpatient Medications on File Prior to Visit  Medication Sig   atenolol (TENORMIN) 50 MG tablet Take 1 tablet (50 mg total) by mouth daily.   Blood Glucose Monitoring Suppl DEVI Use kit to check blood sugar once a day   Cholecalciferol 5000 UNITS TABS Take 1 tablet by mouth at bedtime.   clopidogrel (PLAVIX) 75 MG tablet TAKE 1 TABLET BY MOUTH EVERY DAY   Cyanocobalamin (B-12) 1000 MCG SUBL Place 1 tablet under the tongue once a week.   ezetimibe (ZETIA) 10 MG tablet Take 1 tablet (10 mg total) by mouth daily.   famotidine (PEPCID) 20 MG tablet TAKE 1 TABLET BY MOUTH TWICE A DAY (Patient taking differently: Take 20 mg by mouth daily as needed for indigestion.)   glucose  blood (CONTOUR NEXT TEST) test strip USE TO TEST BLOOD SUGAR ONCE DAILY   Lancets MISC Use to test blood sugar once daily   levothyroxine (SYNTHROID) 50 MCG tablet TAKE 1 TABLET BY MOUTH DAILY ON AN EMPTY STOMACH WITH ONLY WATER FOR 30 MINUTES & NO ANTACID MEDS, CALCIUM OR MAGNESIUM FOR 4 HOURS & AVOID BIOTIN   metFORMIN (GLUCOPHAGE-XR) 500 MG 24 hr tablet TAKE 2 TABLETS BY MOUTH 2 TIMES PER DAY WITH MEALS FOR DIABETES   Multiple Vitamin (MULTIVITAMIN) tablet Take 1 tablet by mouth at bedtime.    Omega-3 Fatty Acids (FISH OIL) 1000 MG CAPS Take 1,000 mg by mouth at bedtime.    rosuvastatin (CRESTOR) 10 MG tablet Take 1 tablet (10  mg total) by mouth See admin instructions. Take one tablet by mouth twice a week Sundays and Wednesdays   No current facility-administered medications on file prior to visit.    Review of Systems  Constitutional:  Positive for malaise/fatigue and weight loss. Negative for fever.  HENT:  Negative for congestion, ear pain, hearing loss, sinus pain and sore throat.   Eyes:  Negative for blurred vision and pain.  Respiratory:  Negative for cough, sputum production, shortness of breath and wheezing.   Cardiovascular:  Negative for chest pain, palpitations and leg swelling.  Gastrointestinal:  Negative for abdominal pain, constipation, diarrhea, heartburn, nausea and vomiting.  Genitourinary:  Negative for dysuria and frequency.  Musculoskeletal:  Positive for neck pain. Negative for falls.  Skin:  Negative for rash.       Squamous cell removed left cheek  Neurological:  Positive for dizziness, weakness and headaches. Negative for tingling, tremors, sensory change, speech change, seizures and loss of consciousness.  Endo/Heme/Allergies:  Bruises/bleeds easily.  Psychiatric/Behavioral:  Positive for memory loss.      Physical Exam: Performed with Dr. Oneta Rack and myself  BP 124/70   Pulse 63   Temp (!) 97.3 F (36.3 C)   Ht 5\' 10"  (1.778 m)   Wt 173 lb 6.4 oz  (78.7 kg)   SpO2 97%   BMI 24.88 kg/m   General Appearance: Frail, pale male who is less talkative and flat affect Eyes: Pupils dilated bilaterally. EOMS intact ENT/Mouth: Ext aud canals clear, TMs without erythema, bulging. No erythema, swelling, or exudate on post pharynx.  Hearing normal.  Neck: Supple, thyroid normal.  Respiratory: Respiratory effort normal, BS equal bilaterally without rales, rhonchi, wheezing or stridor.  Cardio: RRR with no MRGs. Brisk peripheral pulses without edema.  Abdomen: Soft, + BS.   Musculoskeletal: Full ROM, 5/5 strength, normal gait.  Cranial nerves: I: sense of smell present, II: pupils equal, round, reactive to light and accommodation, dilated bilaterally, VII: upper facial muscle function normal bilaterally, VII: lower facial muscle function normal bilaterally, XI: trapezius strength reduced bilaterally, XI: sternocleidomastoid strength reduced bilaterally, XI: neck flexion strength reduced Pronator drift positive abnormality of coordination wide based gait with some swaying to left or right.   Was able to do rapid alternating movements of hands. Normally very talkative and today is very quiet more flat affect than normal.  Memory is worse- MMSE 19/30 Skin: Warm, dry . 4-5 inch incision of left upper cheek intact with sutures. Surrounding tissue is red with some swelling.  Psych: Awake and oriented X 2, flat affect     Criss Pallone Hollie Salk, NP 11:13 AM [facility]

## 2023-03-24 ENCOUNTER — Ambulatory Visit (INDEPENDENT_AMBULATORY_CARE_PROVIDER_SITE_OTHER): Payer: Medicare Other | Admitting: Nurse Practitioner

## 2023-03-24 ENCOUNTER — Encounter: Payer: Self-pay | Admitting: Nurse Practitioner

## 2023-03-24 VITALS — BP 124/70 | HR 63 | Temp 97.3°F | Ht 70.0 in | Wt 173.4 lb

## 2023-03-24 DIAGNOSIS — E1122 Type 2 diabetes mellitus with diabetic chronic kidney disease: Secondary | ICD-10-CM

## 2023-03-24 DIAGNOSIS — R269 Unspecified abnormalities of gait and mobility: Secondary | ICD-10-CM

## 2023-03-24 DIAGNOSIS — R634 Abnormal weight loss: Secondary | ICD-10-CM

## 2023-03-24 DIAGNOSIS — G43101 Migraine with aura, not intractable, with status migrainosus: Secondary | ICD-10-CM | POA: Diagnosis not present

## 2023-03-24 DIAGNOSIS — Z79899 Other long term (current) drug therapy: Secondary | ICD-10-CM

## 2023-03-24 DIAGNOSIS — M4800 Spinal stenosis, site unspecified: Secondary | ICD-10-CM | POA: Diagnosis not present

## 2023-03-24 DIAGNOSIS — I10 Essential (primary) hypertension: Secondary | ICD-10-CM

## 2023-03-24 DIAGNOSIS — L089 Local infection of the skin and subcutaneous tissue, unspecified: Secondary | ICD-10-CM

## 2023-03-24 DIAGNOSIS — R42 Dizziness and giddiness: Secondary | ICD-10-CM | POA: Diagnosis not present

## 2023-03-24 DIAGNOSIS — N182 Chronic kidney disease, stage 2 (mild): Secondary | ICD-10-CM | POA: Diagnosis not present

## 2023-03-24 MED ORDER — CEPHALEXIN 250 MG PO CAPS
250.0000 mg | ORAL_CAPSULE | Freq: Three times a day (TID) | ORAL | 0 refills | Status: AC
Start: 2023-03-24 — End: 2023-03-29

## 2023-03-24 MED ORDER — MECLIZINE HCL 25 MG PO TABS
ORAL_TABLET | ORAL | 0 refills | Status: DC
Start: 2023-03-24 — End: 2023-05-09

## 2023-03-24 NOTE — Patient Instructions (Addendum)
Start Keflex 250 mg 1 tab three times a day for 5 days for possible skin infection of incision   CT on 03/16/23 showed High-grade foramen magnum stenosis with cervicomedullary impingement due to degeneration at the atlantodental interval with partially calcified posterior ligamentous thickening. Ordered CBC, CMP labwork Meclizine 25 mg 1/2 tab up to three times a day for dizziness as needed Urgent referral placed to Dr. Danielle Dess neurosurgery who has previously seen pt before  If dizziness or gait worsens go to ER

## 2023-03-25 LAB — COMPLETE METABOLIC PANEL WITH GFR
AG Ratio: 1.5 (calc) (ref 1.0–2.5)
ALT: 12 U/L (ref 9–46)
AST: 13 U/L (ref 10–35)
Albumin: 4.2 g/dL (ref 3.6–5.1)
Alkaline phosphatase (APISO): 78 U/L (ref 35–144)
BUN: 20 mg/dL (ref 7–25)
CO2: 28 mmol/L (ref 20–32)
Calcium: 10 mg/dL (ref 8.6–10.3)
Chloride: 101 mmol/L (ref 98–110)
Creat: 1.05 mg/dL (ref 0.70–1.28)
Globulin: 2.8 g/dL (calc) (ref 1.9–3.7)
Glucose, Bld: 158 mg/dL — ABNORMAL HIGH (ref 65–99)
Potassium: 5.7 mmol/L — ABNORMAL HIGH (ref 3.5–5.3)
Sodium: 139 mmol/L (ref 135–146)
Total Bilirubin: 0.5 mg/dL (ref 0.2–1.2)
Total Protein: 7 g/dL (ref 6.1–8.1)
eGFR: 72 mL/min/{1.73_m2} (ref >=60)

## 2023-03-25 LAB — CBC WITH DIFFERENTIAL/PLATELET
Absolute Monocytes: 543 cells/uL (ref 200–950)
Basophils Absolute: 39 cells/uL (ref 0–200)
Basophils Relative: 0.7 %
Eosinophils Absolute: 162 cells/uL (ref 15–500)
Eosinophils Relative: 2.9 %
HCT: 39.8 % (ref 38.5–50.0)
Hemoglobin: 12.5 g/dL — ABNORMAL LOW (ref 13.2–17.1)
Lymphs Abs: 1865 cells/uL (ref 850–3900)
MCH: 32.7 pg (ref 27.0–33.0)
MCHC: 31.4 g/dL — ABNORMAL LOW (ref 32.0–36.0)
MCV: 104.2 fL — ABNORMAL HIGH (ref 80.0–100.0)
MPV: 9.4 fL (ref 7.5–12.5)
Monocytes Relative: 9.7 %
Neutro Abs: 2990 cells/uL (ref 1500–7800)
Neutrophils Relative %: 53.4 %
Platelets: 288 10*3/uL (ref 140–400)
RBC: 3.82 10*6/uL — ABNORMAL LOW (ref 4.20–5.80)
RDW: 11.6 % (ref 11.0–15.0)
Total Lymphocyte: 33.3 %
WBC: 5.6 10*3/uL (ref 3.8–10.8)

## 2023-03-30 ENCOUNTER — Encounter: Payer: Medicare Other | Admitting: Internal Medicine

## 2023-03-31 ENCOUNTER — Ambulatory Visit: Payer: Medicare Other | Admitting: Nurse Practitioner

## 2023-04-06 NOTE — Progress Notes (Signed)
Assessment and Plan:  Joshua Higgins was seen today for follow-up.  Diagnoses and all orders for this visit:  Essential hypertension - continue medications, DASH diet, exercise and monitor at home. Call if greater than 130/80.   Migraine with aura and with status migrainosus, not intractable Continue meds and monitor  Dizziness Has improved but continues to occur intermittently Meclizine made symptoms worse - Keep upcoming neurology appointment  Stenosis of foramen magnum Results discovered on CT of head 03/16/23:High-grade foramen magnum stenosis with cervicomedullary impingement due to degeneration at the atlantodental interval with partially calcified posterior ligamentous thickening. If neuro symptoms worsen before visit go immediately to the ER -     Keep neurology appointment 04/19/23  Hyperkalemia - Avoid NoSalt product Recheck BMP today   Further disposition pending results of labs. Discussed med's effects and SE's.   Over 30 minutes of exam, counseling, chart review, and critical decision making was performed.   Future Appointments  Date Time Provider Department Center  05/08/2023 11:00 AM [name], MD [facility] None  01/23/2024 11:30 AM Raynelle Dick, NP [facility] None    ------------------------------------------------------------------------------------------------------------------   HPI BP 134/70   Pulse 64   Temp (!) 97.3 F (36.3 C)   Ht 5\' 10"  (1.778 m)   Wt 173 lb 12.8 oz (78.8 kg)   SpO2 95%   BMI 24.94 kg/m  77 y.o.male presents for BP reevaluation  On 01/18/23: Does not like Verapamil because of increased fatigue wants to switch back to Atenolol- has the medication at home and does not need new script currently 02/01/23 he noted BP was well controlled, energy had returned and denied headaches  He had Squamous cell carcinoma removed from left upper cheek 2 days ago at skin surgery center.   Sutures are intact, mild redness . Going to get  sutures out today.   He is currently on Atenolol 50 mg every day and BP is well controlled.  He is noticing dizziness which is happening intermittently since his ER visit 03/16/23. Dizziness has improved since 03/24/23 visit. Meclizine made dizziness worse so is not using. He previously completed neuro PT and had some success. CT on 03/16/23 High-grade foramen magnum stenosis with cervicomedullary impingement due to degeneration at the atlantodental interval with partially calcified posterior ligamentous thickening.  BP Readings from Last 3 Encounters:  04/10/23 134/70  03/24/23 124/70  03/16/23 118/63   Pulse Readings from Last 3 Encounters:  04/10/23 64  03/24/23 63  03/16/23 80    Migraines are still well controlled with switch from verapamil to atenolol.   BMI is Body mass index is 24.94 kg/m., he has been working on diet and exercise. He has started to eat better Wt Readings from Last 3 Encounters:  04/10/23 173 lb 12.8 oz (78.8 kg)  03/24/23 173 lb 6.4 oz (78.7 kg)  03/08/23 176 lb 9.6 oz (80.1 kg)     Past Medical History:  Diagnosis Date   Anemia    low iron   Arthritis    Cancer (HCC)    skin cancer on face   Cataracts, bilateral    Coronary artery disease    CABG - 2008   Diabetes mellitus without complication (HCC)    type II   GERD (gastroesophageal reflux disease)    Hepatitis    Hyperlipidemia    Hypertension    pt states he does not have HTN, takes Atenolol for migraines   Hypothyroidism    Migraine headache with aura    takes Atenolol  Sleep apnea    does not use cpap     Allergies  Allergen Reactions   Gemfibrozil Other (See Comments)    Muscle aches   Ace Inhibitors Cough   Lipitor [Atorvastatin] Other (See Comments)    Joint pain.    Current Outpatient Medications on File Prior to Visit  Medication Sig   atenolol (TENORMIN) 50 MG tablet Take 1 tablet (50 mg total) by mouth daily.   Blood Glucose Monitoring Suppl DEVI Use kit to check  blood sugar once a day   Cholecalciferol 5000 UNITS TABS Take 1 tablet by mouth at bedtime.   clopidogrel (PLAVIX) 75 MG tablet TAKE 1 TABLET BY MOUTH EVERY DAY   Cyanocobalamin (B-12) 1000 MCG SUBL Place 1 tablet under the tongue once a week.   ezetimibe (ZETIA) 10 MG tablet Take 1 tablet (10 mg total) by mouth daily.   famotidine (PEPCID) 20 MG tablet TAKE 1 TABLET BY MOUTH TWICE A DAY (Patient taking differently: Take 20 mg by mouth daily as needed for indigestion.)   glucose blood (CONTOUR NEXT TEST) test strip USE TO TEST BLOOD SUGAR ONCE DAILY   Lancets MISC Use to test blood sugar once daily   levothyroxine (SYNTHROID) 50 MCG tablet TAKE 1 TABLET BY MOUTH DAILY ON AN EMPTY STOMACH WITH ONLY WATER FOR 30 MINUTES & NO ANTACID MEDS, CALCIUM OR MAGNESIUM FOR 4 HOURS & AVOID BIOTIN   metFORMIN (GLUCOPHAGE-XR) 500 MG 24 hr tablet TAKE 2 TABLETS BY MOUTH 2 TIMES PER DAY WITH MEALS FOR DIABETES   Multiple Vitamin (MULTIVITAMIN) tablet Take 1 tablet by mouth at bedtime.    Omega-3 Fatty Acids (FISH OIL) 1000 MG CAPS Take 1,000 mg by mouth at bedtime.    rosuvastatin (CRESTOR) 10 MG tablet Take 1 tablet (10 mg total) by mouth See admin instructions. Take one tablet by mouth twice a week Sundays and Wednesdays   meclizine (ANTIVERT) 25 MG tablet 1/2 pill up to 3 times daily for motion sickness/dizziness (Patient not taking: Reported on 04/10/2023)   No current facility-administered medications on file prior to visit.    Review of Systems  Constitutional:  Positive for malaise/fatigue and weight loss. Negative for fever.  HENT:  Negative for congestion, ear pain, hearing loss, sinus pain and sore throat.   Eyes:  Negative for blurred vision and pain.  Respiratory:  Negative for cough, sputum production, shortness of breath and wheezing.   Cardiovascular:  Negative for chest pain, palpitations and leg swelling.  Gastrointestinal:  Negative for abdominal pain, constipation, diarrhea, heartburn,  nausea and vomiting.  Genitourinary:  Negative for dysuria and frequency.  Musculoskeletal:  Positive for neck pain. Negative for falls.  Skin:  Negative for rash.       Squamous cell removed left cheek  Neurological:  Positive for dizziness. Negative for tingling, tremors, sensory change, speech change, seizures, loss of consciousness, weakness and headaches.  Endo/Heme/Allergies:  Bruises/bleeds easily.  Psychiatric/Behavioral:  Positive for memory loss. Negative for suicidal ideas.      Physical Exam: Performed with Dr. Oneta Rack and myself  BP 134/70   Pulse 64   Temp (!) 97.3 F (36.3 C)   Ht 5\' 10"  (1.778 m)   Wt 173 lb 12.8 oz (78.8 kg)   SpO2 95%   BMI 24.94 kg/m   General Appearance: Very pleasant male in no apparent distress Eyes: PERRLA. EOMS intact ENT/Mouth: Ext aud canals clear, TMs without erythema, bulging. No erythema, swelling, or exudate on post pharynx.  Hearing  normal.  Neck: Supple, thyroid normal.  Respiratory: Respiratory effort normal, BS equal bilaterally without rales, rhonchi, wheezing or stridor.  Cardio: RRR with no MRGs. Brisk peripheral pulses without edema.  Abdomen: Soft, + BS.   Musculoskeletal: Full ROM, 5/5 strength, normal gait.  Neuro : alert, oriented x 3, CN2-12 intact, strength normal upper extremities and lower extremities, sensation normal throughout, DTRs 2+ throughout, no cerebellar signs, gait slow but stable Skin: Warm, dry . 4-5 inch incision of left upper cheek intact with sutures. No edema or redness.  Psych: Awake and oriented X 3, normal affect and behavior    Joshua Bau Hollie Salk, NP 2:49 PM [facility]

## 2023-04-07 ENCOUNTER — Ambulatory Visit: Payer: Medicare Other | Admitting: Nurse Practitioner

## 2023-04-10 ENCOUNTER — Encounter: Payer: Self-pay | Admitting: Nurse Practitioner

## 2023-04-10 ENCOUNTER — Ambulatory Visit (INDEPENDENT_AMBULATORY_CARE_PROVIDER_SITE_OTHER): Payer: Medicare Other | Admitting: Nurse Practitioner

## 2023-04-10 VITALS — BP 134/70 | HR 64 | Temp 97.3°F | Ht 70.0 in | Wt 173.8 lb

## 2023-04-10 DIAGNOSIS — G43101 Migraine with aura, not intractable, with status migrainosus: Secondary | ICD-10-CM

## 2023-04-10 DIAGNOSIS — M4800 Spinal stenosis, site unspecified: Secondary | ICD-10-CM | POA: Diagnosis not present

## 2023-04-10 DIAGNOSIS — E875 Hyperkalemia: Secondary | ICD-10-CM

## 2023-04-10 DIAGNOSIS — R42 Dizziness and giddiness: Secondary | ICD-10-CM

## 2023-04-10 DIAGNOSIS — I10 Essential (primary) hypertension: Secondary | ICD-10-CM

## 2023-04-10 NOTE — Patient Instructions (Signed)
Dizziness Dizziness is a common problem. It makes you feel unsteady or light-headed. You may feel like you are about to pass out (faint). Dizziness can lead to getting hurt if you stumble or fall. Dizziness can be caused by many things, including: Medicines. Not having enough water in your body (dehydration). Illness. Follow these instructions at home: Eating and drinking  Drink enough fluid to keep your pee (urine) pale yellow. This helps to keep you from getting dehydrated. Try to drink more clear fluids, such as water. Do not drink alcohol. Limit how much caffeine you drink or eat, if your doctor tells you to do that. Limit how much salt (sodium) you drink or eat, if your doctor tells you to do that. Activity  Avoid making quick movements. Stand up slowly from sitting in a chair, and steady yourself until you feel okay. In the morning, first sit up on the side of the bed. When you feel okay, stand up slowly while you hold onto something. Do this until you know that your balance is okay. If you need to stand in one place for a long time, move your legs often. Tighten and relax the muscles in your legs while you are standing. Do not drive or use machinery if you feel dizzy. Avoid bending down if you feel dizzy. Place items in your home so you can reach them easily without leaning over. Lifestyle Do not smoke or use any products that contain nicotine or tobacco. If you need help quitting, ask your doctor. Try to lower your stress level. You can do this by using methods such as yoga or meditation. Talk with your doctor if you need help. General instructions Watch your dizziness for any changes. Take over-the-counter and prescription medicines only as told by your doctor. Talk with your doctor if you think that you are dizzy because of a medicine that you are taking. Tell a friend or a family member that you are feeling dizzy. If he or she notices any changes in your behavior, have this  person call your doctor. Keep all follow-up visits. Contact a doctor if: Your dizziness does not go away. Your dizziness or light-headedness gets worse. You feel like you may vomit (are nauseous). You have trouble hearing. You have new symptoms. You are unsteady on your feet. You feel like the room is spinning. You have neck pain or a stiff neck. You have a fever. Get help right away if: You vomit or have watery poop (diarrhea), and you cannot eat or drink anything. You have trouble: Talking. Walking. Swallowing. Using your arms, hands, or legs. You feel generally weak. You are not thinking clearly, or you have trouble forming sentences. A friend or family member may notice this. You have: Chest pain. Pain in your belly (abdomen). Shortness of breath. Sweating. Your vision changes. You are bleeding. You have a very bad headache. These symptoms may be an emergency. Get help right away. Call your local emergency services (911 in the [place] Do not wait to see if the symptoms will go away. Do not drive yourself to the hospital. Summary Dizziness makes you feel unsteady or light-headed. You may feel like you are about to pass out (faint). Drink enough fluid to keep your pee (urine) pale yellow. Do not drink alcohol. Avoid making quick movements if you feel dizzy. Watch your dizziness for any changes. This information is not intended to replace advice given to you by your health care provider. Make sure you discuss any questions   you have with your health care provider. Document Revised: 05/22/2020 Document Reviewed: 05/25/2020 [employer] Patient Education  2024 [employer]  

## 2023-04-11 DIAGNOSIS — C44329 Squamous cell carcinoma of skin of other parts of face: Secondary | ICD-10-CM | POA: Diagnosis not present

## 2023-04-11 LAB — BASIC METABOLIC PANEL WITH GFR
BUN: 20 mg/dL (ref 7–25)
CO2: 25 mmol/L (ref 20–32)
Calcium: 9.7 mg/dL (ref 8.6–10.3)
Chloride: 104 mmol/L (ref 98–110)
Creat: 0.94 mg/dL (ref 0.70–1.28)
Glucose, Bld: 99 mg/dL (ref 65–99)
Potassium: 4.7 mmol/L (ref 3.5–5.3)
Sodium: 140 mmol/L (ref 135–146)
eGFR: 82 mL/min/{1.73_m2} (ref >=60)

## 2023-04-12 ENCOUNTER — Other Ambulatory Visit: Payer: Self-pay | Admitting: Nurse Practitioner

## 2023-04-19 DIAGNOSIS — G959 Disease of spinal cord, unspecified: Secondary | ICD-10-CM | POA: Diagnosis not present

## 2023-04-19 DIAGNOSIS — Z6825 Body mass index (BMI) 25.0-25.9, adult: Secondary | ICD-10-CM | POA: Diagnosis not present

## 2023-05-07 ENCOUNTER — Encounter: Payer: Self-pay | Admitting: Internal Medicine

## 2023-05-07 NOTE — Progress Notes (Unsigned)
Comprehensive Evaluation & Examination   Future Appointments  Date Time Provider Department  05/08/2023                        cpe 11:00 AM [name], MD [facility]  01/23/2024 11:30 AM Raynelle Dick, NP [facility]  05/15/2024 11:00 AM [name], MD [facility]         This very nice 77 y.o.  MWM presents for a comprehensive evaluation and management of multiple medical co-morbidities.  Patient has been followed for HTN, [name], [name], Hypothyroidism,  [name]  Patient has Aortic Atherosclerosis by CXR in 2017 .        HTN predates since 1989 . Patient's BP has been controlled at home.  Today's BP is at goal - 130/70 .  In 2001, Patient had PCA/Stent & in 2008 underwent CABG. In 2017, he had a negative ETT by Dr Royann Shivers. Patient denies any cardiac symptoms as chest pain, palpitations, shortness of breath, dizziness or ankle swelling.        Patient's hyperlipidemia is controlled with diet and Rosuvastatin.  Patient denies myalgias or other medication SE's. Last lipids were at goalexcept elevated Trig's :  Lab Results  Component Value Date   CHOL 151 01/18/2023   HDL 55 01/18/2023   LDLCALC 72 01/18/2023   TRIG 162 (H) 01/18/2023   CHOLHDL 2.7 01/18/2023                                                      [name] In 2020, patient had a low Vitamin B12 of 380 ( goal 450- 1,100 ) .        Patient has hx/o [name] w/CKD2 (2000)  and patient is on Metformin & denies reactive hypoglycemic symptoms, visual blurring, diabetic polys or paresthesias. Last A1c was still not at goal :   Lab Results  Component Value Date   HGBA1C 6.4 (H) 01/18/2023         Finally, patient has history of Vitamin D Deficiency ("33" /2008) and last vitamin D was near goal:   Lab Results  Component Value Date   VD25OH 58 10/07/2022            Patient was dx'd Hypothyroid in 2013  and started on Replacement therapy.   Current Outpatient Medications  Medication Instructions   atenolol  50 mg Daily   Cholecalciferol 5000 UNITS  1 tablet,Daily at bedtime   clopidogrel    75 mg  Daily   B-12 1000 mcg SL 1 tablet, Sublingual, Weekly   ezetimibe   10 mg Daily   famotidine  20 mg 2 times daily   Fish Oil   1,000 mg Daily at bedtime   levothyroxine 50 MCG tablet TAKE 1 TABLET DAILY   meclizine  25 MG tablet 1/2 pill up to 3 times daily for motion sickness/dizziness   metFORMIN -XR 500 MG TAKE 2 TABLETS  2 TIMES PER DAY     Multiple Vitamin  1 tablet Daily at bedtime   rosuvastatin  10 mg Take one tablet  twice a week Sun/Wed     Allergies  Allergen Reactions   Gemfibrozil Other (See Comments)    Muscle aches  Ace Inhibitors Cough   Lipitor [Atorvastatin] Other (See Comments)    Joint pain.     Past Medical History:  Diagnosis Date   Anemia    low iron   Arthritis    Cancer (HCC)    skin cancer on face   Cataracts, bilateral    Coronary artery disease    CABG - 2008   Diabetes mellitus without complication (HCC)    type II   GERD (gastroesophageal reflux disease)    Hepatitis    Hyperlipidemia    Hypertension    pt states he does not have HTN, takes Atenolol for migraines   Hypothyroidism    Migraine headache with aura    takes Atenolol   Sleep apnea    does not use cpap     Health Maintenance  Topic Date Due   FOOT EXAM  12/22/2020   COVID-19 Vaccine (5 - Booster for Moderna series) 02/20/2021   INFLUENZA VACCINE  02/01/2021   HEMOGLOBIN A1C  04/23/2021   OPHTHALMOLOGY EXAM  05/07/2021   TETANUS/TDAP  06/03/2025   Hepatitis C Screening  Completed   PNA vac Low Risk Adult  Completed   Zoster Vaccines- Shingrix  Completed   HPV VACCINES  Aged Out     Immunization History  Administered Date(s) Administered   DT  08/29/2003, 06/04/2015   Influenza Split 02/21/2019   Influenza, High Dose 04/03/2018, 02/21/2019, 03/05/2020    Influenza 04/16/2014, 03/30/2016, 04/02/2017   Moderna SARS-COV2  Vacc 10/21/2020   Moderna Sars-Covid-2 Vacc 07/16/2019, 08/16/2019, 04/27/2020   Pneumococcal -13 12/26/2013   Pneumococcal -23 01/29/2010   Zoster Recombinat (Shingrix) 01/31/2017, 07/05/2017, 08/09/2017   Zoster, Live 05/11/2010    Last Colon -  09/07/2016 - Neg - recommended no Repeat Colon due to age   Past Surgical History:  Procedure Laterality Date   BACK SURGERY     CARDIAC SURGERY     Bypass   CATARACT EXTRACTION, BILATERAL Bilateral 2018   Dr. Nile Riggs   COLONOSCOPY     CORONARY ANGIOPLASTY  2001   CORONARY ARTERY BYPASS GRAFT     ELBOW SURGERY Left    due to damaged nerve   KNEE SURGERY Right    arthroscopic   LUMBAR LAMINECTOMY/DECOMPRESSION MICRODISCECTOMY Right 08/04/2014   Procedure: Right Lumbar four-five Microdiskectomy;  Surgeon: Barnett Abu, MD;  Location: [facility];  Service: Neurosurgery;  Laterality: Right;  Right L4-5 Microdiskectomy   SURGERY SCROTAL / TESTICULAR     as a child/teenager     Family History  Problem Relation Age of Onset   Ulcers Mother    Arthritis Mother    Heart attack Father    CAD Sister    Heart attack Brother    CAD Brother    Hypertension Brother    Diabetes Maternal Grandmother     Social History   Socioeconomic History   Marital status: Married    Spouse name: Not on file   Number of children: Not on file   Years of education: Not on file   Highest education level: Not on file  Occupational History   Not on file  Tobacco Use   Smoking status: Former    Types: Cigarettes    Quit date: 10/01/1973    Years since quitting: 47.4   Smokeless tobacco: Never  Substance and Sexual Activity   Alcohol use: No   Drug use: No   Sexual activity: Not on file      ROS Constitutional: Denies fever, chills,  weight loss/gain, headaches, insomnia,  night sweats or change in appetite. Does c/o fatigue. Eyes: Denies redness, blurred vision, diplopia,  discharge, itchy or watery eyes.  ENT: Denies discharge, congestion, post nasal drip, epistaxis, sore throat, earache, hearing loss, dental pain, Tinnitus, Vertigo, Sinus pain or snoring.  Cardio: Denies chest pain, palpitations, irregular heartbeat, syncope, dyspnea, diaphoresis, orthopnea, PND, claudication or edema Respiratory: denies cough, dyspnea, DOE, pleurisy, hoarseness, laryngitis or wheezing.  Gastrointestinal: Denies dysphagia, heartburn, reflux, water brash, pain, cramps, nausea, vomiting, bloating, diarrhea, constipation, hematemesis, melena, hematochezia, jaundice or hemorrhoids Genitourinary: Denies dysuria, frequency, urgency, nocturia, hesitancy, discharge, hematuria or flank pain Musculoskeletal: Denies arthralgia, myalgia, stiffness, Jt. Swelling, pain, limp or strain/sprain. Denies Falls. Skin: Denies puritis, rash, hives, warts, acne, eczema or change in skin lesion Neuro: No weakness, tremor, incoordination, spasms, paresthesia or pain Psychiatric: Denies confusion, memory loss or sensory loss. Denies Depression. Endocrine: Denies change in weight, skin, hair change, nocturia, and paresthesia, diabetic polys, visual blurring or hyper / hypo glycemic episodes.  Heme/Lymph: No excessive bleeding, bruising or enlarged lymph nodes.   Physical Exam  BP 130/70   Pulse 80   Temp 97.9 F (36.6 C)   Resp 16   Ht 5' 10.5" (1.791 m)   Wt 174 lb (78.9 kg)   SpO2 96%   BMI 24.61 kg/m   General Appearance: Well nourished and well groomed and in no apparent distress.  Eyes: PERRLA, EOMs, conjunctiva no swelling or erythema, normal fundi and vessels. Sinuses: No frontal/maxillary tenderness ENT/Mouth: EACs patent / TMs  nl. Nares clear without erythema, swelling, mucoid exudates. Oral hygiene is good. No erythema, swelling, or exudate. Tongue normal, non-obstructing. Tonsils not swollen or erythematous. Hearing normal.  Neck: Supple, thyroid not palpable. No bruits, nodes or  JVD. Respiratory: Respiratory effort normal.  BS equal and clear bilateral without rales, rhonci, wheezing or stridor. Cardio: Heart sounds are normal with regular rate and rhythm and no murmurs, rubs or gallops. Peripheral pulses are normal and equal bilaterally without edema. No aortic or femoral bruits. Chest: symmetric with normal excursions and percussion.  Abdomen: Soft, with Nl bowel sounds. Nontender, no guarding, rebound, hernias, masses, or organomegaly.  Lymphatics: Non tender without lymphadenopathy.  Musculoskeletal: Full ROM all peripheral extremities, joint stability, 5/5 strength, and normal gait. Skin: Warm and dry without rashes, lesions, cyanosis, clubbing or  ecchymosis.  Neuro: Cranial nerves intact, reflexes equal bilaterally. Normal muscle tone, no cerebellar symptoms. Sensation intact.  Pysch: Alert and oriented X 3 with normal affect, insight and judgment appropriate.   Assessment and Plan   1. Essential hypertension  - EKG 12-Lead - Urinalysis, Routine w reflex microscopic - Microalbumin / creatinine urine ratio - CBC with Differential/Platelet - COMPLETE METABOLIC PANEL WITH GFR - Magnesium - TSH   2. Hyperlipidemia associated with type 2 diabetes mellitus (HCC)  - EKG 12-Lead - Lipid panel - TSH   3. Type 2 diabetes mellitus with stage 2 chronic kidney                                             disease, without long-term current use of insulin (HCC)  - EKG 12-Lead - HM DIABETES FOOT EXAM - PR LOW EXTEMITY NEUR EXAM DOCUM - Hemoglobin A1c - Insulin, random   4. Coronary artery disease involving coronary bypass graft of native heart with other forms of angina pectoris (HCC)  -  EKG 12-Lead   5. Atherosclerosis of abdominal aorta (HCC) by CXR in 2017   - EKG 12-Lead - Lipid panel   6. Vitamin D deficiency  - VITAMIN D 25 Hydroxy    7. Gastroesophageal reflux disease, unspecified whether esophagitis present  - CBC with  Differential/Platelet   8. B12 deficiency  - Vitamin B12   9. BPH with obstruction/lower urinary tract symptoms  - PSA   10. Screening for colorectal cancer  - POC Hemoccult Bld/Stl (3-Cd Home Screen); Future   11. Prostate cancer screening  - PSA   12. Screening for heart disease  - EKG 12-Lead   13. FHx: heart disease  - EKG 12-Lead   14. Former smoker  - EKG 12-Lead   15. Medication management  - Urinalysis, Routine w reflex microscopic - Microalbumin / creatinine urine ratio - Vitamin B12 - CBC with Differential/Platelet - COMPLETE METABOLIC PANEL WITH GFR - Magnesium - Lipid panel - TSH - Hemoglobin A1c - Insulin, random - VITAMIN D 25 Hydroxy          Patient was counseled in prudent diet, weight control to achieve/maintain BMI less than 25, BP monitoring, regular exercise and medications as discussed.  Discussed med effects and SE's. Routine screening labs and tests as requested with regular follow-up as recommended. Over 40 minutes of exam, counseling, chart review and high complex critical decision making was performed   Marinus Maw, MD

## 2023-05-07 NOTE — Patient Instructions (Signed)

## 2023-05-08 ENCOUNTER — Encounter: Payer: Self-pay | Admitting: Internal Medicine

## 2023-05-08 ENCOUNTER — Ambulatory Visit (INDEPENDENT_AMBULATORY_CARE_PROVIDER_SITE_OTHER): Payer: Medicare Other | Admitting: Internal Medicine

## 2023-05-08 VITALS — BP 130/70 | HR 80 | Temp 97.9°F | Resp 16 | Ht 70.5 in | Wt 174.0 lb

## 2023-05-08 DIAGNOSIS — E785 Hyperlipidemia, unspecified: Secondary | ICD-10-CM | POA: Diagnosis not present

## 2023-05-08 DIAGNOSIS — Z87891 Personal history of nicotine dependence: Secondary | ICD-10-CM | POA: Diagnosis not present

## 2023-05-08 DIAGNOSIS — N401 Enlarged prostate with lower urinary tract symptoms: Secondary | ICD-10-CM

## 2023-05-08 DIAGNOSIS — E1122 Type 2 diabetes mellitus with diabetic chronic kidney disease: Secondary | ICD-10-CM

## 2023-05-08 DIAGNOSIS — Z136 Encounter for screening for cardiovascular disorders: Secondary | ICD-10-CM

## 2023-05-08 DIAGNOSIS — E1169 Type 2 diabetes mellitus with other specified complication: Secondary | ICD-10-CM

## 2023-05-08 DIAGNOSIS — Z125 Encounter for screening for malignant neoplasm of prostate: Secondary | ICD-10-CM

## 2023-05-08 DIAGNOSIS — N182 Chronic kidney disease, stage 2 (mild): Secondary | ICD-10-CM

## 2023-05-08 DIAGNOSIS — Z8249 Family history of ischemic heart disease and other diseases of the circulatory system: Secondary | ICD-10-CM

## 2023-05-08 DIAGNOSIS — Z79899 Other long term (current) drug therapy: Secondary | ICD-10-CM

## 2023-05-08 DIAGNOSIS — E559 Vitamin D deficiency, unspecified: Secondary | ICD-10-CM

## 2023-05-08 DIAGNOSIS — N138 Other obstructive and reflux uropathy: Secondary | ICD-10-CM

## 2023-05-08 DIAGNOSIS — I700 Atherosclerosis of aorta: Secondary | ICD-10-CM

## 2023-05-08 DIAGNOSIS — E538 Deficiency of other specified B group vitamins: Secondary | ICD-10-CM

## 2023-05-08 DIAGNOSIS — I10 Essential (primary) hypertension: Secondary | ICD-10-CM

## 2023-05-08 DIAGNOSIS — K219 Gastro-esophageal reflux disease without esophagitis: Secondary | ICD-10-CM | POA: Diagnosis not present

## 2023-05-08 DIAGNOSIS — Z1211 Encounter for screening for malignant neoplasm of colon: Secondary | ICD-10-CM

## 2023-05-08 DIAGNOSIS — I25708 Atherosclerosis of coronary artery bypass graft(s), unspecified, with other forms of angina pectoris: Secondary | ICD-10-CM

## 2023-05-09 ENCOUNTER — Other Ambulatory Visit: Payer: Self-pay | Admitting: Internal Medicine

## 2023-05-09 LAB — LIPID PANEL
Cholesterol: 125 mg/dL (ref <200)
HDL: 48 mg/dL (ref >=40)
LDL Cholesterol (Calc): 53 mg/dL
Non-HDL Cholesterol (Calc): 77 mg/dL (ref <130)
Total CHOL/HDL Ratio: 2.6 (calc) (ref <5.0)
Triglycerides: 163 mg/dL — ABNORMAL HIGH (ref <150)

## 2023-05-09 LAB — CBC WITH DIFFERENTIAL/PLATELET
Absolute Lymphocytes: 1925 {cells}/uL (ref 850–3900)
Absolute Monocytes: 506 {cells}/uL (ref 200–950)
Basophils Absolute: 22 {cells}/uL (ref 0–200)
Basophils Relative: 0.4 %
Eosinophils Absolute: 193 {cells}/uL (ref 15–500)
Eosinophils Relative: 3.5 %
HCT: 37.5 % — ABNORMAL LOW (ref 38.5–50.0)
Hemoglobin: 12.2 g/dL — ABNORMAL LOW (ref 13.2–17.1)
MCH: 33.6 pg — ABNORMAL HIGH (ref 27.0–33.0)
MCHC: 32.5 g/dL (ref 32.0–36.0)
MCV: 103.3 fL — ABNORMAL HIGH (ref 80.0–100.0)
MPV: 9.4 fL (ref 7.5–12.5)
Monocytes Relative: 9.2 %
Neutro Abs: 2855 {cells}/uL (ref 1500–7800)
Neutrophils Relative %: 51.9 %
Platelets: 176 10*3/uL (ref 140–400)
RBC: 3.63 10*6/uL — ABNORMAL LOW (ref 4.20–5.80)
RDW: 12 % (ref 11.0–15.0)
Total Lymphocyte: 35 %
WBC: 5.5 10*3/uL (ref 3.8–10.8)

## 2023-05-09 LAB — HEMOGLOBIN A1C
Hgb A1c MFr Bld: 6.4 %{Hb} — ABNORMAL HIGH (ref <5.7)
Mean Plasma Glucose: 137 mg/dL
eAG (mmol/L): 7.6 mmol/L

## 2023-05-09 LAB — MAGNESIUM: Magnesium: 1.9 mg/dL (ref 1.5–2.5)

## 2023-05-09 LAB — COMPLETE METABOLIC PANEL WITH GFR
AG Ratio: 1.6 (calc) (ref 1.0–2.5)
ALT: 11 U/L (ref 9–46)
AST: 15 U/L (ref 10–35)
Albumin: 4.3 g/dL (ref 3.6–5.1)
Alkaline phosphatase (APISO): 62 U/L (ref 35–144)
BUN: 17 mg/dL (ref 7–25)
CO2: 31 mmol/L (ref 20–32)
Calcium: 9.7 mg/dL (ref 8.6–10.3)
Chloride: 102 mmol/L (ref 98–110)
Creat: 0.97 mg/dL (ref 0.70–1.28)
Globulin: 2.7 g/dL (ref 1.9–3.7)
Glucose, Bld: 138 mg/dL (ref 65–139)
Potassium: 4.7 mmol/L (ref 3.5–5.3)
Sodium: 140 mmol/L (ref 135–146)
Total Bilirubin: 0.6 mg/dL (ref 0.2–1.2)
Total Protein: 7 g/dL (ref 6.1–8.1)
eGFR: 79 mL/min/{1.73_m2} (ref >=60)

## 2023-05-09 LAB — URINALYSIS, ROUTINE W REFLEX MICROSCOPIC
Bilirubin Urine: NEGATIVE
Glucose, UA: NEGATIVE
Hgb urine dipstick: NEGATIVE
Ketones, ur: NEGATIVE
Leukocytes,Ua: NEGATIVE
Nitrite: NEGATIVE
Protein, ur: NEGATIVE
Specific Gravity, Urine: 1.015 (ref 1.001–1.035)
pH: 6 (ref 5.0–8.0)

## 2023-05-09 LAB — PSA: PSA: 0.79 ng/mL (ref <=4.00)

## 2023-05-09 LAB — INSULIN, RANDOM: Insulin: 49.3 u[IU]/mL — ABNORMAL HIGH

## 2023-05-09 LAB — MICROALBUMIN / CREATININE URINE RATIO
Creatinine, Urine: 95 mg/dL (ref 20–320)
Microalb Creat Ratio: 7 mg/g{creat} (ref <30)
Microalb, Ur: 0.7 mg/dL

## 2023-05-09 LAB — VITAMIN D 25 HYDROXY (VIT D DEFICIENCY, FRACTURES): Vit D, 25-Hydroxy: 76 ng/mL (ref 30–100)

## 2023-05-09 LAB — TSH: TSH: 2.12 m[IU]/L (ref 0.40–4.50)

## 2023-05-09 LAB — VITAMIN B12: Vitamin B-12: 1273 pg/mL — ABNORMAL HIGH (ref 200–1100)

## 2023-05-09 NOTE — Progress Notes (Signed)
<>*<>*<>*<>*<>*<>*<>*<>*<>*<>*<>*<>*<>*<>*<>*<>*<>*<>*<>*<>*<>*<>*<>*<>*<> <>*<>*<>*<>*<>*<>*<>*<>*<>*<>*<>*<>*<>*<>*<>*<>*<>*<>*<>*<>*<>*<>*<>*<>*<>  -Test results slightly outside the reference range are not unusual. If there is anything important, I will review this with you,  otherwise it is considered normal test values.  If you have further questions,  please do not hesitate to contact me at the office or via My Chart.   <>*<>*<>*<>*<>*<>*<>*<>*<>*<>*<>*<>*<>*<>*<>*<>*<>*<>*<>*<>*<>*<>*<>*<>*<> <>*<>*<>*<>*<>*<>*<>*<>*<>*<>*<>*<>*<>*<>*<>*<>*<>*<>*<>*<>*<>*<>*<>*<>*<>  -  Vitamin B12 levels are elevated , So please limit to only take 1 x /week   <>*<>*<>*<>*<>*<>*<>*<>*<>*<>*<>*<>*<>*<>*<>*<>*<>*<>*<>*<>*<>*<>*<>*<>*<> <>*<>*<>*<>*<>*<>*<>*<>*<>*<>*<>*<>*<>*<>*<>*<>*<>*<>*<>*<>*<>*<>*<>*<>*<>  -  Chol = 125  -  Excellent  !  !  !  - Very low risk for Heart Attack  / Stroke  <>*<>*<>*<>*<>*<>*<>*<>*<>*<>*<>*<>*<>*<>*<>*<>*<>*<>*<>*<>*<>*<>*<>*<>*<> <>*<>*<>*<>*<>*<>*<>*<>*<>*<>*<>*<>*<>*<>*<>*<>*<>*<>*<>*<>*<>*<>*<>*<>*<>  -  A1c = 6.4% - still a little elevated - Goal is less than 6.0%  - So continue your Metformin 4 tabs / day  &                                                                Diet is still very Important  It is very important that you work harder with diet by                                    avoiding all foods that are white except chicken, fish & calliflower.  - Avoid white rice  (brown & wild rice is OK),   - Avoid white potatoes  (sweet potatoes in moderation is OK),   White bread or wheat bread or anything made out of                                            white flour like bagels, donuts, rolls, buns, biscuits, cakes,  - pastries, cookies, pizza crust, and pasta (made from  white flour & egg whites)   - vegetarian pasta or spinach or wheat pasta is OK.  - Multigrain breads like Arnold's, Pepperidge Farm or                                                multigrain sandwich thins or high fiber breads like   E[place] bread or "Dave's Killer" breads that are                                                    4 to 5 grams fiber per slice !  are best.    <>*<>*<>*<>*<>*<>*<>*<>*<>*<>*<>*<>*<>*<>*<>*<>*<>*<>*<>*<>*<>*<>*<>*<>*<> <>*<>*<>*<>*<>*<>*<>*<>*<>*<>*<>*<>*<>*<>*<>*<>*<>*<>*<>*<>*<>*<>*<>*<>*<>  - PSA is very Low - No Prostate Cancer  - Great  !  <>*<>*<>*<>*<>*<>*<>*<>*<>*<>*<>*<>*<>*<>*<>*<>*<>*<>*<>*<>*<>*<>*<>*<>*<> <>*<>*<>*<>*<>*<>*<>*<>*<>*<>*<>*<>*<>*<>*<>*<>*<>*<>*<>*<>*<>*<>*<>*<>*<>  - Vitamin D = 76  -  Excellent   -   Please keep dosage  same    <>*<>*<>*<>*<>*<>*<>*<>*<>*<>*<>*<>*<>*<>*<>*<>*<>*<>*<>*<>*<>*<>*<>*<>*<> <>*<>*<>*<>*<>*<>*<>*<>*<>*<>*<>*<>*<>*<>*<>*<>*<>*<>*<>*<>*<>*<>*<>*<>*<>  - All Else - CBC - Kidneys - Electrolytes - Liver - Magnesium & Thyroid    - all  Normal / OK  <>*<>*<>*<>*<>*<>*<>*<>*<>*<>*<>*<>*<>*<>*<>*<>*<>*<>*<>*<>*<>*<>*<>*<>*<> <>*<>*<>*<>*<>*<>*<>*<>*<>*<>*<>*<>*<>*<>*<>*<>*<>*<>*<>*<>*<>*<>*<>*<>*<>  -  Keep up the [facility]  Work  !   <>*<>*<>*<>*<>*<>*<>*<>*<>*<>*<>*<>*<>*<>*<>*<>*<>*<>*<>*<>*<>*<>*<>*<>*<> <>*<>*<>*<>*<>*<>*<>*<>*<>*<>*<>*<>*<>*<>*<>*<>*<>*<>*<>*<>*<>*<>*<>*<>*<>

## 2023-05-11 DIAGNOSIS — M47811 Spondylosis without myelopathy or radiculopathy, occipito-atlanto-axial region: Secondary | ICD-10-CM | POA: Diagnosis not present

## 2023-05-11 DIAGNOSIS — G959 Disease of spinal cord, unspecified: Secondary | ICD-10-CM | POA: Diagnosis not present

## 2023-05-11 DIAGNOSIS — M436 Torticollis: Secondary | ICD-10-CM | POA: Diagnosis not present

## 2023-05-11 DIAGNOSIS — M4801 Spinal stenosis, occipito-atlanto-axial region: Secondary | ICD-10-CM | POA: Diagnosis not present

## 2023-05-12 ENCOUNTER — Other Ambulatory Visit: Payer: Self-pay | Admitting: Internal Medicine

## 2023-05-12 MED ORDER — PROCHLORPERAZINE MALEATE 5 MG PO TABS: ORAL_TABLET | ORAL | 0 refills | Status: DC

## 2023-05-15 DIAGNOSIS — E119 Type 2 diabetes mellitus without complications: Secondary | ICD-10-CM | POA: Diagnosis not present

## 2023-05-15 DIAGNOSIS — Z7984 Long term (current) use of oral hypoglycemic drugs: Secondary | ICD-10-CM | POA: Diagnosis not present

## 2023-05-15 DIAGNOSIS — H40013 Open angle with borderline findings, low risk, bilateral: Secondary | ICD-10-CM | POA: Diagnosis not present

## 2023-05-15 LAB — HM DIABETES EYE EXAM

## 2023-05-18 DIAGNOSIS — Z6825 Body mass index (BMI) 25.0-25.9, adult: Secondary | ICD-10-CM | POA: Diagnosis not present

## 2023-05-18 DIAGNOSIS — G959 Disease of spinal cord, unspecified: Secondary | ICD-10-CM | POA: Diagnosis not present

## 2023-05-21 ENCOUNTER — Other Ambulatory Visit: Payer: Self-pay | Admitting: Nurse Practitioner

## 2023-05-21 DIAGNOSIS — G459 Transient cerebral ischemic attack, unspecified: Secondary | ICD-10-CM

## 2023-05-23 ENCOUNTER — Telehealth: Payer: Self-pay | Admitting: *Deleted

## 2023-05-23 DIAGNOSIS — Z23 Encounter for immunization: Secondary | ICD-10-CM | POA: Diagnosis not present

## 2023-05-23 NOTE — Telephone Encounter (Signed)
   Name: Joshua Higgins  DOB: [birth year]  MRN: [id number]  Primary Cardiologist: Thurmon Fair, MD   Preoperative team, please contact this patient and set up a phone call appointment for further preoperative risk assessment. Please obtain consent and complete medication review. Thank you for your help.  I confirm that guidance regarding antiplatelet and oral anticoagulation therapy has been completed and, if necessary, noted below.  Patient is on Plavix for history of TIA and guidance for holding should come from neurology.  I also confirmed the patient resides in the state of [place]. As per [facility] [employer] telemedicine laws, the patient must reside in the state in which the provider is licensed.   [place]apoleon Form, Leodis Rains, NP 05/23/2023, 10:40 AM [facility]

## 2023-05-23 NOTE — Telephone Encounter (Signed)
Left message to call back to set up tele pre op appt.  

## 2023-05-23 NOTE — Telephone Encounter (Signed)
   Pre-operative Risk Assessment    Patient Name: Joshua Higgins  DOB: May 17, 1946 MRN: [id number]      Request for Surgical Clearance    Procedure:   Post cervical LIMI/Multi Level Future C1-C2 pins/prone.  Date of Surgery:  Clearance TBD                                 Surgeon:  Dr. Barnett Abu Surgeon's Group or Practice Name:  [facility] Phone number:  [phone] Fax number:  [phone]   Type of Clearance Requested:   - Medical  - Pharmacy:  Hold Clopidogrel (Plavix) Not Indicated.   Type of Anesthesia:  General    Additional requests/questions:    Signed, Emmit Pomfret   05/23/2023, 10:28 AM

## 2023-05-24 ENCOUNTER — Encounter: Payer: Self-pay | Admitting: Internal Medicine

## 2023-05-25 ENCOUNTER — Telehealth: Payer: Self-pay | Admitting: *Deleted

## 2023-05-25 NOTE — Telephone Encounter (Signed)
Pt agreeable to tele preop appt 06/07/23. Med rec and consent are done.

## 2023-05-25 NOTE — Telephone Encounter (Signed)
Pt agreeable to tele preop appt 06/07/23. Med rec and consent are done.      Patient Consent for Virtual Visit        Joshua Higgins has provided verbal consent on 05/25/2023 for a virtual visit (video or telephone).   CONSENT FOR VIRTUAL VISIT FOR:  Joshua Higgins  By participating in this virtual visit I agree to the following:  I hereby voluntarily request, consent and authorize [facility] and its employed or contracted physicians, physician assistants, nurse practitioners or other licensed health care professionals (the Practitioner), to provide me with telemedicine health care services (the "Services") as deemed necessary by the treating Practitioner. I acknowledge and consent to receive the Services by the Practitioner via telemedicine. I understand that the telemedicine visit will involve communicating with the Practitioner through live audiovisual communication technology and the disclosure of certain medical information by electronic transmission. I acknowledge that I have been given the opportunity to request an in-person assessment or other available alternative prior to the telemedicine visit and am voluntarily participating in the telemedicine visit.  I understand that I have the right to withhold or withdraw my consent to the use of telemedicine in the course of my care at any time, without affecting my right to future care or treatment, and that the Practitioner or I may terminate the telemedicine visit at any time. I understand that I have the right to inspect all information obtained and/or recorded in the course of the telemedicine visit and may receive copies of available information for a reasonable fee.  I understand that some of the potential risks of receiving the Services via telemedicine include:  Delay or interruption in medical evaluation due to technological equipment failure or disruption; Information transmitted may not be sufficient (e.g. poor  resolution of images) to allow for appropriate medical decision making by the Practitioner; and/or  In rare instances, security protocols could fail, causing a breach of personal health information.  Furthermore, I acknowledge that it is my responsibility to provide information about my medical history, conditions and care that is complete and accurate to the best of my ability. I acknowledge that Practitioner's advice, recommendations, and/or decision may be based on factors not within their control, such as incomplete or inaccurate data provided by me or distortions of diagnostic images or specimens that may result from electronic transmissions. I understand that the practice of medicine is not an exact science and that Practitioner makes no warranties or guarantees regarding treatment outcomes. I acknowledge that a copy of this consent can be made available to me via my patient portal [facility] MyChart), or I can request a printed copy by calling the office of [facility].    I understand that my insurance will be billed for this visit.   I have read or had this consent read to me. I understand the contents of this consent, which adequately explains the benefits and risks of the Services being provided via telemedicine.  I have been provided ample opportunity to ask questions regarding this consent and the Services and have had my questions answered to my satisfaction. I give my informed consent for the services to be provided through the use of telemedicine in my medical care

## 2023-06-07 ENCOUNTER — Ambulatory Visit: Payer: Medicare Other | Attending: Cardiovascular Disease

## 2023-06-07 DIAGNOSIS — Z01810 Encounter for preprocedural cardiovascular examination: Secondary | ICD-10-CM

## 2023-06-07 NOTE — Progress Notes (Signed)
Virtual Visit via Telephone Note   Because of Joshua Higgins's co-morbid illnesses, he is at least at moderate risk for complications without adequate follow up.  This format is felt to be most appropriate for this patient at this time.  The patient did not have access to video technology/had technical difficulties with video requiring transitioning to audio format only (telephone).  All issues noted in this document were discussed and addressed.  No physical exam could be performed with this format.  Please refer to the patient's chart for his consent to telehealth for [facility].  Evaluation Performed:  Preoperative cardiovascular risk assessment _____________   Date:  06/07/2023   Patient ID:  Higgins, Joshua [birth year], MRN [id number] Patient Location:  Home Provider location:   Office  Primary Care Provider:  [name], MD Primary Cardiologist:  Thurmon Fair, MD  Chief Complaint / Patient Profile   77 y.o. y/o male with a h/o hypertension, coronary artery disease, TIA who is pending post cervical LIMI/Multi Level Future C1-C2 pins/prone and presents today for telephonic preoperative cardiovascular risk assessment.  History of Present Illness    Joshua Higgins is a 77 y.o. male who presents via audio/video conferencing for a telehealth visit today.  Pt was last seen in cardiology clinic on 03/08/2023 by Dr. Royann Shivers.  At that time [name] was doing well .  The patient is now pending procedure as outlined above. Since his last visit, he continues to be stable from a cardiac standpoint.  Today he denies chest pain, shortness of breath, lower extremity edema, fatigue, palpitations, melena, hematuria, hemoptysis, diaphoresis, weakness, presyncope, syncope, orthopnea, and PND.    Past Medical History    Past Medical History:  Diagnosis Date   Anemia    low iron   Arthritis    Cancer (HCC)    skin cancer on face   Cataracts, bilateral     Coronary artery disease    CABG - 2008   Diabetes mellitus without complication (HCC)    type II   GERD (gastroesophageal reflux disease)    Hepatitis    Hyperlipidemia    Hypertension    pt states he does not have HTN, takes Atenolol for migraines   Hypothyroidism    Migraine headache with aura    takes Atenolol   Sleep apnea    does not use cpap   Past Surgical History:  Procedure Laterality Date   BACK SURGERY     CARDIAC SURGERY     Bypass   CATARACT EXTRACTION, BILATERAL Bilateral 2018   Dr. Nile Riggs   COLONOSCOPY     CORONARY ANGIOPLASTY  2001   CORONARY ARTERY BYPASS GRAFT     ELBOW SURGERY Left    due to damaged nerve   KNEE SURGERY Right    arthroscopic   LUMBAR LAMINECTOMY/DECOMPRESSION MICRODISCECTOMY Right 08/04/2014   Procedure: Right Lumbar four-five Microdiskectomy;  Surgeon: Barnett Abu, MD;  Location: [facility];  Service: Neurosurgery;  Laterality: Right;  Right L4-5 Microdiskectomy   SURGERY SCROTAL / TESTICULAR     as a child/teenager    Allergies  Allergies  Allergen Reactions   Gemfibrozil Other (See Comments)    Muscle aches   Ace Inhibitors Cough   Lipitor [Atorvastatin] Other (See Comments)    Joint pain.    Home Medications    Prior to Admission medications   Medication Sig Start Date End Date Taking? Authorizing Provider  atenolol (TENORMIN) 50 MG tablet Take  1 tablet (50 mg total) by mouth daily. 01/18/23 01/18/24  Raynelle Dick, NP  Blood Glucose Monitoring Suppl DEVI Use kit to check blood sugar once a day 09/03/18   Judd Gaudier, NP  Cholecalciferol 5000 UNITS TABS Take 1 tablet by mouth at bedtime.    [provider]  clopidogrel (PLAVIX) 75 MG tablet TAKE 1 TABLET BY MOUTH EVERY DAY 05/22/23   Adela Glimpse, NP  Cyanocobalamin (B-12) 1000 MCG SUBL Place 1 tablet under the tongue once a week.    [provider]  ezetimibe (ZETIA) 10 MG tablet Take 1 tablet (10 mg total) by mouth daily. 07/20/22   Croitoru,  Mihai, MD  famotidine (PEPCID) 20 MG tablet TAKE 1 TABLET BY MOUTH TWICE A DAY Patient taking differently: Take 20 mg by mouth daily as needed for indigestion. 05/12/22   Raynelle Dick, NP  glucose blood (CONTOUR NEXT TEST) test strip USE TO TEST BLOOD SUGAR ONCE DAILY 03/09/23   Raynelle Dick, NP  Lancets MISC Use to test blood sugar once daily 03/10/22   Adela Glimpse, NP  levothyroxine (SYNTHROID) 50 MCG tablet TAKE 1 TABLET BY MOUTH DAILY ON AN EMPTY STOMACH WITH ONLY WATER FOR 30 MINUTES & NO ANTACID MEDS, CALCIUM OR MAGNESIUM FOR 4 HOURS & AVOID BIOTIN 05/22/23   Adela Glimpse, NP  metFORMIN (GLUCOPHAGE-XR) 500 MG 24 hr tablet TAKE 2 TABLETS BY MOUTH 2 TIMES PER DAY WITH MEALS FOR DIABETES 04/12/23   Raynelle Dick, NP  Multiple Vitamin (MULTIVITAMIN) tablet Take 1 tablet by mouth at bedtime.     [provider]  Omega-3 Fatty Acids (FISH OIL) 1000 MG CAPS Take 1,000 mg by mouth at bedtime.     [provider]  prochlorperazine (COMPAZINE) 5 MG tablet Take 1  tablet 3 x / day before meals for Dizziness, Vertigo or Nausea 05/12/23   [name], MD  rosuvastatin (CRESTOR) 10 MG tablet Take 1 tablet (10 mg total) by mouth See admin instructions. Take one tablet by mouth twice a week Sundays and Wednesdays 01/23/23   Raynelle Dick, NP    Physical Exam    Vital Signs:  PIYUSH SALAIZ does not have vital signs available for review today.  Given telephonic nature of communication, physical exam is limited. AAOx3. NAD. Normal affect.  Speech and respirations are unlabored.  Accessory Clinical Findings    None  Assessment & Plan    1.  Preoperative Cardiovascular Risk Assessment: Post cervical LIMI/Multi Level Future C1-C2 pins/prone, Dr. Barnett Abu, [facility] Phone number:  [phone] Fax number:  [phone]  Primary Cardiologist: Thurmon Fair, MD  Chart reviewed as part of pre-operative protocol coverage. Given past  medical history and time since last visit, based on ACC/AHA guidelines, Joshua PUZIO would be at acceptable risk for the planned procedure without further cardiovascular testing.   His RCRI is moderate risk, 6.6% risk of major cardiac event.  He is able to complete greater than 4 METS of physical activity.  Patient was advised that if he develops new symptoms prior to surgery to contact our office to arrange a follow-up appointment.  He verbalized understanding.  Patient is on Plavix for history of TIA and guidance for holding should come from neurology.   I will route this recommendation to the requesting party via Epic fax function and remove from pre-op pool.  Please call with questions.       Time:   Today, I have spent 5  minutes with the patient with telehealth technology discussing medical history, symptoms, and management plan.     Ronney Asters, NP  06/07/2023, 7:19 AM   Prior to patient's phone evaluation I spent greater than 10 minutes reviewing their past medical history and cardiac medications.

## 2023-06-12 ENCOUNTER — Telehealth: Payer: Self-pay | Admitting: Cardiovascular Disease

## 2023-06-12 NOTE — Telephone Encounter (Signed)
Angie with [facility] is calling to follow up on patient's clearance. Advised I see he was scheduled for a tele-visit on 12/04, but do not see the patient followed through with the appointment. Please advise.

## 2023-06-13 NOTE — Telephone Encounter (Signed)
S/w Angie.  Clearly the telephone clearance was in  pt's chart. Faxed via Epic to Dr. Verlee Rossetti office.

## 2023-06-19 ENCOUNTER — Other Ambulatory Visit: Payer: Self-pay | Admitting: Internal Medicine

## 2023-06-19 ENCOUNTER — Other Ambulatory Visit: Payer: Self-pay

## 2023-06-19 DIAGNOSIS — I10 Essential (primary) hypertension: Secondary | ICD-10-CM

## 2023-06-19 DIAGNOSIS — G43101 Migraine with aura, not intractable, with status migrainosus: Secondary | ICD-10-CM

## 2023-06-19 MED ORDER — ATENOLOL 50 MG PO TABS: ORAL_TABLET | ORAL | 3 refills | Status: DC

## 2023-06-21 DIAGNOSIS — Z7189 Other specified counseling: Secondary | ICD-10-CM | POA: Diagnosis not present

## 2023-06-21 DIAGNOSIS — L570 Actinic keratosis: Secondary | ICD-10-CM | POA: Diagnosis not present

## 2023-06-21 DIAGNOSIS — Z85828 Personal history of other malignant neoplasm of skin: Secondary | ICD-10-CM | POA: Diagnosis not present

## 2023-06-21 DIAGNOSIS — L821 Other seborrheic keratosis: Secondary | ICD-10-CM | POA: Diagnosis not present

## 2023-06-21 DIAGNOSIS — Z08 Encounter for follow-up examination after completed treatment for malignant neoplasm: Secondary | ICD-10-CM | POA: Diagnosis not present

## 2023-07-05 HISTORY — PX: NECK SURGERY: SHX720

## 2023-07-11 ENCOUNTER — Ambulatory Visit (INDEPENDENT_AMBULATORY_CARE_PROVIDER_SITE_OTHER): Payer: Medicare Other | Admitting: Nurse Practitioner

## 2023-07-11 ENCOUNTER — Encounter: Payer: Self-pay | Admitting: Nurse Practitioner

## 2023-07-11 VITALS — BP 138/64 | HR 72 | Temp 97.5°F | Ht 70.5 in | Wt 179.0 lb

## 2023-07-11 DIAGNOSIS — I10 Essential (primary) hypertension: Secondary | ICD-10-CM | POA: Diagnosis not present

## 2023-07-11 DIAGNOSIS — W19XXXA Unspecified fall, initial encounter: Secondary | ICD-10-CM

## 2023-07-11 NOTE — Progress Notes (Signed)
 Assessment and Plan:   Dickey was seen today for fall.  Diagnoses and all orders for this visit:  Essential hypertension - continue medications- Atenolol  50 mg every day , DASH diet, exercise and monitor at home. Call if greater than 130/80.    Fall, initial encounter Normal neuro and ortho exam today Will monitor Update Dr. Colon to see if can get surgery date for cervical myelopathy If develops dizziness or neuro changes or chest pain/breathing issues, back pain- go to ER     Further disposition pending results of labs. Discussed med's effects and SE's.   Over 30 minutes of exam, counseling, chart review, and critical decision making was performed.   Future Appointments  Date Time Provider Department Center  08/17/2023  2:30 PM Tonita Fallow, MD [facility] None  01/23/2024 11:30 AM Jude Lonell BRAVO, NP [facility] None  05/15/2024 11:00 AM Tonita Fallow, MD [facility] None    ------------------------------------------------------------------------------------------------------------------   HPI BP 138/64   Pulse 72   Temp (!) 97.5 F (36.4 C)   Ht 5' 10.5 (1.791 m)   Wt 179 lb (81.2 kg)   SpO2 94%   BMI 25.32 kg/m  78 y.o.male presents for evaluation of chest/head after fall 4 days ago. Was carrying 2 12 packs of drinks and fell, does not remember the fall and wife was not present.   Top of head is tender and some chest pain that feels like pulled muscle. Uncertain if he was unconscious. Clemens backwards and was having some shortness of breath like he knocked the wind out of him. He does have occasional blurred vision in right eye   BP well controlled with Atenolol  50 mg every day BP Readings from Last 3 Encounters:  07/11/23 138/64  05/08/23 130/70  04/10/23 134/70  Denies headaches, chest pain, shortness of breath and dizziness   BMI is Body mass index is 25.32 kg/m., he has not been working on diet and exercise. Wt Readings from Last 3 Encounters:   07/11/23 179 lb (81.2 kg)  05/08/23 174 lb (78.9 kg)  04/10/23 173 lb 12.8 oz (78.8 kg)     Past Medical History:  Diagnosis Date   Anemia    low iron   Arthritis    Cancer (HCC)    skin cancer on face   Cataracts, bilateral    Coronary artery disease    CABG - 2008   Diabetes mellitus without complication (HCC)    type II   GERD (gastroesophageal reflux disease)    Hepatitis    Hyperlipidemia    Hypertension    pt states he does not have HTN, takes Atenolol  for migraines   Hypothyroidism    Migraine headache with aura    takes Atenolol    Sleep apnea    does not use cpap     Allergies  Allergen Reactions   Gemfibrozil  Other (See Comments)    Muscle aches   Ace Inhibitors Cough   Lipitor [Atorvastatin] Other (See Comments)    Joint pain.    Current Outpatient Medications on File Prior to Visit  Medication Sig   atenolol  (TENORMIN ) 50 MG tablet Take  1 tablet  Daily for BP                                /  TAKE                                         BY                                                 MOUTH   Blood Glucose Monitoring Suppl DEVI Use kit to check blood sugar once a day   Cholecalciferol  5000 UNITS TABS Take 1 tablet by mouth at bedtime.   clopidogrel  (PLAVIX ) 75 MG tablet TAKE 1 TABLET BY MOUTH EVERY DAY   Cyanocobalamin  (B-12) 1000 MCG SUBL Place 1 tablet under the tongue once a week.   ezetimibe  (ZETIA ) 10 MG tablet Take 1 tablet (10 mg total) by mouth daily.   famotidine  (PEPCID ) 20 MG tablet TAKE 1 TABLET BY MOUTH TWICE A DAY (Patient taking differently: Take 20 mg by mouth daily as needed for indigestion.)   glucose blood (CONTOUR NEXT TEST) test strip USE TO TEST BLOOD SUGAR ONCE DAILY   Lancets MISC Use to test blood sugar once daily   levothyroxine  (SYNTHROID ) 50 MCG tablet TAKE 1 TABLET BY MOUTH DAILY ON AN EMPTY STOMACH WITH ONLY WATER FOR 30 MINUTES & NO ANTACID MEDS, CALCIUM  OR  MAGNESIUM FOR 4 HOURS & AVOID BIOTIN   metFORMIN  (GLUCOPHAGE -XR) 500 MG 24 hr tablet TAKE 2 TABLETS BY MOUTH 2 TIMES PER DAY WITH MEALS FOR DIABETES   Multiple Vitamin (MULTIVITAMIN) tablet Take 1 tablet by mouth at bedtime.    Omega-3 Fatty Acids (FISH OIL ) 1000 MG CAPS Take 1,000 mg by mouth at bedtime.    rosuvastatin  (CRESTOR ) 10 MG tablet Take 1 tablet (10 mg total) by mouth See admin instructions. Take one tablet by mouth twice a week Sundays and Wednesdays   prochlorperazine  (COMPAZINE ) 5 MG tablet Take 1  tablet 3 x / day before meals for Dizziness, Vertigo or Nausea (Patient not taking: Reported on 07/11/2023)   No current facility-administered medications on file prior to visit.    ROS: all negative except above.   Physical Exam:  BP 138/64   Pulse 72   Temp (!) 97.5 F (36.4 C)   Ht 5' 10.5 (1.791 m)   Wt 179 lb (81.2 kg)   SpO2 94%   BMI 25.32 kg/m   General Appearance: Well nourished, in no apparent distress. Eyes: PERRLA, EOMs, conjunctiva no swelling or erythem Neck: Supple, thyroid  normal.  Respiratory: Respiratory effort normal, BS equal bilaterally without rales, rhonchi, wheezing or stridor.  Cardio: RRR with no MRGs. Brisk peripheral pulses without edema.  Abdomen: Soft, + BS.  Non tender, no guarding, rebound, hernias, masses. Lymphatics: Non tender without lymphadenopathy.  Musculoskeletal: Full ROM, 5/5 strength, slow steadygait.  Skin: Warm, dry without rashes, lesions, ecchymosis.  Neuro: Cranial nerves intact. Normal muscle tone, no cerebellar symptoms. Sensation intact.  Psych: Awake and oriented X 3, normal affect, Insight and Judgment appropriate.     Karma Hiney E Aniyah Nobis, NP 2:16 PM [facility]

## 2023-07-11 NOTE — Patient Instructions (Signed)
 Understanding Your Risk for Falls Millions of people have serious injuries from falls each year. It is important to understand your risk of falling. Talk with your health care provider about your risk and what you can do to lower it. If you do have a serious fall, make sure to tell your provider. Falling once raises your risk of falling again. How can falls affect me? Serious injuries from falls are common. These include: Broken bones, such as hip fractures. Head injuries, such as traumatic brain injuries (TBI) or concussions. A fear of falling can cause you to avoid activities and stay at home. This can make your muscles weaker and raise your risk for a fall. What can increase my risk? There are a number of risk factors that increase your risk for falling. The more risk factors you have, the higher your risk of falling. Serious injuries from a fall happen most often to people who are older than 80 years old. Teenagers and young adults ages 57-29 are also at higher risk. Common risk factors include: Weakness in the lower body. Being generally weak or confused due to long-term (chronic) illness. Dizziness or balance problems. Poor vision. Medicines that cause dizziness or drowsiness. These may include: Medicines for your blood pressure, heart, anxiety, insomnia, or swelling (edema). Pain medicines. Muscle relaxants. Other risk factors include: Drinking alcohol. Having had a fall in the past. Having foot pain or wearing improper footwear. Working at a dangerous job. Having any of the following in your home: Tripping hazards, such as floor clutter or loose rugs. Poor lighting. Pets. Having dementia or memory loss. What actions can I take to lower my risk of falling?     Physical activity Stay physically fit. Do strength and balance exercises. Consider taking a regular class to build strength and balance. Yoga and tai chi are good options. Vision Have your eyes checked every year and  your prescription for glasses or contacts updated as needed. Shoes and walking aids Wear non-skid shoes. Wear shoes that have rubber soles and low heels. Do not wear high heels. Do not walk around the house in socks or slippers. Use a cane or walker as told by your provider. Home safety Attach secure railings on both sides of your stairs. Install grab bars for your bathtub, shower, and toilet. Use a non-skid mat in your bathtub or shower. Attach bath mats securely with double-sided, non-slip rug tape. Use good lighting in all rooms. Keep a flashlight near your bed. Make sure there is a clear path from your bed to the bathroom. Use night-lights. Do not use throw rugs. Make sure all carpeting is taped or tacked down securely. Remove all clutter from walkways and stairways, including extension cords. Repair uneven or broken steps and floors. Avoid walking on icy or slippery surfaces. Walk on the grass instead of on icy or slick sidewalks. Use ice melter to get rid of ice on walkways in the winter. Use a cordless phone. Questions to ask your health care provider Can you help me check my risk for a fall? Do any of my medicines make me more likely to fall? Should I take a vitamin D supplement? What exercises can I do to improve my strength and balance? Should I make an appointment to have my vision checked? Do I need a bone density test to check for weak bones (osteoporosis)? Would it help to use a cane or a walker? Where to find more information Centers for Disease Control and Prevention, STEADI: TonerPromos.no  Community-Based Fall Prevention Programs: TonerPromos.no [employer] on Aging: BaseRingTones.pl Contact a health care provider if: You fall at home. You are afraid of falling at home. You feel weak, drowsy, or dizzy. This information is not intended to replace advice given to you by your health care provider. Make sure you discuss any questions you have with your health care  provider. Document Revised: 02/21/2022 Document Reviewed: 02/21/2022 Elsevier Patient Education  2024 [employer].

## 2023-07-12 ENCOUNTER — Ambulatory Visit: Payer: Medicare Other | Admitting: Nurse Practitioner

## 2023-07-12 ENCOUNTER — Other Ambulatory Visit: Payer: Self-pay

## 2023-07-12 ENCOUNTER — Other Ambulatory Visit: Payer: Self-pay | Admitting: Neurological Surgery

## 2023-07-12 DIAGNOSIS — Z1211 Encounter for screening for malignant neoplasm of colon: Secondary | ICD-10-CM | POA: Diagnosis not present

## 2023-07-12 DIAGNOSIS — Z1212 Encounter for screening for malignant neoplasm of rectum: Secondary | ICD-10-CM | POA: Diagnosis not present

## 2023-07-12 LAB — POC HEMOCCULT BLD/STL (HOME/3-CARD/SCREEN)
Card #2 Fecal Occult Blod, POC: NEGATIVE
Card #3 Fecal Occult Blood, POC: NEGATIVE
Fecal Occult Blood, POC: NEGATIVE

## 2023-07-26 ENCOUNTER — Other Ambulatory Visit: Payer: Self-pay | Admitting: Neurological Surgery

## 2023-07-26 NOTE — Pre-Procedure Instructions (Signed)
Surgical Instructions   Your procedure is scheduled on August 03, 2023. Report to [facility] Main Entrance "A" at 10:20 A.M., then check in with the Admitting office. Any questions or running late day of surgery: call [phone]  Questions prior to your surgery date: call [phone], Monday-Friday, 8am-4pm. If you experience any cold or flu symptoms such as cough, fever, chills, shortness of breath, etc. between now and your scheduled surgery, please notify us at the above number.     Remember:  Do not eat or drink after midnight the night before your surgery    Take these medicines the morning of surgery with A SIP OF WATER: atenolol (TENORMIN)  ezetimibe (ZETIA)  levothyroxine (SYNTHROID)    May take these medicines IF NEEDED: famotidine (PEPCID)    Follow your surgeon's instructions on when to stop clopidogrel (PLAVIX).  If no instructions were given by your surgeon then you will need to call the office to get those instructions.     One week prior to surgery, STOP taking any Aspirin (unless otherwise instructed by your surgeon) Aleve, Naproxen, Ibuprofen, Motrin, Advil, Goody's, BC's, all herbal medications, fish oil, and non-prescription vitamins.   WHAT DO I DO ABOUT MY DIABETES MEDICATION?   Do not take metFORMIN (GLUCOPHAGE-XR) the morning of surgery.   HOW TO MANAGE YOUR DIABETES BEFORE AND AFTER SURGERY  Why is it important to control my blood sugar before and after surgery? Improving blood sugar levels before and after surgery helps healing and can limit problems. A way of improving blood sugar control is eating a healthy diet by:  Eating less sugar and carbohydrates  Increasing activity/exercise  Talking with your doctor about reaching your blood sugar goals High blood sugars (greater than 180 mg/dL) can raise your risk of infections and slow your recovery, so you will need to focus on controlling your diabetes during the weeks before surgery. Make sure  that the doctor who takes care of your diabetes knows about your planned surgery including the date and location.  How do I manage my blood sugar before surgery? Check your blood sugar at least 4 times a day, starting 2 days before surgery, to make sure that the level is not too high or low.  Check your blood sugar the morning of your surgery when you wake up and every 2 hours until you get to the Short Stay unit.  If your blood sugar is less than 70 mg/dL, you will need to treat for low blood sugar: Do not take insulin. Treat a low blood sugar (less than 70 mg/dL) with  cup of clear juice (cranberry or apple), 4 glucose tablets, OR glucose gel. Recheck blood sugar in 15 minutes after treatment (to make sure it is greater than 70 mg/dL). If your blood sugar is not greater than 70 mg/dL on recheck, call [phone] for further instructions. Report your blood sugar to the short stay nurse when you get to Short Stay.  If you are admitted to the hospital after surgery: Your blood sugar will be checked by the staff and you will probably be given insulin after surgery (instead of oral diabetes medicines) to make sure you have good blood sugar levels. The goal for blood sugar control after surgery is 80-180 mg/dL.                      Do NOT Smoke (Tobacco/Vaping) for 24 hours prior to your procedure.  If you use a CPAP at night, you  may bring your mask/headgear for your overnight stay.   You will be asked to remove any contacts, glasses, piercing's, hearing aid's, dentures/partials prior to surgery. Please bring cases for these items if needed.    Patients discharged the day of surgery will not be allowed to drive home, and someone needs to stay with them for 24 hours.  SURGICAL WAITING ROOM VISITATION Patients may have no more than 2 support people in the waiting area - these visitors may rotate.   Pre-op nurse will coordinate an appropriate time for 1 ADULT support person, who may not  rotate, to accompany patient in pre-op.  Children under the age of 70 must have an adult with them who is not the patient and must remain in the main waiting area with an adult.  If the patient needs to stay at the hospital during part of their recovery, the visitor guidelines for inpatient rooms apply.  Please refer to the [facility] website for the visitor guidelines for any additional information.   If you received a COVID test during your pre-op visit  it is requested that you wear a mask when out in public, stay away from anyone that may not be feeling well and notify your surgeon if you develop symptoms. If you have been in contact with anyone that has tested positive in the last 10 days please notify you surgeon.      Pre-operative 5 CHG Bathing Instructions   You can play a key role in reducing the risk of infection after surgery. Your skin needs to be as free of germs as possible. You can reduce the number of germs on your skin by washing with CHG (chlorhexidine gluconate) soap before surgery. CHG is an antiseptic soap that kills germs and continues to kill germs even after washing.   DO NOT use if you have an allergy to chlorhexidine/CHG or antibacterial soaps. If your skin becomes reddened or irritated, stop using the CHG and notify one of our RNs at [phone].   Please shower with the CHG soap starting 4 days before surgery using the following schedule:     Please keep in mind the following:  DO NOT shave, including legs and underarms, starting the day of your first shower.   You may shave your face at any point before/day of surgery.  Place clean sheets on your bed the day you start using CHG soap. Use a clean washcloth (not used since being washed) for each shower. DO NOT sleep with pets once you start using the CHG.   CHG Shower Instructions:  Wash your face and private area with normal soap. If you choose to wash your hair, wash first with your normal shampoo.   After you use shampoo/soap, rinse your hair and body thoroughly to remove shampoo/soap residue.  Turn the water OFF and apply about 3 tablespoons (45 ml) of CHG soap to a CLEAN washcloth.  Apply CHG soap ONLY FROM YOUR NECK DOWN TO YOUR TOES (washing for 3-5 minutes)  DO NOT use CHG soap on face, private areas, open wounds, or sores.  Pay special attention to the area where your surgery is being performed.  If you are having back surgery, having someone wash your back for you may be helpful. Wait 2 minutes after CHG soap is applied, then you may rinse off the CHG soap.  Pat dry with a clean towel  Put on clean clothes/pajamas   If you choose to wear lotion, please use ONLY the CHG-compatible  lotions that are listed below.  Additional instructions for the day of surgery: DO NOT APPLY any lotions, deodorants, cologne, or perfumes.   Do not bring valuables to the hospital. [facility] is not responsible for any belongings/valuables. Do not wear nail polish, gel polish, artificial nails, or any other type of covering on natural nails (fingers and toes) Do not wear jewelry or makeup Put on clean/comfortable clothes.  Please brush your teeth.  Ask your nurse before applying any prescription medications to the skin.     CHG Compatible Lotions   Aveeno Moisturizing lotion  Cetaphil Moisturizing Cream  Cetaphil Moisturizing Lotion  Clairol Herbal Essence Moisturizing Lotion, Dry Skin  Clairol Herbal Essence Moisturizing Lotion, Extra Dry Skin  Clairol Herbal Essence Moisturizing Lotion, Normal Skin  Curel Age Defying Therapeutic Moisturizing Lotion with Alpha Hydroxy  Curel Extreme Care Body Lotion  Curel Soothing Hands Moisturizing Hand Lotion  Curel Therapeutic Moisturizing Cream, Fragrance-Free  Curel Therapeutic Moisturizing Lotion, Fragrance-Free  Curel Therapeutic Moisturizing Lotion, Original Formula  Eucerin Daily Replenishing Lotion  Eucerin Dry Skin Therapy Plus Alpha Hydroxy  Crme  Eucerin Dry Skin Therapy Plus Alpha Hydroxy Lotion  Eucerin Original Crme  Eucerin Original Lotion  Eucerin Plus Crme Eucerin Plus Lotion  Eucerin TriLipid Replenishing Lotion  Keri Anti-Bacterial Hand Lotion  Keri Deep Conditioning Original Lotion Dry Skin Formula Softly Scented  Keri Deep Conditioning Original Lotion, Fragrance Free Sensitive Skin Formula  Keri Lotion Fast Absorbing Fragrance Free Sensitive Skin Formula  Keri Lotion Fast Absorbing Softly Scented Dry Skin Formula  Keri Original Lotion  Keri Skin Renewal Lotion Keri Silky Smooth Lotion  Keri Silky Smooth Sensitive Skin Lotion  Nivea Body Creamy Conditioning Oil  Nivea Body Extra Enriched Lotion  Nivea Body Original Lotion  Nivea Body Sheer Moisturizing Lotion Nivea Crme  Nivea Skin Firming Lotion  NutraDerm 30 Skin Lotion  NutraDerm Skin Lotion  NutraDerm Therapeutic Skin Cream  NutraDerm Therapeutic Skin Lotion  ProShield Protective Hand Cream  Provon moisturizing lotion  Please read over the following fact sheets that you were given.

## 2023-07-27 ENCOUNTER — Encounter (HOSPITAL_COMMUNITY)
Admission: RE | Admit: 2023-07-27 | Discharge: 2023-07-27 | Disposition: A | Discharge status: Home / Self Care | Payer: Medicare Other | Source: Ambulatory Visit | Attending: Neurological Surgery | Admitting: Neurological Surgery

## 2023-07-27 ENCOUNTER — Other Ambulatory Visit: Payer: Self-pay

## 2023-07-27 ENCOUNTER — Encounter (HOSPITAL_COMMUNITY): Payer: Self-pay

## 2023-07-27 VITALS — BP 139/72 | HR 62 | Temp 98.0°F | Resp 12 | Ht 70.0 in | Wt 175.8 lb

## 2023-07-27 DIAGNOSIS — Z01812 Encounter for preprocedural laboratory examination: Secondary | ICD-10-CM | POA: Diagnosis not present

## 2023-07-27 DIAGNOSIS — Z01818 Encounter for other preprocedural examination: Secondary | ICD-10-CM

## 2023-07-27 DIAGNOSIS — E119 Type 2 diabetes mellitus without complications: Secondary | ICD-10-CM | POA: Insufficient documentation

## 2023-07-27 DIAGNOSIS — I10 Essential (primary) hypertension: Secondary | ICD-10-CM | POA: Insufficient documentation

## 2023-07-27 DIAGNOSIS — K219 Gastro-esophageal reflux disease without esophagitis: Secondary | ICD-10-CM

## 2023-07-27 LAB — COMPREHENSIVE METABOLIC PANEL
ALT: 17 U/L (ref 0–44)
AST: 21 U/L (ref 15–41)
Albumin: 4.2 g/dL (ref 3.5–5.0)
Alkaline Phosphatase: 56 U/L (ref 38–126)
Anion gap: 10 (ref 5–15)
BUN: 14 mg/dL (ref 8–23)
CO2: 28 mmol/L (ref 22–32)
Calcium: 10 mg/dL (ref 8.9–10.3)
Chloride: 102 mmol/L (ref 98–111)
Creatinine, Ser: 1.19 mg/dL (ref 0.61–1.24)
GFR, Estimated: >60 mL/min (ref >60)
Glucose, Bld: 137 mg/dL — ABNORMAL HIGH (ref 70–99)
Potassium: 5.8 mmol/L — ABNORMAL HIGH (ref 3.5–5.1)
Sodium: 140 mmol/L (ref 135–145)
Total Bilirubin: 0.9 mg/dL (ref 0.0–1.2)
Total Protein: 7.3 g/dL (ref 6.5–8.1)

## 2023-07-27 LAB — CBC
HCT: 41.5 % (ref 39.0–52.0)
Hemoglobin: 13.5 g/dL (ref 13.0–17.0)
MCH: 34 pg (ref 26.0–34.0)
MCHC: 32.5 g/dL (ref 30.0–36.0)
MCV: 104.5 fL — ABNORMAL HIGH (ref 80.0–100.0)
Platelets: 186 10*3/uL (ref 150–400)
RBC: 3.97 MIL/uL — ABNORMAL LOW (ref 4.22–5.81)
RDW: 12.4 % (ref 11.5–15.5)
WBC: 6 10*3/uL (ref 4.0–10.5)
nRBC: 0 % (ref 0.0–0.2)

## 2023-07-27 LAB — SURGICAL PCR SCREEN
MRSA, PCR: NEGATIVE
Staphylococcus aureus: NEGATIVE

## 2023-07-27 LAB — HEMOGLOBIN A1C
Hgb A1c MFr Bld: 6.2 % — ABNORMAL HIGH (ref 4.8–5.6)
Mean Plasma Glucose: 131.24 mg/dL

## 2023-07-27 LAB — GLUCOSE, CAPILLARY: Glucose-Capillary: 135 mg/dL — ABNORMAL HIGH (ref 70–99)

## 2023-07-27 NOTE — Progress Notes (Signed)
PCP - [name], MD Cardiologist - Thurmon Fair, MD  PPM/ICD - Denies  Chest x-ray - 03/16/2023 EKG - 05/08/2023 Stress Test - 04/20/2023 ECHO - 07/19/2022 Cardiac Cath - 05/16/2007  OSA positive CPAP - Denies using CPAP at night.  DM: Type II Fasting Blood Sugar: 100-115 Checks Blood Sugar 3-4 times a week.  Blood Thinner Instructions: Per cardiologist, hold plavix 5 days prior to surgery. Last dose on 07/28/2023 Aspirin Instructions: N/A  ERAS Protcol - NPO PRE-SURGERY Ensure or G2- N/A  COVID TEST- N/A   Anesthesia review: Yes, cardiac history.  Patient denies shortness of breath, fever, cough and chest pain at PAT appointment   All instructions explained to the patient, with a verbal understanding of the material. Patient agrees to go over the instructions while at home for a better understanding.The opportunity to ask questions was provided.

## 2023-07-28 ENCOUNTER — Telehealth: Payer: Self-pay | Admitting: Cardiovascular Disease

## 2023-07-28 NOTE — Telephone Encounter (Signed)
Patient aware Surgeon office.notifed

## 2023-07-28 NOTE — Telephone Encounter (Signed)
Follow Up:      Patient was calling to make sure that he was cleared for surgery and Dr C was aware of his surgery.

## 2023-07-28 NOTE — Progress Notes (Signed)
Anesthesia Chart Review:  78 year old male follows with cardiology for history of hypertension, TIA, CAD s/p CABG x 3 in 2008.  Last seen by Dr. Royann Shivers 03/08/2023.  Per note, he had a near normal PET scan in October 2023 (moderate ischemia in a small portion of the basal inferior wall; EF 55% at rest, 61% stress).  Incidental findings of a patulous esophagus and aortic atherosclerosis on the CT report and as well as multilevel degenerative changes in the spine.  Overall stable from cardiac standpoint, no changes to management, yearly follow-up recommended.  Cardiac clearance per progress note by Edd Fabian, NP on 06/07/2023, "Chart reviewed as part of pre-operative protocol coverage. Given past medical history and time since last visit, based on ACC/AHA guidelines, AMAHRI DENGEL would be at acceptable risk for the planned procedure without further cardiovascular testing. His RCRI is moderate risk, 6.6% risk of major cardiac event.  He is able to complete greater than 4 METS of physical activity."  Patient reports he was instructed to hold Plavix 5 days prior to surgery, last dose 07/28/2023.  He was briefly hospitalized January 2024 for a transient ischemic attack manifesting primarily as expressive aphasia.  Workup was unremarkable.  He wore a 30-day monitor that did not show evidence of atrial fibrillation.  His echocardiogram was normal.  CT angiogram of the head and neck did show atherosclerotic calcifications of the aortic arch and arch vessels, but less than 50% stenoses.  There was no evidence of acute stroke, but there was evidence of "extensive chronic small vessel ischemic disease" with ASPECTS score of 10.   Other pertinent history includes non-insulin-dependent DM2, OSA not on CPAP, hypothyroid, GERD, migraine headaches.  Per surgery posting, patient has cervical myelopathy.  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  Review of prior blood work available in epic shows patient have some fluctuations in potassium with previous elevations over the past 2 years of 5.6, 5.4, 5.7.  He is not on any potassium supplementation.  Will recheck i-STAT on day of surgery.  EKG 05/08/2023:.  NSR.  Rate 66.  TTE 07/19/2022: 1. Left ventricular ejection fraction, by estimation, is 55 to 60%. The  left ventricle has normal function. The left ventricle has no regional  wall motion abnormalities. There is mild concentric left ventricular  hypertrophy. Left ventricular diastolic  parameters are consistent with Grade I diastolic dysfunction (impaired  relaxation).   2. Right ventricular systolic function is normal. The right ventricular  size is normal.   3. The mitral valve is degenerative. No evidence of mitral valve  regurgitation.   4. The aortic valve is tricuspid. There is mild calcification of the  aortic valve. There is mild thickening of the aortic valve. Aortic valve  regurgitation is not visualized. Aortic valve sclerosis/calcification is  present, without any evidence of  aortic stenosis.   5. The inferior vena cava is normal in size with greater than 50%  respiratory variability, suggesting right atrial pressure of 3 mmHg.   Cardiac PET 04/19/2022:   The study is grossly normal. The study is low risk.   LV perfusion is mildly abnormal. There is evidence of a small amount of ischemia. There is no evidence of infarction. Defect 1: There is a small, 1 segment defect with moderate reduction in uptake present in the basal inferior location(s) that is reversible. There is abnormal wall motion in the  defect area. Consistent with ischemia.   Rest left ventricular function is normal. Rest EF: 55 %. Stress left ventricular function is normal. Stress EF: 61 %. End diastolic cavity size is normal. End systolic cavity size is  normal. No evidence of transient ischemic dilation (TID) noted.   Myocardial blood flow reserve is not reported in this patient due to technical or patient-specific concerns that affect accuracy: prior revascularization.   Coronary calcium assessment not performed due to prior revascularization.   Electronically signed by: Parke Poisson, MD     Antionette Poles, PA-C [facility] Short Stay Center/Anesthesiology Phone [phone] 07/28/2023 3:10 PM

## 2023-07-28 NOTE — Anesthesia Preprocedure Evaluation (Signed)
Anesthesia Evaluation  Patient identified by MRN, date of birth, ID band Patient awake    Reviewed: Allergy & Precautions, NPO status , Patient's Chart, lab work & pertinent test results  Airway Mallampati: II  TM Distance: >3 FB Neck ROM: Full    Dental no notable dental hx.    Pulmonary sleep apnea , former smoker   Pulmonary exam normal breath sounds clear to auscultation       Cardiovascular hypertension, + CAD, + Cardiac Stents and + CABG  Normal cardiovascular exam Rhythm:Regular Rate:Normal     Neuro/Psych  Headaches TIA   GI/Hepatic Neg liver ROS,GERD  ,,  Endo/Other  diabetes, Type 2, Oral Hypoglycemic AgentsHypothyroidism    Renal/GU Renal InsufficiencyRenal disease     Musculoskeletal  (+) Arthritis , Osteoarthritis,    Abdominal   Peds  Hematology negative hematology ROS (+)   Anesthesia Other Findings   Reproductive/Obstetrics                             Anesthesia Physical Anesthesia Plan  ASA: III  Anesthesia Plan: General   Post-op Pain Management: Dilaudid IV   Induction: Intravenous  PONV Risk Score and Plan: 2 and Ondansetron and Midazolam  Airway Management Planned: Oral ETT  Additional Equipment:   Intra-op Plan:   Post-operative Plan: Extubation in OR  Informed Consent: I have reviewed the patients History and Physical, chart, labs and discussed the procedure including the risks, benefits and alternatives for the proposed anesthesia with the patient or authorized representative who has indicated his/her understanding and acceptance.     Dental advisory given  Plan Discussed with: CRNA  Anesthesia Plan Comments: (PAT note by Antionette Poles, PA-C:  78 year old male follows with cardiology for history of hypertension, TIA, CAD s/p CABG x 3 in 2008.  Last seen by Dr. Royann Shivers 03/08/2023.  Per note, he had a near normal PET scan in October 2023 (moderate  ischemia in a small portion of the basal inferior wall; EF 55% at rest, 61% stress).  Incidental findings of a patulous esophagus and aortic atherosclerosis on the CT report and as well as multilevel degenerative changes in the spine.  Overall stable from cardiac standpoint, no changes to management, yearly follow-up recommended.  Cardiac clearance per progress note by Edd Fabian, NP on 06/07/2023, "Chart reviewed as part of pre-operative protocol coverage. Given past medical history and time since last visit, based on ACC/AHA guidelines, Joshua Higgins would be at acceptable risk for the planned procedure without further cardiovascular testing. His RCRI is moderate risk, 6.6% risk of major cardiac event.  He is able to complete greater than 4 METS of physical activity."  Patient reports he was instructed to hold Plavix 5 days prior to surgery, last dose 07/28/2023.  He was briefly hospitalized January 2024 for a transient ischemic attack manifesting primarily as expressive aphasia.  Workup was unremarkable.  He wore a 30-day monitor that did not show evidence of atrial fibrillation.  His echocardiogram was normal.  CT angiogram of the head and neck did show atherosclerotic calcifications of the aortic arch and arch vessels, but less than 50% stenoses.  There was no evidence of acute stroke, but there was evidence of "extensive chronic small vessel ischemic disease" with ASPECTS score of 10.   Other pertinent history includes non-insulin-dependent DM2, OSA not on CPAP, hypothyroid, GERD, migraine headaches.  Per surgery posting, patient has cervical myelopathy.  CT head 03/16/2023 showed, "Foramen  magnum stenosis and cervicomedullary impingement due to advanced atlantodental degeneration."  Preop labs reviewed, moderate hyperkalemia potassium 5.8, otherwise unremarkable.  Review of prior blood work available in epic shows patient have some fluctuations in potassium with previous elevations over the past  2 years of 5.6, 5.4, 5.7.  He is not on any potassium supplementation.  Will recheck i-STAT on day of surgery.  EKG 05/08/2023:.  NSR.  Rate 66.  TTE 07/19/2022: 1. Left ventricular ejection fraction, by estimation, is 55 to 60%. The  left ventricle has normal function. The left ventricle has no regional  wall motion abnormalities. There is mild concentric left ventricular  hypertrophy. Left ventricular diastolic  parameters are consistent with Grade I diastolic dysfunction (impaired  relaxation).  2. Right ventricular systolic function is normal. The right ventricular  size is normal.  3. The mitral valve is degenerative. No evidence of mitral valve  regurgitation.  4. The aortic valve is tricuspid. There is mild calcification of the  aortic valve. There is mild thickening of the aortic valve. Aortic valve  regurgitation is not visualized. Aortic valve sclerosis/calcification is  present, without any evidence of  aortic stenosis.  5. The inferior vena cava is normal in size with greater than 50%  respiratory variability, suggesting right atrial pressure of 3 mmHg.   Cardiac PET 04/19/2022:   The study is grossly normal. The study is low risk.   LV perfusion is mildly abnormal. There is evidence of a small amount of ischemia. There is no evidence of infarction. Defect 1: There is a small, 1 segment defect with moderate reduction in uptake present in the basal inferior location(s) that is reversible. There is abnormal wall motion in the defect area. Consistent with ischemia.   Rest left ventricular function is normal. Rest EF: 55 %. Stress left ventricular function is normal. Stress EF: 61 %. End diastolic cavity size is normal. End systolic cavity size is normal. No evidence of transient ischemic dilation (TID) noted.   Myocardial blood flow reserve is not reported in this patient due to technical or patient-specific concerns that affect accuracy: prior revascularization.    Coronary calcium assessment not performed due to prior revascularization.   Electronically signed by: Parke Poisson, MD   )        Anesthesia Quick Evaluation

## 2023-07-31 ENCOUNTER — Other Ambulatory Visit: Payer: Self-pay

## 2023-07-31 MED ORDER — EZETIMIBE 10 MG PO TABS: 10.0000 mg | ORAL_TABLET | Freq: Every day | ORAL | 2 refills | Status: DC

## 2023-08-03 ENCOUNTER — Encounter (HOSPITAL_COMMUNITY): Payer: Self-pay | Admitting: Neurological Surgery

## 2023-08-03 ENCOUNTER — Observation Stay (HOSPITAL_COMMUNITY)
Admission: RE | Admit: 2023-08-03 | Discharge: 2023-08-04 | Disposition: A | Discharge status: Home / Self Care | Payer: Medicare Other | Attending: Neurological Surgery | Admitting: Neurological Surgery

## 2023-08-03 ENCOUNTER — Ambulatory Visit (HOSPITAL_COMMUNITY): Payer: Medicare Other

## 2023-08-03 ENCOUNTER — Encounter (HOSPITAL_COMMUNITY)
Admission: RE | Disposition: A | Discharge status: Home / Self Care | Payer: Self-pay | Source: Home / Self Care | Attending: Neurological Surgery

## 2023-08-03 ENCOUNTER — Ambulatory Visit (HOSPITAL_BASED_OUTPATIENT_CLINIC_OR_DEPARTMENT_OTHER): Payer: Medicare Other | Admitting: Physician Assistant

## 2023-08-03 ENCOUNTER — Other Ambulatory Visit: Payer: Self-pay

## 2023-08-03 ENCOUNTER — Ambulatory Visit (HOSPITAL_COMMUNITY): Payer: Self-pay | Admitting: Physician Assistant

## 2023-08-03 DIAGNOSIS — Z8673 Personal history of transient ischemic attack (TIA), and cerebral infarction without residual deficits: Secondary | ICD-10-CM | POA: Insufficient documentation

## 2023-08-03 DIAGNOSIS — Z87891 Personal history of nicotine dependence: Secondary | ICD-10-CM | POA: Diagnosis not present

## 2023-08-03 DIAGNOSIS — M4722 Other spondylosis with radiculopathy, cervical region: Secondary | ICD-10-CM | POA: Diagnosis not present

## 2023-08-03 DIAGNOSIS — I2510 Atherosclerotic heart disease of native coronary artery without angina pectoris: Secondary | ICD-10-CM | POA: Insufficient documentation

## 2023-08-03 DIAGNOSIS — E039 Hypothyroidism, unspecified: Secondary | ICD-10-CM | POA: Diagnosis not present

## 2023-08-03 DIAGNOSIS — E1129 Type 2 diabetes mellitus with other diabetic kidney complication: Principal | ICD-10-CM

## 2023-08-03 DIAGNOSIS — M4802 Spinal stenosis, cervical region: Secondary | ICD-10-CM | POA: Diagnosis not present

## 2023-08-03 DIAGNOSIS — E1122 Type 2 diabetes mellitus with diabetic chronic kidney disease: Secondary | ICD-10-CM | POA: Diagnosis not present

## 2023-08-03 DIAGNOSIS — G992 Myelopathy in diseases classified elsewhere: Secondary | ICD-10-CM

## 2023-08-03 DIAGNOSIS — Z951 Presence of aortocoronary bypass graft: Secondary | ICD-10-CM | POA: Diagnosis not present

## 2023-08-03 DIAGNOSIS — I129 Hypertensive chronic kidney disease with stage 1 through stage 4 chronic kidney disease, or unspecified chronic kidney disease: Secondary | ICD-10-CM | POA: Diagnosis not present

## 2023-08-03 DIAGNOSIS — I10 Essential (primary) hypertension: Secondary | ICD-10-CM | POA: Diagnosis not present

## 2023-08-03 DIAGNOSIS — M4801 Spinal stenosis, occipito-atlanto-axial region: Secondary | ICD-10-CM | POA: Diagnosis not present

## 2023-08-03 DIAGNOSIS — Z85828 Personal history of other malignant neoplasm of skin: Secondary | ICD-10-CM | POA: Diagnosis not present

## 2023-08-03 DIAGNOSIS — E119 Type 2 diabetes mellitus without complications: Secondary | ICD-10-CM

## 2023-08-03 DIAGNOSIS — G959 Disease of spinal cord, unspecified: Secondary | ICD-10-CM | POA: Diagnosis present

## 2023-08-03 DIAGNOSIS — N182 Chronic kidney disease, stage 2 (mild): Secondary | ICD-10-CM | POA: Diagnosis not present

## 2023-08-03 HISTORY — PX: POSTERIOR CERVICAL LAMINECTOMY: SHX2248

## 2023-08-03 LAB — GLUCOSE, CAPILLARY
Glucose-Capillary: 105 mg/dL — ABNORMAL HIGH (ref 70–99)
Glucose-Capillary: 141 mg/dL — ABNORMAL HIGH (ref 70–99)
Glucose-Capillary: 240 mg/dL — ABNORMAL HIGH (ref 70–99)

## 2023-08-03 LAB — POCT I-STAT, CHEM 8
BUN: 18 mg/dL (ref 8–23)
Calcium, Ion: 1.17 mmol/L (ref 1.15–1.40)
Chloride: 103 mmol/L (ref 98–111)
Creatinine, Ser: 1.2 mg/dL (ref 0.61–1.24)
Glucose, Bld: 105 mg/dL — ABNORMAL HIGH (ref 70–99)
HCT: 38 % — ABNORMAL LOW (ref 39.0–52.0)
Hemoglobin: 12.9 g/dL — ABNORMAL LOW (ref 13.0–17.0)
Potassium: 4.2 mmol/L (ref 3.5–5.1)
Sodium: 140 mmol/L (ref 135–145)
TCO2: 27 mmol/L (ref 22–32)

## 2023-08-03 SURGERY — POSTERIOR CERVICAL LAMINECTOMY
Anesthesia: General | Site: Spine Cervical

## 2023-08-03 MED ORDER — OXYCODONE HCL 5 MG PO TABS
ORAL_TABLET | ORAL | Status: AC
  Filled 2023-08-03: qty 1

## 2023-08-03 MED ORDER — SODIUM CHLORIDE 0.9% FLUSH
3.0000 mL | Freq: Two times a day (BID) | INTRAVENOUS | Status: DC
  Administered 2023-08-03: 3 mL via INTRAVENOUS

## 2023-08-03 MED ORDER — OXYCODONE-ACETAMINOPHEN 5-325 MG PO TABS: 1.0000 | ORAL_TABLET | Freq: Four times a day (QID) | ORAL | Status: DC | PRN

## 2023-08-03 MED ORDER — SODIUM CHLORIDE 0.9% FLUSH
3.0000 mL | Freq: Two times a day (BID) | INTRAVENOUS | Status: DC
Start: 2023-08-03 — End: 2023-08-03

## 2023-08-03 MED ORDER — SODIUM CHLORIDE 0.9% FLUSH: 3.0000 mL | INTRAVENOUS | Status: DC | PRN

## 2023-08-03 MED ORDER — LACTATED RINGERS IV SOLN: INTRAVENOUS | Status: DC | PRN

## 2023-08-03 MED ORDER — INSULIN ASPART 100 UNIT/ML IJ SOLN
0.0000 [IU] | Freq: Every day | INTRAMUSCULAR | Status: DC
  Administered 2023-08-03: 2 [IU] via SUBCUTANEOUS

## 2023-08-03 MED ORDER — CEFAZOLIN SODIUM-DEXTROSE 2-4 GM/100ML-% IV SOLN
2.0000 g | Freq: Three times a day (TID) | INTRAVENOUS | Status: AC
  Administered 2023-08-03 – 2023-08-04 (×2): 2 g via INTRAVENOUS
  Filled 2023-08-03 (×2): qty 100

## 2023-08-03 MED ORDER — LIDOCAINE-EPINEPHRINE 1 %-1:100000 IJ SOLN
INTRAMUSCULAR | Status: AC
  Filled 2023-08-03: qty 1

## 2023-08-03 MED ORDER — BISACODYL 10 MG RE SUPP: 10.0000 mg | Freq: Every day | RECTAL | Status: DC | PRN

## 2023-08-03 MED ORDER — SODIUM CHLORIDE 0.9 % IV SOLN
250.0000 mL | INTRAVENOUS | Status: DC
  Administered 2023-08-03: 250 mL via INTRAVENOUS

## 2023-08-03 MED ORDER — OXYCODONE HCL 5 MG/5ML PO SOLN: 5.0000 mg | Freq: Once | ORAL | Status: AC | PRN

## 2023-08-03 MED ORDER — BACITRACIN ZINC 500 UNIT/GM EX OINT
TOPICAL_OINTMENT | CUTANEOUS | Status: DC | PRN
  Administered 2023-08-03: 1 via TOPICAL

## 2023-08-03 MED ORDER — FLEET ENEMA RE ENEM: 1.0000 | ENEMA | Freq: Once | RECTAL | Status: DC | PRN

## 2023-08-03 MED ORDER — CHLORHEXIDINE GLUCONATE CLOTH 2 % EX PADS: 6.0000 | MEDICATED_PAD | Freq: Once | CUTANEOUS | Status: DC

## 2023-08-03 MED ORDER — PROPOFOL 10 MG/ML IV BOLUS
INTRAVENOUS | Status: AC
  Filled 2023-08-03: qty 20

## 2023-08-03 MED ORDER — EPHEDRINE SULFATE-NACL 50-0.9 MG/10ML-% IV SOSY
PREFILLED_SYRINGE | INTRAVENOUS | Status: DC | PRN
  Administered 2023-08-03 (×4): 5 mg via INTRAVENOUS

## 2023-08-03 MED ORDER — SUGAMMADEX SODIUM 200 MG/2ML IV SOLN
INTRAVENOUS | Status: DC | PRN
  Administered 2023-08-03: 300 mg via INTRAVENOUS

## 2023-08-03 MED ORDER — BACITRACIN ZINC 500 UNIT/GM EX OINT
TOPICAL_OINTMENT | CUTANEOUS | Status: AC
  Filled 2023-08-03: qty 28.35

## 2023-08-03 MED ORDER — LEVOTHYROXINE SODIUM 50 MCG PO TABS
50.0000 ug | ORAL_TABLET | Freq: Every day | ORAL | Status: DC
  Administered 2023-08-04: 50 ug via ORAL
  Filled 2023-08-03: qty 1
  Filled 2023-08-03: qty 2

## 2023-08-03 MED ORDER — SODIUM CHLORIDE 0.9 % IV SOLN: 12.5000 mg | INTRAVENOUS | Status: DC | PRN

## 2023-08-03 MED ORDER — SENNA 8.6 MG PO TABS
1.0000 | ORAL_TABLET | Freq: Two times a day (BID) | ORAL | Status: DC
  Administered 2023-08-03 – 2023-08-04 (×3): 8.6 mg via ORAL
  Filled 2023-08-03 (×3): qty 1

## 2023-08-03 MED ORDER — 0.9 % SODIUM CHLORIDE (POUR BTL) OPTIME
TOPICAL | Status: DC | PRN
  Administered 2023-08-03: 1000 mL

## 2023-08-03 MED ORDER — ROCURONIUM BROMIDE 10 MG/ML (PF) SYRINGE
PREFILLED_SYRINGE | INTRAVENOUS | Status: DC | PRN
  Administered 2023-08-03: 80 mg via INTRAVENOUS
  Administered 2023-08-03: 20 mg via INTRAVENOUS

## 2023-08-03 MED ORDER — FAMOTIDINE 20 MG PO TABS: 20.0000 mg | ORAL_TABLET | Freq: Every day | ORAL | Status: DC | PRN

## 2023-08-03 MED ORDER — POLYETHYLENE GLYCOL 3350 17 G PO PACK: 17.0000 g | PACK | Freq: Every day | ORAL | Status: DC | PRN

## 2023-08-03 MED ORDER — LIDOCAINE 2% (20 MG/ML) 5 ML SYRINGE
INTRAMUSCULAR | Status: AC
  Filled 2023-08-03: qty 5

## 2023-08-03 MED ORDER — INSULIN ASPART 100 UNIT/ML IJ SOLN
0.0000 [IU] | Freq: Three times a day (TID) | INTRAMUSCULAR | Status: DC
Start: 2023-08-04 — End: 2023-08-04

## 2023-08-03 MED ORDER — ONDANSETRON HCL 4 MG/2ML IJ SOLN
INTRAMUSCULAR | Status: AC
  Filled 2023-08-03: qty 2

## 2023-08-03 MED ORDER — HYDROMORPHONE HCL 1 MG/ML IJ SOLN: 0.2500 mg | INTRAMUSCULAR | Status: DC | PRN

## 2023-08-03 MED ORDER — METHOCARBAMOL 1000 MG/10ML IJ SOLN: 500.0000 mg | Freq: Four times a day (QID) | INTRAMUSCULAR | Status: DC | PRN

## 2023-08-03 MED ORDER — ONDANSETRON HCL 4 MG/2ML IJ SOLN
INTRAMUSCULAR | Status: DC | PRN
  Administered 2023-08-03: 4 mg via INTRAVENOUS

## 2023-08-03 MED ORDER — ONDANSETRON HCL 4 MG/2ML IJ SOLN: 4.0000 mg | Freq: Four times a day (QID) | INTRAMUSCULAR | Status: DC | PRN

## 2023-08-03 MED ORDER — BUPIVACAINE HCL (PF) 0.5 % IJ SOLN
INTRAMUSCULAR | Status: DC | PRN
  Administered 2023-08-03: 20 mL

## 2023-08-03 MED ORDER — HYDROMORPHONE HCL 1 MG/ML IJ SOLN: 0.5000 mg | INTRAMUSCULAR | Status: DC | PRN

## 2023-08-03 MED ORDER — PHENOL 1.4 % MT LIQD
1.0000 | OROMUCOSAL | Status: DC | PRN
  Filled 2023-08-03: qty 177

## 2023-08-03 MED ORDER — KETOROLAC TROMETHAMINE 15 MG/ML IJ SOLN
7.5000 mg | Freq: Four times a day (QID) | INTRAMUSCULAR | Status: DC
  Administered 2023-08-03 – 2023-08-04 (×3): 7.5 mg via INTRAVENOUS
  Filled 2023-08-03 (×3): qty 1

## 2023-08-03 MED ORDER — SODIUM CHLORIDE 0.9% FLUSH
3.0000 mL | INTRAVENOUS | Status: DC | PRN
Start: 2023-08-03 — End: 2023-08-03

## 2023-08-03 MED ORDER — CEFAZOLIN SODIUM-DEXTROSE 2-4 GM/100ML-% IV SOLN
2.0000 g | INTRAVENOUS | Status: AC
  Administered 2023-08-03: 2 g via INTRAVENOUS
  Filled 2023-08-03: qty 100

## 2023-08-03 MED ORDER — FENTANYL CITRATE (PF) 250 MCG/5ML IJ SOLN
INTRAMUSCULAR | Status: DC | PRN
  Administered 2023-08-03: 100 ug via INTRAVENOUS

## 2023-08-03 MED ORDER — EPHEDRINE 5 MG/ML INJ
INTRAVENOUS | Status: AC
  Filled 2023-08-03: qty 5

## 2023-08-03 MED ORDER — ACETAMINOPHEN 325 MG PO TABS
650.0000 mg | ORAL_TABLET | ORAL | Status: DC | PRN
  Administered 2023-08-03 – 2023-08-04 (×2): 650 mg via ORAL
  Filled 2023-08-03 (×2): qty 2

## 2023-08-03 MED ORDER — OXYCODONE HCL 5 MG PO TABS
5.0000 mg | ORAL_TABLET | Freq: Once | ORAL | Status: AC | PRN
  Administered 2023-08-03: 5 mg via ORAL

## 2023-08-03 MED ORDER — BUPIVACAINE HCL (PF) 0.5 % IJ SOLN
INTRAMUSCULAR | Status: AC
  Filled 2023-08-03: qty 30

## 2023-08-03 MED ORDER — METFORMIN HCL ER 500 MG PO TB24
500.0000 mg | ORAL_TABLET | Freq: Two times a day (BID) | ORAL | Status: DC
  Administered 2023-08-03 – 2023-08-04 (×2): 500 mg via ORAL
  Filled 2023-08-03 (×2): qty 1

## 2023-08-03 MED ORDER — ACETAMINOPHEN 650 MG RE SUPP: 650.0000 mg | RECTAL | Status: DC | PRN

## 2023-08-03 MED ORDER — PROPOFOL 10 MG/ML IV BOLUS
INTRAVENOUS | Status: DC | PRN
  Administered 2023-08-03: 50 mg via INTRAVENOUS
  Administered 2023-08-03: 120 mg via INTRAVENOUS

## 2023-08-03 MED ORDER — THROMBIN 5000 UNITS EX SOLR
OROMUCOSAL | Status: DC | PRN
  Administered 2023-08-03: 5 mL via TOPICAL

## 2023-08-03 MED ORDER — ROCURONIUM BROMIDE 10 MG/ML (PF) SYRINGE
PREFILLED_SYRINGE | INTRAVENOUS | Status: AC
  Filled 2023-08-03: qty 10

## 2023-08-03 MED ORDER — CHLORHEXIDINE GLUCONATE 0.12 % MT SOLN
15.0000 mL | Freq: Once | OROMUCOSAL | Status: AC
  Administered 2023-08-03: 15 mL via OROMUCOSAL
  Filled 2023-08-03: qty 15

## 2023-08-03 MED ORDER — MENTHOL 3 MG MT LOZG: 1.0000 | LOZENGE | OROMUCOSAL | Status: DC | PRN

## 2023-08-03 MED ORDER — ROSUVASTATIN CALCIUM 5 MG PO TABS: 10.0000 mg | ORAL_TABLET | ORAL | Status: DC

## 2023-08-03 MED ORDER — EZETIMIBE 10 MG PO TABS
10.0000 mg | ORAL_TABLET | Freq: Every day | ORAL | Status: DC
  Administered 2023-08-04: 10 mg via ORAL
  Filled 2023-08-03: qty 1

## 2023-08-03 MED ORDER — ONDANSETRON HCL 4 MG PO TABS
4.0000 mg | ORAL_TABLET | Freq: Four times a day (QID) | ORAL | Status: DC | PRN
Start: 2023-08-03 — End: 2023-08-04
  Filled 2023-08-03: qty 1

## 2023-08-03 MED ORDER — THROMBIN 5000 UNITS EX SOLR
CUTANEOUS | Status: AC
  Filled 2023-08-03: qty 5000

## 2023-08-03 MED ORDER — ATENOLOL 50 MG PO TABS
50.0000 mg | ORAL_TABLET | Freq: Every day | ORAL | Status: DC
  Administered 2023-08-04: 50 mg via ORAL
  Filled 2023-08-03: qty 1

## 2023-08-03 MED ORDER — LIDOCAINE 2% (20 MG/ML) 5 ML SYRINGE
INTRAMUSCULAR | Status: DC | PRN
  Administered 2023-08-03: 80 mg via INTRAVENOUS

## 2023-08-03 MED ORDER — DEXAMETHASONE SODIUM PHOSPHATE 10 MG/ML IJ SOLN
INTRAMUSCULAR | Status: AC
  Filled 2023-08-03: qty 1

## 2023-08-03 MED ORDER — FENTANYL CITRATE (PF) 250 MCG/5ML IJ SOLN
INTRAMUSCULAR | Status: AC
  Filled 2023-08-03: qty 5

## 2023-08-03 MED ORDER — ORAL CARE MOUTH RINSE: 15.0000 mL | Freq: Once | OROMUCOSAL | Status: AC

## 2023-08-03 MED ORDER — MIDAZOLAM HCL 2 MG/2ML IJ SOLN
INTRAMUSCULAR | Status: DC | PRN
  Administered 2023-08-03 (×2): 1 mg via INTRAVENOUS

## 2023-08-03 MED ORDER — METHOCARBAMOL 500 MG PO TABS
500.0000 mg | ORAL_TABLET | Freq: Four times a day (QID) | ORAL | Status: DC | PRN
  Administered 2023-08-04: 500 mg via ORAL
  Filled 2023-08-03: qty 1

## 2023-08-03 MED ORDER — DEXAMETHASONE SODIUM PHOSPHATE 10 MG/ML IJ SOLN
INTRAMUSCULAR | Status: DC | PRN
  Administered 2023-08-03: 10 mg via INTRAVENOUS

## 2023-08-03 MED ORDER — DOCUSATE SODIUM 100 MG PO CAPS
100.0000 mg | ORAL_CAPSULE | Freq: Two times a day (BID) | ORAL | Status: DC
  Administered 2023-08-03 – 2023-08-04 (×3): 100 mg via ORAL
  Filled 2023-08-03 (×3): qty 1

## 2023-08-03 MED ORDER — MIDAZOLAM HCL 2 MG/2ML IJ SOLN
INTRAMUSCULAR | Status: AC
  Filled 2023-08-03: qty 2

## 2023-08-03 MED ORDER — AMISULPRIDE (ANTIEMETIC) 5 MG/2ML IV SOLN: 10.0000 mg | Freq: Once | INTRAVENOUS | Status: DC | PRN

## 2023-08-03 SURGICAL SUPPLY — 56 items
BAG COUNTER SPONGE SURGICOUNT (BAG) ×1 IMPLANT
BAND RUBBER #18 3X1/16 STRL (MISCELLANEOUS) ×2 IMPLANT
BENZOIN TINCTURE PRP APPL 2/3 (GAUZE/BANDAGES/DRESSINGS) IMPLANT
BIT DRILL NEURO 2X3.1 SFT TUCH (MISCELLANEOUS) ×1 IMPLANT
BLADE CLIPPER SURG (BLADE) IMPLANT
BLADE SURG 11 STRL SS (BLADE) ×1 IMPLANT
CANISTER SUCT 3000ML PPV (MISCELLANEOUS) ×1 IMPLANT
COVER MAYO STAND STRL (DRAPES) IMPLANT
DERMABOND ADVANCED .7 DNX12 (GAUZE/BANDAGES/DRESSINGS) IMPLANT
DEVICE DISSECT PLASMABLAD 3.0S (MISCELLANEOUS) ×1 IMPLANT
DRAPE LAPAROTOMY 100X72 PEDS (DRAPES) ×1 IMPLANT
DRILL NEURO 2X3.1 SOFT TOUCH (MISCELLANEOUS) ×1
DRSG OPSITE POSTOP 4X8 (GAUZE/BANDAGES/DRESSINGS) IMPLANT
DURAPREP 26ML APPLICATOR (WOUND CARE) ×1 IMPLANT
ELECT BLADE 6.5 EXT (BLADE) ×1 IMPLANT
ELECT REM PT RETURN 9FT ADLT (ELECTROSURGICAL) ×1
ELECTRODE REM PT RTRN 9FT ADLT (ELECTROSURGICAL) ×1 IMPLANT
GAUZE 4X4 16PLY ~~LOC~~+RFID DBL (SPONGE) IMPLANT
GAUZE SPONGE 4X4 12PLY STRL (GAUZE/BANDAGES/DRESSINGS) IMPLANT
GLOVE BIOGEL PI IND STRL 8.5 (GLOVE) ×1 IMPLANT
GLOVE ECLIPSE 8.5 STRL (GLOVE) ×1 IMPLANT
GLOVE EXAM NITRILE XL STR (GLOVE) IMPLANT
GOWN STRL REUS W/ TWL LRG LVL3 (GOWN DISPOSABLE) IMPLANT
GOWN STRL REUS W/ TWL XL LVL3 (GOWN DISPOSABLE) ×1 IMPLANT
GOWN STRL REUS W/TWL 2XL LVL3 (GOWN DISPOSABLE) ×1 IMPLANT
HEMOSTAT POWDER KIT SURGIFOAM (HEMOSTASIS) ×1 IMPLANT
HEMOSTAT SURGICEL 2X14 (HEMOSTASIS) ×1 IMPLANT
KIT BASIN OR (CUSTOM PROCEDURE TRAY) ×1 IMPLANT
KIT TURNOVER KIT B (KITS) ×1 IMPLANT
NDL HYPO 18GX1.5 BLUNT FILL (NEEDLE) IMPLANT
NDL HYPO 22X1.5 SAFETY MO (MISCELLANEOUS) ×1 IMPLANT
NEEDLE HYPO 18GX1.5 BLUNT FILL (NEEDLE)
NEEDLE HYPO 22X1.5 SAFETY MO (MISCELLANEOUS) ×1
NS IRRIG 1000ML POUR BTL (IV SOLUTION) ×1 IMPLANT
PACK LAMINECTOMY NEURO (CUSTOM PROCEDURE TRAY) ×1 IMPLANT
PAD ARMBOARD 7.5X6 YLW CONV (MISCELLANEOUS) ×3 IMPLANT
PATTIES SURGICAL .5 X1 (DISPOSABLE) IMPLANT
PIN MAYFIELD SKULL DISP (PIN) ×1 IMPLANT
PLASMABLADE 3.0S (MISCELLANEOUS) ×1
SPIKE FLUID TRANSFER (MISCELLANEOUS) ×1 IMPLANT
SPONGE SURGIFOAM ABS GEL SZ50 (HEMOSTASIS) IMPLANT
SPONGE T-LAP 4X18 ~~LOC~~+RFID (SPONGE) IMPLANT
STAPLER VISISTAT (STAPLE) ×1 IMPLANT
STRIP CLOSURE SKIN 1/2X4 (GAUZE/BANDAGES/DRESSINGS) IMPLANT
SUT ETHILON 3 0 FSL (SUTURE) IMPLANT
SUT VIC AB 1 CT1 18XBRD ANBCTR (SUTURE) ×1 IMPLANT
SUT VIC AB 2-0 CP2 18 (SUTURE) ×1 IMPLANT
SUT VIC AB 2-0 CT2 18 VCP726D (SUTURE) IMPLANT
SUT VIC AB 3-0 SH 8-18 (SUTURE) ×1 IMPLANT
SUT VIC AB 4-0 RB1 18 (SUTURE) ×1 IMPLANT
SYR 3ML LL SCALE MARK (SYRINGE) IMPLANT
TOWEL GREEN STERILE (TOWEL DISPOSABLE) ×1 IMPLANT
TOWEL GREEN STERILE FF (TOWEL DISPOSABLE) ×1 IMPLANT
TRAY FOLEY MTR SLVR 16FR STAT (SET/KITS/TRAYS/PACK) IMPLANT
TUBING FEATHERFLOW (TUBING) ×1 IMPLANT
WATER STERILE IRR 1000ML POUR (IV SOLUTION) ×1 IMPLANT

## 2023-08-03 NOTE — Plan of Care (Signed)
Problem: Education: Goal: Knowledge of General Education information will improve Description: Including pain rating scale, medication(s)/side effects and non-pharmacologic comfort measures Outcome: Progressing   Problem: Health Behavior/Discharge Planning: Goal: Ability to manage health-related needs will improve Outcome: Progressing   Problem: Clinical Measurements: Goal: Ability to maintain clinical measurements within normal limits will improve Outcome: Progressing Goal: Will remain free from infection Outcome: Progressing Goal: Diagnostic test results will improve Outcome: Progressing Goal: Respiratory complications will improve Outcome: Progressing Goal: Cardiovascular complication will be avoided Outcome: Progressing   Problem: Coping: Goal: Level of anxiety will decrease Outcome: Progressing   Problem: Elimination: Goal: Will not experience complications related to bowel motility Outcome: Progressing Goal: Will not experience complications related to urinary retention Outcome: Progressing   Problem: Skin Integrity: Goal: Risk for impaired skin integrity will decrease Outcome: Progressing

## 2023-08-03 NOTE — Anesthesia Procedure Notes (Signed)
Procedure Name: Intubation Date/Time: 08/03/2023 12:06 PM  Performed by: Georgianne Fick D, CRNAPre-anesthesia Checklist: Patient identified, Emergency Drugs available, Suction available and Patient being monitored Patient Re-evaluated:Patient Re-evaluated prior to induction Oxygen Delivery Method: Circle System Utilized Preoxygenation: Pre-oxygenation with 100% oxygen Induction Type: IV induction Ventilation: Mask ventilation without difficulty Laryngoscope Size: Miller and 3 Grade View: Grade I Tube type: Oral Tube size: 7.5 mm Number of attempts: 1 Airway Equipment and Method: Stylet and Oral airway Placement Confirmation: ETT inserted through vocal cords under direct vision, positive ETCO2 and breath sounds checked- equal and bilateral Secured at: 23 cm Tube secured with: Tape Dental Injury: Teeth and Oropharynx as per pre-operative assessment

## 2023-08-03 NOTE — Anesthesia Postprocedure Evaluation (Signed)
Anesthesia Post Note  Patient: Joshua Higgins  Procedure(s) Performed: Posterior Cervical Laminectomy and Decompresson Cervical One-Cervical Two (Spine Cervical)     Patient location during evaluation: PACU Anesthesia Type: General Level of consciousness: awake and alert Pain management: pain level controlled Vital Signs Assessment: post-procedure vital signs reviewed and stable Respiratory status: spontaneous breathing, nonlabored ventilation and respiratory function stable Cardiovascular status: blood pressure returned to baseline and stable Postop Assessment: no apparent nausea or vomiting Anesthetic complications: no   No notable events documented.  Last Vitals:  Vitals:   08/03/23 1515 08/03/23 1556  BP: (!) 142/71 (!) 149/72  Pulse: 75 73  Resp: 12 18  Temp: 36.4 C 36.5 C  SpO2: 95% 95%    Last Pain:  Vitals:   08/03/23 1556  TempSrc: Oral  PainSc:                  [name]

## 2023-08-03 NOTE — Progress Notes (Signed)
Orthopedic Tech Progress Note Patient Details:  TANNON PEERSON Jan 25, 1946 [id number]  Soft collar delivered to pt's room. Pt was eating and did not want it on at this time. The pt, his wife, and daughter at bedside stated it was for comfort per their conversations with Dr. Danielle Dess.  Ortho Devices Type of Ortho Device: Soft collar Ortho Device/Splint Location: at bedside Ortho Device/Splint Interventions: Ordered, Adjustment   Post Interventions Instructions Provided: Care of device, Adjustment of device  Anessa Charley Carmine Savoy 08/03/2023, 5:38 PM

## 2023-08-03 NOTE — Op Note (Signed)
Date of surgery: 08/03/2023 Preoperative diagnosis: Cervical myelopathy secondary to stenosis at the cervical occipital junction Postoperative diagnosis: Same Procedure: Posterior decompression of C1 and C2 via laminectomy. Surgeon: Barnett Abu First Assistant: Hoyt Koch, MD Anesthesia: General Tracheal Indications: Joshua Higgins is a 78 year old individual whose had significant episodes of transient intermittent paralysis of all 4 extremities.  He was found to have severe stenosis at the occipital junction secondary to a large pannus formation behind the ring of C1.  Because of the degree of stenosis that was present at the craniocervical junction I advised a posterior decompression without fusion.  No motion was noted to be abnormal at the cervical cranial junction. Procedure: Patient was brought to the operating room supine on the stretcher.  After the smooth induction of general endotracheal anesthesia he was carefully turned prone.  The head was placed in the 3 pin headrest and he was secured to the operating table with a clamp.  The patient was carefully positioned in the slightly flexed position.  The back of the neck was then prepped with alcohol DuraPrep and draped in a sterile fashion.  The shoulders were taped and failure lead to remove skin folds.  A midline incision was then created after infiltrating with 10 cc of lidocaine with epinephrine.  The dissection was carried down to the cervical dorsal fascia.  Carefully we exposed the posterior arch and the ring of C1 and the spinous process and laminar arches of C2.  With this then we dissected laterally cauterizing significant venous plexus in this region.  When adequate arch of the posterior portion of C1 was exposed a is a high-speed drill with 2 mm dissecting tool to cut through the lateral arch on 1 side and then the other and then en bloc I removed a portion of the central arch of C1.  The top of C2 was then dissected carefully and a  medial laminectomy was performed removing the anterior superior arch of C2.  Then using a 2 and 3 mm Kerrison punch I carefully undercut C2 to the inferior aspect of the lamina.  This provided more decompression posteriorly.  Once this was accomplished hemostasis in the epidural area was obtained with bipolar cautery some pledgets of Gelfoam soaked in thrombin.  These were later irrigated away when the decompression was felt to be adequate and then remove the retractors and reapproximated the cervical deep fascia with several 2-0 Vicryl in interrupted fashion 2-0 Vicryl was then used in the cervical dorsal fascia.  3-0 Vicryl was used in the subcutaneous tissues and 3-0 Vicryl was used to close subcuticular skin along with some 4-0 Vicryl.  Dermabond was placed on the skin.  Blood loss was estimated at 100 cc.  Patient was then removed from the 3 pin headrest turned supine and returned to recovery room in stable condition.  During this procedure Dr. Hoyt Koch helped by providing retraction and exposure while the laminectomy was being performed of the C1 arch.

## 2023-08-03 NOTE — H&P (Signed)
Joshua Higgins is an 78 y.o. male.   Chief Complaint: Episode of quadriplegia HPI: Patient is a 78 year old individual who had a singular episode of quadriplegia he was conscious and suddenly lost all use of his upper and lower extremities this was very transient and gradually came back with some numbness in his hands workup ensued including MRI of the brain to rule out a stroke none was noted however it was noted that he had stenosis at the craniocervical junction.  Follow-up study of the cervical spine demonstrates that the patient has a significant pannus at the occipital cervical junction from the posterior arch of C1 with calcification and thickening of the posterior alar ligament.  There is evidence of early cord compression but no significant cord signal change was noted however given this finding the patient was advised regarding surgical decompression of the posterior arch of C1 and undercutting of C2 in an effort to create more room for spinal cord at the craniocervical junction.  Past Medical History:  Diagnosis Date   Anemia    low iron   Arthritis    Cancer (HCC)    skin cancer on face   Cataracts, bilateral    Coronary artery disease    CABG - 2008   Diabetes mellitus without complication (HCC)    type II   GERD (gastroesophageal reflux disease)    Hepatitis    Hyperlipidemia    Hypertension    pt states he does not have HTN, takes Atenolol for migraines   Hypothyroidism    Migraine headache with aura    takes Atenolol   Sleep apnea    does not use cpap   TIA (transient ischemic attack) 07/2022    Past Surgical History:  Procedure Laterality Date   BACK SURGERY     CARDIAC SURGERY     Bypass   CATARACT EXTRACTION, BILATERAL Bilateral 2018   Dr. Nile Riggs   COLONOSCOPY     CORONARY ANGIOPLASTY  2001   CORONARY ARTERY BYPASS GRAFT     ELBOW SURGERY Left    due to damaged nerve   KNEE SURGERY Right    arthroscopic   LUMBAR LAMINECTOMY/DECOMPRESSION  MICRODISCECTOMY Right 08/04/2014   Procedure: Right Lumbar four-five Microdiskectomy;  Surgeon: Barnett Abu, MD;  Location: [facility];  Service: Neurosurgery;  Laterality: Right;  Right L4-5 Microdiskectomy   SURGERY SCROTAL / TESTICULAR     as a child/teenager    Family History  Problem Relation Age of Onset   Ulcers Mother    Arthritis Mother    Heart attack Father    CAD Sister    Heart attack Brother    CAD Brother    Hypertension Brother    Diabetes Maternal Grandmother    Social History:  reports that he quit smoking about 49 years ago. His smoking use included cigarettes. He has never used smokeless tobacco. He reports that he does not drink alcohol and does not use drugs.  Allergies:  Allergies  Allergen Reactions   Gemfibrozil Other (See Comments)    Muscle aches   Ace Inhibitors Cough   Lipitor [Atorvastatin] Other (See Comments)    Joint pain.    No medications prior to admission.    No results found for this or any previous visit (from the past 48 hours). No results found.  Review of Systems  Musculoskeletal:  Positive for neck pain.  All other systems reviewed and are negative.   There were no vitals taken for this visit.  Physical Exam Constitutional:      Appearance: Normal appearance. He is normal weight.  HENT:     Head: Normocephalic and atraumatic.     Right Ear: Tympanic membrane, ear canal and external ear normal.     Left Ear: Tympanic membrane, ear canal and external ear normal.     Nose: Nose normal.     Mouth/Throat:     Mouth: Mucous membranes are moist.     Pharynx: Oropharynx is clear.  Eyes:     Extraocular Movements: Extraocular movements intact.     Conjunctiva/sclera: Conjunctivae normal.     Pupils: Pupils are equal, round, and reactive to light.  Cardiovascular:     Rate and Rhythm: Normal rate and regular rhythm.  Pulmonary:     Effort: Pulmonary effort is normal.     Breath sounds: Normal breath sounds.  Abdominal:      General: Abdomen is flat.     Palpations: Abdomen is soft.  Musculoskeletal:        General: Normal range of motion.     Cervical back: Normal range of motion and neck supple.  Skin:    General: Skin is warm and dry.     Capillary Refill: Capillary refill takes less than 2 seconds.  Neurological:     General: No focal deficit present.     Mental Status: He is alert.  Psychiatric:        Mood and Affect: Mood normal.        Behavior: Behavior normal.        Thought Content: Thought content normal.        Judgment: Judgment normal.      Assessment/Plan Cervical stenosis at the craniocervical junction.  Plan: Posterior decompression of the arch of C1 and undercutting of C2.  Stefani Dama, MD 08/03/2023, 8:13 AM

## 2023-08-03 NOTE — Transfer of Care (Signed)
Immediate Anesthesia Transfer of Care Note  Patient: Joshua Higgins  Procedure(s) Performed: Posterior Cervical Laminectomy and Decompresson Cervical One-Cervical Two (Spine Cervical)  Patient Location: PACU  Anesthesia Type:General  Level of Consciousness: drowsy  Airway & Oxygen Therapy: Patient Spontanous Breathing and Patient connected to face mask oxygen  Post-op Assessment: Report given to RN and Post -op Vital signs reviewed and stable  Post vital signs: Reviewed and stable  Last Vitals:  Vitals Value Taken Time  BP    Temp    Pulse    Resp    SpO2      Last Pain:  Vitals:   08/03/23 1123  TempSrc:   PainSc: 0-No pain         Complications: No notable events documented.

## 2023-08-04 ENCOUNTER — Encounter (HOSPITAL_COMMUNITY): Payer: Self-pay | Admitting: Neurological Surgery

## 2023-08-04 DIAGNOSIS — M4722 Other spondylosis with radiculopathy, cervical region: Secondary | ICD-10-CM | POA: Diagnosis not present

## 2023-08-04 DIAGNOSIS — E039 Hypothyroidism, unspecified: Secondary | ICD-10-CM | POA: Diagnosis not present

## 2023-08-04 DIAGNOSIS — Z85828 Personal history of other malignant neoplasm of skin: Secondary | ICD-10-CM | POA: Diagnosis not present

## 2023-08-04 DIAGNOSIS — I2510 Atherosclerotic heart disease of native coronary artery without angina pectoris: Secondary | ICD-10-CM | POA: Diagnosis not present

## 2023-08-04 DIAGNOSIS — M4802 Spinal stenosis, cervical region: Secondary | ICD-10-CM | POA: Diagnosis not present

## 2023-08-04 DIAGNOSIS — Z951 Presence of aortocoronary bypass graft: Secondary | ICD-10-CM | POA: Diagnosis not present

## 2023-08-04 LAB — GLUCOSE, CAPILLARY: Glucose-Capillary: 115 mg/dL — ABNORMAL HIGH (ref 70–99)

## 2023-08-04 MED ORDER — TRAMADOL HCL 50 MG PO TABS: 50.0000 mg | ORAL_TABLET | Freq: Four times a day (QID) | ORAL | 0 refills | Status: DC | PRN

## 2023-08-04 MED ORDER — METHOCARBAMOL 500 MG PO TABS: 500.0000 mg | ORAL_TABLET | Freq: Four times a day (QID) | ORAL | 2 refills | Status: DC | PRN

## 2023-08-04 NOTE — Progress Notes (Signed)
Patient alert and oriented, void, ambulate. Surgical site clean and dry no sign of infection . D/c instructions explain and given, all questions answered. Patient d/c home per order.

## 2023-08-04 NOTE — Evaluation (Signed)
Occupational Therapy Evaluation Patient Details Name: Joshua Higgins MRN: [id number] DOB: [birth year] Today's Date: 08/04/2023   History of Present Illness 78 yo male s/p posterior decompression of C1-C2 on 1/30. PMH includes anemia, OA, CAD s/p CABG 2008, HTN, [name], DMII, OSA not on CPAP, TIA, lumbar laminectomy 2016.   Clinical Impression   Patient admitted for the diagnosis above.  PTA he lives with his spouse, who can provide needed supportive assist.  Patient does need occasional safety cues, but is essentially at his baseline for ADL completion and in room mobility.  Patient verbalized understanding of precautions, all questions answered and no further OT needs in the acute setting.  Recommend follow up as prescribed by MD.         If plan is discharge home, recommend the following: Assist for transportation    Functional Status Assessment  Patient has not had a recent decline in their functional status  Equipment Recommendations  None recommended by OT    Recommendations for Other Services       Precautions / Restrictions Precautions Precautions: Cervical Precaution Booklet Issued: Yes (comment) Required Braces or Orthoses: Cervical Brace Cervical Brace: Soft collar;For comfort Restrictions Weight Bearing Restrictions Per Provider Order: No      Mobility Bed Mobility                    Transfers Overall transfer level: Modified independent Equipment used: None                      Balance Overall balance assessment: Mild deficits observed, not formally tested                                         ADL either performed or assessed with clinical judgement   ADL Overall ADL's : At baseline                                       General ADL Comments: Verbal cues for precautions     Vision Patient Visual Report: No change from baseline       Perception Perception: Not tested       Praxis Praxis: Not  tested       Pertinent Vitals/Pain Pain Assessment Pain Assessment: Faces Faces Pain Scale: Hurts a little bit Pain Location: Incisional Pain Intervention(s): Premedicated before session     Extremity/Trunk Assessment Upper Extremity Assessment Upper Extremity Assessment: Overall WFL for tasks assessed   Lower Extremity Assessment Lower Extremity Assessment: Defer to PT evaluation   Cervical / Trunk Assessment Cervical / Trunk Assessment: Neck Surgery   Communication Communication Communication: No apparent difficulties   Cognition Arousal: Alert Behavior During Therapy: WFL for tasks assessed/performed Overall Cognitive Status: Within Functional Limits for tasks assessed                                       General Comments   VSS on RA    Exercises     Shoulder Instructions      Home Living Family/patient expects to be discharged to:: Private residence Living Arrangements: Spouse/significant other Available Help at Discharge: Family;Available 24 hours/day Type of Home: House Home Access: Stairs to enter Entrance  Stairs-Number of Steps: 2 Entrance Stairs-Rails: Right;Left;Can reach both Home Layout: Two level;Laundry or work area in basement;Able to live on main level with bedroom/bathroom     Bathroom Shower/Tub: Producer, television/film/video: Handicapped height Bathroom Accessibility: Yes How Accessible: Accessible via walker Home Equipment: Cane - single point;Grab bars - tub/shower;Rolling Environmental consultant (2 wheels)          Prior Functioning/Environment Prior Level of Function : Independent/Modified Independent;Driving                        OT Problem List: Pain      OT Treatment/Interventions:      OT Goals(Current goals can be found in the care plan section) Acute Rehab OT Goals Patient Stated Goal: Return home OT Goal Formulation: With patient Time For Goal Achievement: 08/11/23 Potential to Achieve Goals: Good  OT  Frequency:      Co-evaluation              AM-PAC OT "6 Clicks" Daily Activity     Outcome Measure Help from another person eating meals?: None Help from another person taking care of personal grooming?: None Help from another person toileting, which includes using toliet, bedpan, or urinal?: None Help from another person bathing (including washing, rinsing, drying)?: None Help from another person to put on and taking off regular upper body clothing?: None Help from another person to put on and taking off regular lower body clothing?: None 6 Click Score: 24   End of Session Equipment Utilized During Treatment: Cervical collar Nurse Communication: Mobility status  Activity Tolerance: Patient tolerated treatment well Patient left: in chair;with call bell/phone within reach;with family/visitor present  OT Visit Diagnosis: Unsteadiness on feet (R26.81)                Time: 6045-4098 OT Time Calculation (min): 21 min Charges:  OT General Charges $OT Visit: 1 Visit OT Evaluation $OT Eval Low Complexity: 1 Low  08/04/2023  RP, OTR/L  Acute Rehabilitation Services  Office:  [phone]   Suzanna Obey 08/04/2023, 9:11 AM

## 2023-08-04 NOTE — Evaluation (Signed)
Physical Therapy Evaluation Patient Details Name: Joshua Higgins MRN: [id number] DOB: [birth year] Today's Date: 08/04/2023  History of Present Illness  78 yo male s/p posterior decompression of C1-C2 on 1/30. PMH includes anemia, OA, CAD s/p CABG 2008, HTN, [name], DMII, OSA not on CPAP, TIA, lumbar laminectomy 2016.  Clinical Impression   Pt presents with mild imbalance with history of neuropathy and falls, increased time and effort to mobilize, and initially decreased knowledge and application of precautions but improved with continued session. Pt ambulatory for good hallway distance without AD, does have moments of weaving of gait when distracted, anticipate this is baseline given pt history of neuropathy. Pt proficiently navigated a flight of stairs with min cues for safety. PT reviewed precautions, and administered handout. PT encouraged pt to avoid walking on uneven terrain (pt mentioning going in the woods with his dog) and avoid getting on tractor post-operatively. All education completed, pt and family with no further questions, pt appropriate to d/c from acute setting from PT perspective.         If plan is discharge home, recommend the following:     Can travel by private vehicle        Equipment Recommendations None recommended by PT  Recommendations for Other Services       Functional Status Assessment Patient has not had a recent decline in their functional status     Precautions / Restrictions Precautions Precautions: Cervical Precaution Booklet Issued: Yes (comment) Required Braces or Orthoses: Cervical Brace Cervical Brace: Soft collar;For comfort Restrictions Weight Bearing Restrictions Per Provider Order: No      Mobility  Bed Mobility Overal bed mobility: Modified Independent             General bed mobility comments: reviewed log roll technique    Transfers Overall transfer level: Modified independent                 General transfer  comment: slow to rise, but no physical assist    Ambulation/Gait Ambulation/Gait assistance: Supervision Gait Distance (Feet): 350 Feet Assistive device: None Gait Pattern/deviations: Step-through pattern, Decreased stride length, Steppage Gait velocity: decr     General Gait Details: mild steppage gait given peripheral neuropathy, supervision for safety  Stairs Stairs: Yes Stairs assistance: Supervision Stair Management: One rail Right, Step to pattern, Alternating pattern, Forwards Number of Stairs: 10 General stair comments: for safety, cues for step-to pattern for safety given neuropathy and history of falls  Wheelchair Mobility     Tilt Bed    Modified Rankin (Stroke Patients Only)       Balance Overall balance assessment: Mild deficits observed, not formally tested, History of Falls                                           Pertinent Vitals/Pain Pain Assessment Pain Assessment: Faces Faces Pain Scale: Hurts a little bit Pain Location: Incisional Pain Descriptors / Indicators: Discomfort Pain Intervention(s): Limited activity within patient's tolerance, Repositioned, Monitored during session    Home Living Family/patient expects to be discharged to:: Private residence Living Arrangements: Spouse/significant other Available Help at Discharge: Family;Available 24 hours/day Type of Home: House Home Access: Stairs to enter Entrance Stairs-Rails: Right;Left;Can reach both Entrance Stairs-Number of Steps: 2   Home Layout: Two level;Laundry or work area in basement;Able to live on main level with bedroom/bathroom Home Equipment: Gilmer Mor - single point;Grab  bars - tub/shower;Rolling Walker (2 wheels)      Prior Function Prior Level of Function : Independent/Modified Independent;Driving             Mobility Comments: enjoys working outside, riding his tractor, caring for his dog       Extremity/Trunk Assessment   Upper Extremity  Assessment Upper Extremity Assessment: Defer to OT evaluation    Lower Extremity Assessment Lower Extremity Assessment: Generalized weakness;LLE deficits/detail;RLE deficits/detail RLE Sensation: history of peripheral neuropathy LLE Sensation: history of peripheral neuropathy    Cervical / Trunk Assessment Cervical / Trunk Assessment: Neck Surgery  Communication   Communication Communication: No apparent difficulties  Cognition Arousal: Alert Behavior During Therapy: WFL for tasks assessed/performed Overall Cognitive Status: Within Functional Limits for tasks assessed                                 General Comments: some lack of insight into deficits        General Comments      Exercises     Assessment/Plan    PT Assessment Patient does not need any further PT services  PT Problem List         PT Treatment Interventions      PT Goals (Current goals can be found in the Care Plan section)  Acute Rehab PT Goals Patient Stated Goal: home PT Goal Formulation: With patient Time For Goal Achievement: 08/04/23 Potential to Achieve Goals: Good    Frequency       Co-evaluation               AM-PAC PT "6 Clicks" Mobility  Outcome Measure Help needed turning from your back to your side while in a flat bed without using bedrails?: None Help needed moving from lying on your back to sitting on the side of a flat bed without using bedrails?: None Help needed moving to and from a bed to a chair (including a wheelchair)?: None Help needed standing up from a chair using your arms (e.g., wheelchair or bedside chair)?: None Help needed to walk in hospital room?: A Little Help needed climbing 3-5 steps with a railing? : A Little 6 Click Score: 22    End of Session Equipment Utilized During Treatment: Cervical collar Activity Tolerance: Patient tolerated treatment well Patient left: with call bell/phone within reach;with family/visitor present (up with  OT) Nurse Communication: Mobility status PT Visit Diagnosis: Other abnormalities of gait and mobility (R26.89);Muscle weakness (generalized) (M62.81)    Time: 1610-9604 PT Time Calculation (min) (ACUTE ONLY): 22 min   Charges:   PT Evaluation $PT Eval Low Complexity: 1 Low   PT General Charges $$ ACUTE PT VISIT: 1 Visit         Marye Round, PT DPT Acute Rehabilitation Services Secure Chat Preferred  Office [phone]   Jeylin Woodmansee E Stroup 08/04/2023, 9:25 AM

## 2023-08-04 NOTE — Discharge Summary (Signed)
Physician Discharge Summary  Patient ID: Joshua Higgins MRN: [id number] DOB/AGE: Aug 26, 1945 78 y.o.  Admit date: 08/03/2023 Discharge date: 08/04/2023  Admission Diagnoses: Cervical spondylosis with myelopathy C1-C2  Discharge Diagnoses: Cervical spondylosis with myelopathy C1-C2 Principal Problem:   Cervical myelopathy [facility])   Discharged Condition: good  Hospital Course: Patient was admitted to undergo surgical decompression of the ring of C1 and undercutting of C2 secondary to spondylitic myelopathy due to pannus formation behind the ring of C1.  He tolerated surgery well  Consults: None  Significant Diagnostic Studies: None  Treatments: surgery: See op note  Discharge Exam: Blood pressure 139/70, pulse 71, temperature 97.9 F (36.6 C), temperature source Oral, resp. rate 20, height 5\' 10"  (1.778 m), weight 77.1 kg, SpO2 96%. Incision is clean and dry Station and gait are intact.  Motor function is good in the upper and lower extremities.  Disposition: Discharge disposition: 01-Home or Self Care       Discharge Instructions     Call MD for:  redness, tenderness, or signs of infection (pain, swelling, redness, odor or green/yellow discharge around incision site)   Complete by: As directed    Call MD for:  severe uncontrolled pain   Complete by: As directed    Call MD for:  temperature >100.4   Complete by: As directed    Diet - low sodium heart healthy   Complete by: As directed    Incentive spirometry RT   Complete by: As directed    Increase activity slowly   Complete by: As directed       Allergies as of 08/04/2023       Reactions   Gemfibrozil Other (See Comments)   Muscle aches   Ace Inhibitors Cough   Lipitor [atorvastatin] Other (See Comments)   Joint pain.        Medication List     TAKE these medications    atenolol 50 MG tablet Commonly known as: Tenormin Take  1 tablet  Daily for BP                                /                                                                    TAKE                                         BY                                                 MOUTH   B-12 1000 MCG Subl Place 1 tablet under the tongue once a week.   Blood Glucose Monitoring Suppl Devi Use kit to check blood sugar once a day   Cholecalciferol 125 MCG (5000 UT) Tabs Take 5,000 Units by mouth at bedtime.   clopidogrel 75 MG tablet Commonly known as: PLAVIX TAKE 1 TABLET BY MOUTH EVERY DAY  Contour Next Test test strip Generic drug: glucose blood USE TO TEST BLOOD SUGAR ONCE DAILY   ezetimibe 10 MG tablet Commonly known as: ZETIA Take 1 tablet (10 mg total) by mouth daily.   famotidine 20 MG tablet Commonly known as: PEPCID TAKE 1 TABLET BY MOUTH TWICE A DAY What changed:  when to take this reasons to take this   Fish Oil 1000 MG Caps Take 1,000 mg by mouth at bedtime.   Lancets Misc Use to test blood sugar once daily   levothyroxine 50 MCG tablet Commonly known as: SYNTHROID TAKE 1 TABLET BY MOUTH DAILY ON AN EMPTY STOMACH WITH ONLY WATER FOR 30 MINUTES & NO ANTACID MEDS, CALCIUM OR MAGNESIUM FOR 4 HOURS & AVOID BIOTIN   metFORMIN 500 MG 24 hr tablet Commonly known as: GLUCOPHAGE-XR TAKE 2 TABLETS BY MOUTH 2 TIMES PER DAY WITH MEALS FOR DIABETES   methocarbamol 500 MG tablet Commonly known as: ROBAXIN Take 1 tablet (500 mg total) by mouth every 6 (six) hours as needed for muscle spasms.   multivitamin tablet Take 1 tablet by mouth at bedtime.   rosuvastatin 10 MG tablet Commonly known as: CRESTOR Take 1 tablet (10 mg total) by mouth See admin instructions. Take one tablet by mouth twice a week Sundays and Wednesdays   traMADol 50 MG tablet Commonly known as: Ultram Take 1 tablet (50 mg total) by mouth every 6 (six) hours as needed for moderate pain (pain score 4-6) or severe pain (pain score 7-10).         Signed: Stefani Dama 08/04/2023, 8:53 AM

## 2023-08-04 NOTE — Discharge Instructions (Addendum)
  Wound Care Keep incision covered and dry until post op day 3. You may remove the Honeycomb dressing on post op day 3. The glue will falls off by itself Do not put any creams, lotions, or ointments on incision. You are fine to shower. Let water run over incision and pat dry.  Activity Walk each and every day, increasing distance each day. No lifting greater than 8 lbs.  Avoid excessive neck motion. No driving, you can ride as a passenger    Return to Work Will be discussed at your follow up appointment.  Call Your Doctor If Any of These Occur Redness, drainage, or swelling at the wound.  Temperature greater than 101 degrees. Severe pain not relieved by pain medication. Incision starts to come apart.  Follow Up Appt Call [phone] if you have one or any problem.

## 2023-08-17 ENCOUNTER — Ambulatory Visit: Payer: Medicare Other | Admitting: Internal Medicine

## 2023-08-25 DIAGNOSIS — G959 Disease of spinal cord, unspecified: Secondary | ICD-10-CM | POA: Diagnosis not present

## 2023-09-20 ENCOUNTER — Ambulatory Visit (INDEPENDENT_AMBULATORY_CARE_PROVIDER_SITE_OTHER): Payer: Medicare Other | Admitting: Internal Medicine

## 2023-09-20 ENCOUNTER — Encounter: Payer: Self-pay | Admitting: Internal Medicine

## 2023-09-20 VITALS — BP 118/72 | HR 65 | Temp 97.9°F | Ht 70.0 in | Wt 170.4 lb

## 2023-09-20 DIAGNOSIS — L578 Other skin changes due to chronic exposure to nonionizing radiation: Secondary | ICD-10-CM | POA: Diagnosis not present

## 2023-09-20 DIAGNOSIS — D539 Nutritional anemia, unspecified: Secondary | ICD-10-CM | POA: Diagnosis not present

## 2023-09-20 DIAGNOSIS — E538 Deficiency of other specified B group vitamins: Secondary | ICD-10-CM

## 2023-09-20 DIAGNOSIS — R1319 Other dysphagia: Secondary | ICD-10-CM | POA: Diagnosis not present

## 2023-09-20 DIAGNOSIS — E039 Hypothyroidism, unspecified: Secondary | ICD-10-CM

## 2023-09-20 DIAGNOSIS — J439 Emphysema, unspecified: Secondary | ICD-10-CM | POA: Insufficient documentation

## 2023-09-20 DIAGNOSIS — J432 Centrilobular emphysema: Secondary | ICD-10-CM | POA: Diagnosis not present

## 2023-09-20 DIAGNOSIS — I6529 Occlusion and stenosis of unspecified carotid artery: Secondary | ICD-10-CM | POA: Insufficient documentation

## 2023-09-20 DIAGNOSIS — G43101 Migraine with aura, not intractable, with status migrainosus: Secondary | ICD-10-CM | POA: Diagnosis not present

## 2023-09-20 DIAGNOSIS — T17908A Unspecified foreign body in respiratory tract, part unspecified causing other injury, initial encounter: Secondary | ICD-10-CM | POA: Insufficient documentation

## 2023-09-20 DIAGNOSIS — R42 Dizziness and giddiness: Secondary | ICD-10-CM | POA: Insufficient documentation

## 2023-09-20 DIAGNOSIS — I6521 Occlusion and stenosis of right carotid artery: Secondary | ICD-10-CM

## 2023-09-20 DIAGNOSIS — I10 Essential (primary) hypertension: Secondary | ICD-10-CM

## 2023-09-20 DIAGNOSIS — G459 Transient cerebral ischemic attack, unspecified: Secondary | ICD-10-CM

## 2023-09-20 DIAGNOSIS — E559 Vitamin D deficiency, unspecified: Secondary | ICD-10-CM

## 2023-09-20 DIAGNOSIS — N182 Chronic kidney disease, stage 2 (mild): Secondary | ICD-10-CM

## 2023-09-20 DIAGNOSIS — I739 Peripheral vascular disease, unspecified: Secondary | ICD-10-CM | POA: Insufficient documentation

## 2023-09-20 HISTORY — DX: Unspecified foreign body in respiratory tract, part unspecified causing other injury, initial encounter: T17.908A

## 2023-09-20 MED ORDER — ATENOLOL 50 MG PO TABS: ORAL_TABLET | ORAL | 3 refills | Status: DC

## 2023-09-20 MED ORDER — EZETIMIBE 10 MG PO TABS: 10.0000 mg | ORAL_TABLET | Freq: Every day | ORAL | 2 refills | Status: DC

## 2023-09-20 MED ORDER — FISH OIL 1000 MG PO CAPS: 1000.0000 mg | ORAL_CAPSULE | Freq: Every day | ORAL | 3 refills | Status: AC

## 2023-09-20 MED ORDER — B-12 1000 MCG SL SUBL: 1.0000 | SUBLINGUAL_TABLET | SUBLINGUAL | 3 refills | Status: DC

## 2023-09-20 MED ORDER — EMPAGLIFLOZIN 10 MG PO TABS: 10.0000 mg | ORAL_TABLET | Freq: Every day | ORAL | 3 refills | Status: DC

## 2023-09-20 MED ORDER — CLOPIDOGREL BISULFATE 75 MG PO TABS: 75.0000 mg | ORAL_TABLET | Freq: Every day | ORAL | 1 refills | Status: DC

## 2023-09-20 MED ORDER — ROSUVASTATIN CALCIUM 10 MG PO TABS: 10.0000 mg | ORAL_TABLET | ORAL | 3 refills | Status: AC

## 2023-09-20 MED ORDER — METFORMIN HCL ER 500 MG PO TB24
ORAL_TABLET | ORAL | 1 refills | Status: AC
Start: 2023-09-20 — End: ?

## 2023-09-20 MED ORDER — LEVOTHYROXINE SODIUM 50 MCG PO TABS
ORAL_TABLET | ORAL | 3 refills | Status: AC
Start: 2023-09-20 — End: ?

## 2023-09-20 MED ORDER — CHOLECALCIFEROL 125 MCG (5000 UT) PO TABS: 5000.0000 [IU] | ORAL_TABLET | Freq: Every day | ORAL | 3 refills | Status: AC

## 2023-09-20 NOTE — Assessment & Plan Note (Signed)
 Assessment: Patient has established carotid atherosclerosis with <50% stenosis of right internal carotid artery. Also has documented atherosclerosis of the abdominal aorta. History of TIA in January 2024. Current management includes rosuvastatin twice weekly due to statin-associated joint pain and clopidogrel for secondary stroke prevention. Plan: Continue rosuvastatin 10 mg twice weekly (Sundays and Wednesdays) for lipid management Continue ezetimibe 10 mg daily for additional lipid management Continue clopidogrel 75 mg daily for secondary stroke prevention Continue omega-3 fatty acids (fish oil) 1000 mg daily to support vascular health Emphasize blood pressure management to prevent further vascular disease Discuss lifestyle modifications including Mediterranean diet pattern Consider carotid ultrasound in 1 year to monitor progression

## 2023-09-20 NOTE — Assessment & Plan Note (Signed)
 Assessment: Patient has established history of migraines with aura, currently managed with atenolol. No reported recent severe episodes or status migrainosus. Visual aura was initially mistaken for TIA symptoms. Plan: Continue atenolol 50 mg daily for migraine prophylaxis Educate on migraine triggers and avoidance strategies Discuss warning signs that would differentiate migraine aura from TIA symptoms

## 2023-09-20 NOTE — Patient Instructions (Addendum)
 Patient Instructions and After Visit Summary Today's Visit: New Patient Appointment Visit Date: September 20, 2023  Your Health Concerns and Our Plan Health Concern What You Need to Know & Do  Blood Cell Size Issue (Macrocytic Anemia) Your red blood cells are larger than normal, a condition you've had since 2008 We're ordering additional blood tests to understand why We're discontinuing the B12 supplements since your B12 levels are very high and not helping this condition Complete the blood work ordered today as soon as possible  Swallowing Difficulties The coughing and choking you experience with meals is concerning for aspiration (food/liquid going into your airway) A referral to Speech Therapy has been placed to evaluate your swallowing While waiting for your appointment, eat slowly, take small bites, and sit upright during meals Avoid talking while eating and drink liquids separately from solids  Diabetes Your diabetes is reasonably controlled with an A1c of 6.2% We're adding a new medication called Jardiance to help protect your kidneys Continue checking your blood sugar once daily Call if you experience increased thirst, frequent urination, or blood sugars consistently above 250 Remember to have your annual eye exam and continue foot care  Vascular Disease You have narrowing in the right carotid artery (less than 50%) and hardening of the aorta Continue the cholesterol medications as prescribed (Crestor twice weekly and Zetia daily) Continue taking Plavix daily to prevent blood clots Maintain a heart-healthy diet low in saturated fats  Emphysema The lung changes from your previous smoking history are stable No specific treatment is needed as you're not having symptoms Let us know if you develop increased shortness of breath or cough Avoid respiratory irritants such as smoke, strong fumes, or pollution  Warning Signs - When to Seek Medical Attention SEEK IMMEDIATE MEDICAL ATTENTION  for: Sudden weakness, numbness, or difficulty speaking Chest pain or severe shortness of breath Choking or inability to swallow Severe dizziness with inability to stand Confusion or significant change in mental status    Your Medication List Medication Instructions  metFORMIN (GLUCOPHAGE-XR) 500 MG 24 hr tablet Take 2 tablets by mouth twice daily with meals for diabetes  empagliflozin (JARDIANCE) 10 MG tablet Take 1 tablet by mouth daily before breakfast  levothyroxine (SYNTHROID) 50 MCG tablet Take 1 tablet by mouth daily on an empty stomach with only water for 30 minutes & no antacid meds, calcium or magnesium for 4 hours  atenolol (TENORMIN) 50 MG tablet Take 1 tablet daily  rosuvastatin (CRESTOR) 10 MG tablet Take 1 tablet by mouth twice a week on Sundays and Wednesdays  ezetimibe (ZETIA) 10 MG tablet Take 1 tablet by mouth daily  clopidogrel (PLAVIX) 75 MG tablet Take 1 tablet by mouth daily  Cholecalciferol 5000 UNITS tablet Take 1 tablet by mouth at bedtime  famotidine (PEPCID) 20 MG tablet Take 1 tablet by mouth daily as needed for indigestion  Blood Glucose Monitoring Supplies Check blood sugar once daily  Multiple Vitamin tablet Take 1 tablet by mouth at bedtime  Tests and Referrals Ordered Today Test/Referral Instructions  Blood Work (CBC with differential, Comprehensive Metabolic Panel, TSH, Free T4) Please complete within 1 week. No fasting required.  Microalbumin/Creatinine Ratio (Urine Test) Please complete within 1 week. Random urine sample is acceptable.  Speech Therapy Referral The referral has been placed. The speech therapy department will contact you to schedule an appointment for swallowing evaluation.  Follow-Up Information Next Appointment: Please schedule a follow-up appointment in 3 months Before Your Next Visit: Complete all laboratory tests as  ordered Attend your speech therapy evaluation Continue checking your blood sugar daily Review your medications to  ensure you have adequate supply Call with any questions or concerns before your next visit    Please contact our office if you have any questions about your treatment plan or if you experience any new or worsening symptoms.   Lab Results  Component Value Date   MCV 104.5 (H) 07/27/2023   MCV 103.3 (H) 05/08/2023   MCV 104.2 (H) 03/24/2023   MCV 104.2 (H) 03/16/2023   MCV 103.5 (H) 01/18/2023   MCV 102.3 (H) 10/07/2022   MCV 100.8 (H) 07/27/2022   MCV 102.6 (H) 07/19/2022   MCV 102.0 (H) 07/18/2022   MCV 101.8 (H) 07/18/2022   MCV 102.2 (H) 07/12/2022   MCV 102.1 (H) 03/25/2022   MCV 102.4 (H) 01/23/2022   MCV 102.9 (H) 12/03/2021   MCV 102.4 (H) 08/24/2021   MCV 102.6 (H) 02/25/2021   MCV 103.4 (H) 10/22/2020   MCV 100.3 (H) 07/22/2020   MCV 102.5 (H) 03/27/2020   MCV 104.1 (H) 12/24/2019   MCV 103.3 (H) 09/19/2019   MCV 102.4 (H) 06/18/2019   MCV 102.6 (H) 03/12/2019   MCV 104.9 (H) 02/18/2019   MCV 101.5 (H) 12/06/2018   MCV 100.8 (H) 09/12/2018   MCV 101.8 (H) 08/29/2018   MCV 101.3 (H) 05/15/2018   MCV 99.5 02/05/2018   MCV 100.2 (H) 10/18/2017   MCV 99.8 06/20/2017   MCV 103.0 (H) 01/31/2017   MCV 101.4 (H) 11/10/2016   MCV 100.5 (H) 07/11/2016   MCV 100.0 02/12/2016   MCV 99.5 11/12/2015   MCV 100.0 10/10/2015   MCV 100.2 (H) 06/04/2015   MCV 98.8 03/04/2015   MCV 98.7 12/02/2014   MCV 100.0 08/25/2014   MCV 97.0 07/30/2014   MCV 100.2 (H) 05/16/2014   MCV 97.6 09/24/2013   MCV 97.0 05/27/2013   MCV 98.5 04/03/2013   MCV 96.0 06/01/2007   MCV 97.1 05/31/2007   MCV 98.6 05/30/2007   MCV 96.6 05/29/2007

## 2023-09-20 NOTE — Assessment & Plan Note (Signed)
 Assessment: Diabetes is reasonably controlled with recent A1c of 6.2% (target <7.0% for this age group). Current management includes metformin. Patient has evidence of early diabetic nephropathy with stage 2 CKD. Recent microalbumin/creatinine ratio at 7 mg/g (within normal range), suggesting minimal albuminuria currently. Adding kidney protection is appropriate given his diabetic kidney disease. Plan: Continue metformin 500 mg extended-release twice daily Initiate empagliflozin (Jardiance) 10 mg daily for renal protection Order microalbumin/creatinine ratio to reassess kidney function Continue home glucose monitoring once daily Dietary counseling regarding carbohydrate management Follow up A1c in 3 months Monitor renal function with regular eGFR measurements

## 2023-09-20 NOTE — Assessment & Plan Note (Signed)
 Assessment: Patient has mild emphysema due to previous smoking history, with radiographic evidence on 2017 chest X-ray showing increased retrosternal airspace, flattened hemidiaphragms, increased AP diameter, and hyperinflation. Currently asymptomatic at rest with oxygen saturation 98% on room air. Former smoker who quit approximately 50 years ago. Plan: No specific treatment required given lack of symptoms Annual influenza vaccination (already received) Pneumococcal vaccination (already received) Avoidance of respiratory irritants Prompt treatment of respiratory infections if they occur

## 2023-09-20 NOTE — Assessment & Plan Note (Signed)
 Assessment: Patient has chronic macrocytosis present since 2008 with progressively increasing red blood cell size (current MCV 104.5). Recent hemoglobin is 12.9 g/dL (low). Despite being on B12 supplementation, levels are paradoxically elevated (>2,000), suggesting B12 is not the underlying cause. Differential diagnosis includes myelodysplastic syndrome, leukemia, liver disease, and other hematologic disorders. Thyroid function appears normal based on TSH values. No alcohol use reported, ruling out alcohol-related macrocytosis. Plan: Order CBC with differential and peripheral blood smear with pathologist review to evaluate red cell morphology Order comprehensive metabolic panel to assess liver function Order TSH + free T4 to rule out thyroid contribution Consider hematology referral if abnormalities identified on smear Continue to monitor hemoglobin and MCV trends Discontinue B12 supplementation as levels are elevated and not addressing the underlying issue

## 2023-09-20 NOTE — Assessment & Plan Note (Signed)
 Assessment: Patient reports longstanding dizziness attributed to right ear issues. Previous evaluations by ENT and physical therapy have not determined a definitive cause. Recent neck surgery for cervical stenosis may potentially impact symptoms. Vestibular, neurological, and vascular etiologies remain in the differential. Plan: Reassess symptoms at follow-up visit after recovery from recent neck surgery Consider vestibular function testing if symptoms persist Maintain medication regimen including atenolol which may help with vestibular migraine if present Safety counseling regarding fall risk

## 2023-09-20 NOTE — Assessment & Plan Note (Signed)
 Assessment: Well-controlled on current levothyroxine dose with recent TSH values in normal range (2.12). No symptoms of hypo- or hyperthyroidism reported. Plan: Continue levothyroxine 50 mcg daily Order TSH and free T4 for monitoring Patient education regarding proper administration (take on empty stomach)

## 2023-09-20 NOTE — Assessment & Plan Note (Signed)
 Assessment: Previously identified vitamin D deficiency, now adequately supplemented with recent level of 76 (normal range). Plan: Continue cholecalciferol 5000 units daily Recheck level in 6 months

## 2023-09-20 NOTE — Assessment & Plan Note (Signed)
 Assessment: Patient experiences coughing and choking during meals affecting both solids and liquids, strongly suggesting oropharyngeal dysphagia with potential aspiration. This presents a significant risk for aspiration pneumonia and requires prompt evaluation. Symptoms occur with nearly every meal. Plan: Urgent referral to speech therapy for swallowing evaluation and therapy Patient education regarding safer swallowing techniques (eating slowly, small bites, proper positioning) Consider modified barium swallow study if recommended by speech therapy Follow up regarding aspiration symptoms at next visit Consider ENT referral if speech therapy evaluation suggests anatomical issues

## 2023-09-20 NOTE — Progress Notes (Signed)
 [facility]  Phone: [phone]  - Medical Office Visit -  Visit Date: 09/20/2023 Patient: Joshua Higgins   DOB: 1946/05/11   78 y.o. Male  MRN: [id number] Patient Care Team: Lula Olszewski, MD as PCP - General (Internal Medicine) Thurmon Fair, MD as PCP - Cardiology (Cardiology) Jethro Bolus, MD as Consulting Physician (Ophthalmology) Barnett Abu, MD as Consulting Physician (Neurosurgery) Dorena Cookey, MD (Inactive) as Consulting Physician (Gastroenterology) Today's Health Care Provider: Lula Olszewski, MD  ===========================================  Chief Complaint / Reason for Visit: New Patient (Initial Visit) and Coughing  (Only when starting to eat.)   Subjective  78 y.o. male who has [name] w/Stage 2 CKD (HCC); Hyperlipidemia associated with type 2 diabetes mellitus (HCC); Hypertension; GERD (gastroesophageal reflux disease); IBS (irritable bowel syndrome); Vitamin D deficiency; CAD s/p CABG; Hypothyroidism; Atherosclerosis of abdominal aorta (HCC) by CXR in 2017 ; CKD stage 2 due to type 2 diabetes mellitus (HCC); Former smoker; History of Mohs micrographic surgery for skin cancer; Macrocytic anemia; B12 deficiency; COVID-19 (01/29/2021); TIA (transient ischemic attack); Prediabetes; Cervical myelopathy (HCC); Carotid atherosclerosis; Emphysema lung (HCC); Dizziness; Aspiration into airway; Migraine with aura and with status migrainosus, not intractable; Dysphagia causing pulmonary aspiration with swallowing; and Peripheral arterial disease (HCC) on their problem list.  Discussed the use of AI scribe software for clinical note transcription with the patient, who gave verbal consent to proceed.  History of Present Illness The patient is a 78 year old male presenting for a new patient appointment today, accompanied by his spouse. He presents with multiple chronic medical conditions requiring comprehensive evaluation and  management. Macrocytosis/Anemia The patient has a history of macrocytosis with large red blood cells noted since at least 2008, which have progressively increased in size over time. Previous evaluations included normal B12 levels, and treatment with B12 did not resolve the issue. Recent labs show hemoglobin of 12.9 g/dL (low) with elevated MCV of 104.5, indicating persistent macrocytic anemia. The patient expresses concern about potential serious conditions such as leukemia or myelodysplasia. Despite being treated with B12 supplements, levels have been paradoxically elevated (>2,000 at recent measurements). Swallowing Issues/Aspiration He reports experiencing coughing spells after eating, which he describes as food "going down the wrong pipe" and causing chest discomfort. This occurs with both solids and liquids at almost every meal, raising concerns about potential esophageal issues and aspiration risk. The symptoms have been persistent and affect his quality of life. Type 2 Diabetes and Renal Issues He has established type 2 diabetes mellitus managed with metformin. He reports mild kidney issues associated with his diabetes. He is not currently on Jardiance, a medication often used to protect kidneys in diabetic patients. Recent A1c is 6.2% (slightly elevated) with eGFR >60 ml/min. Cardiovascular Health He has a history of peripheral artery disease with less than 50% blockage in the right internal carotid artery and hardening of the aorta noted on imaging. His lipid management includes rosuvastatin (Crestor) twice a week (specifically Sundays and Wednesdays) due to joint pain experienced with daily use. He previously took atorvastatin (Lipitor) but discontinued it for the same reason. He reports a recent TIA (January 2024). Neurological Symptoms He has experienced dizziness for many years, which he attributes to issues with his right ear. He has previously seen an ENT specialist and undergone physical  therapy, but the cause remains undetermined. He also reports a history of migraines with aura, initially mistaken for a stroke due to visual disturbances. He takes atenolol for migraine prevention. Respiratory Status He  has a history of smoking with resultant permanent mild emphysema. He quit smoking many years ago (approximately 50 years ago) and denies any current breathing difficulties despite having the diagnosis. A 2017 chest X-ray showed stigmata of emphysema with increased retrosternal airspace, flattened hemidiaphragms, increased AP diameter, and hyperinflation. Recent Procedures He underwent neck surgery in January 2025 after a scan revealed spinal cord compression, which was performed as a posterior cervical laminectomy. Medication Sensitivities He reports previous adverse effects from medications including verapamil and tramadol. He also notes that daily statin therapy causes joint pain, necessitating his current twice-weekly dosing schedule.      Problem list overviews that were updated at today's visit: Problem  Carotid Atherosclerosis   Summary: Less than 50% stenosis of right internal carotid artery. Patient also has documented atherosclerosis of abdominal aorta. History of TIA (January 2024). Key Clinical Data: Right internal carotid artery stenosis <50% on most recent imaging BP well-controlled at today's visit: 118/72 Lipids generally at goal with most recent LDL 53 mg/dL (16/04/9603) HDL 48 mg/dL, Total cholesterol 540 mg/dL (98/05/9146) Management: Statin therapy limited by side effects; currently on rosuvastatin 10 mg twice weekly (Sundays and Wednesdays) Ezetimibe 10 mg daily for additional lipid management Clopidogrel 75 mg daily for secondary stroke prevention Omega-3 fatty acids (fish oil) for vascular health Risk Assessment: Moderate cardiovascular risk given established atherosclerotic disease but good control of modifiable risk factors. Plan: Continue current  medical therapy Emphasize Mediterranean diet pattern Maintain blood pressure control Consider carotid ultrasound in 1 year to monitor progression   Emphysema Lung (Hcc)   Summary: Mild emphysema documented on CXR from 2017 showing increased retrosternal airspace, flattened hemidiaphragms, increased AP diameter, and hyperinflation. Former smoker who quit approximately 50 years ago. Current Status: Asymptomatic at rest Normal oxygen saturation (98% today) No active respiratory complaints No exacerbations requiring treatment Risk Factors: History of smoking (quit ~50 years ago) Age (89) History of aspiration which could potentially worsen pulmonary status Management: No specific treatment required given lack of symptoms Preventive measures including vaccinations:  Influenza vaccine received 05/23/2023 Pneumococcal vaccines completed RSV vaccine received 01/13/2023 Plan: Prompt treatment of respiratory infections if they occur Monitor for development of symptoms Avoidance of respiratory irritants No pulmonary function tests needed at this time unless symptoms develop   Dizziness   Summary: Longstanding dizziness attributed to right ear issues. Previous evaluations by ENT and physical therapy without definitive diagnosis. Recent neck surgery (January 2025) for cervical stenosis may potentially impact symptoms. Clinical Course: Persistent over many years Prior evaluation by ENT without clear etiology identified Prior physical therapy without resolution Potential contributing factors include:  Vestibular dysfunction Cervical spine disease (recently treated with surgery) Vascular disease Possible relationship to migraine disorder Management: Continuing atenolol which may help if vestibular migraine is contributing factor Monitoring for changes in symptoms following recent cervical spine surgery Plan: Reassess symptoms at follow-up after complete recovery from neck surgery Consider  vestibular function testing if symptoms persist or worsen Safety counseling regarding fall prevention Monitor medication effects on symptoms   Aspiration into Airway  Migraine With Aura and With Status Migrainosus, Not Intractable   Summary: History of migraines with aura, initially mistaken for TIA due to visual disturbances. Currently managed with atenolol. Current Status: No recent severe episodes reported No status migrainosus currently Visual aura remains the predominant prodrome when episodes occur Management: Atenolol 50 mg daily for prophylaxis Patient educated on migraine triggers and avoidance strategies Discussion of distinguishing features between migraine aura and TIA symptoms Plan: Continue current  preventive therapy Monitor frequency and severity of episodes Review need for as-needed abortive therapy if episodes increase   Dysphagia Causing Pulmonary Aspiration With Swallowing   Summary: Patient reports persistent coughing and choking during meals with both solids and liquids, suggesting oropharyngeal dysphagia with aspiration risk. Occurs with nearly every meal. Clinical Impact: Affects ability to eat comfortably and safely Poses risk for aspiration pneumonia Potential for nutritional compromise if inadequate intake Management: Speech therapy referral placed today for swallowing evaluation and therapy Patient educated on safer swallowing techniques (eating slowly, small bites, upright positioning) Consider modified barium swallow study based on speech therapy recommendations Risk Assessment: High concern for aspiration pneumonia given frequency of symptoms with both solids and liquids. Plan: Complete speech therapy evaluation Implement recommended swallowing techniques and dietary modifications Follow up on symptom frequency and severity at next visit Consider ENT referral if structural abnormality suspected   Peripheral Arterial Disease (Hcc)  Macrocytic Anemia    Summary: Persistent macrocytosis since 2008 with progressive increase in RBC size. Recent MCV 104.5 (H). Hemoglobin currently 12.9 g/dL (L). Normal iron studies. Paradoxically elevated B12 levels despite supplementation. Key Clinical Data: Labs: MCV 104.5 (07/27/2023) ? prior MCV 103.3 (05/08/2023) Hemoglobin 12.9 g/dL (81/19/1478) ? prior 12.2 g/dL (29/56/2130) Q65 levels consistently elevated: >2,000 (01/18/2023), 1,273 (05/08/2023) Normal ferritin, TIBC, iron saturation No alcohol use reported by patient to account for macrocytosis Management: Discontinuing B12 supplementation as levels are elevated and not addressing underlying issue CBC with differential and peripheral blood smear with pathologist review ordered today Evaluating for potential myelodysplastic syndrome, leukemia, liver disease Thyroid function testing ordered to rule out contribution Risk Assessment: Moderate concern given persistent and progressive macrocytosis despite normal B12 levels, requiring further investigation to rule out serious hematologic disorders. Plan: Complete blood work within 1 week Consider hematology referral based on peripheral smear results Monitor hemoglobin and MCV trends Follow up in 3 months or sooner if significant abnormalities identified  Per most recent structured social history update, " reports no history of alcohol use." Lab Results  Component Value Date   HGB 12.9 (L) 08/03/2023   Lab Results  Component Value Date   VITAMINB12 1,273 (H) 05/08/2023   VITAMINB12 >2,000 (H) 01/18/2023   VITAMINB12 1,913 (H) 03/25/2022   No components found for: "MMA" No results found for: "FOLATE" Lab Results  Component Value Date   TSH 2.12 05/08/2023   TSH 2.08 01/18/2023   TSH 1.96 10/07/2022   No results found for: "FREET4" No results found for: "SPEP" No results found for: "UPEP"    Latest Ref Rng & Units 07/27/2023   11:26 AM  Serum Protein Electrophoresis  Total Protein 6.5 -  8.1 g/dL 7.3    No results found for: "PATHREVIEW"      Hypothyroidism   Summary: Well-controlled on levothyroxine 50 mcg daily with recent TSH values in normal range. Lab Data: TSH 2.12 (05/08/2023) TSH 2.08 (01/18/2023) TSH 1.96 (10/07/2022) Free T4 test ordered today Management: Continue levothyroxine 50 mcg daily Patient reminded about proper administration (take on empty stomach, separate from calcium/iron) Plan: Monitor TSH and free T4 annually or with symptom changes Continue current dose if labs remain in target range      Latest Ref Rng & Units 05/08/2023   10:58 AM 01/18/2023   11:26 AM 10/07/2022   10:37 AM 07/12/2022   12:00 AM 03/25/2022   12:00 AM 12/03/2021    9:57 AM 08/24/2021   10:12 AM  THYROID  TSH 0.40 - 4.50 mIU/L 2.12  2.08  1.96  1.79  1.20  1.70  1.69    anti-TPO antibodies: N/A  anti-Tg antibodies: N/A  Anti-TSI antibodies: N/A   Family history of thyroid disease: Patient last evaluated for thyroid nodules: Last ultrasound for thyroid nodules: N/A  Last heart exam for rhythm check:     Vitamin D Deficiency  [name] w/Stage 2 CKD (HCC)   Summary: Diabetes reasonably controlled with A1c 6.2%. Stage 2 chronic kidney disease with preserved eGFR >60 and minimal albuminuria. Key Clinical Data: A1c 6.2% (07/27/2023), previously 6.4% (05/08/2023) Fasting glucose ranges 99-158 mg/dL in recent tests Microalbumin/creatinine ratio 7 mg/g (05/08/2023) - within normal range eGFR >60 ml/min (07/27/2023), previously 79 ml/min (05/08/2023) Management: Continuing metformin 500 mg extended-release twice daily Initiating empagliflozin (Jardiance) 10 mg daily for renal protection Home glucose monitoring once daily Diet and lifestyle management with carbohydrate control Complications: Early diabetic nephropathy (Stage 2 CKD) No diabetic retinopathy per recent eye exam (05/15/2023) No evidence of peripheral neuropathy on examination Plan: A1c monitoring every 3  months Annual diabetic eye examination Regular monitoring of renal function with eGFR and microalbumin/creatinine ratio Ongoing foot examinations     Medications reviewed and modified: Current Outpatient Medications on File Prior to Visit  Medication Sig   Blood Glucose Monitoring Suppl DEVI Use kit to check blood sugar once a day   famotidine (PEPCID) 20 MG tablet TAKE 1 TABLET BY MOUTH TWICE A DAY (Patient taking differently: Take 20 mg by mouth daily as needed for indigestion.)   glucose blood (CONTOUR NEXT TEST) test strip USE TO TEST BLOOD SUGAR ONCE DAILY   Lancets MISC Use to test blood sugar once daily   Multiple Vitamin (MULTIVITAMIN) tablet Take 1 tablet by mouth at bedtime.    No current facility-administered medications on file prior to visit.   Medications Discontinued During This Encounter  Medication Reason   traMADol (ULTRAM) 50 MG tablet Completed Course   methocarbamol (ROBAXIN) 500 MG tablet Completed Course   Cholecalciferol 5000 UNITS TABS Reorder   Omega-3 Fatty Acids (FISH OIL) 1000 MG CAPS Reorder   Cyanocobalamin (B-12) 1000 MCG SUBL Reorder   rosuvastatin (CRESTOR) 10 MG tablet Reorder   metFORMIN (GLUCOPHAGE-XR) 500 MG 24 hr tablet Reorder   levothyroxine (SYNTHROID) 50 MCG tablet Reorder   clopidogrel (PLAVIX) 75 MG tablet Reorder   atenolol (TENORMIN) 50 MG tablet Reorder   ezetimibe (ZETIA) 10 MG tablet Reorder    Problems: has [name] w/Stage 2 CKD (HCC); Hyperlipidemia associated with type 2 diabetes mellitus (HCC); Hypertension; GERD (gastroesophageal reflux disease); IBS (irritable bowel syndrome); Vitamin D deficiency; CAD s/p CABG; Hypothyroidism; Atherosclerosis of abdominal aorta (HCC) by CXR in 2017 ; CKD stage 2 due to type 2 diabetes mellitus (HCC); Former smoker; History of Mohs micrographic surgery for skin cancer; Macrocytic anemia; B12 deficiency; COVID-19 (01/29/2021); TIA (transient ischemic attack); Prediabetes; Cervical myelopathy  (HCC); Carotid atherosclerosis; Emphysema lung (HCC); Dizziness; Aspiration into airway; Migraine with aura and with status migrainosus, not intractable; Dysphagia causing pulmonary aspiration with swallowing; and Peripheral arterial disease (HCC) on their problem list. Current Meds  Medication Sig   Blood Glucose Monitoring Suppl DEVI Use kit to check blood sugar once a day   empagliflozin (JARDIANCE) 10 MG TABS tablet Take 1 tablet (10 mg total) by mouth daily before breakfast.   famotidine (PEPCID) 20 MG tablet TAKE 1 TABLET BY MOUTH TWICE A DAY (Patient taking differently: Take 20 mg by mouth daily as needed for indigestion.)   glucose blood (CONTOUR NEXT TEST) test strip  USE TO TEST BLOOD SUGAR ONCE DAILY   Lancets MISC Use to test blood sugar once daily   Multiple Vitamin (MULTIVITAMIN) tablet Take 1 tablet by mouth at bedtime.    [DISCONTINUED] atenolol (TENORMIN) 50 MG tablet Take  1 tablet  Daily for BP                                /                                                                   TAKE                                         BY                                                 MOUTH   [DISCONTINUED] Cholecalciferol 5000 UNITS TABS Take 5,000 Units by mouth at bedtime.   [DISCONTINUED] clopidogrel (PLAVIX) 75 MG tablet TAKE 1 TABLET BY MOUTH EVERY DAY   [DISCONTINUED] Cyanocobalamin (B-12) 1000 MCG SUBL Place 1 tablet under the tongue once a week.   [DISCONTINUED] ezetimibe (ZETIA) 10 MG tablet Take 1 tablet (10 mg total) by mouth daily.   [DISCONTINUED] levothyroxine (SYNTHROID) 50 MCG tablet TAKE 1 TABLET BY MOUTH DAILY ON AN EMPTY STOMACH WITH ONLY WATER FOR 30 MINUTES & NO ANTACID MEDS, CALCIUM OR MAGNESIUM FOR 4 HOURS & AVOID BIOTIN   [DISCONTINUED] metFORMIN (GLUCOPHAGE-XR) 500 MG 24 hr tablet TAKE 2 TABLETS BY MOUTH 2 TIMES PER DAY WITH MEALS FOR DIABETES   [DISCONTINUED] Omega-3 Fatty Acids (FISH OIL) 1000 MG CAPS Take 1,000 mg by mouth at bedtime.    [DISCONTINUED]  rosuvastatin (CRESTOR) 10 MG tablet Take 1 tablet (10 mg total) by mouth See admin instructions. Take one tablet by mouth twice a week Sundays and Wednesdays   Allergies:   Allergies  Allergen Reactions   Gemfibrozil Other (See Comments)    Muscle aches   Ace Inhibitors Cough   Lipitor [Atorvastatin] Other (See Comments)    Joint pain.   Past Medical History:  has a past medical history of Allergies, Anemia, Arthritis, Cancer (HCC), Cataracts, bilateral, Coronary artery disease, Diabetes mellitus without complication (HCC), GERD (gastroesophageal reflux disease), Hepatitis, History of sleep apnea, Hyperlipidemia, Hypertension, Hypothyroidism, Migraine headache with aura, Sleep apnea, and TIA (transient ischemic attack) (07/2022). Past Surgical History:   has a past surgical history that includes Cardiac surgery; Knee surgery (Right); Back surgery; Coronary angioplasty (2001); Coronary artery bypass graft; Surgery scrotal / testicular; Elbow surgery (Left); Colonoscopy; Lumbar laminectomy/decompression microdiscectomy (Right, 08/04/2014); Cataract extraction, bilateral (Bilateral, 2018); Posterior cervical laminectomy (N/A, 08/03/2023); and Neck surgery (07/2023). Social History:   reports that he quit smoking about 50 years ago. His smoking use included cigarettes. He has never used smokeless tobacco. He reports that he does not drink alcohol and does not use drugs. Family History:  family history includes Arthritis in his mother; CAD in  his brother and sister; Diabetes in his maternal grandmother and paternal grandmother; Heart attack in his brother and father; Heart disease in his brother, brother, brother, brother, father, sister, and sister; Hypertension in his brother; Ulcers in his mother. Depression Screen and Health Maintenance:    05/08/2023    9:53 PM 05/08/2023    9:47 PM 01/18/2023   11:09 AM 10/06/2022    9:51 PM  PHQ 2/9 Scores  PHQ - 2 Score 0 0 0 0   Health Maintenance  Topic  Date Due   COVID-19 Vaccine (6 - Moderna risk 2024-25 season) 11/20/2023   Medicare Annual Wellness (AWV)  01/18/2024   HEMOGLOBIN A1C  01/24/2024   FOOT EXAM  05/06/2024   Diabetic kidney evaluation - Urine ACR  05/07/2024   OPHTHALMOLOGY EXAM  05/14/2024   Diabetic kidney evaluation - eGFR measurement  07/26/2024   DTaP/Tdap/Td (3 - Tdap) 06/03/2025   Pneumonia Vaccine 67+ Years old  Completed   INFLUENZA VACCINE  Completed   Hepatitis C Screening  Completed   Zoster Vaccines- Shingrix  Completed   HPV VACCINES  Aged Out   Colonoscopy  Discontinued   Fecal DNA (Cologuard)  Discontinued   Immunization History  Administered Date(s) Administered   DT (Pediatric) 08/29/2003, 06/04/2015   Fluad Trivalent(High Dose 65+) 05/23/2023   Influenza Split 02/21/2019   Influenza, High Dose Seasonal PF 04/03/2018, 02/21/2019, 03/05/2020, 03/29/2022   Influenza-Unspecified 04/16/2014, 03/30/2016, 04/02/2017   Moderna SARS-COV2 Booster Vaccination 10/21/2020, 10/22/2020   Moderna Sars-Covid-2 Vaccination 07/16/2019, 08/16/2019, 04/27/2020   Pfizer Covid-19 Vaccine Bivalent Booster 9yrs & up 05/13/2021   Pneumococcal Conjugate-13 12/26/2013   Pneumococcal Polysaccharide-23 01/29/2010   Respiratory Syncytial Virus Vaccine,Recomb Aduvanted(Arexvy) 01/13/2023   Unspecified SARS-COV-2 Vaccination 05/23/2023   Zoster Recombinant(Shingrix) 01/31/2017, 07/05/2017, 08/09/2017   Zoster, Live 05/11/2010     Objective   Physical ExamBP 118/72   Pulse 65   Temp 97.9 F (36.6 C) (Temporal)   Ht 5\' 10"  (1.778 m)   Wt 170 lb 6.4 oz (77.3 kg)   SpO2 98%   BMI 24.45 kg/m  Wt Readings from Last 10 Encounters:  09/20/23 170 lb 6.4 oz (77.3 kg)  08/03/23 170 lb (77.1 kg)  07/27/23 175 lb 12.8 oz (79.7 kg)  07/11/23 179 lb (81.2 kg)  05/08/23 174 lb (78.9 kg)  04/10/23 173 lb 12.8 oz (78.8 kg)  03/24/23 173 lb 6.4 oz (78.7 kg)  03/08/23 176 lb 9.6 oz (80.1 kg)  02/01/23 175 lb (79.4 kg)  01/18/23  177 lb 3.2 oz (80.4 kg)  Vital signs reviewed.  Nursing notes reviewed. Weight trend reviewed. Abnormalities and problem-specific physical exam findings:  wife seems to provide key history details, ? Patient memory but no obvious memory loss or cognitive impairment General Appearance:  Well developed, well nourished, well-groomed, healthy-appearing male with Body mass index is 24.45 kg/m. No acute distress appreciable.   Skin: Clear and well-hydrated. Pulmonary:  Normal work of breathing at rest, no respiratory distress apparent. SpO2: 98 %  Musculoskeletal: He demonstrates smooth and coordinated movements throughout all major joints.All extremities are intact.  Neurological:  Awake, alert, oriented, and engaged.  No obvious focal neurological deficits or cognitive impairments.  Sensorium seems unclouded.  Psychiatric:  Appropriate mood, pleasant and cooperative demeanor, cheerful and engaged during the exam  Reviewed Results & Data Results LABS Red blood cells: macrocytic  RADIOLOGY Right internal carotid artery: less than 50% stenosis Aorta: atherosclerosis Chest x-ray: chronic lung damage from smoking (2017)  No results found for any visits on 09/20/23.  Admission on 08/03/2023, Discharged on 08/04/2023  Component Date Value   Glucose-Capillary 08/03/2023 105 (H)    Sodium 08/03/2023 140    Potassium 08/03/2023 4.2    Chloride 08/03/2023 103    BUN 08/03/2023 18    Creatinine, Ser 08/03/2023 1.20    Glucose, Bld 08/03/2023 105 (H)    Calcium, Ion 08/03/2023 1.17    TCO2 08/03/2023 27    Hemoglobin 08/03/2023 12.9 (L)    HCT 08/03/2023 38.0 (L)    Glucose-Capillary 08/03/2023 141 (H)    Glucose-Capillary 08/03/2023 240 (H)    Comment 1 08/03/2023 Notify RN    Comment 2 08/03/2023 Document in Chart    Glucose-Capillary 08/04/2023 115 (H)    Comment 1 08/04/2023 Notify RN    Comment 2 08/04/2023 Document in Chart   Hospital Outpatient Visit on 07/27/2023  Component Date  Value   Glucose-Capillary 07/27/2023 135 (H)    MRSA, PCR 07/27/2023 NEGATIVE    Staphylococcus aureus 07/27/2023 NEGATIVE    Hgb A1c MFr Bld 07/27/2023 6.2 (H)    Mean Plasma Glucose 07/27/2023 131.24    Sodium 07/27/2023 140    Potassium 07/27/2023 5.8 (H)    Chloride 07/27/2023 102    CO2 07/27/2023 28    Glucose, Bld 07/27/2023 137 (H)    BUN 07/27/2023 14    Creatinine, Ser 07/27/2023 1.19    Calcium 07/27/2023 10.0    Total Protein 07/27/2023 7.3    Albumin 07/27/2023 4.2    AST 07/27/2023 21    ALT 07/27/2023 17    Alkaline Phosphatase 07/27/2023 56    Total Bilirubin 07/27/2023 0.9    GFR, Estimated 07/27/2023 >60    Anion gap 07/27/2023 10    WBC 07/27/2023 6.0    RBC 07/27/2023 3.97 (L)    Hemoglobin 07/27/2023 13.5    HCT 07/27/2023 41.5    MCV 07/27/2023 104.5 (H)    MCH 07/27/2023 34.0    MCHC 07/27/2023 32.5    RDW 07/27/2023 12.4    Platelets 07/27/2023 186    nRBC 07/27/2023 0.0   Orders Only on 07/12/2023  Component Date Value   Fecal Occult Blood, POC 07/12/2023 Negative    Card #2 Fecal Occult Blo* 07/12/2023 Negative    Card #3 Fecal Occult Blo* 07/12/2023 Negative   Abstract on 05/24/2023  Component Date Value   HM Diabetic Eye Exam 05/15/2023 No Retinopathy   Office Visit on 05/08/2023  Component Date Value   Color, Urine 05/08/2023 YELLOW    APPearance 05/08/2023 CLEAR    Specific Gravity, Urine 05/08/2023 1.015    pH 05/08/2023 6.0    Glucose, UA 05/08/2023 NEGATIVE    Bilirubin Urine 05/08/2023 NEGATIVE    Ketones, ur 05/08/2023 NEGATIVE    Hgb urine dipstick 05/08/2023 NEGATIVE    Protein, ur 05/08/2023 NEGATIVE    Nitrite 05/08/2023 NEGATIVE    Leukocytes,Ua 05/08/2023 NEGATIVE    Creatinine, Urine 05/08/2023 95    Microalb, Ur 05/08/2023 0.7    Microalb Creat Ratio 05/08/2023 7    Vitamin B-12 05/08/2023 1,273 (H)    PSA 05/08/2023 0.79    WBC 05/08/2023 5.5    RBC 05/08/2023 3.63 (L)    Hemoglobin 05/08/2023 12.2 (L)    HCT  05/08/2023 37.5 (L)    MCV 05/08/2023 103.3 (H)    MCH 05/08/2023 33.6 (H)    MCHC 05/08/2023 32.5    RDW 05/08/2023 12.0    Platelets 05/08/2023 176  MPV 05/08/2023 9.4    Neutro Abs 05/08/2023 2,855    Absolute Lymphocytes 05/08/2023 1,925    Absolute Monocytes 05/08/2023 506    Eosinophils Absolute 05/08/2023 193    Basophils Absolute 05/08/2023 22    Neutrophils Relative % 05/08/2023 51.9    Total Lymphocyte 05/08/2023 35.0    Monocytes Relative 05/08/2023 9.2    Eosinophils Relative 05/08/2023 3.5    Basophils Relative 05/08/2023 0.4    Glucose, Bld 05/08/2023 138    BUN 05/08/2023 17    Creat 05/08/2023 0.97    eGFR 05/08/2023 79    BUN/Creatinine Ratio 05/08/2023 SEE NOTE:    Sodium 05/08/2023 140    Potassium 05/08/2023 4.7    Chloride 05/08/2023 102    CO2 05/08/2023 31    Calcium 05/08/2023 9.7    Total Protein 05/08/2023 7.0    Albumin 05/08/2023 4.3    Globulin 05/08/2023 2.7    AG Ratio 05/08/2023 1.6    Total Bilirubin 05/08/2023 0.6    Alkaline phosphatase (AP* 05/08/2023 62    AST 05/08/2023 15    ALT 05/08/2023 11    Magnesium 05/08/2023 1.9    Cholesterol 05/08/2023 125    HDL 05/08/2023 48    Triglycerides 05/08/2023 163 (H)    LDL Cholesterol (Calc) 05/08/2023 53    Total CHOL/HDL Ratio 05/08/2023 2.6    Non-HDL Cholesterol (Cal* 05/08/2023 77    TSH 05/08/2023 2.12    Hgb A1c MFr Bld 05/08/2023 6.4 (H)    Mean Plasma Glucose 05/08/2023 137    eAG (mmol/L) 05/08/2023 7.6    Insulin 05/08/2023 49.3 (H)    Vit D, 25-Hydroxy 05/08/2023 76   Office Visit on 04/10/2023  Component Date Value   Glucose, Bld 04/10/2023 99    BUN 04/10/2023 20    Creat 04/10/2023 0.94    eGFR 04/10/2023 82    BUN/Creatinine Ratio 04/10/2023 SEE NOTE:    Sodium 04/10/2023 140    Potassium 04/10/2023 4.7    Chloride 04/10/2023 104    CO2 04/10/2023 25    Calcium 04/10/2023 9.7   Office Visit on 03/24/2023  Component Date Value   WBC 03/24/2023 5.6    RBC  03/24/2023 3.82 (L)    Hemoglobin 03/24/2023 12.5 (L)    HCT 03/24/2023 39.8    MCV 03/24/2023 104.2 (H)    MCH 03/24/2023 32.7    MCHC 03/24/2023 31.4 (L)    RDW 03/24/2023 11.6    Platelets 03/24/2023 288    MPV 03/24/2023 9.4    Neutro Abs 03/24/2023 2,990    Lymphs Abs 03/24/2023 1,865    Absolute Monocytes 03/24/2023 543    Eosinophils Absolute 03/24/2023 162    Basophils Absolute 03/24/2023 39    Neutrophils Relative % 03/24/2023 53.4    Total Lymphocyte 03/24/2023 33.3    Monocytes Relative 03/24/2023 9.7    Eosinophils Relative 03/24/2023 2.9    Basophils Relative 03/24/2023 0.7    Glucose, Bld 03/24/2023 158 (H)    BUN 03/24/2023 20    Creat 03/24/2023 1.05    eGFR 03/24/2023 72    BUN/Creatinine Ratio 03/24/2023 SEE NOTE:    Sodium 03/24/2023 139    Potassium 03/24/2023 5.7 (H)    Chloride 03/24/2023 101    CO2 03/24/2023 28    Calcium 03/24/2023 10.0    Total Protein 03/24/2023 7.0    Albumin 03/24/2023 4.2    Globulin 03/24/2023 2.8    AG Ratio 03/24/2023 1.5    Total Bilirubin 03/24/2023 0.5  Alkaline phosphatase (AP* 03/24/2023 78    AST 03/24/2023 13    ALT 03/24/2023 12   Admission on 03/16/2023, Discharged on 03/16/2023  Component Date Value   Lactic Acid, Venous 03/16/2023 2.6 (HH)    Comment 03/16/2023 NOTIFIED PHYSICIAN    Sodium 03/16/2023 137    Potassium 03/16/2023 3.8    Chloride 03/16/2023 102    CO2 03/16/2023 24    Glucose, Bld 03/16/2023 203 (H)    BUN 03/16/2023 15    Creatinine, Ser 03/16/2023 1.13    Calcium 03/16/2023 8.7 (L)    Total Protein 03/16/2023 6.2 (L)    Albumin 03/16/2023 3.5    AST 03/16/2023 27    ALT 03/16/2023 20    Alkaline Phosphatase 03/16/2023 44    Total Bilirubin 03/16/2023 0.8    GFR, Estimated 03/16/2023 >60    Anion gap 03/16/2023 11    WBC 03/16/2023 9.3    RBC 03/16/2023 3.34 (L)    Hemoglobin 03/16/2023 11.5 (L)    HCT 03/16/2023 34.8 (L)    MCV 03/16/2023 104.2 (H)    MCH 03/16/2023 34.4 (H)     MCHC 03/16/2023 33.0    RDW 03/16/2023 12.6    Platelets 03/16/2023 146 (L)    nRBC 03/16/2023 0.0    Neutrophils Relative % 03/16/2023 81    Neutro Abs 03/16/2023 7.6    Lymphocytes Relative 03/16/2023 11    Lymphs Abs 03/16/2023 1.0    Monocytes Relative 03/16/2023 7    Monocytes Absolute 03/16/2023 0.6    Eosinophils Relative 03/16/2023 0    Eosinophils Absolute 03/16/2023 0.0    Basophils Relative 03/16/2023 0    Basophils Absolute 03/16/2023 0.0    Immature Granulocytes 03/16/2023 1    Abs Immature Granulocytes 03/16/2023 0.06    Prothrombin Time 03/16/2023 15.2    INR 03/16/2023 1.2    aPTT 03/16/2023 25    Specimen Description 03/16/2023 BLOOD SITE NOT SPECIFIED    Special Requests 03/16/2023 BOTTLES DRAWN AEROBIC AND ANAEROBIC Blood Culture adequate volume    Culture 03/16/2023                     Value:NO GROWTH 5 DAYS Performed at [facility], [address] [place], [place] [address]    Report Status 03/16/2023 03/21/2023 FINAL    Specimen Description 03/16/2023 BLOOD RIGHT ARM    Special Requests 03/16/2023 BOTTLES DRAWN AEROBIC AND ANAEROBIC Blood Culture adequate volume    Culture 03/16/2023                     Value:NO GROWTH 5 DAYS Performed at [facility], [address], [place], [place] [address]    Report Status 03/16/2023 03/21/2023 FINAL    Specimen Source 03/16/2023 URINE, CLEAN CATCH    Color, Urine 03/16/2023 YELLOW    APPearance 03/16/2023 CLEAR    Specific Gravity, Urine 03/16/2023 1.016    pH 03/16/2023 5.0    Glucose, UA 03/16/2023 150 (A)    Hgb urine dipstick 03/16/2023 NEGATIVE    Bilirubin Urine 03/16/2023 NEGATIVE    Ketones, ur 03/16/2023 NEGATIVE    Protein, ur 03/16/2023 NEGATIVE    Nitrite 03/16/2023 NEGATIVE    Leukocytes,Ua 03/16/2023 NEGATIVE    RBC / HPF 03/16/2023 6-10    WBC, UA 03/16/2023 0-5    Bacteria, UA 03/16/2023 RARE (A)    Squamous Epithelial / HPF 03/16/2023 0-5    Mucus 03/16/2023 PRESENT     SARS Coronavirus  2 by RT* 03/16/2023 NEGATIVE    Lactic Acid, Venous 03/16/2023 1.7   Office Visit on 01/18/2023  Component Date Value   WBC 01/18/2023 6.1    RBC 01/18/2023 4.05 (L)    Hemoglobin 01/18/2023 13.7    HCT 01/18/2023 41.9    MCV 01/18/2023 103.5 (H)    MCH 01/18/2023 33.8 (H)    MCHC 01/18/2023 32.7    RDW 01/18/2023 11.8    Platelets 01/18/2023 187    MPV 01/18/2023 9.6    Neutro Abs 01/18/2023 3,373    Lymphs Abs 01/18/2023 1,964    Absolute Monocytes 01/18/2023 604    Eosinophils Absolute 01/18/2023 128    Basophils Absolute 01/18/2023 31    Neutrophils Relative % 01/18/2023 55.3    Total Lymphocyte 01/18/2023 32.2    Monocytes Relative 01/18/2023 9.9    Eosinophils Relative 01/18/2023 2.1    Basophils Relative 01/18/2023 0.5    Glucose, Bld 01/18/2023 213 (H)    BUN 01/18/2023 17    Creat 01/18/2023 1.16    eGFR 01/18/2023 64    BUN/Creatinine Ratio 01/18/2023 SEE NOTE:    Sodium 01/18/2023 140    Potassium 01/18/2023 5.4 (H)    Chloride 01/18/2023 101    CO2 01/18/2023 27    Calcium 01/18/2023 10.4 (H)    Total Protein 01/18/2023 7.8    Albumin 01/18/2023 4.9    Globulin 01/18/2023 2.9    AG Ratio 01/18/2023 1.7    Total Bilirubin 01/18/2023 0.5    Alkaline phosphatase (AP* 01/18/2023 62    AST 01/18/2023 18    ALT 01/18/2023 19    Cholesterol 01/18/2023 151    HDL 01/18/2023 55    Triglycerides 01/18/2023 162 (H)    LDL Cholesterol (Calc) 01/18/2023 72    Total CHOL/HDL Ratio 01/18/2023 2.7    Non-HDL Cholesterol (Cal* 01/18/2023 96    TSH 01/18/2023 2.08    Hgb A1c MFr Bld 01/18/2023 6.4 (H)    Iron 01/18/2023 149    TIBC 01/18/2023 349    %SAT 01/18/2023 43    Ferritin 01/18/2023 46    Vitamin B-12 01/18/2023 >2,000 (H)   Office Visit on 10/07/2022  Component Date Value   WBC 10/07/2022 5.2    RBC 10/07/2022 3.87 (L)    Hemoglobin 10/07/2022 13.0 (L)    HCT 10/07/2022 39.6    MCV 10/07/2022 102.3 (H)    MCH 10/07/2022 33.6 (H)     MCHC 10/07/2022 32.8    RDW 10/07/2022 11.8    Platelets 10/07/2022 172    MPV 10/07/2022 9.8    Neutro Abs 10/07/2022 2,746    Lymphs Abs 10/07/2022 1,820    Absolute Monocytes 10/07/2022 504    Eosinophils Absolute 10/07/2022 99    Basophils Absolute 10/07/2022 31    Neutrophils Relative % 10/07/2022 52.8    Total Lymphocyte 10/07/2022 35.0    Monocytes Relative 10/07/2022 9.7    Eosinophils Relative 10/07/2022 1.9    Basophils Relative 10/07/2022 0.6    Glucose, Bld 10/07/2022 154 (H)    BUN 10/07/2022 13    Creat 10/07/2022 0.93    eGFR 10/07/2022 84    BUN/Creatinine Ratio 10/07/2022 SEE NOTE:    Sodium 10/07/2022 145    Potassium 10/07/2022 4.6    Chloride 10/07/2022 105    CO2 10/07/2022 28    Calcium 10/07/2022 9.7    Total Protein 10/07/2022 7.4    Albumin 10/07/2022 4.6    Globulin 10/07/2022 2.8    AG Ratio 10/07/2022  1.6    Total Bilirubin 10/07/2022 0.6    Alkaline phosphatase (AP* 10/07/2022 56    AST 10/07/2022 17    ALT 10/07/2022 14    Magnesium 10/07/2022 1.9    Cholesterol 10/07/2022 117    HDL 10/07/2022 60    Triglycerides 10/07/2022 90    LDL Cholesterol (Calc) 10/07/2022 40    Total CHOL/HDL Ratio 10/07/2022 2.0    Non-HDL Cholesterol (Cal* 10/07/2022 57    TSH 10/07/2022 1.96    Insulin 10/07/2022 62.7 (H)    Vit D, 25-Hydroxy 10/07/2022 58    Hgb A1c MFr Bld 10/07/2022 6.1 (H)    Mean Plasma Glucose 10/07/2022 128    eAG (mmol/L) 10/07/2022 7.1   There may be more visits with results that are not included.       Assessment & Plan Macrocytic anemia Assessment: Patient has chronic macrocytosis present since 2008 with progressively increasing red blood cell size (current MCV 104.5). Recent hemoglobin is 12.9 g/dL (low). Despite being on B12 supplementation, levels are paradoxically elevated (>2,000), suggesting B12 is not the underlying cause. Differential diagnosis includes myelodysplastic syndrome, leukemia, liver disease, and other  hematologic disorders. Thyroid function appears normal based on TSH values. No alcohol use reported, ruling out alcohol-related macrocytosis. Plan: Order CBC with differential and peripheral blood smear with pathologist review to evaluate red cell morphology Order comprehensive metabolic panel to assess liver function Order TSH + free T4 to rule out thyroid contribution Consider hematology referral if abnormalities identified on smear Continue to monitor hemoglobin and MCV trends Discontinue B12 supplementation as levels are elevated and not addressing the underlying issue Atherosclerosis of right carotid artery Assessment: Patient has established carotid atherosclerosis with <50% stenosis of right internal carotid artery. Also has documented atherosclerosis of the abdominal aorta. History of TIA in January 2024. Current management includes rosuvastatin twice weekly due to statin-associated joint pain and clopidogrel for secondary stroke prevention. Plan: Continue rosuvastatin 10 mg twice weekly (Sundays and Wednesdays) for lipid management Continue ezetimibe 10 mg daily for additional lipid management Continue clopidogrel 75 mg daily for secondary stroke prevention Continue omega-3 fatty acids (fish oil) 1000 mg daily to support vascular health Emphasize blood pressure management to prevent further vascular disease Discuss lifestyle modifications including Mediterranean diet pattern Consider carotid ultrasound in 1 year to monitor progression Centrilobular emphysema (HCC) Assessment: Patient has mild emphysema due to previous smoking history, with radiographic evidence on 2017 chest X-ray showing increased retrosternal airspace, flattened hemidiaphragms, increased AP diameter, and hyperinflation. Currently asymptomatic at rest with oxygen saturation 98% on room air. Former smoker who quit approximately 50 years ago. Plan: No specific treatment required given lack of symptoms Annual influenza  vaccination (already received) Pneumococcal vaccination (already received) Avoidance of respiratory irritants Prompt treatment of respiratory infections if they occur Essential hypertension  Migraine with aura and with status migrainosus, not intractable Assessment: Patient has established history of migraines with aura, currently managed with atenolol. No reported recent severe episodes or status migrainosus. Visual aura was initially mistaken for TIA symptoms. Plan: Continue atenolol 50 mg daily for migraine prophylaxis Educate on migraine triggers and avoidance strategies Discuss warning signs that would differentiate migraine aura from TIA symptoms TIA (transient ischemic attack)  Acquired hypothyroidism Assessment: Well-controlled on current levothyroxine dose with recent TSH values in normal range (2.12). No symptoms of hypo- or hyperthyroidism reported. Plan: Continue levothyroxine 50 mcg daily Order TSH and free T4 for monitoring Patient education regarding proper administration (take on empty stomach) Type 2 diabetes  mellitus with stage 2 chronic kidney disease, without long-term current use of insulin (HCC) Assessment: Diabetes is reasonably controlled with recent A1c of 6.2% (target <7.0% for this age group). Current management includes metformin. Patient has evidence of early diabetic nephropathy with stage 2 CKD. Recent microalbumin/creatinine ratio at 7 mg/g (within normal range), suggesting minimal albuminuria currently. Adding kidney protection is appropriate given his diabetic kidney disease. Plan: Continue metformin 500 mg extended-release twice daily Initiate empagliflozin (Jardiance) 10 mg daily for renal protection Order microalbumin/creatinine ratio to reassess kidney function Continue home glucose monitoring once daily Dietary counseling regarding carbohydrate management Follow up A1c in 3 months Monitor renal function with regular eGFR measurements Vitamin D  deficiency Assessment: Previously identified vitamin D deficiency, now adequately supplemented with recent level of 76 (normal range). Plan: Continue cholecalciferol 5000 units daily Recheck level in 6 months B12 deficiency  Dizziness Assessment: Patient reports longstanding dizziness attributed to right ear issues. Previous evaluations by ENT and physical therapy have not determined a definitive cause. Recent neck surgery for cervical stenosis may potentially impact symptoms. Vestibular, neurological, and vascular etiologies remain in the differential. Plan: Reassess symptoms at follow-up visit after recovery from recent neck surgery Consider vestibular function testing if symptoms persist Maintain medication regimen including atenolol which may help with vestibular migraine if present Safety counseling regarding fall risk Dysphagia causing pulmonary aspiration with swallowing Assessment: Patient experiences coughing and choking during meals affecting both solids and liquids, strongly suggesting oropharyngeal dysphagia with potential aspiration. This presents a significant risk for aspiration pneumonia and requires prompt evaluation. Symptoms occur with nearly every meal. Plan: Urgent referral to speech therapy for swallowing evaluation and therapy Patient education regarding safer swallowing techniques (eating slowly, small bites, proper positioning) Consider modified barium swallow study if recommended by speech therapy Follow up regarding aspiration symptoms at next visit Consider ENT referral if speech therapy evaluation suggests anatomical issues Peripheral arterial disease (HCC) Assessment: Patient has established carotid atherosclerosis with <50% stenosis of right internal carotid artery. Also has documented atherosclerosis of the abdominal aorta. History of TIA in January 2024. Current management includes rosuvastatin twice weekly due to statin-associated joint pain and clopidogrel  for secondary stroke prevention. Plan: Continue rosuvastatin 10 mg twice weekly (Sundays and Wednesdays) for lipid management Continue ezetimibe 10 mg daily for additional lipid management Continue clopidogrel 75 mg daily for secondary stroke prevention Continue omega-3 fatty acids (fish oil) 1000 mg daily to support vascular health Emphasize blood pressure management to prevent further vascular disease Discuss lifestyle modifications including Mediterranean diet pattern Consider carotid ultrasound in 1 year to monitor progression  Health Maintenance Assessment: Patient's preventive care is generally up-to-date with completed pneumonia, zoster, and influenza vaccinations. Colorectal cancer screening is discontinued due to age. Annual diabetic eye exam shows no retinopathy. Plan: Continue age-appropriate preventive care Encourage ongoing annual diabetic eye examinations Monitor PSA trend (last value 0.79, normal range) Follow-up Schedule follow-up appointment in 3 months Earlier follow-up if new or worsening symptoms Coordinate with speech therapy for swallowing evaluation Review results of blood work for macrocytosis Continue to monitor blood pressure and diabetes control Medications Metformin 500 mg ER - take 2 tablets twice daily with meals Empagliflozin (Jardiance) 10 mg - take 1 tablet daily before breakfast Levothyroxine 50 mcg - take 1 tablet daily on empty stomach Atenolol 50 mg - take 1 tablet daily Rosuvastatin 10 mg - take 1 tablet twice weekly (Sunday and Wednesday) Ezetimibe 10 mg - take 1 tablet daily Clopidogrel 75 mg - take 1  tablet daily Cholecalciferol 5000 units - take 1 tablet daily at bedtime Famotidine 20 mg - take 1 tablet daily as needed for indigestion Blood glucose monitoring supplies - check blood sugar once daily Multivitamin - take 1 tablet at bedtime   ED Discharge Orders          Ordered    CBC With Diff/Platelet        09/20/23 1501     Pathologist smear review        09/20/23 1501    Comp Met (CMET)        09/20/23 1501    TSH + free T4        09/20/23 1501    Microalbumin / creatinine urine ratio       Comments: [place]    09/20/23 1501    atenolol (TENORMIN) 50 MG tablet        09/20/23 1501    ezetimibe (ZETIA) 10 MG tablet  Daily        09/20/23 1501    Cyanocobalamin (B-12) 1000 MCG SUBL  Weekly        09/20/23 1501    rosuvastatin (CRESTOR) 10 MG tablet  See admin instructions        09/20/23 1501    levothyroxine (SYNTHROID) 50 MCG tablet        09/20/23 1501    clopidogrel (PLAVIX) 75 MG tablet  Daily        09/20/23 1501    metFORMIN (GLUCOPHAGE-XR) 500 MG 24 hr tablet        09/20/23 1501    Omega-3 Fatty Acids (FISH OIL) 1000 MG CAPS  Daily at bedtime        09/20/23 1501    Cholecalciferol 125 MCG (5000 UT) TABS  Daily at bedtime        09/20/23 1501    empagliflozin (JARDIANCE) 10 MG TABS tablet  Daily before breakfast        09/20/23 1501    Ambulatory referral to Speech Therapy       Comments: For a long time he coughs/aspirates solids and liquids at almost every meal.   09/20/23 1501           Follow up 3 months or sooner if desired for unaddressed or new issues  Future Appointments  Date Time Provider Department Center  12/21/2023 10:00 AM Lula Olszewski, MD [id number]  05/15/2024 11:00 AM [name], MD [id number] None         I have personally spent 50 minutes involved in face-to-face and non-face-to-face activities for this patient on the day of the visit. Professional time spent includes the following activities: Preparing to see the patient (review of tests), Obtaining and/or reviewing separately obtained history (admission/discharge record), Performing a medically appropriate examination and/or evaluation , Ordering medications/tests/procedures, referring and communicating with other health care professionals, Documenting clinical information in the EMR, Independently  interpreting results (not separately reported), Communicating results to the patient/family/caregiver, Counseling and educating the patient/family/caregiver and Care coordination (not separately reported).         Additional notes: This document was synthesized by artificial intelligence ([employer]bridge) using HIPAA-compliant recording of the clinical interaction;   We discussed the use of AI scribe software for clinical note transcription with the patient, who gave verbal consent to proceed.    Additional Info: This encounter employed state-of-the-art, real-time, collaborative documentation. The patient actively reviewed and assisted in updating their electronic medical record on a shared screen, ensuring transparency and facilitating  joint problem-solving for the problem list, overview, and plan. This approach promotes accurate, informed care. The treatment plan was discussed and reviewed in detail, including medication safety, potential side effects, and all patient questions. We confirmed understanding and comfort with the plan. Follow-up instructions were established, including contacting the office for any concerns, returning if symptoms worsen, persist, or new symptoms develop, and precautions for potential emergency department visits.  Initial Appointment Goals:  This initial visit focused on establishing a foundation for the patient's care. We collaboratively reviewed his medical history and medications in detail, updating the chart as shown in the encounter. Given the extensive information, we prioritized addressing his most pressing concerns, which he reported were: New Patient (Initial Visit) and Coughing  (Only when starting to eat.)  While the complexity of the patient's medical picture may necessitate further evaluation in subsequent visits, we were able to develop a preliminary care plan together. To expedite a comprehensive plan at the next visit, we encouraged the patient to gather relevant  medical records from previous providers. This collaborative approach will ensure a more complete understanding of the patient's health and inform the development of a personalized care plan. We look forward to continuing the conversation and working together with the patient on achieving his health goals.   Collaborative Documentation:  Today's encounter utilized real-time, dynamic patient engagement.  Patients actively participate by directly reviewing and assisting in updating their medical records through a shared screen. This transparency empowers patients to visually confirm chart updates made by the healthcare provider.  This collaborative approach facilitates problem management as we jointly update the problem list, problem overview, and assessment/plan. Ultimately, this process enhances chart accuracy and completeness, fostering shared decision-making, patient education, and informed consent for tests and treatments.  Collaborative Treatment Planning:  Treatment plans were discussed and reviewed in detail.  Explained medication safety and potential side effects.  Encouraged participation and answered all patient questions, confirming understanding and comfort with the plan. Encouraged patient to contact our office if they have any questions or concerns. Agreed on patient returning to office if symptoms worsen, persist, or new symptoms develop.   Medical Decision Making Attestation Level of Medical Decision Making: High Complexity 1. Number and Complexity of Problems Addressed Today's encounter involved multiple chronic illnesses with significant management challenges requiring high-complexity decision making: Chronic macrocytic anemia with progressively worsening parameters requiring comprehensive diagnostic workup to rule out serious conditions including leukemia and myelodysplastic syndrome. Particularly concerning is the persistent and progressive macrocytosis despite supplementation and  paradoxically elevated B12 levels, suggesting potential serious underlying hematologic disorders. Oropharyngeal dysphagia with aspiration risk presenting a significant threat to patient safety with nearly every meal, requiring urgent evaluation and intervention to prevent potentially serious complications including aspiration pneumonia. Type 2 diabetes with stage 2 CKD requiring medication adjustment (addition of SGLT2 inhibitor) to address renal protection needs beyond glycemic control. Atherosclerotic vascular disease affecting multiple vascular beds (carotid and aortic) with history of TIA, requiring comprehensive risk reduction strategies while managing medication intolerance (statin-associated myalgias). Multiple additional chronic conditions requiring consideration of interactions and comprehensive management: Emphysema Chronic dizziness of uncertain etiology Migraines with aura Hypothyroidism Recent history of cervical myelopathy with surgical intervention 2. Amount and/or Complexity of Data to be Reviewed and Analyzed Extensive data review was required for this new patient encounter, including: Comprehensive review of extensive laboratory data: Multiple CBC results demonstrating progression of macrocytosis (MCV values from multiple dates) Chemistry panels assessing renal and liver function Endocrine studies including thyroid function Specialty labs  including B12 levels, iron studies Diabetic monitoring including multiple A1c values Lipid panels and cardiovascular risk markers Review of multiple imaging studies: Carotid imaging showing right carotid stenosis Chest radiography showing emphysematous changes Prior imaging related to cervical spine disease Extensive medication reconciliation: Detailed review of 12+ medications Assessment of medication effectiveness and side effects Identification of discontinued medications and reasons Evaluation of potential drug  interactions Independent assessment of data patterns: Correlation of hematologic parameters over time to assess disease progression Assessment of renal function trends in relation to diabetes management Evaluation of A1c patterns and response to current therapy 3. Risk of Complications and/or Morbidity or Mortality of Patient Management This patient presents with high-risk management decisions including: Diagnostic decision-making for progressive macrocytic anemia: Risk of delayed diagnosis of potentially serious hematologic disorder Need to balance comprehensive workup with appropriate resource utilization Complex differential diagnosis requiring systematic evaluation Management of aspiration risk: Significant risk for aspiration pneumonia requiring urgent intervention Need for specialized evaluation and treatment planning Potential for serious respiratory complications if not addressed Medication management decisions involving: Addition of SGLT2 inhibitor (empagliflozin) with potential for side effects including genitourinary infections, euglycemic DKA Management of antiplatelet therapy in a patient with vascular disease and risk factors Careful dosing of statin therapy in a patient with documented myalgias Management of multiple medications with potential for interactions Comorbidity management: Need to consider interactions between multiple chronic conditions Balancing treatment needs across cardiovascular, endocrine, hematologic, neurologic and respiratory systems Age-related considerations in a 78 year old patient The medical decision making for this encounter required high complexity due to the multiple serious chronic illnesses, extensive data review spanning multiple organ systems, and high-risk management decisions involving multiple medication adjustments and diagnostic strategies for potentially serious conditions. I have personally evaluated this patient and developed a  comprehensive diagnostic and treatment plan addressing these multiple complex medical issues.  ----------------------------------------------------- Lula Olszewski, MD  09/20/2023 8:24 PM  [facility]:  [phone]

## 2023-09-21 LAB — TIQ- AMBIGUOUS ORDER: UNCLEAR ORDER:: 833

## 2023-09-21 LAB — COMPREHENSIVE METABOLIC PANEL
ALT: 14 U/L (ref 0–53)
AST: 19 U/L (ref 0–37)
Albumin: 4.5 g/dL (ref 3.5–5.2)
Alkaline Phosphatase: 46 U/L (ref 39–117)
BUN: 13 mg/dL (ref 6–23)
CO2: 31 meq/L (ref 19–32)
Calcium: 9.7 mg/dL (ref 8.4–10.5)
Chloride: 101 meq/L (ref 96–112)
Creatinine, Ser: 1.03 mg/dL (ref 0.40–1.50)
GFR: 68.91 mL/min (ref >60.00)
Glucose, Bld: 92 mg/dL (ref 70–99)
Potassium: 4.5 meq/L (ref 3.5–5.1)
Sodium: 140 meq/L (ref 135–145)
Total Bilirubin: 0.4 mg/dL (ref 0.2–1.2)
Total Protein: 7.2 g/dL (ref 6.0–8.3)

## 2023-09-21 LAB — MICROALBUMIN / CREATININE URINE RATIO
Creatinine,U: 55.8 mg/dL
Microalb Creat Ratio: UNDETERMINED mg/g (ref 0.0–30.0)
Microalb, Ur: 0.7 mg/dL (ref 0.0–1.9)

## 2023-09-21 LAB — TSH+FREE T4: TSH W/REFLEX TO FT4: 1.62 m[IU]/L (ref 0.40–4.50)

## 2023-09-27 NOTE — Addendum Note (Signed)
 Addended by: Donzetta Starch on: 09/27/2023 11:46 AM   Modules accepted: Orders

## 2023-10-04 ENCOUNTER — Telehealth: Payer: Self-pay | Admitting: *Deleted

## 2023-10-04 NOTE — Telephone Encounter (Signed)
 Copied from CRM [id number]. Topic: General - Other >> Oct 04, 2023  8:34 AM Rodman Pickle T wrote: Reason for FAO:[id number] had a referral for a speech therapy the number on the referral does not work he needs a call back  Spoke to patient and he tried to call while I was on the phone. Number is incorrect for [facility].

## 2023-10-05 ENCOUNTER — Encounter: Payer: Self-pay | Admitting: Internal Medicine

## 2023-10-09 ENCOUNTER — Telehealth: Payer: Self-pay | Admitting: Internal Medicine

## 2023-10-09 ENCOUNTER — Telehealth: Payer: Self-pay

## 2023-10-09 NOTE — Telephone Encounter (Signed)
 Tried to call pt no answer to help pt .  Copied from CRM [id number]. Topic: Appointments - Scheduling Inquiry for Clinic >> Oct 09, 2023  2:05 PM Florestine Avers wrote: Reason for CRM: Patient needs assistance scheduling speech therapy appointment. I found the number and tried to assist but they did not answer. I gave the patient the number for them to call and schedule.

## 2023-10-09 NOTE — Telephone Encounter (Unsigned)
 Copied from CRM [id number]. Topic: Referral - Status >> Oct 09, 2023  2:37 PM Antwanette L wrote: Reason for CRM: Patient wife Britta Mccreedy) is calling because she cannot make a speech therapy appt at [facility]. Everytime the patient calls, it says the phone is out of service. I did contact [facility] and the front desk left a message for the referral coordination. Britta Mccreedy is requesting the the coordinator to contact her at [phone]

## 2023-10-10 NOTE — Telephone Encounter (Signed)
 Message sent to the referral coordinator by Harriett Sine on 10/09/23.

## 2023-11-08 ENCOUNTER — Encounter: Payer: Self-pay | Admitting: Speech Pathology

## 2023-11-08 ENCOUNTER — Other Ambulatory Visit: Payer: Self-pay

## 2023-11-08 ENCOUNTER — Ambulatory Visit: Attending: Internal Medicine | Admitting: Speech Pathology

## 2023-11-08 DIAGNOSIS — R1319 Other dysphagia: Secondary | ICD-10-CM | POA: Diagnosis not present

## 2023-11-08 DIAGNOSIS — R1312 Dysphagia, oropharyngeal phase: Secondary | ICD-10-CM | POA: Diagnosis not present

## 2023-11-08 NOTE — Therapy (Signed)
 OUTPATIENT SPEECH LANGUAGE PATHOLOGY SWALLOW EVALUATION   Patient Name: Joshua Higgins MRN: [id number] DOB:26-Jan-1946, 78 y.o., male Today's Date: 11/08/2023  PCP: Anthon Kins, MD REFERRING PROVIDER: Anthon Kins, MD  END OF SESSION:  End of Session - 11/08/23 1134     Visit Number 1    Number of Visits 8    Date for SLP Re-Evaluation 01/17/24    SLP Start Time 1100    SLP Stop Time  1145    SLP Time Calculation (min) 45 min    Activity Tolerance Patient tolerated treatment well             Past Medical History:  Diagnosis Date   Allergies    Anemia    low iron   Arthritis    Cancer (HCC)    skin cancer on face   Cataracts, bilateral    Coronary artery disease    CABG - 2008   Diabetes mellitus without complication (HCC)    type II   GERD (gastroesophageal reflux disease)    Hepatitis    History of sleep apnea    Hyperlipidemia    Hypertension    pt states he does not have HTN, takes Atenolol  for migraines   Hypothyroidism    Migraine headache with aura    takes Atenolol    Sleep apnea    does not use cpap   TIA (transient ischemic attack) 07/2022   Past Surgical History:  Procedure Laterality Date   BACK SURGERY     CARDIAC SURGERY     Bypass   CATARACT EXTRACTION, BILATERAL Bilateral 2018   Dr. Gennie Kicks   COLONOSCOPY     CORONARY ANGIOPLASTY  2001   CORONARY ARTERY BYPASS GRAFT     ELBOW SURGERY Left    due to damaged nerve   KNEE SURGERY Right    arthroscopic   LUMBAR LAMINECTOMY/DECOMPRESSION MICRODISCECTOMY Right 08/04/2014   Procedure: Right Lumbar four-five Microdiskectomy;  Surgeon: Elna Haggis, MD;  Location: [facility];  Service: Neurosurgery;  Laterality: Right;  Right L4-5 Microdiskectomy   NECK SURGERY  07/2023   POSTERIOR CERVICAL LAMINECTOMY N/A 08/03/2023   Procedure: Posterior Cervical Laminectomy and Decompresson Cervical One-Cervical Two;  Surgeon: Elna Haggis, MD;  Location: [facility] OR;  Service: Neurosurgery;   Laterality: N/A;  C3   SURGERY SCROTAL / TESTICULAR     as a child/teenager   Patient Active Problem List   Diagnosis Date Noted   Carotid atherosclerosis 09/20/2023   Emphysema lung (HCC) 09/20/2023   Dizziness 09/20/2023   Aspiration into airway 09/20/2023   Migraine with aura and with status migrainosus, not intractable 09/20/2023   Dysphagia causing pulmonary aspiration with swallowing 09/20/2023   Peripheral arterial disease (HCC) 09/20/2023   Cervical myelopathy (HCC) 08/03/2023   Prediabetes 03/12/2023   TIA (transient ischemic attack) 07/18/2022   COVID-19 (01/29/2021) 02/02/2021   B12 deficiency 03/26/2020   Macrocytic anemia 09/20/2019   History of Mohs micrographic surgery for skin cancer 09/19/2019   Former smoker 05/14/2018   CKD stage 2 due to type 2 diabetes mellitus (HCC) 02/02/2018   Atherosclerosis of abdominal aorta (HCC) by CXR in 2017  02/12/2016   Hypothyroidism 11/12/2015   CAD s/p CABG 09/29/2015   Vitamin D  deficiency 05/27/2013   [name] w/Stage 2 CKD (HCC)    Hyperlipidemia associated with type 2 diabetes mellitus (HCC)    Hypertension    GERD (gastroesophageal reflux disease)    IBS (irritable bowel syndrome)     ONSET  DATE: 09/20/2023 (referral date)  REFERRING DIAG: R13.19 (ICD-10-CM) - Dysphagia causing pulmonary aspiration with swallowing  THERAPY DIAG:  Dysphagia, oropharyngeal phase - Plan: SLP modified barium swallow, SLP plan of care cert/re-cert  Rationale for Evaluation and Treatment: Rehabilitation  SUBJECTIVE:   SUBJECTIVE STATEMENT: "He coughs almost every time he eats" Pt accompanied by: significant other Barbara  PERTINENT HISTORY: 78 y.o. reports coughing with meals for 1 year. S/p Posterior decompression of the arch of C1 and undercutting of C2 08/03/23, however they state coughing was prior to surgery.PMH + GERD - he takes antacid as needed for heartburn, does not take famotidine  consistently. Denies coughing when laying  down or in the morning, denies excessive phlegm  PAIN:  Are you having pain? No  FALLS: Has patient fallen in last 6 months?  No  LIVING ENVIRONMENT: Lives with: lives with their spouse Lives in: House/apartment  PLOF:  Level of assistance: Independent with ADLs, Independent with IADLs Employment: Retired  PATIENT GOALS: To not cough when he eats  OBJECTIVE:  Note: Objective measures were completed at Evaluation unless otherwise noted. OBJECTIVE:   I COGNITION: Overall cognitive status: Within functional limits for tasks assessed   SUBJECTIVE DYSPHAGIA REPORTS:  Date of onset: about 1 year Reported symptoms: coughing with solids  Current diet: regular and thin liquids  Co-morbid voice changes: No  FACTORS WHICH MAY INCREASE RISK OF ADVERSE EVENT IN PRESENCE OF ASPIRATION:  General health: well appearing  Risk factors: GERD or other GI disease    ORAL MOTOR EXAMINATION: Overall status: WFL Comments: some missing dentition  CLINICAL SWALLOW ASSESSMENT:   Dentition: missing dentition  Vocal quality at baseline: normal Patient directly observed with POs: Yes: dysphagia 3 (soft) and thin liquids  Feeding: able to feed self Liquids provided by: cup Yale Swallow Protocol: Fail: coughing after successive sips, mild tearing Oral phase signs and symptoms:  n/a Pharyngeal phase signs and symptoms: audible swallow and delayed cough  PATIENT REPORTED OUTCOME MEASURES (PROM): EAT-10: 5 Question Patient's Response  My swallowing problem has caused me to lose weight 0  2.  My swallowing problem interferes with my ability to go out to meals 0  3.  Swallowing liquids takes extra effort 0  4.  Swallowing solids takes extra effort 2  5.  Swallowing pills takes extra effort 0  6.  Swallowing is painful 0  7.  The pleasure of eating is affected by my swallowing 0  8.  When I swallow food sticks in my throat 0  9.  I cough when I eat 3  10.  Swallowing is stressful  0  0= No  problem 4= Severe problem                                                                                                                              TREATMENT DATE:   11/08/23: Eval only - will need MBSS results to generate  HEP and tdetermine diet  modifications and swallow precautions   PATIENT EDUCATION: Education details: Recommend MBSS Person educated: Patient and Spouse Education method: Explanation and Handouts Education comprehension: verbalized understanding   ASSESSMENT:  CLINICAL IMPRESSION: Patient is a 78 y.o. male who was seen today for complaints of significant coughing with meals. His wife states this has been ongoing for 1 year. Posterior decompression of the arch of C1 and undercutting of C2 08/03/23, however they state coughing was prior to surgery.No h/o pulmonary issues. Denies globus, phlegm, hoarse voice, sensation food is going down the wrong way. Denies weight loss and sx of LPR. Does endorse heartburn on occasion. Does not take pepcid  consistently. He coughs with most meals, some coughing episodes become severe. Coughing and mild tearing after [facility] Test. Recommend MBSS and consider esophagram. Pending  results of MBSS, recommend skilled ST to maximize safety of swallow.   OBJECTIVE IMPAIRMENTS: include dysphagia. These impairments are limiting patient from safety when swallowing. Factors affecting potential to achieve goals and functional outcome are  n/a . Patient will benefit from skilled SLP services to address above impairments and improve overall function.  REHAB POTENTIAL: Good   GOALS: Goals reviewed with patient? No - awaiting results of MBSS   LONG TERM GOALS: Target date: 01/17/24 - extended due to await MBSS and scheduling  Pt will complete HEP for dysphagia with mod I Baseline:  Goal status: INITIAL  2.  Pt will follow diet modifications with mod I Baseline:  Goal status: INITIAL  3.  Pt will follow swallow precautions with mod  I Baseline:  Goal status: INITIAL  4.  Pt will verbalize 3 reflux precautions with occasional min A Baseline:  Goal status: INITIAL   PLAN:  SLP FREQUENCY: 2x/week  SLP DURATION: 4 weeks  PLANNED INTERVENTIONS: Aspiration precaution training, Pharyngeal strengthening exercises, Diet toleration management , Environmental controls, Trials of upgraded texture/liquids, Internal/external aids, SLP instruction and feedback, Compensatory strategies, Patient/family education, and [id number] Treatment of swallowing function, MBSS    Ernestina Joe, Dareen Ebbing, CCC-SLP 11/08/2023, 12:04 PM

## 2023-11-08 NOTE — Patient Instructions (Addendum)
  Melody:  Modified Barium Swallow Study is recommended - [facility] will call to schedule  Failed [facility] Swallow Protocol   No worries about aspiration pneumonia as he is healthy  Dysphagia may be esophageal as he has GERD/LPR  If he fails MBSS he will need to call here (outpatient) to schedule therapy - if he passes he does not need to come back for ST  If he passes, he will benefit from an esophagram (I wish they automatically did these at the same time but they don't)  Dr. Soldatova is a Scientist, forensic - in an ENT is recommended for dysphagia this is who we recommend

## 2023-11-10 ENCOUNTER — Other Ambulatory Visit (HOSPITAL_COMMUNITY): Payer: Self-pay | Admitting: Internal Medicine

## 2023-11-10 DIAGNOSIS — R1310 Dysphagia, unspecified: Secondary | ICD-10-CM

## 2023-11-10 DIAGNOSIS — R059 Cough, unspecified: Secondary | ICD-10-CM

## 2023-11-29 ENCOUNTER — Ambulatory Visit (HOSPITAL_COMMUNITY)
Admission: RE | Admit: 2023-11-29 | Discharge: 2023-11-29 | Disposition: A | Discharge status: Home / Self Care | Source: Ambulatory Visit | Attending: Internal Medicine

## 2023-11-29 ENCOUNTER — Ambulatory Visit (HOSPITAL_COMMUNITY)
Admission: RE | Admit: 2023-11-29 | Discharge: 2023-11-29 | Disposition: A | Discharge status: Home / Self Care | Source: Ambulatory Visit | Attending: Internal Medicine | Admitting: Internal Medicine

## 2023-11-29 DIAGNOSIS — R059 Cough, unspecified: Secondary | ICD-10-CM

## 2023-11-29 DIAGNOSIS — R1310 Dysphagia, unspecified: Secondary | ICD-10-CM

## 2023-11-29 DIAGNOSIS — R1312 Dysphagia, oropharyngeal phase: Secondary | ICD-10-CM

## 2023-11-29 DIAGNOSIS — R638 Other symptoms and signs concerning food and fluid intake: Secondary | ICD-10-CM | POA: Diagnosis not present

## 2023-11-29 NOTE — Evaluation (Signed)
 Modified Barium Swallow Study  Patient Details  Name: Joshua Higgins MRN: [id number] Date of Birth: Dec 01, 1945  Today's Date: 11/29/2023  Modified Barium Swallow completed.  Full report located under Chart Review in the Imaging Section.  History of Present Illness Patient is a 78 y.o. male who was referred for OP MBS. He has been seen by OP SLP due to complaints of significant coughing with meals. His wife states this has been ongoing for 1 year. Posterior decompression of the arch of C1 and undercutting of C2 08/03/23, however they state coughing was prior to surgery. No h/o pulmonary issues, no pna. Denies globus, phlegm, hoarse voice, sensation food is going down the wrong way. Denies weight loss and sx of LPR. Does endorse heartburn on occasion and reports occasional regurgitation. Does not take pepcid  consistently. He coughs with most meals, some coughing episodes become severe.   Clinical Impression Pt presents with a mild pharyngeal dysphagia per the DIGEST scoring system (see below).  There appear to be flowing anterior osteophytes along cervical vertebrae (this was not confirmed by radiologist and cervical imaging from Jan 2025 could not be accessed) which intermittently interfere with full epiglottic inversion.  Oral phase was WNL.  Swallow was often triggered at the level of the pyriforms for thinner viscosities, leading to penetration of thin (PAS 4) and nectars (PAS 2).  Despite taxing the swallow with large, sequential boluses, there was no aspiration during the study.  Pharyngeal residue was mild.  13 mm pill lodged in the valleculae, requiring liquid wash and then puree to transit through UES.    Reviewed the video and discussed with Mr. and Mrs. Canada after the study.    1) Cervical vertebrae and narrowing of pharyngeal space appear to be having intermittent impact on epiglottic closure and likely contributed to lodging of pill in that space.  Recommend that pills >13 mm (pt was  able to visualize pill size after study) be broken or crushed.    2) Pt should avoid mixing liquid and solid consistencies and swallowing them simultaneously.  He should take smaller sips and avoid large, sequential swallows. While no aspiration was observed on MBS, frequency of penetration and description of coughing with meals suggests likely intermittent aspiration  He would benefit from f/u therapy to address timing of the swallow, since liquids tended to spill into larynx before its onset.   Pt and his wife verbalized understanding. He will f/u with OP SLP.   DIGEST Swallow Severity Rating*  Safety: 1  Efficiency:1  Overall Pharyngeal Swallow Severity: 1- mild 1: mild; 2: moderate; 3: severe; 4: profound  *The Dynamic Imaging Grade of Swallowing Toxicity is standardized for the head and neck cancer population, however, demonstrates promising clinical applications across populations to standardize the clinical rating of pharyngeal swallow safety and severity.  Factors that may increase risk of adverse event in presence of aspiration Roderick Civatte & Jessy Morocco 2021):  n/a  Swallow Evaluation Recommendations Recommendations: PO diet PO Diet Recommendation: Regular;Thin liquids (Level 0) Liquid Administration via: Cup;Straw Medication Administration: Other (Comment) (whole with liquid, one pill at a time; crush or split if >13 mm) Supervision: Patient able to self-feed Swallowing strategies  : Small bites/sips;Avoid mixed consistencies Postural changes: Position pt fully upright for meals Oral care recommendations: Oral care BID (2x/day)     Lycan Davee L. Beatris Lincoln, MA CCC/SLP Clinical Specialist - Acute Care SLP Acute Rehabilitation Services Office number [phone]  Myna Asal Laurice 11/29/2023,4:48 PM

## 2023-11-30 ENCOUNTER — Ambulatory Visit: Payer: Self-pay | Admitting: Internal Medicine

## 2023-11-30 ENCOUNTER — Encounter: Payer: Self-pay | Admitting: Internal Medicine

## 2023-12-01 ENCOUNTER — Telehealth: Payer: Self-pay

## 2023-12-01 DIAGNOSIS — R1319 Other dysphagia: Secondary | ICD-10-CM

## 2023-12-01 NOTE — Telephone Encounter (Signed)
-----   Message from Joshua Higgins sent at 11/30/2023  9:05 PM EDT ----- Regarding: RE: swallowing test QUICK TALKING POINTS  1. WHAT THE TEST SHOWED - GOOD NEWS     When the patient swallows, a little liquid touches the top of the voice box and makes him cough.     Nothing goes into his lungs, so the chance of pneumonia is very low.  2. WHY HE COUGHS     That brief "touch" tickles his throat, like water "going down the wrong pipe," but it stops before reaching the lungs.  3. SIMPLE SAFETY TIPS HE CAN START TODAY     Use thicker drinks (nectar-thick) instead of thin water or coffee.     Sit straight up while eating and stay upright for 30 minutes afterward.     Take small sips and bites, chew well, and eat slowly.  4. THREE WAYS WE CAN MOVE FORWARD    A. SMALL CHANGES AT HOME    - Thicken liquids, eat smaller meals, keep good posture.    - Best for patients who want the easiest path.     B. WORK WITH A SPEECH THERAPIST    - Learn exercises to make swallowing stronger and get a custom diet plan.    - Best for patients who want active rehab and quicker progress.     C. MORE TESTING OR ENT VISIT    - Camera look at throat muscles to find any deeper cause.    - Best for patients who want all the details.     Patients can start with A and add B or C later if needed.  5. RED-FLAG SYMPTOMS - CALL US  OR 911 RIGHT AWAY     Fever plus coughing after meals.     Trouble breathing while eating.     Severe coughing fits that do not stop.     Significant weight loss because eating feels too hard.  6. NEXT VISIT (JUNE 19)     Ask which option he prefers.     Review how the safety tips are working.     Decide together if therapy or more tests are needed.  KEY MESSAGE FOR PATIENT "Your study shows you are not getting food or drink in your lungs-that is great news. A few simple changes can keep swallowing safe and comfortable, and we will decide together if you want extra  help." ----- Message ----- From: [name], CMA Sent: 11/30/2023   1:34 PM EDT To: Joshua Kins, MD Subject: swallowing test                                Hello Dr [name] could you explain to me the swallowing test results better to be able to tell the pt what's going on that is a lot you sent over thanks.

## 2023-12-01 NOTE — Telephone Encounter (Signed)
 Spoke with pt wife Joshua Higgins about swallowing test results she understood well they would like to have speech therapy to help with him if possible and states they would go over everything more next visit in June.

## 2023-12-01 NOTE — Telephone Encounter (Signed)
 Spoke with pt wife via phone about the swallowing test results she stated that they would like for speech therapy to come in and help they would like to discuss more option and review over better detains with provider next visit in June at appt.

## 2023-12-01 NOTE — Addendum Note (Signed)
 Addended by: Kimberlyn Quiocho G on: 12/01/2023 06:42 PM   Modules accepted: Orders

## 2023-12-13 DIAGNOSIS — L821 Other seborrheic keratosis: Secondary | ICD-10-CM | POA: Diagnosis not present

## 2023-12-13 DIAGNOSIS — L578 Other skin changes due to chronic exposure to nonionizing radiation: Secondary | ICD-10-CM | POA: Diagnosis not present

## 2023-12-13 DIAGNOSIS — Z872 Personal history of diseases of the skin and subcutaneous tissue: Secondary | ICD-10-CM | POA: Diagnosis not present

## 2023-12-13 DIAGNOSIS — L814 Other melanin hyperpigmentation: Secondary | ICD-10-CM | POA: Diagnosis not present

## 2023-12-13 DIAGNOSIS — Z08 Encounter for follow-up examination after completed treatment for malignant neoplasm: Secondary | ICD-10-CM | POA: Diagnosis not present

## 2023-12-13 DIAGNOSIS — D225 Melanocytic nevi of trunk: Secondary | ICD-10-CM | POA: Diagnosis not present

## 2023-12-13 DIAGNOSIS — Z85828 Personal history of other malignant neoplasm of skin: Secondary | ICD-10-CM | POA: Diagnosis not present

## 2023-12-13 DIAGNOSIS — L570 Actinic keratosis: Secondary | ICD-10-CM | POA: Diagnosis not present

## 2023-12-21 ENCOUNTER — Ambulatory Visit: Admitting: Internal Medicine

## 2023-12-21 ENCOUNTER — Encounter: Payer: Self-pay | Admitting: Internal Medicine

## 2023-12-21 VITALS — BP 122/62 | HR 74 | Temp 98.2°F | Ht 70.0 in | Wt 173.6 lb

## 2023-12-21 DIAGNOSIS — G629 Polyneuropathy, unspecified: Secondary | ICD-10-CM | POA: Diagnosis not present

## 2023-12-21 DIAGNOSIS — R1310 Dysphagia, unspecified: Secondary | ICD-10-CM | POA: Diagnosis not present

## 2023-12-21 DIAGNOSIS — R42 Dizziness and giddiness: Secondary | ICD-10-CM

## 2023-12-21 DIAGNOSIS — E538 Deficiency of other specified B group vitamins: Secondary | ICD-10-CM | POA: Diagnosis not present

## 2023-12-21 MED ORDER — B-12 1000 MCG SL SUBL: 1.0000 | SUBLINGUAL_TABLET | SUBLINGUAL | 3 refills | Status: DC

## 2023-12-21 MED ORDER — GABAPENTIN 300 MG PO CAPS: 300.0000 mg | ORAL_CAPSULE | Freq: Three times a day (TID) | ORAL | 3 refills | Status: DC

## 2023-12-21 NOTE — Patient Instructions (Addendum)
 It was a pleasure seeing you today! Your health and satisfaction are our top priorities.  Joshua Curt, MD  Your Providers PCP: Anthon Kins, MD,  [phone]) Referring Provider: Anthon Kins, MD,  [phone]) Care Team Provider: Albert Huff, MD,  [phone]) Care Team Provider: Elna Haggis, MD,  [phone]) Care Team Provider: Delilah Fend, MD,  [phone]) Care Team Provider: Luana Rumple, MD,  [phone])     NEXT STEPS: [x]  Early Intervention: Schedule sooner appointment, call our on-call services, or go to emergency room if there is any significant Increase in pain or discomfort New or worsening symptoms Sudden or severe changes in your health [x]  Flexible Follow-Up: We recommend a Return in about 2 weeks (around 01/04/2024). for optimal routine care. This allows for progress monitoring and treatment adjustments. [x]  Preventive Care: Schedule your annual preventive care visit! It's typically covered by insurance and helps identify potential health issues early. [x]  Lab & X-ray Appointments: Incomplete tests scheduled today, or call to schedule. X-rays: [facility] (M-F, 8:30am-noon or 1pm-5pm). [x]  Medical Information Release: Sign a release form at front desk to obtain relevant medical information we don't have.  MAKING THE MOST OF OUR FOCUSED 20 MINUTE APPOINTMENTS: [x]   Clearly state your top concerns at the beginning of the visit to focus our discussion [x]   If you anticipate you will need more time, please inform the front desk during scheduling - we can book multiple appointments in the same week. [x]   If you have transportation problems- use our convenient video appointments or ask about transportation support. [x]   We can get down to business faster if you use MyChart to update information before the visit and submit non-urgent questions before your visit. Thank you for taking the time to provide details through MyChart.  Let our  nurse know and she can import this information into your encounter documents.  Arrival and Wait Times: [x]   Arriving on time ensures that everyone receives prompt attention. [x]   Early morning (8a) and afternoon (1p) appointments tend to have shortest wait times. [x]   Unfortunately, we cannot delay appointments for late arrivals or hold slots during phone calls.  Getting Answers and Following Up [x]   Simple Questions & Concerns: For quick questions or basic follow-up after your visit, reach us  at [phone] or MyChart messaging. [x]   Complex Concerns: If your concern is more complex, scheduling an appointment might be best. Discuss this with the staff to find the most suitable option. [x]   Lab & Imaging Results: We'll contact you directly if results are abnormal or you don't use MyChart. Most normal results will be on MyChart within 2-3 business days, with a review message from Joshua Higgins. Haven't heard back in 2 weeks? Need results sooner? Contact us  at [phone]. [x]   Referrals: Our referral coordinator will manage specialist referrals. The specialist's office should contact you within 2 weeks to schedule an appointment. Call us  if you haven't heard from them after 2 weeks.  Staying Connected [x]   MyChart: Activate your MyChart for the fastest way to access results and message us . See the last page of this paperwork for instructions on how to activate.  Bring to Your Next Appointment [x]   Medications: Please bring all your medication bottles to your next appointment to ensure we have an accurate record of your prescriptions. [x]   Health Diaries: If you're monitoring any health conditions at home, keeping a diary of your readings can be very helpful for discussions at your next appointment.  Billing [x]   X-ray & Lab Orders: These are billed by separate companies. Contact the invoicing [facility] directly for questions or concerns. [x]   Visit Charges: Discuss any billing inquiries with  our administrative services team.  Your Satisfaction Matters [x]   Share Your Experience: We strive for your satisfaction! If you have any complaints, or preferably compliments, please let [facility] know directly or contact our Practice Administrators, Joshua Higgins or Joshua [facility]gins, by asking at the front desk.   Reviewing Your Records [x]   Review this early draft of your clinical encounter notes below and the final encounter summary tomorrow on MyChart after its been completed.  All orders placed so far are visible here: Neuropathy -     Ambulatory referral to Neurology -     Gabapentin; Take 1 capsule (300 mg total) by mouth 3 (three) times daily.  Dispense: 90 capsule; Refill: 3 -     Ambulatory referral to Physical Therapy -     MR BRAIN W WO CONTRAST; Future  B12 deficiency -     B-12; Place 1 tablet under the tongue once a week.  Dispense: 90 tablet; Refill: 3 -     MR BRAIN W WO CONTRAST; Future  Dysphagia, unspecified type -     MR BRAIN W WO CONTRAST; Future  Dizziness    VISIT SUMMARY:  Today, we discussed your chronic dizziness, neck stiffness, and balance issues, which have been worsening over time. We also addressed your neuropathy symptoms and chronic difficulty swallowing. Your wife was present and actively participated in the discussion. We reviewed your history, including previous evaluations and treatments, and developed a plan to address your symptoms and improve your quality of life.  YOUR PLAN:  -DIZZINESS: Your chronic dizziness, which includes vertigo and visual disturbances, may be due to brainstem compression from cervical spine issues. We will refer you to neurologist Dr. Madie Schilling for further evaluation and order a repeat MRI to assess any changes. You should continue taking B12 supplements for nerve health and start gabapentin to help with dizziness. Acupuncture was also discussed as a low-risk alternative treatment.  -PERIPHERAL NEUROPATHY:  Peripheral neuropathy causes numbness, tingling, and pain in your lower extremities. We will start you on gabapentin to manage the pain and ensure you maintain adequate B12 levels. You will also be referred to Dr. Madie Schilling for further evaluation and consider physical therapy with dry needling.  -DYSPHAGIA: Dysphagia means difficulty swallowing. Despite previous speech therapy, you continue to have issues. We recommend continuing with thickened liquids and alternative swallowing techniques. A referral to speech therapy for further management will be made.  -GENERAL HEALTH MAINTENANCE: Maintaining high B12 levels is crucial for nerve health, and vitamin D  supplementation is beneficial. Continue with your B12 and vitamin D  supplements as previously advised.  INSTRUCTIONS:  Please schedule a follow-up appointment in 2-3 weeks to assess the effectiveness of gabapentin and review your MRI results. Ensure coordination with Dr. Madie Schilling and Dr. Ellery Guthrie for comprehensive management of your dizziness and neuropathy.

## 2023-12-21 NOTE — Assessment & Plan Note (Signed)
 Encouraged patient to resume B12 incase it might help nerve damage to heal.

## 2023-12-21 NOTE — Progress Notes (Signed)
 ==============================  [facility]: [phone]   -- Medical Office Visit --  Patient: Joshua Higgins      Age: 78 y.o.       Sex:  male  Date:   12/21/2023 Today's Healthcare Provider: Anthon Kins, MD  ==============================   Chief Complaint: Hypertension (Pt has been keeping records of bp has been good 136/74 staying there mostly. Still having dizziness all the time.)   Discussed the use of AI scribe software for clinical note transcription with the patient, who gave verbal consent to proceed.  History of Present Illness  78 year old male with chronic dizziness and neck stiffness who presents with worsening dizziness and balance issues. He is accompanied by his wife, who actively participates in the discussion.  He has experienced chronic dizziness for several years, which has worsened over time. He describes episodes of severe vertigo that occur unpredictably, leading him to avoid driving and walking during severe episodes. The dizziness can start suddenly, even when sitting still, and is accompanied by visual disturbances such as photopsia and scotomas. He has undergone evaluations by otolaryngologists and neurosurgeons, but no definitive cause has been identified. Epley maneuvers have been unsuccessful, and no medications, including antivertigo pills, have alleviated his symptoms.  He reports neck stiffness and limited range of motion, identified by a physical therapist. Despite therapy, there has been no improvement in neck mobility. He underwent surgery involving the removal of a bone from the back of his head/neck area, but this has not resolved his dizziness or neck stiffness.  He has a history of a transient ischemic attack, which led to extensive imaging studies including MRI, CT scans, and CT angiograms. These studies revealed a pinched area in his neck. There has been no improvement in symptoms following these  evaluations.  He experiences neuropathy symptoms from his knees down, characterized by numbness and tingling in his legs and feet. He has not tried medications like gabapentin or pregabalin for these symptoms, and no treatments have relieved his neuropathy symptoms thus far.  Background Reviewed: Problem List: has [name] w/Stage 2 CKD (HCC); Hyperlipidemia associated with type 2 diabetes mellitus (HCC); Hypertension; GERD (gastroesophageal reflux disease); IBS (irritable bowel syndrome); Vitamin D  deficiency; CAD s/p CABG; Hypothyroidism; Atherosclerosis of abdominal aorta (HCC) by CXR in 2017 ; CKD stage 2 due to type 2 diabetes mellitus (HCC); Former smoker; History of Mohs micrographic surgery for skin cancer; Macrocytic anemia; B12 deficiency; COVID-19 (01/29/2021); TIA (transient ischemic attack); Prediabetes; Cervical myelopathy (HCC); Carotid atherosclerosis; Emphysema lung (HCC); Dizziness; Migraine with aura and with status migrainosus, not intractable; Dysphagia causing pulmonary aspiration with swallowing; and Peripheral arterial disease (HCC) on their problem list. Past Medical History:  has a past medical history of Allergies, Anemia, Arthritis, Aspiration into airway (09/20/2023), Cancer (HCC), Cataracts, bilateral, Coronary artery disease, Diabetes mellitus without complication (HCC), GERD (gastroesophageal reflux disease), Hepatitis, History of sleep apnea, Hyperlipidemia, Hypertension, Hypothyroidism, Migraine headache with aura, Sleep apnea, and TIA (transient ischemic attack) (07/2022). Past Surgical History:   has a past surgical history that includes Cardiac surgery; Knee surgery (Right); Back surgery; Coronary angioplasty (2001); Coronary artery bypass graft; Surgery scrotal / testicular; Elbow surgery (Left); Colonoscopy; Lumbar laminectomy/decompression microdiscectomy (Right, 08/04/2014); Cataract extraction, bilateral (Bilateral, 2018); Posterior cervical laminectomy (N/A,  08/03/2023); and Neck surgery (07/2023). Social History:   reports that he quit smoking about 50 years ago. His smoking use included cigarettes. He has never used smokeless tobacco. He reports that he does  not drink alcohol and does not use drugs. Family History:  family history includes Arthritis in his mother; CAD in his brother and sister; Diabetes in his maternal grandmother and paternal grandmother; Heart attack in his brother and father; Heart disease in his brother, brother, brother, brother, father, sister, and sister; Hypertension in his brother; Ulcers in his mother. Allergies:  is allergic to gemfibrozil , ace inhibitors, and lipitor [atorvastatin].   Medication Reconciliation: Current Outpatient Medications on File Prior to Visit  Medication Sig   atenolol  (TENORMIN ) 50 MG tablet Take  1 tablet  Daily for BP                                /                                                                   TAKE                                         BY                                                 MOUTH   Blood Glucose Monitoring Suppl DEVI Use kit to check blood sugar once a day   Cholecalciferol  125 MCG (5000 UT) TABS Take 1 tablet (5,000 Units total) by mouth at bedtime.   clopidogrel  (PLAVIX ) 75 MG tablet Take 1 tablet (75 mg total) by mouth daily.   ezetimibe  (ZETIA ) 10 MG tablet Take 1 tablet (10 mg total) by mouth daily.   glucose blood (CONTOUR NEXT TEST) test strip USE TO TEST BLOOD SUGAR ONCE DAILY   Lancets MISC Use to test blood sugar once daily   levothyroxine  (SYNTHROID ) 50 MCG tablet TAKE 1 TABLET BY MOUTH DAILY ON AN EMPTY STOMACH WITH ONLY WATER FOR 30 MINUTES & NO ANTACID MEDS, CALCIUM  OR MAGNESIUM FOR 4 HOURS & AVOID BIOTIN   metFORMIN  (GLUCOPHAGE -XR) 500 MG 24 hr tablet TAKE 2 TABLETS BY MOUTH 2 TIMES PER DAY WITH MEALS FOR DIABETES   Multiple Vitamin (MULTIVITAMIN) tablet Take 1 tablet by mouth at bedtime.    Omega-3 Fatty Acids (FISH OIL ) 1000 MG CAPS Take 1  capsule (1,000 mg total) by mouth at bedtime.   rosuvastatin  (CRESTOR ) 10 MG tablet Take 1 tablet (10 mg total) by mouth See admin instructions. Take one tablet by mouth twice a week Sundays and Wednesdays   empagliflozin  (JARDIANCE ) 10 MG TABS tablet Take 1 tablet (10 mg total) by mouth daily before breakfast. (Patient not taking: Reported on 11/08/2023)   famotidine  (PEPCID ) 20 MG tablet TAKE 1 TABLET BY MOUTH TWICE A DAY (Patient taking differently: Take 20 mg by mouth daily as needed for indigestion.)   No current facility-administered medications on file prior to visit.   Medications Discontinued During This Encounter  Medication Reason   Cyanocobalamin  (B-12) 1000 MCG SUBL Reorder     Physical Exam:    12/21/2023    9:48  AM 09/20/2023    1:47 PM 08/04/2023    7:24 AM  Vitals with BMI  Height 5' 10 5' 10   Weight 173 lbs 10 oz 170 lbs 6 oz   BMI 24.91 24.45   Systolic 122 118 161  Diastolic 62 72 70  Pulse 74 65 71  Vital signs reviewed.  Nursing notes reviewed. Weight trend reviewed. Physical Exam General Appearance:  No acute distress appreciable.   Well-groomed, healthy-appearing male.  Well proportioned with no abnormal fat distribution.  Good muscle tone. Pulmonary:  Normal work of breathing at rest, no respiratory distress apparent. SpO2: 98 %  Musculoskeletal: All extremities are intact.  Neurological:  Awake, alert, oriented, and engaged.  No obvious focal neurological deficits or cognitive impairments.  Sensorium seems unclouded.   Speech is clear and coherent with logical content. Psychiatric:  Appropriate mood, pleasant and cooperative demeanor, thoughtful and engaged during the exam Physical Exam NECK: Very limited range of motion. NEUROLOGICAL: Unable to walk heel to toe. Unable to stand on one foot without support.  Constant dizziness, not postural.   Results:    05/08/2023    9:53 PM 05/08/2023    9:47 PM 01/18/2023   11:09 AM 10/06/2022    9:51 PM  PHQ 2/9  Scores  PHQ - 2 Score 0 0 0 0   Results RADIOLOGY MRI: Cervical spinal stenosis (2024) CT scan: Cervical spinal stenosis (2024) CT angiogram: Cervical spinal stenosis (2024)    No results found for any visits on 12/21/23. Office Visit on 09/20/2023  Component Date Value Ref Range Status   Sodium 09/20/2023 140  135 - 145 mEq/L Final   Potassium 09/20/2023 4.5  3.5 - 5.1 mEq/L Final   Chloride 09/20/2023 101  96 - 112 mEq/L Final   CO2 09/20/2023 31  19 - 32 mEq/L Final   Glucose, Bld 09/20/2023 92  70 - 99 mg/dL Final   BUN 09/60/4540 13  6 - 23 mg/dL Final   Creatinine, Ser 09/20/2023 1.03  0.40 - 1.50 mg/dL Final   Total Bilirubin 09/20/2023 0.4  0.2 - 1.2 mg/dL Final   Alkaline Phosphatase 09/20/2023 46  39 - 117 U/L Final   AST 09/20/2023 19  0 - 37 U/L Final   ALT 09/20/2023 14  0 - 53 U/L Final   Total Protein 09/20/2023 7.2  6.0 - 8.3 g/dL Final   Albumin 98/05/9146 4.5  3.5 - 5.2 g/dL Final   GFR 82/95/6213 68.91  >60.00 mL/min Final   Calcium  09/20/2023 9.7  8.4 - 10.5 mg/dL Final   TSH W/REFLEX TO FT4 09/20/2023 1.62  0.40 - 4.50 mIU/L Final   Microalb, Ur 09/20/2023 0.7  0.0 - 1.9 mg/dL Final   Creatinine,U 08/65/7846 55.8  mg/dL Final   Microalb Creat Ratio 09/20/2023 Unable to calculate  0.0 - 30.0 mg/g Final   QUESTION/PROBLEM 09/20/2023    Final   UNCLEAR ORDER: 09/20/2023 833 NO LONGER AVAILABLE   Final   SPECIMEN(S) SUBMITTED 09/20/2023 LAV   Final  Admission on 08/03/2023, Discharged on 08/04/2023  Component Date Value Ref Range Status   Glucose-Capillary 08/03/2023 105 (H)  70 - 99 mg/dL Final   Sodium 96/29/5284 140  135 - 145 mmol/L Final   Potassium 08/03/2023 4.2  3.5 - 5.1 mmol/L Final   Chloride 08/03/2023 103  98 - 111 mmol/L Final   BUN 08/03/2023 18  8 - 23 mg/dL Final   Creatinine, Ser 08/03/2023 1.20  0.61 - 1.24 mg/dL Final  Glucose, Bld 08/03/2023 105 (H)  70 - 99 mg/dL Final   Calcium , Ion 08/03/2023 1.17  1.15 - 1.40 mmol/L Final   TCO2  08/03/2023 27  22 - 32 mmol/L Final   Hemoglobin 08/03/2023 12.9 (L)  13.0 - 17.0 g/dL Final   HCT 65/78/4696 38.0 (L)  39.0 - 52.0 % Final   Glucose-Capillary 08/03/2023 141 (H)  70 - 99 mg/dL Final   Glucose-Capillary 08/03/2023 240 (H)  70 - 99 mg/dL Final   Comment 1 29/52/8413 Notify RN   Final   Comment 2 08/03/2023 Document in Chart   Final   Glucose-Capillary 08/04/2023 115 (H)  70 - 99 mg/dL Final   Comment 1 24/40/1027 Notify RN   Final   Comment 2 08/04/2023 Document in Chart   Final  Hospital Outpatient Visit on 07/27/2023  Component Date Value Ref Range Status   Glucose-Capillary 07/27/2023 135 (H)  70 - 99 mg/dL Final   MRSA, PCR 25/36/6440 NEGATIVE  NEGATIVE Final   Staphylococcus aureus 07/27/2023 NEGATIVE  NEGATIVE Final   Hgb A1c MFr Bld 07/27/2023 6.2 (H)  4.8 - 5.6 % Final   Mean Plasma Glucose 07/27/2023 131.24  mg/dL Final   Sodium 34/74/2595 140  135 - 145 mmol/L Final   Potassium 07/27/2023 5.8 (H)  3.5 - 5.1 mmol/L Final   Chloride 07/27/2023 102  98 - 111 mmol/L Final   CO2 07/27/2023 28  22 - 32 mmol/L Final   Glucose, Bld 07/27/2023 137 (H)  70 - 99 mg/dL Final   BUN 63/87/5643 14  8 - 23 mg/dL Final   Creatinine, Ser 07/27/2023 1.19  0.61 - 1.24 mg/dL Final   Calcium  07/27/2023 10.0  8.9 - 10.3 mg/dL Final   Total Protein 32/95/1884 7.3  6.5 - 8.1 g/dL Final   Albumin 16/60/6301 4.2  3.5 - 5.0 g/dL Final   AST 60/04/9322 21  15 - 41 U/L Final   ALT 07/27/2023 17  0 - 44 U/L Final   Alkaline Phosphatase 07/27/2023 56  38 - 126 U/L Final   Total Bilirubin 07/27/2023 0.9  0.0 - 1.2 mg/dL Final   GFR, Estimated 07/27/2023 >60  >60 mL/min Final   Anion gap 07/27/2023 10  5 - 15 Final   WBC 07/27/2023 6.0  4.0 - 10.5 K/uL Final   RBC 07/27/2023 3.97 (L)  4.22 - 5.81 MIL/uL Final   Hemoglobin 07/27/2023 13.5  13.0 - 17.0 g/dL Final   HCT 55/73/2202 41.5  39.0 - 52.0 % Final   MCV 07/27/2023 104.5 (H)  80.0 - 100.0 fL Final   MCH 07/27/2023 34.0  26.0 - 34.0  pg Final   MCHC 07/27/2023 32.5  30.0 - 36.0 g/dL Final   RDW 54/27/0623 12.4  11.5 - 15.5 % Final   Platelets 07/27/2023 186  150 - 400 K/uL Final   nRBC 07/27/2023 0.0  0.0 - 0.2 % Final  Orders Only on 07/12/2023  Component Date Value Ref Range Status   Fecal Occult Blood, POC 07/12/2023 Negative  Negative Final   Card #2 Fecal Occult Blod, POC 07/12/2023 Negative   Final   Card #3 Fecal Occult Blood, POC 07/12/2023 Negative   Final  Abstract on 05/24/2023  Component Date Value Ref Range Status   HM Diabetic Eye Exam 05/15/2023 No Retinopathy  No Retinopathy Final  Office Visit on 05/08/2023  Component Date Value Ref Range Status   Color, Urine 05/08/2023 YELLOW  YELLOW Final   APPearance 05/08/2023 CLEAR  CLEAR Final   Specific Gravity, Urine 05/08/2023 1.015  1.001 - 1.035 Final   pH 05/08/2023 6.0  5.0 - 8.0 Final   Glucose, UA 05/08/2023 NEGATIVE  NEGATIVE Final   Bilirubin Urine 05/08/2023 NEGATIVE  NEGATIVE Final   Ketones, ur 05/08/2023 NEGATIVE  NEGATIVE Final   Hgb urine dipstick 05/08/2023 NEGATIVE  NEGATIVE Final   Protein, ur 05/08/2023 NEGATIVE  NEGATIVE Final   Nitrite 05/08/2023 NEGATIVE  NEGATIVE Final   Leukocytes,Ua 05/08/2023 NEGATIVE  NEGATIVE Final   Creatinine, Urine 05/08/2023 95  20 - 320 mg/dL Final   Microalb, Ur 30/86/5784 0.7  mg/dL Final   Microalb Creat Ratio 05/08/2023 7  <30 mg/g creat Final   Vitamin B-12 05/08/2023 1,273 (H)  200 - 1,100 pg/mL Final   PSA 05/08/2023 0.79  < OR = 4.00 ng/mL Final   WBC 05/08/2023 5.5  3.8 - 10.8 Thousand/uL Final   RBC 05/08/2023 3.63 (L)  4.20 - 5.80 Million/uL Final   Hemoglobin 05/08/2023 12.2 (L)  13.2 - 17.1 g/dL Final   HCT 69/62/9528 37.5 (L)  38.5 - 50.0 % Final   MCV 05/08/2023 103.3 (H)  80.0 - 100.0 fL Final   MCH 05/08/2023 33.6 (H)  27.0 - 33.0 pg Final   MCHC 05/08/2023 32.5  32.0 - 36.0 g/dL Final   RDW 41/32/4401 12.0  11.0 - 15.0 % Final   Platelets 05/08/2023 176  140 - 400 Thousand/uL Final    MPV 05/08/2023 9.4  7.5 - 12.5 fL Final   Neutro Abs 05/08/2023 2,855  1,500 - 7,800 cells/uL Final   Absolute Lymphocytes 05/08/2023 1,925  850 - 3,900 cells/uL Final   Absolute Monocytes 05/08/2023 506  200 - 950 cells/uL Final   Eosinophils Absolute 05/08/2023 193  15 - 500 cells/uL Final   Basophils Absolute 05/08/2023 22  0 - 200 cells/uL Final   Neutrophils Relative % 05/08/2023 51.9  % Final   Total Lymphocyte 05/08/2023 35.0  % Final   Monocytes Relative 05/08/2023 9.2  % Final   Eosinophils Relative 05/08/2023 3.5  % Final   Basophils Relative 05/08/2023 0.4  % Final   Glucose, Bld 05/08/2023 138  65 - 139 mg/dL Final   BUN 02/72/5366 17  7 - 25 mg/dL Final   Creat 44/09/4740 0.97  0.70 - 1.28 mg/dL Final   eGFR 59/56/3875 79  > OR = 60 mL/min/1.74m2 Final   BUN/Creatinine Ratio 05/08/2023 SEE NOTE:  6 - 22 (calc) Final   Sodium 05/08/2023 140  135 - 146 mmol/L Final   Potassium 05/08/2023 4.7  3.5 - 5.3 mmol/L Final   Chloride 05/08/2023 102  98 - 110 mmol/L Final   CO2 05/08/2023 31  20 - 32 mmol/L Final   Calcium  05/08/2023 9.7  8.6 - 10.3 mg/dL Final   Total Protein 64/33/2951 7.0  6.1 - 8.1 g/dL Final   Albumin 88/41/6606 4.3  3.6 - 5.1 g/dL Final   Globulin 30/16/0109 2.7  1.9 - 3.7 g/dL (calc) Final   AG Ratio 05/08/2023 1.6  1.0 - 2.5 (calc) Final   Total Bilirubin 05/08/2023 0.6  0.2 - 1.2 mg/dL Final   Alkaline phosphatase (APISO) 05/08/2023 62  35 - 144 U/L Final   AST 05/08/2023 15  10 - 35 U/L Final   ALT 05/08/2023 11  9 - 46 U/L Final   Magnesium 05/08/2023 1.9  1.5 - 2.5 mg/dL Final   Cholesterol 32/35/5732 125  <200 mg/dL Final   HDL 20/25/4270 48  >  OR = 40 mg/dL Final   Triglycerides 16/04/9603 163 (H)  <150 mg/dL Final   LDL Cholesterol (Calc) 05/08/2023 53  mg/dL (calc) Final   Total CHOL/HDL Ratio 05/08/2023 2.6  <5.4 (calc) Final   Non-HDL Cholesterol (Calc) 05/08/2023 77  <130 mg/dL (calc) Final   TSH 09/81/1914 2.12  0.40 - 4.50 mIU/L Final    Hgb A1c MFr Bld 05/08/2023 6.4 (H)  <5.7 % of total Hgb Final   Mean Plasma Glucose 05/08/2023 137  mg/dL Final   eAG (mmol/L) 78/29/5621 7.6  mmol/L Final   Insulin  05/08/2023 49.3 (H)  uIU/mL Final   Vit D, 25-Hydroxy 05/08/2023 76  30 - 100 ng/mL Final  Office Visit on 04/10/2023  Component Date Value Ref Range Status   Glucose, Bld 04/10/2023 99  65 - 99 mg/dL Final   BUN 30/86/5784 20  7 - 25 mg/dL Final   Creat 69/62/9528 0.94  0.70 - 1.28 mg/dL Final   eGFR 41/32/4401 82  > OR = 60 mL/min/1.41m2 Final   BUN/Creatinine Ratio 04/10/2023 SEE NOTE:  6 - 22 (calc) Final   Sodium 04/10/2023 140  135 - 146 mmol/L Final   Potassium 04/10/2023 4.7  3.5 - 5.3 mmol/L Final   Chloride 04/10/2023 104  98 - 110 mmol/L Final   CO2 04/10/2023 25  20 - 32 mmol/L Final   Calcium  04/10/2023 9.7  8.6 - 10.3 mg/dL Final  Office Visit on 03/24/2023  Component Date Value Ref Range Status   WBC 03/24/2023 5.6  3.8 - 10.8 Thousand/uL Final   RBC 03/24/2023 3.82 (L)  4.20 - 5.80 Million/uL Final   Hemoglobin 03/24/2023 12.5 (L)  13.2 - 17.1 g/dL Final   HCT 02/72/5366 39.8  38.5 - 50.0 % Final   MCV 03/24/2023 104.2 (H)  80.0 - 100.0 fL Final   MCH 03/24/2023 32.7  27.0 - 33.0 pg Final   MCHC 03/24/2023 31.4 (L)  32.0 - 36.0 g/dL Final   RDW 44/09/4740 11.6  11.0 - 15.0 % Final   Platelets 03/24/2023 288  140 - 400 Thousand/uL Final   MPV 03/24/2023 9.4  7.5 - 12.5 fL Final   Neutro Abs 03/24/2023 2,990  1,500 - 7,800 cells/uL Final   Lymphs Abs 03/24/2023 1,865  850 - 3,900 cells/uL Final   Absolute Monocytes 03/24/2023 543  200 - 950 cells/uL Final   Eosinophils Absolute 03/24/2023 162  15 - 500 cells/uL Final   Basophils Absolute 03/24/2023 39  0 - 200 cells/uL Final   Neutrophils Relative % 03/24/2023 53.4  % Final   Total Lymphocyte 03/24/2023 33.3  % Final   Monocytes Relative 03/24/2023 9.7  % Final   Eosinophils Relative 03/24/2023 2.9  % Final   Basophils Relative 03/24/2023 0.7  % Final    Glucose, Bld 03/24/2023 158 (H)  65 - 99 mg/dL Final   BUN 59/56/3875 20  7 - 25 mg/dL Final   Creat 64/33/2951 1.05  0.70 - 1.28 mg/dL Final   eGFR 88/41/6606 72  > OR = 60 mL/min/1.98m2 Final   BUN/Creatinine Ratio 03/24/2023 SEE NOTE:  6 - 22 (calc) Final   Sodium 03/24/2023 139  135 - 146 mmol/L Final   Potassium 03/24/2023 5.7 (H)  3.5 - 5.3 mmol/L Final   Chloride 03/24/2023 101  98 - 110 mmol/L Final   CO2 03/24/2023 28  20 - 32 mmol/L Final   Calcium  03/24/2023 10.0  8.6 - 10.3 mg/dL Final   Total Protein 30/16/0109 7.0  6.1 - 8.1 g/dL Final   Albumin 47/42/5956 4.2  3.6 - 5.1 g/dL Final   Globulin 38/75/6433 2.8  1.9 - 3.7 g/dL (calc) Final   AG Ratio 03/24/2023 1.5  1.0 - 2.5 (calc) Final   Total Bilirubin 03/24/2023 0.5  0.2 - 1.2 mg/dL Final   Alkaline phosphatase (APISO) 03/24/2023 78  35 - 144 U/L Final   AST 03/24/2023 13  10 - 35 U/L Final   ALT 03/24/2023 12  9 - 46 U/L Final  Admission on 03/16/2023, Discharged on 03/16/2023  Component Date Value Ref Range Status   Lactic Acid, Venous 03/16/2023 2.6 (HH)  0.5 - 1.9 mmol/L Final   Comment 03/16/2023 NOTIFIED PHYSICIAN   Final   Sodium 03/16/2023 137  135 - 145 mmol/L Final   Potassium 03/16/2023 3.8  3.5 - 5.1 mmol/L Final   Chloride 03/16/2023 102  98 - 111 mmol/L Final   CO2 03/16/2023 24  22 - 32 mmol/L Final   Glucose, Bld 03/16/2023 203 (H)  70 - 99 mg/dL Final   BUN 29/51/8841 15  8 - 23 mg/dL Final   Creatinine, Ser 03/16/2023 1.13  0.61 - 1.24 mg/dL Final   Calcium  03/16/2023 8.7 (L)  8.9 - 10.3 mg/dL Final   Total Protein 66/12/3014 6.2 (L)  6.5 - 8.1 g/dL Final   Albumin 07/12/3233 3.5  3.5 - 5.0 g/dL Final   AST 57/32/2025 27  15 - 41 U/L Final   ALT 03/16/2023 20  0 - 44 U/L Final   Alkaline Phosphatase 03/16/2023 44  38 - 126 U/L Final   Total Bilirubin 03/16/2023 0.8  0.3 - 1.2 mg/dL Final   GFR, Estimated 03/16/2023 >60  >60 mL/min Final   Anion gap 03/16/2023 11  5 - 15 Final   WBC 03/16/2023  9.3  4.0 - 10.5 K/uL Final   RBC 03/16/2023 3.34 (L)  4.22 - 5.81 MIL/uL Final   Hemoglobin 03/16/2023 11.5 (L)  13.0 - 17.0 g/dL Final   HCT 42/70/6237 34.8 (L)  39.0 - 52.0 % Final   MCV 03/16/2023 104.2 (H)  80.0 - 100.0 fL Final   MCH 03/16/2023 34.4 (H)  26.0 - 34.0 pg Final   MCHC 03/16/2023 33.0  30.0 - 36.0 g/dL Final   RDW 62/83/1517 12.6  11.5 - 15.5 % Final   Platelets 03/16/2023 146 (L)  150 - 400 K/uL Final   nRBC 03/16/2023 0.0  0.0 - 0.2 % Final   Neutrophils Relative % 03/16/2023 81  % Final   Neutro Abs 03/16/2023 7.6  1.7 - 7.7 K/uL Final   Lymphocytes Relative 03/16/2023 11  % Final   Lymphs Abs 03/16/2023 1.0  0.7 - 4.0 K/uL Final   Monocytes Relative 03/16/2023 7  % Final   Monocytes Absolute 03/16/2023 0.6  0.1 - 1.0 K/uL Final   Eosinophils Relative 03/16/2023 0  % Final   Eosinophils Absolute 03/16/2023 0.0  0.0 - 0.5 K/uL Final   Basophils Relative 03/16/2023 0  % Final   Basophils Absolute 03/16/2023 0.0  0.0 - 0.1 K/uL Final   Immature Granulocytes 03/16/2023 1  % Final   Abs Immature Granulocytes 03/16/2023 0.06  0.00 - 0.07 K/uL Final   Prothrombin Time 03/16/2023 15.2  11.4 - 15.2 seconds Final   INR 03/16/2023 1.2  0.8 - 1.2 Final   aPTT 03/16/2023 25  24 - 36 seconds Final   Specimen Description 03/16/2023 BLOOD SITE NOT SPECIFIED   Final   Special  Requests 03/16/2023 BOTTLES DRAWN AEROBIC AND ANAEROBIC Blood Culture adequate volume   Final   Culture 03/16/2023    Final                   Value:NO GROWTH 5 DAYS Performed at [facility], [address] [place], [place] [address]    Report Status 03/16/2023 03/21/2023 FINAL   Final   Specimen Description 03/16/2023 BLOOD RIGHT ARM   Final   Special Requests 03/16/2023 BOTTLES DRAWN AEROBIC AND ANAEROBIC Blood Culture adequate volume   Final   Culture 03/16/2023    Final                   Value:NO GROWTH 5 DAYS Performed at [facility], [address], [place], [place] [address]     Report Status 03/16/2023 03/21/2023 FINAL   Final   Specimen Source 03/16/2023 URINE, CLEAN CATCH   Final   Color, Urine 03/16/2023 YELLOW  YELLOW Final   APPearance 03/16/2023 CLEAR  CLEAR Final   Specific Gravity, Urine 03/16/2023 1.016  1.005 - 1.030 Final   pH 03/16/2023 5.0  5.0 - 8.0 Final   Glucose, UA 03/16/2023 150 (A)  NEGATIVE mg/dL Final   Hgb urine dipstick 03/16/2023 NEGATIVE  NEGATIVE Final   Bilirubin Urine 03/16/2023 NEGATIVE  NEGATIVE Final   Ketones, ur 03/16/2023 NEGATIVE  NEGATIVE mg/dL Final   Protein, ur 29/56/2130 NEGATIVE  NEGATIVE mg/dL Final   Nitrite 86/57/8469 NEGATIVE  NEGATIVE Final   Leukocytes,Ua 03/16/2023 NEGATIVE  NEGATIVE Final   RBC / HPF 03/16/2023 6-10  0 - 5 RBC/hpf Final   WBC, UA 03/16/2023 0-5  0 - 5 WBC/hpf Final   Bacteria, UA 03/16/2023 RARE (A)  NONE SEEN Final   Squamous Epithelial / HPF 03/16/2023 0-5  0 - 5 /HPF Final   Mucus 03/16/2023 PRESENT   Final   SARS Coronavirus 2 by RT PCR 03/16/2023 NEGATIVE  NEGATIVE Final   Lactic Acid, Venous 03/16/2023 1.7  0.5 - 1.9 mmol/L Final  Office Visit on 01/18/2023  Component Date Value Ref Range Status   WBC 01/18/2023 6.1  3.8 - 10.8 Thousand/uL Final   RBC 01/18/2023 4.05 (L)  4.20 - 5.80 Million/uL Final   Hemoglobin 01/18/2023 13.7  13.2 - 17.1 g/dL Final   HCT 62/95/2841 41.9  38.5 - 50.0 % Final   MCV 01/18/2023 103.5 (H)  80.0 - 100.0 fL Final   MCH 01/18/2023 33.8 (H)  27.0 - 33.0 pg Final   MCHC 01/18/2023 32.7  32.0 - 36.0 g/dL Final   RDW 32/44/0102 11.8  11.0 - 15.0 % Final   Platelets 01/18/2023 187  140 - 400 Thousand/uL Final   MPV 01/18/2023 9.6  7.5 - 12.5 fL Final   Neutro Abs 01/18/2023 3,373  1,500 - 7,800 cells/uL Final   Lymphs Abs 01/18/2023 1,964  850 - 3,900 cells/uL Final   Absolute Monocytes 01/18/2023 604  200 - 950 cells/uL Final   Eosinophils Absolute 01/18/2023 128  15 - 500 cells/uL Final   Basophils Absolute 01/18/2023 31  0 - 200 cells/uL Final    Neutrophils Relative % 01/18/2023 55.3  % Final   Total Lymphocyte 01/18/2023 32.2  % Final   Monocytes Relative 01/18/2023 9.9  % Final   Eosinophils Relative 01/18/2023 2.1  % Final   Basophils Relative 01/18/2023 0.5  % Final   Glucose, Bld 01/18/2023 213 (H)  65 - 99 mg/dL Final   BUN 72/53/6644  17  7 - 25 mg/dL Final   Creat 04/54/0981 1.16  0.70 - 1.28 mg/dL Final   eGFR 19/14/7829 64  > OR = 60 mL/min/1.63m2 Final   BUN/Creatinine Ratio 01/18/2023 SEE NOTE:  6 - 22 (calc) Final   Sodium 01/18/2023 140  135 - 146 mmol/L Final   Potassium 01/18/2023 5.4 (H)  3.5 - 5.3 mmol/L Final   Chloride 01/18/2023 101  98 - 110 mmol/L Final   CO2 01/18/2023 27  20 - 32 mmol/L Final   Calcium  01/18/2023 10.4 (H)  8.6 - 10.3 mg/dL Final   Total Protein 56/21/3086 7.8  6.1 - 8.1 g/dL Final   Albumin 57/84/6962 4.9  3.6 - 5.1 g/dL Final   Globulin 95/28/4132 2.9  1.9 - 3.7 g/dL (calc) Final   AG Ratio 01/18/2023 1.7  1.0 - 2.5 (calc) Final   Total Bilirubin 01/18/2023 0.5  0.2 - 1.2 mg/dL Final   Alkaline phosphatase (APISO) 01/18/2023 62  35 - 144 U/L Final   AST 01/18/2023 18  10 - 35 U/L Final   ALT 01/18/2023 19  9 - 46 U/L Final   Cholesterol 01/18/2023 151  <200 mg/dL Final   HDL 44/07/270 55  > OR = 40 mg/dL Final   Triglycerides 53/66/4403 162 (H)  <150 mg/dL Final   LDL Cholesterol (Calc) 01/18/2023 72  mg/dL (calc) Final   Total CHOL/HDL Ratio 01/18/2023 2.7  <4.7 (calc) Final   Non-HDL Cholesterol (Calc) 01/18/2023 96  <130 mg/dL (calc) Final   TSH 42/59/5638 2.08  0.40 - 4.50 mIU/L Final   Hgb A1c MFr Bld 01/18/2023 6.4 (H)  <5.7 % of total Hgb Final   Iron 01/18/2023 149  50 - 180 mcg/dL Final   TIBC 75/64/3329 349  250 - 425 mcg/dL (calc) Final   %SAT 51/88/4166 43  20 - 48 % (calc) Final   Ferritin 01/18/2023 46  24 - 380 ng/mL Final   Vitamin B-12 01/18/2023 >2,000 (H)  200 - 1,100 pg/mL Final  There may be more visits with results that are not included.  No image results  found. Francia [facility] SPEECH PATH Result Date: 11/29/2023 CLINICAL DATA:  Dysphagia. Intermittent cough after swallowing. Request for modified barium swallow with speech pathology. EXAM: MODIFIED BARIUM SWALLOW TECHNIQUE: Different consistencies of barium were administered orally to the patient by the Speech Pathologist. Imaging of the pharynx was performed in the lateral projection. This exam was performed by Prudence Brown PA-C , and was supervised and interpreted by Dr. Aundra Lee. FLUOROSCOPY: Radiation Exposure Index (as provided by the fluoroscopic device): 33.06 mGy Kerma COMPARISON:  None Available. FINDINGS: Vestibular Penetration: Consistent penetration to the level of the cords seen with thin barium. Brief vestibular penetration seen with nectar thick consistency. Aspiration:  None seen. Other:  None. IMPRESSION: Penetration without aspiration. Please refer to the Speech Pathologist's report for complete details and recommendations. Electronically Signed   By: Aundra Lee M.D.   On: 11/29/2023 13:16         Assessment & Plan Neuropathy He presents with distal symmetric polyneuropathy, including numbness, tingling, and pain in the lower extremities. Current medications are ineffective. He has not trialed gabapentin or pregabalin. B12 supplementation's role in nerve healing was discussed. Initiate gabapentin for neuropathic pain management and ensure adequate B12 levels. Refer to neurologist Dr. Madie Schilling for further evaluation. Consider physical therapy with dry needling for management. B12 deficiency Encouraged patient to resume B12 incase it might help nerve damage  to heal. Dysphagia, unspecified type Chronic dysphagia persists despite prior speech therapy intervention. Previous evaluations included a videofluoroscopic swallow study. Management strategies include thickened liquids and alternative swallowing techniques. Consider referral to speech therapy for further  management. Discuss thickened liquids and alternative swallowing techniques with a speech therapist. Suspicious for potential relationship with dizziness- in particular I have concern(s) about cerebellar function as responsible. Dizziness MAJOR ISSUE: Chronic dizziness is worsening, marked by vertigo, photopsia, and balance issues.  Possibly worsened by discontinuing B12. Previous ENT and neurosurgery evaluations were inconclusive. Differential diagnosis includes craniocervical junction syndrome and brainstem compression from cervical spine issues. He has a history of cerebrovascular accident and cervical spine surgery by Dr. Ellery Guthrie. Current hypothesis suggests brainstem compression, possibly worsened by a small foramen magnum. Acupuncture was discussed as a low-risk alternative treatment. Urgent referral to neurologist Dr. Madie Schilling is needed for further evaluation of dizziness and potential brainstem compression. Order a repeat MRI to assess changes in brainstem compression. Continue B12 supplementation for nerve health and initiate gabapentin for potential relief of nerve-related dizziness. Coordinate with Dr. Ellery Guthrie regarding potential brainstem compression and previous cervical spine surgery outcomes. Consider acupuncture as a complementary therapy. Emphasis on maintaining high B12 levels for nerve health and vitamin D  supplementation benefits. B12 supplementation is crucial despite high levels to prevent nerve damage and support healing. Continue vitamin D  supplementation as previously advised and ensure consistent B12 supplementation.  Follow-up   Follow-up plans are in place to monitor treatment efficacy and further evaluations. Coordination with specialists is essential due to the complexity of his condition. Schedule a follow-up appointment in 2-3 weeks to assess the effectiveness of gabapentin and review MRI results. Ensure coordination with Dr. Cordella Deter and Dr. Ellery Guthrie for comprehensive management  of dizziness and neuropathy.       Orders Placed in Encounter:    Imaging Orders         MR Brain W Wo Contrast     Referral Orders         Ambulatory referral to Neurology         Ambulatory referral to Physical Therapy     Meds ordered this encounter  Medications   gabapentin (NEURONTIN) 300 MG capsule    Sig: Take 1 capsule (300 mg total) by mouth 3 (three) times daily.    Dispense:  90 capsule    Refill:  3   Cyanocobalamin  (B-12) 1000 MCG SUBL    Sig: Place 1 tablet under the tongue once a week.    Dispense:  90 tablet    Refill:  3       This document was synthesized by artificial intelligence ([employer]bridge) using HIPAA-compliant recording of the clinical interaction;   We discussed the use of AI scribe software for clinical note transcription with the patient, who gave verbal consent to proceed. additional Info: This encounter employed state-of-the-art, real-time, collaborative documentation. The patient actively reviewed and assisted in updating their electronic medical record on a shared screen, ensuring transparency and facilitating joint problem-solving for the problem list, overview, and plan. This approach promotes accurate, informed care. The treatment plan was discussed and reviewed in detail, including medication safety, potential side effects, and all patient questions. We confirmed understanding and comfort with the plan. Follow-up instructions were established, including contacting the office for any concerns, returning if symptoms worsen, persist, or new symptoms develop, and precautions for potential emergency department visits.

## 2023-12-21 NOTE — Assessment & Plan Note (Signed)
 MAJOR ISSUE: Chronic dizziness is worsening, marked by vertigo, photopsia, and balance issues.  Possibly worsened by discontinuing B12. Previous ENT and neurosurgery evaluations were inconclusive. Differential diagnosis includes craniocervical junction syndrome and brainstem compression from cervical spine issues. He has a history of cerebrovascular accident and cervical spine surgery by Dr. Ellery Guthrie. Current hypothesis suggests brainstem compression, possibly worsened by a small foramen magnum. Acupuncture was discussed as a low-risk alternative treatment. Urgent referral to neurologist Dr. Madie Schilling is needed for further evaluation of dizziness and potential brainstem compression. Order a repeat MRI to assess changes in brainstem compression. Continue B12 supplementation for nerve health and initiate gabapentin for potential relief of nerve-related dizziness. Coordinate with Dr. Ellery Guthrie regarding potential brainstem compression and previous cervical spine surgery outcomes. Consider acupuncture as a complementary therapy.

## 2023-12-26 DIAGNOSIS — M9901 Segmental and somatic dysfunction of cervical region: Secondary | ICD-10-CM | POA: Diagnosis not present

## 2023-12-27 ENCOUNTER — Telehealth: Payer: Self-pay | Admitting: Internal Medicine

## 2023-12-27 NOTE — Telephone Encounter (Signed)
 Copied from CRM [id number]. Topic: Referral - Status >> Dec 27, 2023 12:23 PM Viola F wrote: Reason for CRM: Patient spouse Heron called to follow up on referral that was suppose to placed 12/21/23 to [facility] on [address], when she called the office they told her the referral was not sent. Please call her once referral is in so she can schedule.  [phone] (M)

## 2023-12-29 ENCOUNTER — Telehealth: Payer: Self-pay

## 2023-12-29 NOTE — Telephone Encounter (Signed)
 Copied from CRM [id number]. Topic: Referral - Question >> Dec 28, 2023  1:16 PM Mercedes MATSU wrote: Reason for CRM: Patients wife called in stating that the referral that was first sent to [facility] states they only deal with audio and not swallowing. Patient is requesting a call back with an alternative. Patient states she wants to go back to where they were going prior with Neurology. Patient can be reached at [phone].  Please Advise.  Please sent back to your care team

## 2023-12-29 NOTE — Telephone Encounter (Signed)
 Copied from CRM [id number]. Topic: Referral - Question >> Dec 28, 2023  1:16 PM Mercedes MATSU wrote: Reason for CRM: Patients wife called in stating that the referral that was first sent to [facility] states they only deal with audio and not swallowing. Patient is requesting a call back with an alternative. Patient states she wants to go back to where they were going prior with Neurology. Patient can be reached at [phone].  A message was placed in the patients referral to referral coordinator so patient can be scheduled appropriately at the correct office they requested. Awaiting on a reply from [facility] to give patient more information if needed.

## 2024-01-02 ENCOUNTER — Other Ambulatory Visit: Payer: Self-pay

## 2024-01-02 DIAGNOSIS — R1310 Dysphagia, unspecified: Secondary | ICD-10-CM

## 2024-01-02 DIAGNOSIS — R1319 Other dysphagia: Secondary | ICD-10-CM

## 2024-01-02 NOTE — Telephone Encounter (Signed)
Patient's wife returned call. Requests to be called.

## 2024-01-02 NOTE — Telephone Encounter (Signed)
 Tried to call pt wife back no answer left her voicemail to call office back about referral.

## 2024-01-02 NOTE — Telephone Encounter (Signed)
 Spoke with pt wife via phone placed referral to [facility] for speech therapy also stated she would talk to dr more once they come in next week.

## 2024-01-11 ENCOUNTER — Ambulatory Visit: Admitting: Internal Medicine

## 2024-01-11 ENCOUNTER — Encounter: Payer: Self-pay | Admitting: Internal Medicine

## 2024-01-11 VITALS — BP 110/62 | HR 72 | Temp 98.0°F | Ht 70.0 in | Wt 172.0 lb

## 2024-01-11 DIAGNOSIS — G43E09 Chronic migraine with aura, not intractable, without status migrainosus: Secondary | ICD-10-CM

## 2024-01-11 DIAGNOSIS — R42 Dizziness and giddiness: Secondary | ICD-10-CM | POA: Diagnosis not present

## 2024-01-11 DIAGNOSIS — R270 Ataxia, unspecified: Secondary | ICD-10-CM

## 2024-01-11 MED ORDER — NURTEC 75 MG PO TBDP: 1.0000 | ORAL_TABLET | Freq: Every day | ORAL | 2 refills | Status: DC

## 2024-01-11 NOTE — Therapy (Deleted)
 OUTPATIENT SPEECH LANGUAGE PATHOLOGY SWALLOW EVALUATION   Patient Name: Joshua Higgins MRN: [id number] DOB:1946/02/23, 78 y.o., male Today's Date: 01/11/2024  PCP: Dr. Bernardino Cone REFERRING PROVIDER: Dr. Bernardino Cone  END OF SESSION:   Past Medical History:  Diagnosis Date   Allergies    Anemia    low iron   Arthritis    Aspiration into airway 09/20/2023   Aspiration into airway (J69.0) #Resolved #StudyNegative   STATUS: 11/29/2023: Modified barium swallow study definitively showed NO ASPIRATION during swallowing evaluation. Patient demonstrates vestibular penetration only, which does not constitute true aspiration into the airway. Previous clinical suspicion of aspiration was based on symptoms but not confirmed on objective testing.   CLINICAL CONTE   Cancer (HCC)    skin cancer on face   Cataracts, bilateral    Coronary artery disease    CABG - 2008   Diabetes mellitus without complication (HCC)    type II   GERD (gastroesophageal reflux disease)    Hepatitis    History of sleep apnea    Hyperlipidemia    Hypertension    pt states he does not have HTN, takes Atenolol  for migraines   Hypothyroidism    Migraine headache with aura    takes Atenolol    Sleep apnea    does not use cpap   TIA (transient ischemic attack) 07/2022   Past Surgical History:  Procedure Laterality Date   BACK SURGERY     CARDIAC SURGERY     Bypass   CATARACT EXTRACTION, BILATERAL Bilateral 2018   Dr. Roz   COLONOSCOPY     CORONARY ANGIOPLASTY  2001   CORONARY ARTERY BYPASS GRAFT     ELBOW SURGERY Left    due to damaged nerve   KNEE SURGERY Right    arthroscopic   LUMBAR LAMINECTOMY/DECOMPRESSION MICRODISCECTOMY Right 08/04/2014   Procedure: Right Lumbar four-five Microdiskectomy;  Surgeon: Victory Gens, MD;  Location: [facility];  Service: Neurosurgery;  Laterality: Right;  Right L4-5 Microdiskectomy   NECK SURGERY  07/2023   POSTERIOR CERVICAL LAMINECTOMY N/A 08/03/2023    Procedure: Posterior Cervical Laminectomy and Decompresson Cervical One-Cervical Two;  Surgeon: Gens Victory, MD;  Location: [facility] OR;  Service: Neurosurgery;  Laterality: N/A;  C3   SURGERY SCROTAL / TESTICULAR     as a child/teenager   Patient Active Problem List   Diagnosis Date Noted   Carotid atherosclerosis 09/20/2023   Emphysema lung (HCC) 09/20/2023   Dizziness 09/20/2023   Migraine with aura and with status migrainosus, not intractable 09/20/2023   Dysphagia causing pulmonary aspiration with swallowing 09/20/2023   Peripheral arterial disease (HCC) 09/20/2023   Cervical myelopathy (HCC) 08/03/2023   Prediabetes 03/12/2023   TIA (transient ischemic attack) 07/18/2022   COVID-19 (01/29/2021) 02/02/2021   B12 deficiency 03/26/2020   Macrocytic anemia 09/20/2019   History of Mohs micrographic surgery for skin cancer 09/19/2019   Former smoker 05/14/2018   CKD stage 2 due to type 2 diabetes mellitus (HCC) 02/02/2018   Atherosclerosis of abdominal aorta (HCC) by CXR in 2017  02/12/2016   Hypothyroidism 11/12/2015   CAD s/p CABG 09/29/2015   Vitamin D  deficiency 05/27/2013   [name] w/Stage 2 CKD (HCC)    Hyperlipidemia associated with type 2 diabetes mellitus (HCC)    Hypertension    GERD (gastroesophageal reflux disease)    IBS (irritable bowel syndrome)     ONSET DATE: 01/02/24 referral date   REFERRING DIAG:  R13.10 (ICD-10-CM) - Dysphagia, unspecified type  R13.19 (ICD-10-CM) -  Dysphagia causing pulmonary aspiration with swallowing    THERAPY DIAG:  No diagnosis found.  Rationale for Evaluation and Treatment: Rehabilitation  SUBJECTIVE:   SUBJECTIVE STATEMENT: *** Pt accompanied by: {accompnied:27141}  PERTINENT HISTORY: 78 y.o. male who was referred for OP MBS. He has been seen by OP SLP due to complaints of significant coughing with meals. His wife states this has been ongoing for 1 year. Posterior decompression of the arch of C1 and undercutting of C2 08/03/23,  however they state coughing was prior to surgery. No h/o pulmonary issues, no pna. Denies globus, phlegm, hoarse voice, sensation food is going down the wrong way. Denies weight loss and sx of LPR. Does endorse heartburn on occasion and reports occasional regurgitation. Does not take pepcid  consistently. He coughs with most meals, some coughing episodes become severe.   PAIN:  Are you having pain? {OPRCPAIN:27236}  FALLS: Has patient fallen in last 6 months?  {DOEQJOOD:74320}  LIVING ENVIRONMENT: Lives with: {OPRC lives with:25569::lives with their family} Lives in: {Lives in:25570}  PLOF:  Level of assistance: {DOEEONQ:74326} Employment: {SLPemployment:25674}  PATIENT GOALS: ***  OBJECTIVE:  Note: Objective measures were completed at Evaluation unless otherwise noted. OBJECTIVE:   DIAGNOSTIC FINDINGS: ***  INSTRUMENTAL SWALLOW STUDY FINDINGS (MBSS) 11/29/2023 Pt presents with a mild pharyngeal dysphagia per the DIGEST scoring system (see below).  There appear to be flowing anterior osteophytes along cervical vertebrae (this was not confirmed by radiologist and cervical imaging from Jan 2025 could not be accessed) which intermittently interfere with full epiglottic inversion.  Oral phase was WNL.  Swallow was often triggered at the level of the pyriforms for thinner viscosities, leading to penetration of thin (PAS 4) and nectars (PAS 2).  Despite taxing the swallow with large, sequential boluses, there was no aspiration during the study.  Pharyngeal residue was mild.  13 mm pill lodged in the valleculae, requiring liquid wash and then puree to transit through UES.    1) Cervical vertebrae and narrowing of pharyngeal space appear to be having intermittent impact on epiglottic closure and likely contributed to lodging of pill in that space.  Recommend that pills >13 mm (pt was able to visualize pill size after study) be broken or crushed.     2) Pt should avoid mixing liquid and solid  consistencies and swallowing them simultaneously.  He should take smaller sips and avoid large, sequential swallows. While no aspiration was observed on MBS, frequency of penetration and description of coughing with meals suggests likely intermittent aspiration  He would benefit from f/u therapy to address timing of the swallow, since liquids tended to spill into larynx before its onset.    DIGEST Swallow Severity Rating*             Safety: 1             Efficiency:1             Overall Pharyngeal Swallow Severity: 1- mild  COGNITION: Overall cognitive status: {cognition:24006} Areas of impairment:  {cognitiveimpairmentslp:27409} Functional deficits: ***  SUBJECTIVE DYSPHAGIA REPORTS:  Date of onset: *** Reported symptoms: {dysphagia symptoms:29766}  Current diet: {slpdiet:27196}  Co-morbid voice changes: {yes/no:20286}  FACTORS WHICH MAY INCREASE RISK OF ADVERSE EVENT IN PRESENCE OF ASPIRATION:  General health: {CSE general health:29764}  Risk factors: {CSE risk factors:29763}    ORAL MOTOR EXAMINATION: Overall status: {OMESLP2:27645} Comments: ***  CLINICAL SWALLOW ASSESSMENT:   Dentition: {CSE dentition:29769} Vocal quality at baseline: {VQL:27192} Patient directly observed with POs: {POobserved:27199} Feeding: {slp feeding:27200}  Liquids provided by: {SLPliquids:27201} [employer] Protocol: {Pass/Fail (Optional):210140017} Oral phase signs and symptoms: {SLPoralphase:27202} Pharyngeal phase signs and symptoms: {SLPpharyngealphase:27203}  PATIENT REPORTED OUTCOME MEASURES (PROM): {SLPPROM:27095}                                                                                                                             TREATMENT DATE: ***   PATIENT EDUCATION: Education details: *** Person educated: {Person educated:25204} Education method: {Education Method:25205} Education comprehension: {Education Comprehension:25206}   ASSESSMENT:  CLINICAL  IMPRESSION: Patient is a *** y.o. *** who was seen today for ***.   OBJECTIVE IMPAIRMENTS: include {SLPOBJIMP:27107}. These impairments are limiting patient from {SLPLIMIT:27108}. Factors affecting potential to achieve goals and functional outcome are {SLP potential:25450}. Patient will benefit from skilled SLP services to address above impairments and improve overall function.  REHAB POTENTIAL: {rehabpotential:25112}   GOALS: Goals reviewed with patient? {yes/no:20286}  SHORT TERM GOALS: Target date: ***  *** Baseline: Goal status: INITIAL  2.  *** Baseline:  Goal status: INITIAL  3.  *** Baseline:  Goal status: INITIAL  4.  *** Baseline:  Goal status: INITIAL  5.  *** Baseline:  Goal status: INITIAL  6.  *** Baseline:  Goal status: INITIAL  LONG TERM GOALS: Target date: ***  *** Baseline:  Goal status: INITIAL  2.  *** Baseline:  Goal status: INITIAL  3.  *** Baseline:  Goal status: INITIAL  4.  *** Baseline:  Goal status: INITIAL  5.  *** Baseline:  Goal status: INITIAL  6.  *** Baseline:  Goal status: INITIAL  PLAN:  SLP FREQUENCY: {rehab frequency:25116}  SLP DURATION: {rehab duration:25117}  PLANNED INTERVENTIONS: {SLP treatment/interventions:25449}    Harlene LITTIE Ned, CCC-SLP 01/11/2024, 4:16 PM

## 2024-01-11 NOTE — Assessment & Plan Note (Signed)
 I think this is multifactorial but mostly due to spinal compression and may not be operative is not a lot of neck damage and spinal compression and concussions that are contributing to the balance problem I do want a neurologist to fully evaluate and get an opinion to help decide what else can be done as this is the biggest risk factor he has for fall and serious complication.  I advised the patient to put up guardrails and and grab rails around his house and not be on ladders and limit his activity to manage risk in the meantime

## 2024-01-11 NOTE — Addendum Note (Signed)
 Addended by: Syrus Nakama G on: 01/11/2024 08:10 PM   Modules accepted: Level of Service

## 2024-01-11 NOTE — Progress Notes (Signed)
 ==============================  [facility]: [phone]   -- Medical Office Visit --  Patient: Joshua Higgins      Age: 78 y.o.       Sex:  male  Date:   01/11/2024 Today's Healthcare Provider: Bernardino KANDICE Cone, MD  ==============================   Chief Complaint: Follow up Neuropathy (Pt states referral speech has not heard anything from them was sent to audiology to start with he needs speech resent referral still have not heard anything. Also needs new referral for two different places today.) and Dizziness (Still having a lot of dizziness spells.)  Discussed the use of AI scribe software for clinical note transcription with the patient, who gave verbal consent to proceed.  History of Present Illness 78 year old gentleman presents with wife in follow-up about numerous issues including dizziness neuropathy headache difficulty swallowing.   Main concern is dizziness not neuropathy.  We don't have ordered MRI yet from last visit. His balance remains poor and he stays dizzy especially with neck extensions.  He says he saw another physicina that would not touch his neck.  We haven't heard from referral to neurology yet.  Apparently he is established with [facility] already per referral coordinator; we tried to have him walk today and he was very unsteady on his feet.  He reports he has been taking the B12 consistently but has not noticed a difference.  He was trying the gabapentin  by made him to medicine added so he cannot tolerate it and he quit so he still has a lot of neuropathic pain in his legs that he is waiting to see neurology about as well.  Regarding the balance problem he denies having any falls and he denies any issues with chest pain shortness of breath or other since seeing us  last.  Regarding his swallowing he denies choking on any food or taking any food down the wrong pipe since last visit.  He reports speech therapy did  call and it is coming up soon.  Plan is for modified barium swallow as he mostly has oropharyngeal dysphagia thin liquids.  Would like to hold off just a little longer for the swallow study before deciding about ear nose and throat who he is seen before for vertigo inconclusive. He does complain of ongoing headaches and ocular migraine/visual auras for which he takes atenolol  but it is not helping and he does not take the atenolol  for anything else he reports.  He does not have any respiratory symptoms at this time  Last visit we talked about the dizziness as: Previous ENT and neurosurgery evaluations were inconclusive. Differential diagnosis includes craniocervical junction syndrome and brainstem compression from cervical spine issues. He has a history of cerebrovascular accident and cervical spine surgery by Dr. Colon. Current hypothesis suggests brainstem compression, possibly worsened by a small foramen magnum. Acupuncture was discussed as a low-risk alternative treatment. Urgent referral to neurologist Dr. Asberry Silvius was placed for further evaluation of dizziness and potential brainstem compression. Ordered a repeat MRI to assess changes in brainstem compression. Continue B12 supplementation for nerve health and initiate gabapentin  for potential relief of nerve-related dizziness. Coordinate with Dr. Colon regarding potential brainstem compression and previous cervical spine surgery outcomes. Consider acupuncture as a complementary therapy.   Background Reviewed: Problem List: has [name] w/Stage 2 CKD (HCC); Hyperlipidemia associated with type 2 diabetes mellitus (HCC); Hypertension; GERD (gastroesophageal reflux disease); IBS (irritable bowel syndrome); Vitamin D  deficiency; CAD s/p CABG; Hypothyroidism; Atherosclerosis of abdominal  aorta (HCC) by CXR in 2017 ; CKD stage 2 due to type 2 diabetes mellitus (HCC); Former smoker; History of Mohs micrographic surgery for skin cancer; Macrocytic anemia;  B12 deficiency; COVID-19 (01/29/2021); TIA (transient ischemic attack); Prediabetes; Cervical myelopathy (HCC); Carotid atherosclerosis; Emphysema lung (HCC); Dizziness; Migraine with aura and with status migrainosus, not intractable; Dysphagia causing pulmonary aspiration with swallowing; and Peripheral arterial disease (HCC) on their problem list. Past Medical History:  has a past medical history of Allergies, Anemia, Arthritis, Aspiration into airway (09/20/2023), Cancer (HCC), Cataracts, bilateral, Coronary artery disease, Diabetes mellitus without complication (HCC), GERD (gastroesophageal reflux disease), Hepatitis, History of sleep apnea, Hyperlipidemia, Hypertension, Hypothyroidism, Migraine headache with aura, Sleep apnea, and TIA (transient ischemic attack) (07/2022). Past Surgical History:   has a past surgical history that includes Cardiac surgery; Knee surgery (Right); Back surgery; Coronary angioplasty (2001); Coronary artery bypass graft; Surgery scrotal / testicular; Elbow surgery (Left); Colonoscopy; Lumbar laminectomy/decompression microdiscectomy (Right, 08/04/2014); Cataract extraction, bilateral (Bilateral, 2018); Posterior cervical laminectomy (N/A, 08/03/2023); and Neck surgery (07/2023). Social History:   reports that he quit smoking about 50 years ago. His smoking use included cigarettes. He has never used smokeless tobacco. He reports that he does not drink alcohol and does not use drugs. Family History:  family history includes Arthritis in his mother; CAD in his brother and sister; Diabetes in his maternal grandmother and paternal grandmother; Heart attack in his brother and father; Heart disease in his brother, brother, brother, brother, father, sister, and sister; Hypertension in his brother; Ulcers in his mother. Allergies:  is allergic to gemfibrozil , ace inhibitors, and lipitor [atorvastatin].   Medication Reconciliation: Current Outpatient Medications on File Prior to Visit   Medication Sig   Blood Glucose Monitoring Suppl DEVI Use kit to check blood sugar once a day   Cholecalciferol  125 MCG (5000 UT) TABS Take 1 tablet (5,000 Units total) by mouth at bedtime.   clopidogrel  (PLAVIX ) 75 MG tablet Take 1 tablet (75 mg total) by mouth daily.   Cyanocobalamin  (B-12) 1000 MCG SUBL Place 1 tablet under the tongue once a week.   ezetimibe  (ZETIA ) 10 MG tablet Take 1 tablet (10 mg total) by mouth daily.   glucose blood (CONTOUR NEXT TEST) test strip USE TO TEST BLOOD SUGAR ONCE DAILY   Lancets MISC Use to test blood sugar once daily   levothyroxine  (SYNTHROID ) 50 MCG tablet TAKE 1 TABLET BY MOUTH DAILY ON AN EMPTY STOMACH WITH ONLY WATER FOR 30 MINUTES & NO ANTACID MEDS, CALCIUM  OR MAGNESIUM FOR 4 HOURS & AVOID BIOTIN   metFORMIN  (GLUCOPHAGE -XR) 500 MG 24 hr tablet TAKE 2 TABLETS BY MOUTH 2 TIMES PER DAY WITH MEALS FOR DIABETES   Multiple Vitamin (MULTIVITAMIN) tablet Take 1 tablet by mouth at bedtime.    Omega-3 Fatty Acids (FISH OIL ) 1000 MG CAPS Take 1 capsule (1,000 mg total) by mouth at bedtime.   rosuvastatin  (CRESTOR ) 10 MG tablet Take 1 tablet (10 mg total) by mouth See admin instructions. Take one tablet by mouth twice a week Sundays and Wednesdays   empagliflozin  (JARDIANCE ) 10 MG TABS tablet Take 1 tablet (10 mg total) by mouth daily before breakfast. (Patient not taking: Reported on 11/08/2023)   famotidine  (PEPCID ) 20 MG tablet TAKE 1 TABLET BY MOUTH TWICE A DAY (Patient taking differently: Take 20 mg by mouth daily as needed for indigestion.)   gabapentin  (NEURONTIN ) 300 MG capsule Take 1 capsule (300 mg total) by mouth 3 (three) times daily.   No current facility-administered  medications on file prior to visit.   Medications Discontinued During This Encounter  Medication Reason   atenolol  (TENORMIN ) 50 MG tablet      Physical Exam:    01/11/2024    9:40 AM 12/21/2023    9:48 AM 09/20/2023    1:47 PM  Vitals with BMI  Height 5' 10 5' 10 5' 10   Weight 172 lbs 173 lbs 10 oz 170 lbs 6 oz  BMI 24.68 24.91 24.45  Systolic 110 122 881  Diastolic 62 62 72  Pulse 72 74 65  Vital signs reviewed.  Nursing notes reviewed. Weight trend reviewed. Physical Exam General Appearance:  No acute distress appreciable.   Well-groomed, healthy-appearing male.  Well proportioned with no abnormal fat distribution.  Good muscle tone. Pulmonary:  Normal work of breathing at rest, no respiratory distress apparent. SpO2: 98 %  Musculoskeletal: All extremities are intact.  Neurological:  Awake, alert, oriented, and engaged.  No obvious focal neurological deficits or cognitive impairments.  Sensorium seems unclouded.   Speech is clear and coherent with logical content. Psychiatric:  Appropriate mood, pleasant and cooperative demeanor, thoughtful and engaged during the exam Physical Exam Nearly Falls with attempted heel to toe walk.   Results:    05/08/2023    9:53 PM 05/08/2023    9:47 PM 01/18/2023   11:09 AM 10/06/2022    9:51 PM  PHQ 2/9 Scores  PHQ - 2 Score 0 0 0 0    No results found for any visits on 01/11/24. Office Visit on 09/20/2023  Component Date Value Ref Range Status   Sodium 09/20/2023 140  135 - 145 mEq/L Final   Potassium 09/20/2023 4.5  3.5 - 5.1 mEq/L Final   Chloride 09/20/2023 101  96 - 112 mEq/L Final   CO2 09/20/2023 31  19 - 32 mEq/L Final   Glucose, Bld 09/20/2023 92  70 - 99 mg/dL Final   BUN 96/80/7974 13  6 - 23 mg/dL Final   Creatinine, Ser 09/20/2023 1.03  0.40 - 1.50 mg/dL Final   Total Bilirubin 09/20/2023 0.4  0.2 - 1.2 mg/dL Final   Alkaline Phosphatase 09/20/2023 46  39 - 117 U/L Final   AST 09/20/2023 19  0 - 37 U/L Final   ALT 09/20/2023 14  0 - 53 U/L Final   Total Protein 09/20/2023 7.2  6.0 - 8.3 g/dL Final   Albumin 96/80/7974 4.5  3.5 - 5.2 g/dL Final   GFR 96/80/7974 68.91  >60.00 mL/min Final   Calcium  09/20/2023 9.7  8.4 - 10.5 mg/dL Final   TSH W/REFLEX TO FT4 09/20/2023 1.62  0.40 - 4.50 mIU/L  Final   Microalb, Ur 09/20/2023 0.7  0.0 - 1.9 mg/dL Final   Creatinine,U 96/80/7974 55.8  mg/dL Final   Microalb Creat Ratio 09/20/2023 Unable to calculate  0.0 - 30.0 mg/g Final   QUESTION/PROBLEM 09/20/2023    Final   UNCLEAR ORDER: 09/20/2023 833 NO LONGER AVAILABLE   Final   SPECIMEN(S) SUBMITTED 09/20/2023 LAV   Final  Admission on 08/03/2023, Discharged on 08/04/2023  Component Date Value Ref Range Status   Glucose-Capillary 08/03/2023 105 (H)  70 - 99 mg/dL Final   Sodium 98/69/7974 140  135 - 145 mmol/L Final   Potassium 08/03/2023 4.2  3.5 - 5.1 mmol/L Final   Chloride 08/03/2023 103  98 - 111 mmol/L Final   BUN 08/03/2023 18  8 - 23 mg/dL Final   Creatinine, Ser 08/03/2023 1.20  0.61 - 1.24  mg/dL Final   Glucose, Bld 98/69/7974 105 (H)  70 - 99 mg/dL Final   Calcium , Ion 08/03/2023 1.17  1.15 - 1.40 mmol/L Final   TCO2 08/03/2023 27  22 - 32 mmol/L Final   Hemoglobin 08/03/2023 12.9 (L)  13.0 - 17.0 g/dL Final   HCT 98/69/7974 38.0 (L)  39.0 - 52.0 % Final   Glucose-Capillary 08/03/2023 141 (H)  70 - 99 mg/dL Final   Glucose-Capillary 08/03/2023 240 (H)  70 - 99 mg/dL Final   Comment 1 98/69/7974 Notify RN   Final   Comment 2 08/03/2023 Document in Chart   Final   Glucose-Capillary 08/04/2023 115 (H)  70 - 99 mg/dL Final   Comment 1 98/68/7974 Notify RN   Final   Comment 2 08/04/2023 Document in Chart   Final  Hospital Outpatient Visit on 07/27/2023  Component Date Value Ref Range Status   Glucose-Capillary 07/27/2023 135 (H)  70 - 99 mg/dL Final   MRSA, PCR 98/76/7974 NEGATIVE  NEGATIVE Final   Staphylococcus aureus 07/27/2023 NEGATIVE  NEGATIVE Final   Hgb A1c MFr Bld 07/27/2023 6.2 (H)  4.8 - 5.6 % Final   Mean Plasma Glucose 07/27/2023 131.24  mg/dL Final   Sodium 98/76/7974 140  135 - 145 mmol/L Final   Potassium 07/27/2023 5.8 (H)  3.5 - 5.1 mmol/L Final   Chloride 07/27/2023 102  98 - 111 mmol/L Final   CO2 07/27/2023 28  22 - 32 mmol/L Final   Glucose, Bld  07/27/2023 137 (H)  70 - 99 mg/dL Final   BUN 98/76/7974 14  8 - 23 mg/dL Final   Creatinine, Ser 07/27/2023 1.19  0.61 - 1.24 mg/dL Final   Calcium  07/27/2023 10.0  8.9 - 10.3 mg/dL Final   Total Protein 98/76/7974 7.3  6.5 - 8.1 g/dL Final   Albumin 98/76/7974 4.2  3.5 - 5.0 g/dL Final   AST 98/76/7974 21  15 - 41 U/L Final   ALT 07/27/2023 17  0 - 44 U/L Final   Alkaline Phosphatase 07/27/2023 56  38 - 126 U/L Final   Total Bilirubin 07/27/2023 0.9  0.0 - 1.2 mg/dL Final   GFR, Estimated 07/27/2023 >60  >60 mL/min Final   Anion gap 07/27/2023 10  5 - 15 Final   WBC 07/27/2023 6.0  4.0 - 10.5 K/uL Final   RBC 07/27/2023 3.97 (L)  4.22 - 5.81 MIL/uL Final   Hemoglobin 07/27/2023 13.5  13.0 - 17.0 g/dL Final   HCT 98/76/7974 41.5  39.0 - 52.0 % Final   MCV 07/27/2023 104.5 (H)  80.0 - 100.0 fL Final   MCH 07/27/2023 34.0  26.0 - 34.0 pg Final   MCHC 07/27/2023 32.5  30.0 - 36.0 g/dL Final   RDW 98/76/7974 12.4  11.5 - 15.5 % Final   Platelets 07/27/2023 186  150 - 400 K/uL Final   nRBC 07/27/2023 0.0  0.0 - 0.2 % Final  Orders Only on 07/12/2023  Component Date Value Ref Range Status   Fecal Occult Blood, POC 07/12/2023 Negative  Negative Final   Card #2 Fecal Occult Blod, POC 07/12/2023 Negative   Final   Card #3 Fecal Occult Blood, POC 07/12/2023 Negative   Final  Abstract on 05/24/2023  Component Date Value Ref Range Status   HM Diabetic Eye Exam 05/15/2023 No Retinopathy  No Retinopathy Final  Office Visit on 05/08/2023  Component Date Value Ref Range Status   Color, Urine 05/08/2023 YELLOW  YELLOW Final   APPearance  05/08/2023 CLEAR  CLEAR Final   Specific Gravity, Urine 05/08/2023 1.015  1.001 - 1.035 Final   pH 05/08/2023 6.0  5.0 - 8.0 Final   Glucose, UA 05/08/2023 NEGATIVE  NEGATIVE Final   Bilirubin Urine 05/08/2023 NEGATIVE  NEGATIVE Final   Ketones, ur 05/08/2023 NEGATIVE  NEGATIVE Final   Hgb urine dipstick 05/08/2023 NEGATIVE  NEGATIVE Final   Protein, ur  05/08/2023 NEGATIVE  NEGATIVE Final   Nitrite 05/08/2023 NEGATIVE  NEGATIVE Final   Leukocytes,Ua 05/08/2023 NEGATIVE  NEGATIVE Final   Creatinine, Urine 05/08/2023 95  20 - 320 mg/dL Final   Microalb, Ur 88/95/7975 0.7  mg/dL Final   Microalb Creat Ratio 05/08/2023 7  <30 mg/g creat Final   Vitamin B-12 05/08/2023 1,273 (H)  200 - 1,100 pg/mL Final   PSA 05/08/2023 0.79  < OR = 4.00 ng/mL Final   WBC 05/08/2023 5.5  3.8 - 10.8 Thousand/uL Final   RBC 05/08/2023 3.63 (L)  4.20 - 5.80 Million/uL Final   Hemoglobin 05/08/2023 12.2 (L)  13.2 - 17.1 g/dL Final   HCT 88/95/7975 37.5 (L)  38.5 - 50.0 % Final   MCV 05/08/2023 103.3 (H)  80.0 - 100.0 fL Final   MCH 05/08/2023 33.6 (H)  27.0 - 33.0 pg Final   MCHC 05/08/2023 32.5  32.0 - 36.0 g/dL Final   RDW 88/95/7975 12.0  11.0 - 15.0 % Final   Platelets 05/08/2023 176  140 - 400 Thousand/uL Final   MPV 05/08/2023 9.4  7.5 - 12.5 fL Final   Neutro Abs 05/08/2023 2,855  1,500 - 7,800 cells/uL Final   Absolute Lymphocytes 05/08/2023 1,925  850 - 3,900 cells/uL Final   Absolute Monocytes 05/08/2023 506  200 - 950 cells/uL Final   Eosinophils Absolute 05/08/2023 193  15 - 500 cells/uL Final   Basophils Absolute 05/08/2023 22  0 - 200 cells/uL Final   Neutrophils Relative % 05/08/2023 51.9  % Final   Total Lymphocyte 05/08/2023 35.0  % Final   Monocytes Relative 05/08/2023 9.2  % Final   Eosinophils Relative 05/08/2023 3.5  % Final   Basophils Relative 05/08/2023 0.4  % Final   Glucose, Bld 05/08/2023 138  65 - 139 mg/dL Final   BUN 88/95/7975 17  7 - 25 mg/dL Final   Creat 88/95/7975 0.97  0.70 - 1.28 mg/dL Final   eGFR 88/95/7975 79  > OR = 60 mL/min/1.49m2 Final   BUN/Creatinine Ratio 05/08/2023 SEE NOTE:  6 - 22 (calc) Final   Sodium 05/08/2023 140  135 - 146 mmol/L Final   Potassium 05/08/2023 4.7  3.5 - 5.3 mmol/L Final   Chloride 05/08/2023 102  98 - 110 mmol/L Final   CO2 05/08/2023 31  20 - 32 mmol/L Final   Calcium  05/08/2023 9.7   8.6 - 10.3 mg/dL Final   Total Protein 88/95/7975 7.0  6.1 - 8.1 g/dL Final   Albumin 88/95/7975 4.3  3.6 - 5.1 g/dL Final   Globulin 88/95/7975 2.7  1.9 - 3.7 g/dL (calc) Final   AG Ratio 05/08/2023 1.6  1.0 - 2.5 (calc) Final   Total Bilirubin 05/08/2023 0.6  0.2 - 1.2 mg/dL Final   Alkaline phosphatase (APISO) 05/08/2023 62  35 - 144 U/L Final   AST 05/08/2023 15  10 - 35 U/L Final   ALT 05/08/2023 11  9 - 46 U/L Final   Magnesium 05/08/2023 1.9  1.5 - 2.5 mg/dL Final   Cholesterol 88/95/7975 125  <200 mg/dL Final  HDL 05/08/2023 48  > OR = 40 mg/dL Final   Triglycerides 88/95/7975 163 (H)  <150 mg/dL Final   LDL Cholesterol (Calc) 05/08/2023 53  mg/dL (calc) Final   Total CHOL/HDL Ratio 05/08/2023 2.6  <4.9 (calc) Final   Non-HDL Cholesterol (Calc) 05/08/2023 77  <130 mg/dL (calc) Final   TSH 88/95/7975 2.12  0.40 - 4.50 mIU/L Final   Hgb A1c MFr Bld 05/08/2023 6.4 (H)  <5.7 % of total Hgb Final   Mean Plasma Glucose 05/08/2023 137  mg/dL Final   eAG (mmol/L) 88/95/7975 7.6  mmol/L Final   Insulin  05/08/2023 49.3 (H)  uIU/mL Final   Vit D, 25-Hydroxy 05/08/2023 76  30 - 100 ng/mL Final  Office Visit on 04/10/2023  Component Date Value Ref Range Status   Glucose, Bld 04/10/2023 99  65 - 99 mg/dL Final   BUN 89/92/7975 20  7 - 25 mg/dL Final   Creat 89/92/7975 0.94  0.70 - 1.28 mg/dL Final   eGFR 89/92/7975 82  > OR = 60 mL/min/1.22m2 Final   BUN/Creatinine Ratio 04/10/2023 SEE NOTE:  6 - 22 (calc) Final   Sodium 04/10/2023 140  135 - 146 mmol/L Final   Potassium 04/10/2023 4.7  3.5 - 5.3 mmol/L Final   Chloride 04/10/2023 104  98 - 110 mmol/L Final   CO2 04/10/2023 25  20 - 32 mmol/L Final   Calcium  04/10/2023 9.7  8.6 - 10.3 mg/dL Final  Office Visit on 03/24/2023  Component Date Value Ref Range Status   WBC 03/24/2023 5.6  3.8 - 10.8 Thousand/uL Final   RBC 03/24/2023 3.82 (L)  4.20 - 5.80 Million/uL Final   Hemoglobin 03/24/2023 12.5 (L)  13.2 - 17.1 g/dL Final   HCT  90/79/7975 39.8  38.5 - 50.0 % Final   MCV 03/24/2023 104.2 (H)  80.0 - 100.0 fL Final   MCH 03/24/2023 32.7  27.0 - 33.0 pg Final   MCHC 03/24/2023 31.4 (L)  32.0 - 36.0 g/dL Final   RDW 90/79/7975 11.6  11.0 - 15.0 % Final   Platelets 03/24/2023 288  140 - 400 Thousand/uL Final   MPV 03/24/2023 9.4  7.5 - 12.5 fL Final   Neutro Abs 03/24/2023 2,990  1,500 - 7,800 cells/uL Final   Lymphs Abs 03/24/2023 1,865  850 - 3,900 cells/uL Final   Absolute Monocytes 03/24/2023 543  200 - 950 cells/uL Final   Eosinophils Absolute 03/24/2023 162  15 - 500 cells/uL Final   Basophils Absolute 03/24/2023 39  0 - 200 cells/uL Final   Neutrophils Relative % 03/24/2023 53.4  % Final   Total Lymphocyte 03/24/2023 33.3  % Final   Monocytes Relative 03/24/2023 9.7  % Final   Eosinophils Relative 03/24/2023 2.9  % Final   Basophils Relative 03/24/2023 0.7  % Final   Glucose, Bld 03/24/2023 158 (H)  65 - 99 mg/dL Final   BUN 90/79/7975 20  7 - 25 mg/dL Final   Creat 90/79/7975 1.05  0.70 - 1.28 mg/dL Final   eGFR 90/79/7975 72  > OR = 60 mL/min/1.81m2 Final   BUN/Creatinine Ratio 03/24/2023 SEE NOTE:  6 - 22 (calc) Final   Sodium 03/24/2023 139  135 - 146 mmol/L Final   Potassium 03/24/2023 5.7 (H)  3.5 - 5.3 mmol/L Final   Chloride 03/24/2023 101  98 - 110 mmol/L Final   CO2 03/24/2023 28  20 - 32 mmol/L Final   Calcium  03/24/2023 10.0  8.6 - 10.3 mg/dL Final   Total  Protein 03/24/2023 7.0  6.1 - 8.1 g/dL Final   Albumin 90/79/7975 4.2  3.6 - 5.1 g/dL Final   Globulin 90/79/7975 2.8  1.9 - 3.7 g/dL (calc) Final   AG Ratio 03/24/2023 1.5  1.0 - 2.5 (calc) Final   Total Bilirubin 03/24/2023 0.5  0.2 - 1.2 mg/dL Final   Alkaline phosphatase (APISO) 03/24/2023 78  35 - 144 U/L Final   AST 03/24/2023 13  10 - 35 U/L Final   ALT 03/24/2023 12  9 - 46 U/L Final  Admission on 03/16/2023, Discharged on 03/16/2023  Component Date Value Ref Range Status   Lactic Acid, Venous 03/16/2023 2.6 (HH)  0.5 - 1.9  mmol/L Final   Comment 03/16/2023 NOTIFIED PHYSICIAN   Final   Sodium 03/16/2023 137  135 - 145 mmol/L Final   Potassium 03/16/2023 3.8  3.5 - 5.1 mmol/L Final   Chloride 03/16/2023 102  98 - 111 mmol/L Final   CO2 03/16/2023 24  22 - 32 mmol/L Final   Glucose, Bld 03/16/2023 203 (H)  70 - 99 mg/dL Final   BUN 90/87/7975 15  8 - 23 mg/dL Final   Creatinine, Ser 03/16/2023 1.13  0.61 - 1.24 mg/dL Final   Calcium  03/16/2023 8.7 (L)  8.9 - 10.3 mg/dL Final   Total Protein 90/87/7975 6.2 (L)  6.5 - 8.1 g/dL Final   Albumin 90/87/7975 3.5  3.5 - 5.0 g/dL Final   AST 90/87/7975 27  15 - 41 U/L Final   ALT 03/16/2023 20  0 - 44 U/L Final   Alkaline Phosphatase 03/16/2023 44  38 - 126 U/L Final   Total Bilirubin 03/16/2023 0.8  0.3 - 1.2 mg/dL Final   GFR, Estimated 03/16/2023 >60  >60 mL/min Final   Anion gap 03/16/2023 11  5 - 15 Final   WBC 03/16/2023 9.3  4.0 - 10.5 K/uL Final   RBC 03/16/2023 3.34 (L)  4.22 - 5.81 MIL/uL Final   Hemoglobin 03/16/2023 11.5 (L)  13.0 - 17.0 g/dL Final   HCT 90/87/7975 34.8 (L)  39.0 - 52.0 % Final   MCV 03/16/2023 104.2 (H)  80.0 - 100.0 fL Final   MCH 03/16/2023 34.4 (H)  26.0 - 34.0 pg Final   MCHC 03/16/2023 33.0  30.0 - 36.0 g/dL Final   RDW 90/87/7975 12.6  11.5 - 15.5 % Final   Platelets 03/16/2023 146 (L)  150 - 400 K/uL Final   nRBC 03/16/2023 0.0  0.0 - 0.2 % Final   Neutrophils Relative % 03/16/2023 81  % Final   Neutro Abs 03/16/2023 7.6  1.7 - 7.7 K/uL Final   Lymphocytes Relative 03/16/2023 11  % Final   Lymphs Abs 03/16/2023 1.0  0.7 - 4.0 K/uL Final   Monocytes Relative 03/16/2023 7  % Final   Monocytes Absolute 03/16/2023 0.6  0.1 - 1.0 K/uL Final   Eosinophils Relative 03/16/2023 0  % Final   Eosinophils Absolute 03/16/2023 0.0  0.0 - 0.5 K/uL Final   Basophils Relative 03/16/2023 0  % Final   Basophils Absolute 03/16/2023 0.0  0.0 - 0.1 K/uL Final   Immature Granulocytes 03/16/2023 1  % Final   Abs Immature Granulocytes 03/16/2023  0.06  0.00 - 0.07 K/uL Final   Prothrombin Time 03/16/2023 15.2  11.4 - 15.2 seconds Final   INR 03/16/2023 1.2  0.8 - 1.2 Final   aPTT 03/16/2023 25  24 - 36 seconds Final   Specimen Description 03/16/2023 BLOOD SITE NOT SPECIFIED  Final   Special Requests 03/16/2023 BOTTLES DRAWN AEROBIC AND ANAEROBIC Blood Culture adequate volume   Final   Culture 03/16/2023    Final                   Value:NO GROWTH 5 DAYS Performed at [facility], [address], [place], [place] [address]    Report Status 03/16/2023 03/21/2023 FINAL   Final   Specimen Description 03/16/2023 BLOOD RIGHT ARM   Final   Special Requests 03/16/2023 BOTTLES DRAWN AEROBIC AND ANAEROBIC Blood Culture adequate volume   Final   Culture 03/16/2023    Final                   Value:NO GROWTH 5 DAYS Performed at [facility], [address], [place], [place] [address]    Report Status 03/16/2023 03/21/2023 FINAL   Final   Specimen Source 03/16/2023 URINE, CLEAN CATCH   Final   Color, Urine 03/16/2023 YELLOW  YELLOW Final   APPearance 03/16/2023 CLEAR  CLEAR Final   Specific Gravity, Urine 03/16/2023 1.016  1.005 - 1.030 Final   pH 03/16/2023 5.0  5.0 - 8.0 Final   Glucose, UA 03/16/2023 150 (A)  NEGATIVE mg/dL Final   Hgb urine dipstick 03/16/2023 NEGATIVE  NEGATIVE Final   Bilirubin Urine 03/16/2023 NEGATIVE  NEGATIVE Final   Ketones, ur 03/16/2023 NEGATIVE  NEGATIVE mg/dL Final   Protein, ur 90/87/7975 NEGATIVE  NEGATIVE mg/dL Final   Nitrite 90/87/7975 NEGATIVE  NEGATIVE Final   Leukocytes,Ua 03/16/2023 NEGATIVE  NEGATIVE Final   RBC / HPF 03/16/2023 6-10  0 - 5 RBC/hpf Final   WBC, UA 03/16/2023 0-5  0 - 5 WBC/hpf Final   Bacteria, UA 03/16/2023 RARE (A)  NONE SEEN Final   Squamous Epithelial / HPF 03/16/2023 0-5  0 - 5 /HPF Final   Mucus 03/16/2023 PRESENT   Final   SARS Coronavirus 2 by RT PCR 03/16/2023 NEGATIVE  NEGATIVE Final   Lactic Acid, Venous 03/16/2023 1.7  0.5 - 1.9 mmol/L Final  Office  Visit on 01/18/2023  Component Date Value Ref Range Status   WBC 01/18/2023 6.1  3.8 - 10.8 Thousand/uL Final   RBC 01/18/2023 4.05 (L)  4.20 - 5.80 Million/uL Final   Hemoglobin 01/18/2023 13.7  13.2 - 17.1 g/dL Final   HCT 92/82/7975 41.9  38.5 - 50.0 % Final   MCV 01/18/2023 103.5 (H)  80.0 - 100.0 fL Final   MCH 01/18/2023 33.8 (H)  27.0 - 33.0 pg Final   MCHC 01/18/2023 32.7  32.0 - 36.0 g/dL Final   RDW 92/82/7975 11.8  11.0 - 15.0 % Final   Platelets 01/18/2023 187  140 - 400 Thousand/uL Final   MPV 01/18/2023 9.6  7.5 - 12.5 fL Final   Neutro Abs 01/18/2023 3,373  1,500 - 7,800 cells/uL Final   Lymphs Abs 01/18/2023 1,964  850 - 3,900 cells/uL Final   Absolute Monocytes 01/18/2023 604  200 - 950 cells/uL Final   Eosinophils Absolute 01/18/2023 128  15 - 500 cells/uL Final   Basophils Absolute 01/18/2023 31  0 - 200 cells/uL Final   Neutrophils Relative % 01/18/2023 55.3  % Final   Total Lymphocyte 01/18/2023 32.2  % Final   Monocytes Relative 01/18/2023 9.9  % Final   Eosinophils Relative 01/18/2023 2.1  % Final   Basophils Relative 01/18/2023 0.5  % Final   Glucose, Bld 01/18/2023 213 (H)  65 - 99 mg/dL Final  BUN 01/18/2023 17  7 - 25 mg/dL Final   Creat 92/82/7975 1.16  0.70 - 1.28 mg/dL Final   eGFR 92/82/7975 64  > OR = 60 mL/min/1.63m2 Final   BUN/Creatinine Ratio 01/18/2023 SEE NOTE:  6 - 22 (calc) Final   Sodium 01/18/2023 140  135 - 146 mmol/L Final   Potassium 01/18/2023 5.4 (H)  3.5 - 5.3 mmol/L Final   Chloride 01/18/2023 101  98 - 110 mmol/L Final   CO2 01/18/2023 27  20 - 32 mmol/L Final   Calcium  01/18/2023 10.4 (H)  8.6 - 10.3 mg/dL Final   Total Protein 92/82/7975 7.8  6.1 - 8.1 g/dL Final   Albumin 92/82/7975 4.9  3.6 - 5.1 g/dL Final   Globulin 92/82/7975 2.9  1.9 - 3.7 g/dL (calc) Final   AG Ratio 01/18/2023 1.7  1.0 - 2.5 (calc) Final   Total Bilirubin 01/18/2023 0.5  0.2 - 1.2 mg/dL Final   Alkaline phosphatase (APISO) 01/18/2023 62  35 - 144 U/L  Final   AST 01/18/2023 18  10 - 35 U/L Final   ALT 01/18/2023 19  9 - 46 U/L Final   Cholesterol 01/18/2023 151  <200 mg/dL Final   HDL 92/82/7975 55  > OR = 40 mg/dL Final   Triglycerides 92/82/7975 162 (H)  <150 mg/dL Final   LDL Cholesterol (Calc) 01/18/2023 72  mg/dL (calc) Final   Total CHOL/HDL Ratio 01/18/2023 2.7  <4.9 (calc) Final   Non-HDL Cholesterol (Calc) 01/18/2023 96  <130 mg/dL (calc) Final   TSH 92/82/7975 2.08  0.40 - 4.50 mIU/L Final   Hgb A1c MFr Bld 01/18/2023 6.4 (H)  <5.7 % of total Hgb Final   Iron 01/18/2023 149  50 - 180 mcg/dL Final   TIBC 92/82/7975 349  250 - 425 mcg/dL (calc) Final   %SAT 92/82/7975 43  20 - 48 % (calc) Final   Ferritin 01/18/2023 46  24 - 380 ng/mL Final   Vitamin B-12 01/18/2023 >2,000 (H)  200 - 1,100 pg/mL Final  There may be more visits with results that are not included.  No image results found. [facility] SPEECH PATH Result Date: 11/29/2023 CLINICAL DATA:  Dysphagia. Intermittent cough after swallowing. Request for modified barium swallow with speech pathology. EXAM: MODIFIED BARIUM SWALLOW TECHNIQUE: Different consistencies of barium were administered orally to the patient by the Speech Pathologist. Imaging of the pharynx was performed in the lateral projection. This exam was performed by Franky Rusk PA-C , and was supervised and interpreted by Dr. Dasie Hamburg. FLUOROSCOPY: Radiation Exposure Index (as provided by the fluoroscopic device): 33.06 mGy Kerma COMPARISON:  None Available. FINDINGS: Vestibular Penetration: Consistent penetration to the level of the cords seen with thin barium. Brief vestibular penetration seen with nectar thick consistency. Aspiration:  None seen. Other:  None. IMPRESSION: Penetration without aspiration. Please refer to the Speech Pathologist's report for complete details and recommendations. Electronically Signed   By: Dasie Hamburg M.D.   On: 11/29/2023 13:16         ASSESSMENT & PLAN    Assessment & Plan Dizziness I think this is multifactorial but mostly due to spinal compression and may not be operative is not a lot of neck damage and spinal compression and concussions that are contributing to the balance problem I do want a neurologist to fully evaluate and get an opinion to help decide what else can be done as this is the biggest risk factor he has for fall and  serious complication.  I advised the patient to put up guardrails and and grab rails around his house and not be on ladders and limit his activity to manage risk in the meantime Ataxia Continue with B12 get neurology input physical therapy Chronic migraine with aura without status migrainosus, not intractable Will stop atenolol  as he does not have a strong indication for is not working and it could be a fall risk we will try to get Nurtec or an alternative treatment   ORDER ASSOCIATIONS  #   DIAGNOSIS / CONDITION ICD-10 ENCOUNTER ORDER     ICD-10-CM   1. Dizziness  R42 AMB Referral [facility] Care Management    2. Ataxia  R27.0 Ambulatory referral to Neurology    AMB Referral [facility] Care Management    3. Chronic migraine with aura without status migrainosus, not intractable  G43.E09 Rimegepant Sulfate (NURTEC) 75 MG TBDP    AMB Referral [facility] Care Management      Meds ordered this encounter  Medications   Rimegepant Sulfate (NURTEC) 75 MG TBDP    Sig: Take 1 tablet (75 mg total) by mouth daily at 6 (six) AM.    Dispense:  16 tablet    Refill:  2    Orders Placed This Encounter  Procedures   Ambulatory referral to Neurology    Referral Priority:   Urgent    Referral Type:   Consultation    Referral Reason:   Specialty Services Required    Requested Specialty:   Neurology    Number of Visits Requested:   1   AMB Referral [facility] Care Management    Referral Priority:   Routine    Referral Type:   Consultation    Referral Reason:   Care Coordination    Number of Visits Requested:   1   ED Discharge Orders           Ordered    Rimegepant Sulfate (NURTEC) 75 MG TBDP  Daily        01/11/24 1053    Ambulatory referral to Neurology        01/11/24 1053    AMB Referral [facility] Care Management       Comments: Cervical spinal disease with ataxia   01/11/24 1055              This document was synthesized by artificial intelligence (Abridge) using HIPAA-compliant recording of the clinical interaction;   We discussed the use of AI scribe software for clinical note transcription with the patient, who gave verbal consent to proceed. additional Info: This encounter employed state-of-the-art, real-time, collaborative documentation. The patient actively reviewed and assisted in updating their electronic medical record on a shared screen, ensuring transparency and facilitating joint problem-solving for the problem list, overview, and plan. This approach promotes accurate, informed care. The treatment plan was discussed and reviewed in detail, including medication safety, potential side effects, and all patient questions. We confirmed understanding and comfort with the plan. Follow-up instructions were established, including contacting the office for any concerns, returning if symptoms worsen, persist, or new symptoms develop, and precautions for potential emergency department visits.

## 2024-01-12 ENCOUNTER — Ambulatory Visit: Attending: Internal Medicine | Admitting: Speech Pathology

## 2024-01-12 ENCOUNTER — Other Ambulatory Visit (HOSPITAL_COMMUNITY): Payer: Self-pay

## 2024-01-12 ENCOUNTER — Telehealth: Payer: Self-pay

## 2024-01-12 DIAGNOSIS — R1310 Dysphagia, unspecified: Secondary | ICD-10-CM | POA: Insufficient documentation

## 2024-01-12 DIAGNOSIS — R1319 Other dysphagia: Secondary | ICD-10-CM | POA: Insufficient documentation

## 2024-01-12 DIAGNOSIS — R1312 Dysphagia, oropharyngeal phase: Secondary | ICD-10-CM | POA: Diagnosis not present

## 2024-01-12 NOTE — Telephone Encounter (Signed)
 Pharmacy Patient Advocate Encounter   Received notification from Onbase that prior authorization for Nurtec 75MG  dispersible tablets is required/requested.   Insurance verification completed.   The patient is insured through [facility] .   Per test claim: PA required; PA submitted to above mentioned insurance via CoverMyMeds Key/confirmation #/EOC [id number] Status is pending

## 2024-01-12 NOTE — Patient Instructions (Signed)
 SWALLOWING EXERCISES Effortful Swallows - Squeeze hard with the muscles in your neck while you swallow your  saliva or a sip of water - Repeat 50 times, 2 times a day, and use whenever you eat or drink  Shaker Exercise - head lift - Lie flat on your back in your bed or on a couch without pillows - Raise your head and look at your feet  - KEEP YOUR SHOULDERS DOWN - HOLD FOR 45 to 60 SECONDS, then lower your head back down - Repeat 3 times, 2-3 times a day  [employer] -  swallow as tight as you  for 5 seconds - Start to swallow, and keep your Adam's apple up by squeezing tight with the muscles of the throat - Hold the squeeze for 5-7 seconds and then relax - Repeat 20 times, 2 times a day

## 2024-01-12 NOTE — Therapy (Signed)
 OUTPATIENT SPEECH LANGUAGE PATHOLOGY SWALLOW TREATMENT (RECERT)   Patient Name: Joshua Higgins MRN: [id number] DOB:01-13-1946, 78 y.o., male Today's Date: 01/12/2024  PCP: Jesus Bernardino MATSU, MD REFERRING PROVIDER: Jesus Bernardino MATSU, MD  END OF SESSION:  End of Session - 01/12/24 0750     Visit Number 2    Number of Visits 8    Date for SLP Re-Evaluation 03/08/24   extedned d/t scheduling   SLP Start Time 0800    SLP Stop Time  0846    SLP Time Calculation (min) 46 min    Activity Tolerance Patient tolerated treatment well          Past Medical History:  Diagnosis Date   Allergies    Anemia    low iron   Arthritis    Aspiration into airway 09/20/2023   Aspiration into airway (J69.0) #Resolved #StudyNegative   STATUS: 11/29/2023: Modified barium swallow study definitively showed NO ASPIRATION during swallowing evaluation. Patient demonstrates vestibular penetration only, which does not constitute true aspiration into the airway. Previous clinical suspicion of aspiration was based on symptoms but not confirmed on objective testing.   CLINICAL CONTE   Cancer (HCC)    skin cancer on face   Cataracts, bilateral    Coronary artery disease    CABG - 2008   Diabetes mellitus without complication (HCC)    type II   GERD (gastroesophageal reflux disease)    Hepatitis    History of sleep apnea    Hyperlipidemia    Hypertension    pt states he does not have HTN, takes Atenolol  for migraines   Hypothyroidism    Migraine headache with aura    takes Atenolol    Sleep apnea    does not use cpap   TIA (transient ischemic attack) 07/2022   Past Surgical History:  Procedure Laterality Date   BACK SURGERY     CARDIAC SURGERY     Bypass   CATARACT EXTRACTION, BILATERAL Bilateral 2018   Dr. Roz   COLONOSCOPY     CORONARY ANGIOPLASTY  2001   CORONARY ARTERY BYPASS GRAFT     ELBOW SURGERY Left    due to damaged nerve   KNEE SURGERY Right    arthroscopic   LUMBAR  LAMINECTOMY/DECOMPRESSION MICRODISCECTOMY Right 08/04/2014   Procedure: Right Lumbar four-five Microdiskectomy;  Surgeon: Victory Gens, MD;  Location: [facility];  Service: Neurosurgery;  Laterality: Right;  Right L4-5 Microdiskectomy   NECK SURGERY  07/2023   POSTERIOR CERVICAL LAMINECTOMY N/A 08/03/2023   Procedure: Posterior Cervical Laminectomy and Decompresson Cervical One-Cervical Two;  Surgeon: Gens Victory, MD;  Location: [facility] OR;  Service: Neurosurgery;  Laterality: N/A;  C3   SURGERY SCROTAL / TESTICULAR     as a child/teenager   Patient Active Problem List   Diagnosis Date Noted   Carotid atherosclerosis 09/20/2023   Emphysema lung (HCC) 09/20/2023   Dizziness 09/20/2023   Migraine with aura and with status migrainosus, not intractable 09/20/2023   Dysphagia causing pulmonary aspiration with swallowing 09/20/2023   Peripheral arterial disease (HCC) 09/20/2023   Cervical myelopathy (HCC) 08/03/2023   Prediabetes 03/12/2023   TIA (transient ischemic attack) 07/18/2022   COVID-19 (01/29/2021) 02/02/2021   B12 deficiency 03/26/2020   Macrocytic anemia 09/20/2019   History of Mohs micrographic surgery for skin cancer 09/19/2019   Former smoker 05/14/2018   CKD stage 2 due to type 2 diabetes mellitus (HCC) 02/02/2018   Atherosclerosis of abdominal aorta (HCC) by CXR in 2017  02/12/2016   Hypothyroidism 11/12/2015   CAD s/p CABG 09/29/2015   Vitamin D  deficiency 05/27/2013   [name] w/Stage 2 CKD (HCC)    Hyperlipidemia associated with type 2 diabetes mellitus (HCC)    Hypertension    GERD (gastroesophageal reflux disease)    IBS (irritable bowel syndrome)     ONSET DATE: 09/20/2023 (referral date)  REFERRING DIAG: R13.19 (ICD-10-CM) - Dysphagia causing pulmonary aspiration with swallowing  THERAPY DIAG:  Dysphagia, oropharyngeal phase  Rationale for Evaluation and Treatment: Rehabilitation  SUBJECTIVE:   SUBJECTIVE STATEMENT: He coughs almost every time he  eats Pt accompanied by: significant other Barbara  PERTINENT HISTORY: 78 y.o. reports coughing with meals for 1 year. S/p Posterior decompression of the arch of C1 and undercutting of C2 08/03/23, however they state coughing was prior to surgery.PMH + GERD - he takes antacid as needed for heartburn, does not take famotidine  consistently. Denies coughing when laying down or in the morning, denies excessive phlegm  PAIN:  Are you having pain? No  FALLS: Has patient fallen in last 6 months?  No  LIVING ENVIRONMENT: Lives with: lives with their spouse Lives in: House/apartment  PLOF:  Level of assistance: Independent with ADLs, Independent with IADLs Employment: Retired  PATIENT GOALS: To not cough when he eats  OBJECTIVE:  Note: Objective measures were completed at Evaluation unless otherwise noted.  INSTRUMENTAL SWALLOW STUDY FINDINGS (MBSS) 11/29/2023 Pt presents with a mild pharyngeal dysphagia per the DIGEST scoring system (see below).  There appear to be flowing anterior osteophytes along cervical vertebrae (this was not confirmed by radiologist and cervical imaging from Jan 2025 could not be accessed) which intermittently interfere with full epiglottic inversion.  Oral phase was WNL.  Swallow was often triggered at the level of the pyriforms for thinner viscosities, leading to penetration of thin (PAS 4) and nectars (PAS 2).  Despite taxing the swallow with large, sequential boluses, there was no aspiration during the study.  Pharyngeal residue was mild.  13 mm pill lodged in the valleculae, requiring liquid wash and then puree to transit through UES.     1) Cervical vertebrae and narrowing of pharyngeal space appear to be having intermittent impact on epiglottic closure and likely contributed to lodging of pill in that space.  Recommend that pills >13 mm (pt was able to visualize pill size after study) be broken or crushed.     2) Pt should avoid mixing liquid and solid consistencies  and swallowing them simultaneously.  He should take smaller sips and avoid large, sequential swallows. While no aspiration was observed on MBS, frequency of penetration and description of coughing with meals suggests likely intermittent aspiration  He would benefit from f/u therapy to address timing of the swallow, since liquids tended to spill into larynx before its onset.    DIGEST Swallow Severity Rating*             Safety: 1             Efficiency:1             Overall Pharyngeal Swallow Severity: 1- mild  PATIENT REPORTED OUTCOME MEASURES (PROM): EAT-10: 5 Question Patient's Response  My swallowing problem has caused me to lose weight 0  2.  My swallowing problem interferes with my ability to go out to meals 0  3.  Swallowing liquids takes extra effort 0  4.  Swallowing solids takes extra effort 2  5.  Swallowing pills takes extra effort 0  6.  Swallowing is painful 0  7.  The pleasure of eating is affected by my swallowing 0  8.  When I swallow food sticks in my throat 0  9.  I cough when I eat 3  10.  Swallowing is stressful  0  0= No problem 4= Severe problem                                                                                                                           TREATMENT DATE:   01/12/24: MBS results indicate reduced laryngeal elevation, anterior hyoid excursion, laryngeal vestibule closure, UES distension. Introduced effortful swallow, mendelsohn, and shaker exercises to address deficits. Pt completes x20 effortful swallows, x4 mendelsohn's, demonstrates mod shaker with min-A for accuracy. Generated HEP with pt verbalizing understanding of SLP recommendations.   11/08/23: Eval only - will need MBSS results to generate  HEP and tdetermine diet modifications and swallow precautions   PATIENT EDUCATION: Education details: Recommend MBSS Person educated: Patient and Spouse Education method: Explanation and Handouts Education comprehension: verbalized  understanding   ASSESSMENT:  CLINICAL IMPRESSION: Patient is a 78 y.o. male who was seen today for complaints of significant coughing with meals. His wife states this has been ongoing for 1 year. Posterior decompression of the arch of C1 and undercutting of C2 08/03/23, however they state coughing was prior to surgery.No h/o pulmonary issues. Denies globus, phlegm, hoarse voice, sensation food is going down the wrong way. Denies weight loss and sx of LPR. Does endorse heartburn on occasion. Does not take pepcid  consistently. He coughs with most meals, some coughing episodes become severe. Coughing and mild tearing after [facility] Test. Recommend MBSS and consider esophagram. Pending  results of MBSS, recommend skilled ST to maximize safety of swallow.   OBJECTIVE IMPAIRMENTS: include dysphagia. These impairments are limiting patient from safety when swallowing. Factors affecting potential to achieve goals and functional outcome are n/a. Patient will benefit from skilled SLP services to address above impairments and improve overall function.  REHAB POTENTIAL: Good   GOALS: Goals reviewed with patient? No - awaiting results of MBSS   LONG TERM GOALS: Target date: 01/17/24 - extended due to await MBSS and scheduling  Pt will complete HEP for dysphagia with mod I Baseline:  Goal status: INITIAL  2.  Pt will follow diet modifications with mod I Baseline:  Goal status: INITIAL  3.  Pt will follow swallow precautions with mod I Baseline:  Goal status: INITIAL  4.  Pt will verbalize 3 reflux precautions with occasional min A Baseline:  Goal status: INITIAL   PLAN:  SLP FREQUENCY: 2x/week  SLP DURATION: 4 weeks  PLANNED INTERVENTIONS: Aspiration precaution training, Pharyngeal strengthening exercises, Diet toleration management , Environmental controls, Trials of upgraded texture/liquids, Internal/external aids, SLP instruction and feedback, Compensatory strategies, Patient/family  education, and [id number] Treatment of swallowing function, MBSS    Harlene LITTIE Ned, CCC-SLP 01/12/2024, 8:46 AM

## 2024-01-12 NOTE — Telephone Encounter (Signed)
 Pharmacy Patient Advocate Encounter  Received notification from [facility] that Prior Authorization for Nurtec 75MG  dispersible tablets has been APPROVED from 12/29/23 to FURTHER NOTICE   PA #/Case ID/Reference #: [id number]

## 2024-01-19 ENCOUNTER — Telehealth: Payer: Self-pay

## 2024-01-19 NOTE — Progress Notes (Signed)
 Care Guide Pharmacy Note  01/19/2024 Name: Joshua Higgins MRN: [id number] DOB: Jun 06, 1946  Referred By: Jesus Bernardino MATSU, MD Reason for referral: Complex Care Management (Outreach to schedule with Pharm d )   Joshua Higgins is a 78 y.o. year old male who is a primary care patient of Jesus Bernardino MATSU, MD.  Joshua Higgins was referred to the pharmacist for assistance related to: [name]  Successful contact was made with the patient to discuss pharmacy services including being ready for the pharmacist to call at least 5 minutes before the scheduled appointment time and to have medication bottles and any blood pressure readings ready for review. The patient agreed to meet with the pharmacist via telephone visit on (date/time).01/23/2024  Jeoffrey Buffalo , RMA     [place]  [facility] [address] Guide  Direct Dial: [phone]  Website: [email or web address]

## 2024-01-23 ENCOUNTER — Other Ambulatory Visit: Payer: Self-pay | Admitting: Pharmacist

## 2024-01-23 ENCOUNTER — Ambulatory Visit: Payer: Medicare Other | Admitting: Nurse Practitioner

## 2024-01-23 NOTE — Progress Notes (Signed)
 01/23/2024 Name: Joshua Higgins MRN: [id number] DOB: 02-20-1946  Chief Complaint  Patient presents with   Medication Management   Medication Adherence   Diabetes   Hyperlipidemia    Joshua Higgins is a 78 y.o. year old male who presented for a telephone visit. His wife also was on phone call.    They were referred to the pharmacist by their PCP for assistance in managing diabetes, medication access, and complex medication management.    Subjective:  Care Team: Primary Care Provider: Jesus Bernardino MATSU, MD ; Next Scheduled Visit: 02/14/2024   Medication Access/Adherence  Current Pharmacy:  [facility] [phone] - [place], [place] - [address] [place] [address] Phone: [phone] Fax: [phone]   Patient reports affordability concerns with their medications: Yes  - Nurtec cost was > $800. Patient has $590 deductible to meet and then Nurtec is tier 5 which for his [facility] plan means his copay is 25% of medication cost.  Patient reports access/transportation concerns to their pharmacy: No  Patient reports adherence concerns with their medications:  Yes   - not taking Jardiance  due to cost He also reports he took Gabapentin  300mg  once but it cause his head to spin so he did not take again.     Diabetes:  Current medications: Metformin  Er 500mg  - take 2 tablets twice a day  Prescribed Jardiance  but not taking due to cost.   Medications tried in the past:   Current glucose readings: 100 to 110 - usually only checks in the morning Using contour Next meter; testing 3 or 4 times per week.   Patient denies hypoglycemic s/sx including no dizziness, shakiness, sweating. Patient denies hyperglycemic symptoms including no polyuria, polydipsia, polyphagia, nocturia, neuropathy, blurred vision.  Current medication access support: none   Hyperlipidemia/ASCVD Risk Reduction  Current lipid lowering medications:  rosuvastatin  10mg  2 times per week.   Migraine Headache:  Current therapy - atenolol .  He has been prescribed Nurtec was has not started due to cost.  Patient has significant cardiac history - CAD, TIA, carotid atherosclerosis and PAD. Not s good candidate for triptans which are lower cost medications on his current [facility] plan.   Objective:  Lab Results  Component Value Date   HGBA1C 6.2 (H) 07/27/2023    Lab Results  Component Value Date   CREATININE 1.03 09/20/2023   BUN 13 09/20/2023   NA 140 09/20/2023   K 4.5 09/20/2023   CL 101 09/20/2023   CO2 31 09/20/2023    Lab Results  Component Value Date   CHOL 125 05/08/2023   HDL 48 05/08/2023   LDLCALC 53 05/08/2023   TRIG 163 (H) 05/08/2023   CHOLHDL 2.6 05/08/2023    Medications Reviewed Today     Reviewed by Carla Milling, RPH-CPP (Pharmacist) on 01/23/24 at 1106  Med List Status: <None>   Medication Order Taking? Sig Documenting Provider Last Dose Status Informant  Blood Glucose Monitoring Suppl DEVI [id number]  Use kit to check blood sugar once a day Jeanine Knee, NP  Active Self  Cholecalciferol  125 MCG (5000 UT) TABS [id number] Yes Take 1 tablet (5,000 Units total) by mouth at bedtime. Jesus Bernardino MATSU, MD  Active   clopidogrel  (PLAVIX ) 75 MG tablet [id number] Yes Take 1 tablet (75 mg total) by mouth daily. Jesus Bernardino MATSU, MD  Active   Patient taking differently:   Discontinued 01/23/24 1053 (Entry Error)   Cyanocobalamin  (B-12)  5000 MCG SUBL [id number] Yes Place 1 tablet under the tongue daily. [provider]  Active   empagliflozin  (JARDIANCE ) 10 MG TABS tablet [id number]  Take 1 tablet (10 mg total) by mouth daily before breakfast.  Patient not taking: Reported on 01/23/2024   Jesus Bernardino MATSU, MD  Active   ezetimibe  (ZETIA ) 10 MG tablet [id number] Yes Take 1 tablet (10 mg total) by mouth daily. Jesus Bernardino MATSU, MD  Active   famotidine  (PEPCID ) 20 MG tablet [id number] Yes TAKE 1 TABLET BY MOUTH TWICE  A DAY  Patient taking differently: Take 20 mg by mouth daily as needed for indigestion.   Wilkinson, Dana E, FNP  Active Self  gabapentin  (NEURONTIN ) 300 MG capsule [id number]  Take 1 capsule (300 mg total) by mouth 3 (three) times daily.  Patient not taking: Reported on 01/23/2024   Jesus Bernardino MATSU, MD  Active   glucose blood (CONTOUR NEXT TEST) test strip [id number] Yes USE TO TEST BLOOD SUGAR ONCE DAILY Wilkinson, Dana E, FNP  Active Self  Lancets MISC [id number] Yes Use to test blood sugar once daily Cranford, Tonya, NP  Active Self  levothyroxine  (SYNTHROID ) 50 MCG tablet [id number] Yes TAKE 1 TABLET BY MOUTH DAILY ON AN EMPTY STOMACH WITH ONLY WATER FOR 30 MINUTES & NO ANTACID MEDS, CALCIUM  OR MAGNESIUM FOR 4 HOURS & AVOID BIOTIN Jesus Bernardino MATSU, MD  Active   metFORMIN  (GLUCOPHAGE -XR) 500 MG 24 hr tablet [id number] Yes TAKE 2 TABLETS BY MOUTH 2 TIMES PER DAY WITH MEALS FOR DIABETES Jesus Bernardino MATSU, MD  Active   Multiple Vitamin (MULTIVITAMIN) tablet [id number] Yes Take 1 tablet by mouth at bedtime.  [provider]  Active Self  Omega-3 Fatty Acids (FISH OIL ) 1000 MG CAPS [id number] Yes Take 1 capsule (1,000 mg total) by mouth at bedtime. Jesus Bernardino MATSU, MD  Active   Rimegepant Sulfate (NURTEC) 75 MG TBDP [id number]  Take 1 tablet (75 mg total) by mouth daily at 6 (six) AM.  Patient not taking: Reported on 01/23/2024   Jesus Bernardino MATSU, MD  Active   rosuvastatin  (CRESTOR ) 10 MG tablet [id number] Yes Take 1 tablet (10 mg total) by mouth See admin instructions. Take one tablet by mouth twice a week Sundays and Wednesdays Jesus Bernardino MATSU, MD  Active               Assessment/Plan:   Diabetes:Currently controlled but patient has not been able to afford Jardiance  - Reviewed goal A1c, goal fasting, and goal 2 hour post prandial glucose - Recommend to continue metformin  ER 500mg  - take 2 tablets twice a day  - Recommend to check glucose daily and record. Try to check once per week  after a meal - Screened for Jardiance  medication assistance program - Household income did not qualify but he does meet financial criteria for [place] patient assistance program through [place] [employer] [place]. Will check with PCP about change and then collaborate with provider, CPhT, and patient to pursue assistance  Hyperlipidemia/ASCVD Risk Reduction:Currently controlled. LDL is at goal of < 55 - Recommend to rosuvastatin     Medication Access / Management:  - Checked for patient assistance for Nurtec. Currently no medication assistance program available.  - There is a program for [employer] and [employer] but patient's household income did not qualify.  - Will try to see if his insurance will allow a tier exception.  - Will check with PCP about trial of gabapentin  100mg  since patient was not  able to tolerate 300mg  strength. Would recommend start with 1 capsules at bedtime for 3 days , then twice a day for 3 day then 3 times a day for 3 days.    Follow Up Plan: 2 to 3 weeks  Madelin Ray, PharmD Clinical Pharmacist [facility]  Population Health [phone]

## 2024-01-31 ENCOUNTER — Ambulatory Visit: Payer: Self-pay

## 2024-01-31 DIAGNOSIS — R1312 Dysphagia, oropharyngeal phase: Secondary | ICD-10-CM

## 2024-01-31 DIAGNOSIS — R1319 Other dysphagia: Secondary | ICD-10-CM | POA: Diagnosis not present

## 2024-01-31 DIAGNOSIS — R1310 Dysphagia, unspecified: Secondary | ICD-10-CM | POA: Diagnosis not present

## 2024-01-31 NOTE — Therapy (Signed)
 OUTPATIENT SPEECH LANGUAGE PATHOLOGY SWALLOW TREATMENT (DISCHARGE)   Patient Name: Joshua Higgins MRN: [id number] DOB:1946/01/04, 78 y.o., male Today's Date: 01/31/2024  PCP: Joshua Bernardino MATSU, MD REFERRING PROVIDER: Jesus Bernardino MATSU, MD  END OF SESSION:  End of Session - 01/31/24 0756     Visit Number 3    Number of Visits 8    Date for SLP Re-Evaluation 03/08/24    SLP Start Time 0800    SLP Stop Time  0845    SLP Time Calculation (min) 45 min    Activity Tolerance Patient tolerated treatment well         SPEECH THERAPY DISCHARGE SUMMARY  Visits from Start of Care: 3  Current functional level related to goals / functional outcomes: Reports improved swallow function with HEP completion. Updated HEP. Pt verbalized ability to complete independently at home for next 6 weeks. May return (with new referral) if swallow function not improved.    Remaining deficits: Mild pharyngeal deficits   Education / Equipment: Swallow precautions, swallow exercises, aspiration PNA precautions, HEP   Patient agrees to discharge. Patient goals were met. Patient is being discharged due to being pleased with the current functional level..     Past Medical History:  Diagnosis Date   Allergies    Anemia    low iron   Arthritis    Aspiration into airway 09/20/2023   Aspiration into airway (J69.0) #Resolved #StudyNegative   STATUS: 11/29/2023: Modified barium swallow study definitively showed NO ASPIRATION during swallowing evaluation. Patient demonstrates vestibular penetration only, which does not constitute true aspiration into the airway. Previous clinical suspicion of aspiration was based on symptoms but not confirmed on objective testing.   CLINICAL CONTE   Cancer (HCC)    skin cancer on face   Cataracts, bilateral    Coronary artery disease    CABG - 2008   Diabetes mellitus without complication (HCC)    type II   GERD (gastroesophageal reflux disease)    Hepatitis    History  of sleep apnea    Hyperlipidemia    Hypertension    pt states he does not have HTN, takes Atenolol  for migraines   Hypothyroidism    Migraine headache with aura    takes Atenolol    Sleep apnea    does not use cpap   TIA (transient ischemic attack) 07/2022   Past Surgical History:  Procedure Laterality Date   BACK SURGERY     CARDIAC SURGERY     Bypass   CATARACT EXTRACTION, BILATERAL Bilateral 2018   Dr. Roz   COLONOSCOPY     CORONARY ANGIOPLASTY  2001   CORONARY ARTERY BYPASS GRAFT     ELBOW SURGERY Left    due to damaged nerve   KNEE SURGERY Right    arthroscopic   LUMBAR LAMINECTOMY/DECOMPRESSION MICRODISCECTOMY Right 08/04/2014   Procedure: Right Lumbar four-five Microdiskectomy;  Surgeon: Joshua Gens, MD;  Location: [facility];  Service: Neurosurgery;  Laterality: Right;  Right L4-5 Microdiskectomy   NECK SURGERY  07/2023   POSTERIOR CERVICAL LAMINECTOMY N/A 08/03/2023   Procedure: Posterior Cervical Laminectomy and Decompresson Cervical One-Cervical Two;  Surgeon: Higgins Victory, MD;  Location: [facility] OR;  Service: Neurosurgery;  Laterality: N/A;  C3   SURGERY SCROTAL / TESTICULAR     as a child/teenager   Patient Active Problem List   Diagnosis Date Noted   Carotid atherosclerosis 09/20/2023   Emphysema lung (HCC) 09/20/2023   Dizziness 09/20/2023   Migraine with aura and  with status migrainosus, not intractable 09/20/2023   Dysphagia causing pulmonary aspiration with swallowing 09/20/2023   Peripheral arterial disease (HCC) 09/20/2023   Cervical myelopathy (HCC) 08/03/2023   Prediabetes 03/12/2023   TIA (transient ischemic attack) 07/18/2022   COVID-19 (01/29/2021) 02/02/2021   B12 deficiency 03/26/2020   Macrocytic anemia 09/20/2019   History of Mohs micrographic surgery for skin cancer 09/19/2019   Former smoker 05/14/2018   CKD stage 2 due to type 2 diabetes mellitus (HCC) 02/02/2018   Atherosclerosis of abdominal aorta (HCC) by CXR in 2017  02/12/2016    Hypothyroidism 11/12/2015   CAD s/p CABG 09/29/2015   Vitamin D  deficiency 05/27/2013   [name] w/Stage 2 CKD (HCC)    Hyperlipidemia associated with type 2 diabetes mellitus (HCC)    Hypertension    GERD (gastroesophageal reflux disease)    IBS (irritable bowel syndrome)     ONSET DATE: 09/20/2023 (referral date)  REFERRING DIAG: R13.19 (ICD-10-CM) - Dysphagia causing pulmonary aspiration with swallowing  THERAPY DIAG: Dysphagia, oropharyngeal phase  Rationale for Evaluation and Treatment: Rehabilitation  SUBJECTIVE:   SUBJECTIVE STATEMENT: He thinks it's better  Pt accompanied by: significant other Joshua Higgins  PERTINENT HISTORY: 78 y.o. reports coughing with meals for 1 year. S/p Posterior decompression of the arch of C1 and undercutting of C2 08/03/23, however they state coughing was prior to surgery.PMH + GERD - he takes antacid as needed for heartburn, does not take famotidine  consistently. Denies coughing when laying down or in the morning, denies excessive phlegm  PAIN:  Are you having pain? No  FALLS: Has patient fallen in last 6 months?  No  LIVING ENVIRONMENT: Lives with: lives with their spouse Lives in: House/apartment  PLOF:  Level of assistance: Independent with ADLs, Independent with IADLs Employment: Retired  PATIENT GOALS: To not cough when he eats  OBJECTIVE:  Note: Objective measures were completed at Evaluation unless otherwise noted.  INSTRUMENTAL SWALLOW STUDY FINDINGS (MBSS) 11/29/2023 Pt presents with a mild pharyngeal dysphagia per the DIGEST scoring system (see below).  There appear to be flowing anterior osteophytes along cervical vertebrae (this was not confirmed by radiologist and cervical imaging from Jan 2025 could not be accessed) which intermittently interfere with full epiglottic inversion.  Oral phase was WNL.  Swallow was often triggered at the level of the pyriforms for thinner viscosities, leading to penetration of thin (PAS 4) and  nectars (PAS 2).  Despite taxing the swallow with large, sequential boluses, there was no aspiration during the study.  Pharyngeal residue was mild.  13 mm pill lodged in the valleculae, requiring liquid wash and then puree to transit through UES.     1) Cervical vertebrae and narrowing of pharyngeal space appear to be having intermittent impact on epiglottic closure and likely contributed to lodging of pill in that space.  Recommend that pills >13 mm (pt was able to visualize pill size after study) be broken or crushed.     2) Pt should avoid mixing liquid and solid consistencies and swallowing them simultaneously.  He should take smaller sips and avoid large, sequential swallows. While no aspiration was observed on MBS, frequency of penetration and description of coughing with meals suggests likely intermittent aspiration  He would benefit from f/u therapy to address timing of the swallow, since liquids tended to spill into larynx before its onset.    DIGEST Swallow Severity Rating*             Safety: 1  Efficiency:1             Overall Pharyngeal Swallow Severity: 1- mild  PATIENT REPORTED OUTCOME MEASURES (PROM): EAT-10: 5 Question Patient's Response  My swallowing problem has caused me to lose weight 0  2.  My swallowing problem interferes with my ability to go out to meals 0  3.  Swallowing liquids takes extra effort 0  4.  Swallowing solids takes extra effort 2  5.  Swallowing pills takes extra effort 0  6.  Swallowing is painful 0  7.  The pleasure of eating is affected by my swallowing 0  8.  When I swallow food sticks in my throat 0  9.  I cough when I eat 3  10.  Swallowing is stressful  0  0= No problem 4= Severe problem                                                                                                                           TREATMENT DATE:  01/31/24: Endorsed improvements in swallow function since last ST session. Completing HEP somewhat  sporadically. Re-educated recommended repetitions/sets to maximize swallow function. Endorsed difficulty completing Shaker d/t neck ROM; SLP modified to isokinetic and isometric CTAR with success. Completed isokinetic reps x20, isometric x1 (60 seconds), effortful swallows x25, and Mendelsohn x1. Did not complete remaining reps d/t upcoming breakfast after therapy. Educated 3 pillars of aspiration PNA, with pt reporting thorough oral care and overall active lifestyle. Updated HEP on handout. Denied questions. May request additional ST referral if dysphagia persists. D/C today.   01/12/24: MBS results indicate reduced laryngeal elevation, anterior hyoid excursion, laryngeal vestibule closure, UES distension. Introduced effortful swallow, mendelsohn, and shaker exercises to address deficits. Pt completes x20 effortful swallows, x4 mendelsohn's, demonstrates mod shaker with min-A for accuracy. Generated HEP with pt verbalizing understanding of SLP recommendations.   11/08/23: Eval only - will need MBSS results to generate  HEP and tdetermine diet modifications and swallow precautions   PATIENT EDUCATION: Education details: Recommend MBSS Person educated: Patient and Spouse Education method: Explanation and Handouts Education comprehension: verbalized understanding   ASSESSMENT:  CLINICAL IMPRESSION: Patient is a 78 y.o. male who was seen today for dysphagia. Reported completion of HEP with improved swallow function reported. Updated HEP to maximize swallow function. Pt is pleased with current level of functioning and agreeable to ST discharge this date.   OBJECTIVE IMPAIRMENTS: include dysphagia. These impairments are limiting patient from safety when swallowing. Factors affecting potential to achieve goals and functional outcome are n/a. Patient will benefit from skilled SLP services to address above impairments and improve overall function.  REHAB POTENTIAL: Good   GOALS: Goals reviewed with  patient? YES   LONG TERM GOALS: Target date: 03/08/24 (recert date)  Pt will complete HEP for dysphagia with mod I Baseline:  Goal status: MET  2.  Pt will follow diet modifications with mod I Baseline:  Goal status: MET  3.  Pt will  follow swallow precautions with mod I Baseline:  Goal status: MET  4.  Pt will verbalize 3 reflux precautions with occasional min A Baseline:  Goal status: DEFERRED   PLAN:  SLP FREQUENCY: 2x/week  SLP DURATION: 4 weeks  PLANNED INTERVENTIONS: Aspiration precaution training, Pharyngeal strengthening exercises, Diet toleration management , Environmental controls, Trials of upgraded texture/liquids, Internal/external aids, SLP instruction and feedback, Compensatory strategies, Patient/family education, and [id number] Treatment of swallowing function, MBSS    Comer LILLETTE Louder, CCC-SLP 01/31/2024, 7:57 AM

## 2024-01-31 NOTE — Patient Instructions (Addendum)
 SWALLOWING EXERCISES Effortful Swallows - Squeeze hard with the muscles in your neck while you swallow your  saliva or a sip of water - Repeat 50 times, 2 times a day, and use whenever you eat or drink    CTAR - Chin Tuck Against Resistance **NEW** - Place towel, ball or pool noodle under your chin  - Exercise #1: Pulse up and down 20x 2-3x a day  - Exercise #2: Hold for 60 seconds 2-3x  a day   [employer] -  swallow as tight as you  for 5 seconds - Start to swallow, and keep your Adam's apple up by squeezing tight with the muscles of the throat - Hold the squeeze for 5-7 seconds and then relax - Repeat 20 times, 2 times a day

## 2024-02-14 ENCOUNTER — Ambulatory Visit: Admitting: Internal Medicine

## 2024-02-14 ENCOUNTER — Encounter: Payer: Self-pay | Admitting: Internal Medicine

## 2024-02-14 VITALS — BP 126/60 | HR 59 | Temp 98.2°F | Ht 70.0 in | Wt 173.6 lb

## 2024-02-14 DIAGNOSIS — N182 Chronic kidney disease, stage 2 (mild): Secondary | ICD-10-CM

## 2024-02-14 DIAGNOSIS — Z8673 Personal history of transient ischemic attack (TIA), and cerebral infarction without residual deficits: Secondary | ICD-10-CM

## 2024-02-14 DIAGNOSIS — R200 Anesthesia of skin: Secondary | ICD-10-CM

## 2024-02-14 DIAGNOSIS — E538 Deficiency of other specified B group vitamins: Secondary | ICD-10-CM | POA: Diagnosis not present

## 2024-02-14 DIAGNOSIS — R42 Dizziness and giddiness: Secondary | ICD-10-CM | POA: Insufficient documentation

## 2024-02-14 DIAGNOSIS — I739 Peripheral vascular disease, unspecified: Secondary | ICD-10-CM

## 2024-02-14 DIAGNOSIS — E1169 Type 2 diabetes mellitus with other specified complication: Secondary | ICD-10-CM | POA: Diagnosis not present

## 2024-02-14 DIAGNOSIS — R202 Paresthesia of skin: Secondary | ICD-10-CM | POA: Insufficient documentation

## 2024-02-14 DIAGNOSIS — Z7984 Long term (current) use of oral hypoglycemic drugs: Secondary | ICD-10-CM

## 2024-02-14 DIAGNOSIS — M4810 Ankylosing hyperostosis [Forestier], site unspecified: Secondary | ICD-10-CM | POA: Insufficient documentation

## 2024-02-14 DIAGNOSIS — R278 Other lack of coordination: Secondary | ICD-10-CM | POA: Insufficient documentation

## 2024-02-14 DIAGNOSIS — E039 Hypothyroidism, unspecified: Secondary | ICD-10-CM

## 2024-02-14 DIAGNOSIS — E1122 Type 2 diabetes mellitus with diabetic chronic kidney disease: Secondary | ICD-10-CM | POA: Diagnosis not present

## 2024-02-14 DIAGNOSIS — R270 Ataxia, unspecified: Secondary | ICD-10-CM | POA: Diagnosis not present

## 2024-02-14 DIAGNOSIS — R1319 Other dysphagia: Secondary | ICD-10-CM | POA: Diagnosis not present

## 2024-02-14 DIAGNOSIS — Z9181 History of falling: Secondary | ICD-10-CM | POA: Insufficient documentation

## 2024-02-14 DIAGNOSIS — I6782 Cerebral ischemia: Secondary | ICD-10-CM | POA: Insufficient documentation

## 2024-02-14 DIAGNOSIS — Z7985 Long-term (current) use of injectable non-insulin antidiabetic drugs: Secondary | ICD-10-CM | POA: Diagnosis not present

## 2024-02-14 DIAGNOSIS — G4730 Sleep apnea, unspecified: Secondary | ICD-10-CM | POA: Insufficient documentation

## 2024-02-14 LAB — LIPID PANEL
Cholesterol: 119 mg/dL (ref 0–200)
HDL: 47.8 mg/dL (ref >39.00)
LDL Cholesterol: 50 mg/dL (ref 0–99)
NonHDL: 71.43
Total CHOL/HDL Ratio: 2
Triglycerides: 107 mg/dL (ref 0.0–149.0)
VLDL: 21.4 mg/dL (ref 0.0–40.0)

## 2024-02-14 LAB — CBC WITH DIFFERENTIAL/PLATELET
Basophils Absolute: 0 K/uL (ref 0.0–0.1)
Basophils Relative: 0.4 % (ref 0.0–3.0)
Eosinophils Absolute: 0.1 K/uL (ref 0.0–0.7)
Eosinophils Relative: 2.7 % (ref 0.0–5.0)
HCT: 39.2 % (ref 39.0–52.0)
Hemoglobin: 13 g/dL (ref 13.0–17.0)
Lymphocytes Relative: 37.6 % (ref 12.0–46.0)
Lymphs Abs: 2 K/uL (ref 0.7–4.0)
MCHC: 33.1 g/dL (ref 30.0–36.0)
MCV: 101.3 fl — ABNORMAL HIGH (ref 78.0–100.0)
Monocytes Absolute: 0.4 K/uL (ref 0.1–1.0)
Monocytes Relative: 8.2 % (ref 3.0–12.0)
Neutro Abs: 2.7 K/uL (ref 1.4–7.7)
Neutrophils Relative %: 51.1 % (ref 43.0–77.0)
Platelets: 181 K/uL (ref 150.0–400.0)
RBC: 3.87 Mil/uL — ABNORMAL LOW (ref 4.22–5.81)
RDW: 13.8 % (ref 11.5–15.5)
WBC: 5.3 K/uL (ref 4.0–10.5)

## 2024-02-14 LAB — B12 AND FOLATE PANEL
Folate: >23.4 ng/mL (ref >5.9)
Vitamin B-12: >1500 pg/mL — ABNORMAL HIGH (ref 211–911)

## 2024-02-14 LAB — COMPREHENSIVE METABOLIC PANEL WITH GFR
ALT: 14 U/L (ref 0–53)
AST: 17 U/L (ref 0–37)
Albumin: 4.5 g/dL (ref 3.5–5.2)
Alkaline Phosphatase: 43 U/L (ref 39–117)
BUN: 12 mg/dL (ref 6–23)
CO2: 30 meq/L (ref 19–32)
Calcium: 9.5 mg/dL (ref 8.4–10.5)
Chloride: 103 meq/L (ref 96–112)
Creatinine, Ser: 0.96 mg/dL (ref 0.40–1.50)
GFR: 74.78 mL/min (ref >60.00)
Glucose, Bld: 106 mg/dL — ABNORMAL HIGH (ref 70–99)
Potassium: 4.3 meq/L (ref 3.5–5.1)
Sodium: 141 meq/L (ref 135–145)
Total Bilirubin: 0.7 mg/dL (ref 0.2–1.2)
Total Protein: 7.3 g/dL (ref 6.0–8.3)

## 2024-02-14 LAB — MICROALBUMIN / CREATININE URINE RATIO
Creatinine,U: 57.2 mg/dL
Microalb Creat Ratio: 21.7 mg/g (ref 0.0–30.0)
Microalb, Ur: 1.2 mg/dL (ref 0.0–1.9)

## 2024-02-14 LAB — HEMOGLOBIN A1C: Hgb A1c MFr Bld: 6.7 % — ABNORMAL HIGH (ref 4.6–6.5)

## 2024-02-14 MED ORDER — OZEMPIC (0.25 OR 0.5 MG/DOSE) 2 MG/3ML ~~LOC~~ SOPN: 0.2500 mg | PEN_INJECTOR | SUBCUTANEOUS | 11 refills | Status: DC

## 2024-02-14 NOTE — Assessment & Plan Note (Signed)
 His chronic dysphagia is currently stable. A previous swallow study showed fluid passing the vocal cords but not entering the lungs. There are no current coughing spells with meals or signs of aspiration pneumonia. Exercises have been prescribed to strengthen swallowing muscles. Continue exercises and monitor for any changes in swallowing or coughing during meals.

## 2024-02-14 NOTE — Addendum Note (Signed)
 Addended by: Judene Logue G on: 02/14/2024 12:54 PM   Modules accepted: Level of Service

## 2024-02-14 NOTE — Assessment & Plan Note (Signed)
 He has diffuse idiopathic skeletal hyperostosis (DISH) of the cervical spine, likely contributing to neck stiffness and possibly related to dizziness. No acute neck pain is reported. This condition may be related to past occupational activities involving neck strain. Discuss with the neurologist during referral for dizziness.

## 2024-02-14 NOTE — Assessment & Plan Note (Signed)
 Chronic dizziness and gait disturbance with chronic cerebral ischemia and cognitive impairment    He experiences chronic dizziness and gait disturbance, characterized by vertigo and lightheadedness without a clear trigger. Differential diagnosis includes cervical spine issues and vascular insufficiency to the cerebellum. Neurological involvement is suspected, but Parkinson's disease has been ruled out. Previous MRIs show chronic brain ischemia. Severe dizziness and difficulty walking require support. Dizziness is not always associated with neck movement, suggesting a possible vascular cause. He is currently on Zetia  and rosuvastatin  to manage cholesterol and improve blood flow. Initiation of Ozempic  is planned to further improve cerebral blood flow and manage diabetes. Refer to a new neurologist for further evaluation. Reorder MRI of the brain to assess changes in cerebral ischemia. Continue Zetia  and rosuvastatin . Initiate Ozempic  and monitor for side effects, including diarrhea. Educate on the importance of continuing to seek medical opinions due to the severity of the problem.  He can take a large step and has no tremor, but cannot walk heel to toe.  Dizziness seems Multifactorial- but major factors are peripheral arterial disease with vertebral circulation impairment, chronic sm vessel ischemic disease. Severe spinal stenosis not amenable to further surgery per surgeon, possible ear involvement (occasional ear pain right).  Main question never answered for which we seek neurology input is what is causing it and what can help reduce future fall risk.  Also, we would like neurology opinion on:  He experiences chronic numbness of the left arm following skin cancer surgery, likely due to nerve damage from the procedure. No new interventions are planned unless further evaluation by a neurologist suggests otherwise. Discuss with the neurologist during referral for dizziness and numbness.  He has a deficiency  of B group vitamins, currently under evaluation. He is taking B12 supplements. Previous blood work showed deficiency, but current absorption status is unknown. Check B12 levels as part of routine blood work and continue B12 supplementation as prescribed.

## 2024-02-14 NOTE — Assessment & Plan Note (Signed)
 His type 2 diabetes mellitus is well controlled with metformin , with an A1c of 6.2% six months ago. There are no current issues with blood sugar control. Consider Ozempic  for its potential benefits in improving blood flow and preventing strokes, in addition to its glucose-lowering effects. Continue metformin  as prescribed. Check A1c as part of routine blood work.

## 2024-02-14 NOTE — Assessment & Plan Note (Signed)
 Will order lab testing to guide management.

## 2024-02-14 NOTE — Progress Notes (Signed)
 ==============================  [facility]: [phone]   -- Medical Office Visit --  Patient: Joshua Higgins      Age: 78 y.o.       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  History of Present Illness 78 year old male with chronic dizziness and memory issues who presents with persistent dizziness and difficulty walking.  He has been experiencing persistent dizziness and difficulty walking for several months. The dizziness is described as a sensation of 'head spinning' or 'like you're drunk,' and can be severe enough to require assistance when walking. It does not always present as vertigo, and he sometimes experiences lightheadedness. He has a history of a fall in January or February where he hit the back of his head, but he does not believe this affected his memory. An ENT evaluation ruled out benign positional vertigo.  MRI was ordered but not completed.  07/2022 MRI and CT angiogram for similar showed progression of chronic sm vessel ischemic changes, dish, spinal stenosis.  Leaning over to tie shoes flares the dizziness but turning the neck does not affect it.   He has a history of neck surgery performed by a neurosurgeon, which involved removing part of the bone. He reports no significant neck pain but has numbness in his left arm, which he attributes to previous skin cancer surgeries. A chiropractor advised that his spine is too exposed for certain treatments like acupuncture or dry needling.  He has chronic cerebral ischemia, with previous MRIs showing worsening over the past ten  years. He is currently taking Zetia  and rosuvastatin  to manage this condition. He also has a history of plaque buildup in the brain, and has been told by other doctors that this could affect his memory.  He reports a history of swallowing difficulties, with a previous swallow study indicating fluid passing the vocal cords but not entering the lungs. He has been doing exercises to improve this and reports that his coughing has improved.  He has diabetes, managed with metformin , and reports stable weight around 170-172 pounds. He also mentions a history of vascular issues, with poor blood flow noted in his legs, but he does not experience significant pain or numbness when walking.  He has a history of skin cancer surgeries on his hands and arms, resulting in some numbness in those areas.  Wt Readings from Last 10 Encounters:  02/14/24 173 lb 9.6 oz (78.7 kg)  01/11/24 172 lb (78 kg)  12/21/23 173 lb 9.6 oz (78.7 kg)  09/20/23 170 lb 6.4 oz (77.3 kg)  08/03/23 170 lb (77.1 kg)  07/27/23 175 lb 12.8 oz (79.7 kg)  07/11/23 179 lb (81.2 kg)  05/08/23 174 lb (78.9 kg)  04/10/23 173 lb 12.8 oz (78.8 kg)  03/24/23 173 lb 6.4 oz (78.7 kg)    Background Reviewed: Problem List: has Type II diabetes mellitus with renal manifestations (HCC); Hyperlipidemia associated with type 2 diabetes mellitus (HCC); Hypertension; GERD (gastroesophageal reflux disease); IBS (irritable bowel syndrome); Vitamin D  deficiency; CAD (coronary artery disease) of artery bypass graft; Hypothyroidism; Atherosclerosis of abdominal aorta (HCC) by CXR in 2017 ;  CKD stage 2 due to type 2 diabetes mellitus (HCC); Former smoker; History of Mohs micrographic surgery for skin cancer; Macrocytic anemia; B12 deficiency; History of TIA (transient ischemic attack); Cervical myelopathy (HCC); Carotid atherosclerosis; Emphysema lung (HCC); Vertigo; Migraine with aura and with status migrainosus, not intractable; Dysphagia causing pulmonary  aspiration with swallowing; Peripheral arterial disease (HCC); Sleep apnea; HNP (herniated nucleus pulposus), cervical; Lumbosacral radiculopathy; Neck pain; Chronic cerebral ischemia; DISH (diffuse idiopathic skeletal hyperostosis); At maximum risk for fall; Ataxia; Numbness and tingling in left arm; and Dizziness - Chronic, Persistent, Idiopathic, associated with severe falls and imbalance on their problem list. Past Medical History:  has a past medical history of Allergies, Anemia, Arthritis, Aspiration into airway (09/20/2023), Cancer (HCC), Cataracts, bilateral, Coronary artery disease, COVID-19 (01/29/2021) (02/02/2021), Diabetes mellitus without complication (HCC), GERD (gastroesophageal reflux disease), Hepatitis, History of sleep apnea, Hyperlipidemia, Hypertension, Hypothyroidism, Migraine headache with aura, Prediabetes (03/12/2023), Sleep apnea, and TIA (transient ischemic attack) (07/2022). Past Surgical History:   has a past surgical history that includes Cardiac surgery; Knee surgery (Right); Back surgery; Coronary angioplasty (2001); Coronary artery bypass graft; Surgery scrotal / testicular; Elbow surgery (Left); Colonoscopy; Lumbar laminectomy/decompression microdiscectomy (Right, 08/04/2014); Cataract extraction, bilateral (Bilateral, 2018); Posterior cervical laminectomy (N/A, 08/03/2023); and Neck surgery (07/2023). Social History:   reports that he quit smoking about 50 years ago. His smoking use included cigarettes. He has never used smokeless tobacco. He reports that he does not drink alcohol and does not use drugs. Family History:  family history includes Arthritis in his mother; CAD in his brother and sister; Diabetes in his maternal grandmother and paternal grandmother; Heart attack in his brother and father; Heart disease in his brother, brother, brother, brother, father, sister, and sister; Hypertension in his brother; Ulcers in his mother. Allergies:  is allergic to gemfibrozil , ace  inhibitors, and lipitor [atorvastatin].   Medication Reconciliation: Current Outpatient Medications on File Prior to Visit  Medication Sig   atenolol  (TENORMIN ) 50 MG tablet Take 50 mg by mouth daily.   Blood Glucose Monitoring Suppl DEVI Use kit to check blood sugar once a day   Cholecalciferol  125 MCG (5000 UT) TABS Take 1 tablet (5,000 Units total) by mouth at bedtime.   clopidogrel  (PLAVIX ) 75 MG tablet Take 1 tablet (75 mg total) by mouth daily.   Cyanocobalamin  (B-12) 5000 MCG SUBL Place 1 tablet under the tongue daily.   ezetimibe  (ZETIA ) 10 MG tablet Take 1 tablet (10 mg total) by mouth daily.   glucose blood (CONTOUR NEXT TEST) test strip USE TO TEST BLOOD SUGAR ONCE DAILY   Lancets MISC Use to test blood sugar once daily   levothyroxine  (SYNTHROID ) 50 MCG tablet TAKE 1 TABLET BY MOUTH DAILY ON AN EMPTY STOMACH WITH ONLY WATER FOR 30 MINUTES & NO ANTACID MEDS, CALCIUM  OR MAGNESIUM FOR 4 HOURS & AVOID BIOTIN   metFORMIN  (GLUCOPHAGE -XR) 500 MG 24 hr tablet TAKE 2 TABLETS BY MOUTH 2 TIMES PER DAY WITH MEALS FOR DIABETES   Multiple Vitamin (MULTIVITAMIN) tablet Take 1 tablet by mouth at bedtime.    Omega-3 Fatty Acids (FISH OIL ) 1000 MG CAPS Take 1 capsule (1,000 mg total) by mouth at bedtime.   rosuvastatin  (CRESTOR ) 10 MG tablet Take 1 tablet (10 mg total) by mouth See admin instructions. Take one tablet by mouth twice a week Sundays and Wednesdays   No current facility-administered medications on file prior to visit.   Medications Discontinued During This Encounter  Medication Reason   famotidine  (PEPCID ) 20 MG  tablet    empagliflozin  (JARDIANCE ) 10 MG TABS tablet    gabapentin  (NEURONTIN ) 300 MG capsule    Rimegepant Sulfate (NURTEC) 75 MG TBDP      Physical Exam:    02/14/2024   10:09 AM 01/11/2024    9:40 AM 12/21/2023    9:48 AM  Vitals with BMI  Height 5' 10 5' 10 5' 10  Weight 173 lbs 10 oz 172 lbs 173 lbs 10 oz  BMI 24.91 24.68 24.91  Systolic 126 110 877   Diastolic 60 62 62  Pulse 59 72 74  Vital signs reviewed.  Nursing notes reviewed. Weight trend reviewed. Physical Exam General Appearance:  No acute distress appreciable.   Well-groomed, healthy-appearing male.  Well proportioned with no abnormal fat distribution.  Good muscle tone. Pulmonary:  Normal work of breathing at rest, no respiratory distress apparent. SpO2: 98 %  Musculoskeletal: All extremities are intact.  Neurological:  Awake, alert, oriented, and engaged.  No obvious focal neurological deficits or cognitive impairments - except with gait.  Sensorium seems unclouded.   Speech is clear and coherent with logical content. Psychiatric:  Appropriate mood, pleasant and cooperative demeanor, thoughtful and engaged during the exam Physical Exam MEASUREMENTS: Weight- 172. EXTREMITIES: Good color, no cyanosis or edema. NEUROLOGICAL: Cerebellar function intact with normal heel to shin test. Alternating finger to thumb test slightly slow. Rapid alternating movements intact. Finger to nose test intact. Moves all extremities without gross motor or sensory deficit.  Can take large steps but not heel-to-toe.  Balance good without moving. When he leans over in chair he nearly falls out.  Results:    05/08/2023    9:53 PM 05/08/2023    9:47 PM 01/18/2023   11:09 AM 10/06/2022    9:51 PM  PHQ 2/9 Scores  PHQ - 2 Score 0 0 0 0   Results RADIOLOGY Head MRI: Chronic cerebral ischemia (07/2022) CT Angiogram: Blockage in right V4 segment of vertebral artery (07/2022)  DIAGNOSTIC Swallow Study: Aspiration of fluid past vocal cords without pulmonary entry    No results found for any visits on 02/14/24. Office Visit on 09/20/2023  Component Date Value Ref Range Status   Sodium 09/20/2023 140  135 - 145 mEq/L Final   Potassium 09/20/2023 4.5  3.5 - 5.1 mEq/L Final   Chloride 09/20/2023 101  96 - 112 mEq/L Final   CO2 09/20/2023 31  19 - 32 mEq/L Final   Glucose, Bld 09/20/2023 92  70 - 99  mg/dL Final   BUN 96/80/7974 13  6 - 23 mg/dL Final   Creatinine, Ser 09/20/2023 1.03  0.40 - 1.50 mg/dL Final   Total Bilirubin 09/20/2023 0.4  0.2 - 1.2 mg/dL Final   Alkaline Phosphatase 09/20/2023 46  39 - 117 U/L Final   AST 09/20/2023 19  0 - 37 U/L Final   ALT 09/20/2023 14  0 - 53 U/L Final   Total Protein 09/20/2023 7.2  6.0 - 8.3 g/dL Final   Albumin 96/80/7974 4.5  3.5 - 5.2 g/dL Final   GFR 96/80/7974 68.91  >60.00 mL/min Final   Calcium  09/20/2023 9.7  8.4 - 10.5 mg/dL Final   TSH W/REFLEX TO FT4 09/20/2023 1.62  0.40 - 4.50 mIU/L Final   Microalb, Ur 09/20/2023 0.7  0.0 - 1.9 mg/dL Final   Creatinine,U 96/80/7974 55.8  mg/dL Final   Microalb Creat Ratio 09/20/2023 Unable to calculate  0.0 - 30.0 mg/g Final   QUESTION/PROBLEM 09/20/2023    Final  UNCLEAR ORDER: 09/20/2023 833 NO LONGER AVAILABLE   Final   SPECIMEN(S) SUBMITTED 09/20/2023 LAV   Final  Admission on 08/03/2023, Discharged on 08/04/2023  Component Date Value Ref Range Status   Glucose-Capillary 08/03/2023 105 (H)  70 - 99 mg/dL Final   Sodium 98/69/7974 140  135 - 145 mmol/L Final   Potassium 08/03/2023 4.2  3.5 - 5.1 mmol/L Final   Chloride 08/03/2023 103  98 - 111 mmol/L Final   BUN 08/03/2023 18  8 - 23 mg/dL Final   Creatinine, Ser 08/03/2023 1.20  0.61 - 1.24 mg/dL Final   Glucose, Bld 98/69/7974 105 (H)  70 - 99 mg/dL Final   Calcium , Ion 08/03/2023 1.17  1.15 - 1.40 mmol/L Final   TCO2 08/03/2023 27  22 - 32 mmol/L Final   Hemoglobin 08/03/2023 12.9 (L)  13.0 - 17.0 g/dL Final   HCT 98/69/7974 38.0 (L)  39.0 - 52.0 % Final   Glucose-Capillary 08/03/2023 141 (H)  70 - 99 mg/dL Final   Glucose-Capillary 08/03/2023 240 (H)  70 - 99 mg/dL Final   Comment 1 98/69/7974 Notify RN   Final   Comment 2 08/03/2023 Document in Chart   Final   Glucose-Capillary 08/04/2023 115 (H)  70 - 99 mg/dL Final   Comment 1 98/68/7974 Notify RN   Final   Comment 2 08/04/2023 Document in Chart   Final  Hospital  Outpatient Visit on 07/27/2023  Component Date Value Ref Range Status   Glucose-Capillary 07/27/2023 135 (H)  70 - 99 mg/dL Final   MRSA, PCR 98/76/7974 NEGATIVE  NEGATIVE Final   Staphylococcus aureus 07/27/2023 NEGATIVE  NEGATIVE Final   Hgb A1c MFr Bld 07/27/2023 6.2 (H)  4.8 - 5.6 % Final   Mean Plasma Glucose 07/27/2023 131.24  mg/dL Final   Sodium 98/76/7974 140  135 - 145 mmol/L Final   Potassium 07/27/2023 5.8 (H)  3.5 - 5.1 mmol/L Final   Chloride 07/27/2023 102  98 - 111 mmol/L Final   CO2 07/27/2023 28  22 - 32 mmol/L Final   Glucose, Bld 07/27/2023 137 (H)  70 - 99 mg/dL Final   BUN 98/76/7974 14  8 - 23 mg/dL Final   Creatinine, Ser 07/27/2023 1.19  0.61 - 1.24 mg/dL Final   Calcium  07/27/2023 10.0  8.9 - 10.3 mg/dL Final   Total Protein 98/76/7974 7.3  6.5 - 8.1 g/dL Final   Albumin 98/76/7974 4.2  3.5 - 5.0 g/dL Final   AST 98/76/7974 21  15 - 41 U/L Final   ALT 07/27/2023 17  0 - 44 U/L Final   Alkaline Phosphatase 07/27/2023 56  38 - 126 U/L Final   Total Bilirubin 07/27/2023 0.9  0.0 - 1.2 mg/dL Final   GFR, Estimated 07/27/2023 >60  >60 mL/min Final   Anion gap 07/27/2023 10  5 - 15 Final   WBC 07/27/2023 6.0  4.0 - 10.5 K/uL Final   RBC 07/27/2023 3.97 (L)  4.22 - 5.81 MIL/uL Final   Hemoglobin 07/27/2023 13.5  13.0 - 17.0 g/dL Final   HCT 98/76/7974 41.5  39.0 - 52.0 % Final   MCV 07/27/2023 104.5 (H)  80.0 - 100.0 fL Final   MCH 07/27/2023 34.0  26.0 - 34.0 pg Final   MCHC 07/27/2023 32.5  30.0 - 36.0 g/dL Final   RDW 98/76/7974 12.4  11.5 - 15.5 % Final   Platelets 07/27/2023 186  150 - 400 K/uL Final   nRBC 07/27/2023 0.0  0.0 - 0.2 %  Final  Orders Only on 07/12/2023  Component Date Value Ref Range Status   Fecal Occult Blood, POC 07/12/2023 Negative  Negative Final   Card #2 Fecal Occult Blod, POC 07/12/2023 Negative   Final   Card #3 Fecal Occult Blood, POC 07/12/2023 Negative   Final  Abstract on 05/24/2023  Component Date Value Ref Range Status   HM  Diabetic Eye Exam 05/15/2023 No Retinopathy  No Retinopathy Final  Office Visit on 05/08/2023  Component Date Value Ref Range Status   Color, Urine 05/08/2023 YELLOW  YELLOW Final   APPearance 05/08/2023 CLEAR  CLEAR Final   Specific Gravity, Urine 05/08/2023 1.015  1.001 - 1.035 Final   pH 05/08/2023 6.0  5.0 - 8.0 Final   Glucose, UA 05/08/2023 NEGATIVE  NEGATIVE Final   Bilirubin Urine 05/08/2023 NEGATIVE  NEGATIVE Final   Ketones, ur 05/08/2023 NEGATIVE  NEGATIVE Final   Hgb urine dipstick 05/08/2023 NEGATIVE  NEGATIVE Final   Protein, ur 05/08/2023 NEGATIVE  NEGATIVE Final   Nitrite 05/08/2023 NEGATIVE  NEGATIVE Final   Leukocytes,Ua 05/08/2023 NEGATIVE  NEGATIVE Final   Creatinine, Urine 05/08/2023 95  20 - 320 mg/dL Final   Microalb, Ur 88/95/7975 0.7  mg/dL Final   Microalb Creat Ratio 05/08/2023 7  <30 mg/g creat Final   Vitamin B-12 05/08/2023 1,273 (H)  200 - 1,100 pg/mL Final   PSA 05/08/2023 0.79  < OR = 4.00 ng/mL Final   WBC 05/08/2023 5.5  3.8 - 10.8 Thousand/uL Final   RBC 05/08/2023 3.63 (L)  4.20 - 5.80 Million/uL Final   Hemoglobin 05/08/2023 12.2 (L)  13.2 - 17.1 g/dL Final   HCT 88/95/7975 37.5 (L)  38.5 - 50.0 % Final   MCV 05/08/2023 103.3 (H)  80.0 - 100.0 fL Final   MCH 05/08/2023 33.6 (H)  27.0 - 33.0 pg Final   MCHC 05/08/2023 32.5  32.0 - 36.0 g/dL Final   RDW 88/95/7975 12.0  11.0 - 15.0 % Final   Platelets 05/08/2023 176  140 - 400 Thousand/uL Final   MPV 05/08/2023 9.4  7.5 - 12.5 fL Final   Neutro Abs 05/08/2023 2,855  1,500 - 7,800 cells/uL Final   Absolute Lymphocytes 05/08/2023 1,925  850 - 3,900 cells/uL Final   Absolute Monocytes 05/08/2023 506  200 - 950 cells/uL Final   Eosinophils Absolute 05/08/2023 193  15 - 500 cells/uL Final   Basophils Absolute 05/08/2023 22  0 - 200 cells/uL Final   Neutrophils Relative % 05/08/2023 51.9  % Final   Total Lymphocyte 05/08/2023 35.0  % Final   Monocytes Relative 05/08/2023 9.2  % Final   Eosinophils  Relative 05/08/2023 3.5  % Final   Basophils Relative 05/08/2023 0.4  % Final   Glucose, Bld 05/08/2023 138  65 - 139 mg/dL Final   BUN 88/95/7975 17  7 - 25 mg/dL Final   Creat 88/95/7975 0.97  0.70 - 1.28 mg/dL Final   eGFR 88/95/7975 79  > OR = 60 mL/min/1.78m2 Final   BUN/Creatinine Ratio 05/08/2023 SEE NOTE:  6 - 22 (calc) Final   Sodium 05/08/2023 140  135 - 146 mmol/L Final   Potassium 05/08/2023 4.7  3.5 - 5.3 mmol/L Final   Chloride 05/08/2023 102  98 - 110 mmol/L Final   CO2 05/08/2023 31  20 - 32 mmol/L Final   Calcium  05/08/2023 9.7  8.6 - 10.3 mg/dL Final   Total Protein 88/95/7975 7.0  6.1 - 8.1 g/dL Final   Albumin 88/95/7975 4.3  3.6 - 5.1 g/dL Final   Globulin 88/95/7975 2.7  1.9 - 3.7 g/dL (calc) Final   AG Ratio 05/08/2023 1.6  1.0 - 2.5 (calc) Final   Total Bilirubin 05/08/2023 0.6  0.2 - 1.2 mg/dL Final   Alkaline phosphatase (APISO) 05/08/2023 62  35 - 144 U/L Final   AST 05/08/2023 15  10 - 35 U/L Final   ALT 05/08/2023 11  9 - 46 U/L Final   Magnesium 05/08/2023 1.9  1.5 - 2.5 mg/dL Final   Cholesterol 88/95/7975 125  <200 mg/dL Final   HDL 88/95/7975 48  > OR = 40 mg/dL Final   Triglycerides 88/95/7975 163 (H)  <150 mg/dL Final   LDL Cholesterol (Calc) 05/08/2023 53  mg/dL (calc) Final   Total CHOL/HDL Ratio 05/08/2023 2.6  <4.9 (calc) Final   Non-HDL Cholesterol (Calc) 05/08/2023 77  <130 mg/dL (calc) Final   TSH 88/95/7975 2.12  0.40 - 4.50 mIU/L Final   Hgb A1c MFr Bld 05/08/2023 6.4 (H)  <5.7 % of total Hgb Final   Mean Plasma Glucose 05/08/2023 137  mg/dL Final   eAG (mmol/L) 88/95/7975 7.6  mmol/L Final   Insulin  05/08/2023 49.3 (H)  uIU/mL Final   Vit D, 25-Hydroxy 05/08/2023 76  30 - 100 ng/mL Final  Office Visit on 04/10/2023  Component Date Value Ref Range Status   Glucose, Bld 04/10/2023 99  65 - 99 mg/dL Final   BUN 89/92/7975 20  7 - 25 mg/dL Final   Creat 89/92/7975 0.94  0.70 - 1.28 mg/dL Final   eGFR 89/92/7975 82  > OR = 60 mL/min/1.10m2  Final   BUN/Creatinine Ratio 04/10/2023 SEE NOTE:  6 - 22 (calc) Final   Sodium 04/10/2023 140  135 - 146 mmol/L Final   Potassium 04/10/2023 4.7  3.5 - 5.3 mmol/L Final   Chloride 04/10/2023 104  98 - 110 mmol/L Final   CO2 04/10/2023 25  20 - 32 mmol/L Final   Calcium  04/10/2023 9.7  8.6 - 10.3 mg/dL Final  Office Visit on 03/24/2023  Component Date Value Ref Range Status   WBC 03/24/2023 5.6  3.8 - 10.8 Thousand/uL Final   RBC 03/24/2023 3.82 (L)  4.20 - 5.80 Million/uL Final   Hemoglobin 03/24/2023 12.5 (L)  13.2 - 17.1 g/dL Final   HCT 90/79/7975 39.8  38.5 - 50.0 % Final   MCV 03/24/2023 104.2 (H)  80.0 - 100.0 fL Final   MCH 03/24/2023 32.7  27.0 - 33.0 pg Final   MCHC 03/24/2023 31.4 (L)  32.0 - 36.0 g/dL Final   RDW 90/79/7975 11.6  11.0 - 15.0 % Final   Platelets 03/24/2023 288  140 - 400 Thousand/uL Final   MPV 03/24/2023 9.4  7.5 - 12.5 fL Final   Neutro Abs 03/24/2023 2,990  1,500 - 7,800 cells/uL Final   Lymphs Abs 03/24/2023 1,865  850 - 3,900 cells/uL Final   Absolute Monocytes 03/24/2023 543  200 - 950 cells/uL Final   Eosinophils Absolute 03/24/2023 162  15 - 500 cells/uL Final   Basophils Absolute 03/24/2023 39  0 - 200 cells/uL Final   Neutrophils Relative % 03/24/2023 53.4  % Final   Total Lymphocyte 03/24/2023 33.3  % Final   Monocytes Relative 03/24/2023 9.7  % Final   Eosinophils Relative 03/24/2023 2.9  % Final   Basophils Relative 03/24/2023 0.7  % Final   Glucose, Bld 03/24/2023 158 (H)  65 - 99 mg/dL Final   BUN 90/79/7975 20  7 -  25 mg/dL Final   Creat 90/79/7975 1.05  0.70 - 1.28 mg/dL Final   eGFR 90/79/7975 72  > OR = 60 mL/min/1.40m2 Final   BUN/Creatinine Ratio 03/24/2023 SEE NOTE:  6 - 22 (calc) Final   Sodium 03/24/2023 139  135 - 146 mmol/L Final   Potassium 03/24/2023 5.7 (H)  3.5 - 5.3 mmol/L Final   Chloride 03/24/2023 101  98 - 110 mmol/L Final   CO2 03/24/2023 28  20 - 32 mmol/L Final   Calcium  03/24/2023 10.0  8.6 - 10.3 mg/dL Final    Total Protein 03/24/2023 7.0  6.1 - 8.1 g/dL Final   Albumin 90/79/7975 4.2  3.6 - 5.1 g/dL Final   Globulin 90/79/7975 2.8  1.9 - 3.7 g/dL (calc) Final   AG Ratio 03/24/2023 1.5  1.0 - 2.5 (calc) Final   Total Bilirubin 03/24/2023 0.5  0.2 - 1.2 mg/dL Final   Alkaline phosphatase (APISO) 03/24/2023 78  35 - 144 U/L Final   AST 03/24/2023 13  10 - 35 U/L Final   ALT 03/24/2023 12  9 - 46 U/L Final  Admission on 03/16/2023, Discharged on 03/16/2023  Component Date Value Ref Range Status   Lactic Acid, Venous 03/16/2023 2.6 (HH)  0.5 - 1.9 mmol/L Final   Comment 03/16/2023 NOTIFIED PHYSICIAN   Final   Sodium 03/16/2023 137  135 - 145 mmol/L Final   Potassium 03/16/2023 3.8  3.5 - 5.1 mmol/L Final   Chloride 03/16/2023 102  98 - 111 mmol/L Final   CO2 03/16/2023 24  22 - 32 mmol/L Final   Glucose, Bld 03/16/2023 203 (H)  70 - 99 mg/dL Final   BUN 90/87/7975 15  8 - 23 mg/dL Final   Creatinine, Ser 03/16/2023 1.13  0.61 - 1.24 mg/dL Final   Calcium  03/16/2023 8.7 (L)  8.9 - 10.3 mg/dL Final   Total Protein 90/87/7975 6.2 (L)  6.5 - 8.1 g/dL Final   Albumin 90/87/7975 3.5  3.5 - 5.0 g/dL Final   AST 90/87/7975 27  15 - 41 U/L Final   ALT 03/16/2023 20  0 - 44 U/L Final   Alkaline Phosphatase 03/16/2023 44  38 - 126 U/L Final   Total Bilirubin 03/16/2023 0.8  0.3 - 1.2 mg/dL Final   GFR, Estimated 03/16/2023 >60  >60 mL/min Final   Anion gap 03/16/2023 11  5 - 15 Final   WBC 03/16/2023 9.3  4.0 - 10.5 K/uL Final   RBC 03/16/2023 3.34 (L)  4.22 - 5.81 MIL/uL Final   Hemoglobin 03/16/2023 11.5 (L)  13.0 - 17.0 g/dL Final   HCT 90/87/7975 34.8 (L)  39.0 - 52.0 % Final   MCV 03/16/2023 104.2 (H)  80.0 - 100.0 fL Final   MCH 03/16/2023 34.4 (H)  26.0 - 34.0 pg Final   MCHC 03/16/2023 33.0  30.0 - 36.0 g/dL Final   RDW 90/87/7975 12.6  11.5 - 15.5 % Final   Platelets 03/16/2023 146 (L)  150 - 400 K/uL Final   nRBC 03/16/2023 0.0  0.0 - 0.2 % Final   Neutrophils Relative % 03/16/2023 81  %  Final   Neutro Abs 03/16/2023 7.6  1.7 - 7.7 K/uL Final   Lymphocytes Relative 03/16/2023 11  % Final   Lymphs Abs 03/16/2023 1.0  0.7 - 4.0 K/uL Final   Monocytes Relative 03/16/2023 7  % Final   Monocytes Absolute 03/16/2023 0.6  0.1 - 1.0 K/uL Final   Eosinophils Relative 03/16/2023 0  % Final  Eosinophils Absolute 03/16/2023 0.0  0.0 - 0.5 K/uL Final   Basophils Relative 03/16/2023 0  % Final   Basophils Absolute 03/16/2023 0.0  0.0 - 0.1 K/uL Final   Immature Granulocytes 03/16/2023 1  % Final   Abs Immature Granulocytes 03/16/2023 0.06  0.00 - 0.07 K/uL Final   Prothrombin Time 03/16/2023 15.2  11.4 - 15.2 seconds Final   INR 03/16/2023 1.2  0.8 - 1.2 Final   aPTT 03/16/2023 25  24 - 36 seconds Final   Specimen Description 03/16/2023 BLOOD SITE NOT SPECIFIED   Final   Special Requests 03/16/2023 BOTTLES DRAWN AEROBIC AND ANAEROBIC Blood Culture adequate volume   Final   Culture 03/16/2023    Final                   Value:NO GROWTH 5 DAYS Performed at [facility], [address] [place], [place] [address]    Report Status 03/16/2023 03/21/2023 FINAL   Final   Specimen Description 03/16/2023 BLOOD RIGHT ARM   Final   Special Requests 03/16/2023 BOTTLES DRAWN AEROBIC AND ANAEROBIC Blood Culture adequate volume   Final   Culture 03/16/2023    Final                   Value:NO GROWTH 5 DAYS Performed at [facility], [address] [place], [place] [address]    Report Status 03/16/2023 03/21/2023 FINAL   Final   Specimen Source 03/16/2023 URINE, CLEAN CATCH   Final   Color, Urine 03/16/2023 YELLOW  YELLOW Final   APPearance 03/16/2023 CLEAR  CLEAR Final   Specific Gravity, Urine 03/16/2023 1.016  1.005 - 1.030 Final   pH 03/16/2023 5.0  5.0 - 8.0 Final   Glucose, UA 03/16/2023 150 (A)  NEGATIVE mg/dL Final   Hgb urine dipstick 03/16/2023 NEGATIVE  NEGATIVE Final   Bilirubin Urine 03/16/2023 NEGATIVE  NEGATIVE Final   Ketones, ur 03/16/2023 NEGATIVE  NEGATIVE mg/dL  Final   Protein, ur 90/87/7975 NEGATIVE  NEGATIVE mg/dL Final   Nitrite 90/87/7975 NEGATIVE  NEGATIVE Final   Leukocytes,Ua 03/16/2023 NEGATIVE  NEGATIVE Final   RBC / HPF 03/16/2023 6-10  0 - 5 RBC/hpf Final   WBC, UA 03/16/2023 0-5  0 - 5 WBC/hpf Final   Bacteria, UA 03/16/2023 RARE (A)  NONE SEEN Final   Squamous Epithelial / HPF 03/16/2023 0-5  0 - 5 /HPF Final   Mucus 03/16/2023 PRESENT   Final   SARS Coronavirus 2 by RT PCR 03/16/2023 NEGATIVE  NEGATIVE Final   Lactic Acid, Venous 03/16/2023 1.7  0.5 - 1.9 mmol/L Final  Office Visit on 01/18/2023  Component Date Value Ref Range Status   WBC 01/18/2023 6.1  3.8 - 10.8 Thousand/uL Final   RBC 01/18/2023 4.05 (L)  4.20 - 5.80 Million/uL Final   Hemoglobin 01/18/2023 13.7  13.2 - 17.1 g/dL Final   HCT 92/82/7975 41.9  38.5 - 50.0 % Final   MCV 01/18/2023 103.5 (H)  80.0 - 100.0 fL Final   MCH 01/18/2023 33.8 (H)  27.0 - 33.0 pg Final   MCHC 01/18/2023 32.7  32.0 - 36.0 g/dL Final   RDW 92/82/7975 11.8  11.0 - 15.0 % Final   Platelets 01/18/2023 187  140 - 400 Thousand/uL Final   MPV 01/18/2023 9.6  7.5 - 12.5 fL Final   Neutro Abs 01/18/2023 3,373  1,500 - 7,800 cells/uL Final   Lymphs Abs 01/18/2023 1,964  850 - 3,900 cells/uL  Final   Absolute Monocytes 01/18/2023 604  200 - 950 cells/uL Final   Eosinophils Absolute 01/18/2023 128  15 - 500 cells/uL Final   Basophils Absolute 01/18/2023 31  0 - 200 cells/uL Final   Neutrophils Relative % 01/18/2023 55.3  % Final   Total Lymphocyte 01/18/2023 32.2  % Final   Monocytes Relative 01/18/2023 9.9  % Final   Eosinophils Relative 01/18/2023 2.1  % Final   Basophils Relative 01/18/2023 0.5  % Final   Glucose, Bld 01/18/2023 213 (H)  65 - 99 mg/dL Final   BUN 92/82/7975 17  7 - 25 mg/dL Final   Creat 92/82/7975 1.16  0.70 - 1.28 mg/dL Final   eGFR 92/82/7975 64  > OR = 60 mL/min/1.59m2 Final   BUN/Creatinine Ratio 01/18/2023 SEE NOTE:  6 - 22 (calc) Final   Sodium 01/18/2023 140  135 -  146 mmol/L Final   Potassium 01/18/2023 5.4 (H)  3.5 - 5.3 mmol/L Final   Chloride 01/18/2023 101  98 - 110 mmol/L Final   CO2 01/18/2023 27  20 - 32 mmol/L Final   Calcium  01/18/2023 10.4 (H)  8.6 - 10.3 mg/dL Final   Total Protein 92/82/7975 7.8  6.1 - 8.1 g/dL Final   Albumin 92/82/7975 4.9  3.6 - 5.1 g/dL Final   Globulin 92/82/7975 2.9  1.9 - 3.7 g/dL (calc) Final   AG Ratio 01/18/2023 1.7  1.0 - 2.5 (calc) Final   Total Bilirubin 01/18/2023 0.5  0.2 - 1.2 mg/dL Final   Alkaline phosphatase (APISO) 01/18/2023 62  35 - 144 U/L Final   AST 01/18/2023 18  10 - 35 U/L Final   ALT 01/18/2023 19  9 - 46 U/L Final   Cholesterol 01/18/2023 151  <200 mg/dL Final   HDL 92/82/7975 55  > OR = 40 mg/dL Final   Triglycerides 92/82/7975 162 (H)  <150 mg/dL Final   LDL Cholesterol (Calc) 01/18/2023 72  mg/dL (calc) Final   Total CHOL/HDL Ratio 01/18/2023 2.7  <4.9 (calc) Final   Non-HDL Cholesterol (Calc) 01/18/2023 96  <130 mg/dL (calc) Final   TSH 92/82/7975 2.08  0.40 - 4.50 mIU/L Final   Hgb A1c MFr Bld 01/18/2023 6.4 (H)  <5.7 % of total Hgb Final   Iron 01/18/2023 149  50 - 180 mcg/dL Final   TIBC 92/82/7975 349  250 - 425 mcg/dL (calc) Final   %SAT 92/82/7975 43  20 - 48 % (calc) Final   Ferritin 01/18/2023 46  24 - 380 ng/mL Final   Vitamin B-12 01/18/2023 >2,000 (H)  200 - 1,100 pg/mL Final  There may be more visits with results that are not included.  No image results found. [facility] SPEECH [facility] Result Date: 11/29/2023 CLINICAL DATA:  Dysphagia. Intermittent cough after swallowing. Request for modified barium swallow with speech pathology. EXAM: MODIFIED BARIUM SWALLOW TECHNIQUE: Different consistencies of barium were administered orally to the patient by the Speech Pathologist. Imaging of the pharynx was performed in the lateral projection. This exam was performed by Franky Rusk PA-C , and was supervised and interpreted by Dr. Dasie Hamburg. FLUOROSCOPY: Radiation  Exposure Index (as provided by the fluoroscopic device): 33.06 mGy Kerm[place] COMPARISON:  None Available. FINDINGS: Vestibular Penetration: Consistent penetration to the level of the cords seen with thin barium. Brief vestibular penetration seen with nectar thick consistency. Aspiration:  None seen. Other:  None. IMPRESSION: Penetration without aspiration. Please refer to the Speech Pathologist's report for complete details and recommendations.  Electronically Signed   By: Dasie Hamburg M.D.   On: 11/29/2023 13:16         ASSESSMENT & PLAN   Assessment & Plan Dizziness At maximum risk for fall Numbness and tingling in left arm B12 deficiency Ataxia Chronic dizziness and gait disturbance with chronic cerebral ischemia and cognitive impairment    He experiences chronic dizziness and gait disturbance, characterized by vertigo and lightheadedness without a clear trigger. Differential diagnosis includes cervical spine issues and vascular insufficiency to the cerebellum. Neurological involvement is suspected, but Parkinson's disease has been ruled out. Previous MRIs show chronic brain ischemia. Severe dizziness and difficulty walking require support. Dizziness is not always associated with neck movement, suggesting a possible vascular cause. He is currently on Zetia  and rosuvastatin  to manage cholesterol and improve blood flow. Initiation of Ozempic  is planned to further improve cerebral blood flow and manage diabetes. Refer to a new neurologist for further evaluation. Reorder MRI of the brain to assess changes in cerebral ischemia. Continue Zetia  and rosuvastatin . Initiate Ozempic  and monitor for side effects, including diarrhea. Educate on the importance of continuing to seek medical opinions due to the severity of the problem.  He can take a large step and has no tremor, but cannot walk heel to toe.  Dizziness seems Multifactorial- but major factors are peripheral arterial disease with vertebral  circulation impairment, chronic sm vessel ischemic disease. Severe spinal stenosis not amenable to further surgery per surgeon, possible ear involvement (occasional ear pain right).  Main question never answered for which we seek neurology input is what is causing it and what can help reduce future fall risk.  Also, we would like neurology opinion on:  He experiences chronic numbness of the left arm following skin cancer surgery, likely due to nerve damage from the procedure. No new interventions are planned unless further evaluation by a neurologist suggests otherwise. Discuss with the neurologist during referral for dizziness and numbness.  He has a deficiency of B group vitamins, currently under evaluation. He is taking B12 supplements. Previous blood work showed deficiency, but current absorption status is unknown. Check B12 levels as part of routine blood work and continue B12 supplementation as prescribed. Peripheral arterial disease (HCC) Chronic cerebral ischemia Hyperlipidemia associated with type 2 diabetes mellitus (HCC) History of TIA (transient ischemic attack) He has peripheral vascular disease with lower extremity arterial insufficiency, experiencing intermittent pain and numbness in the legs, particularly the left leg. Prolonged capillary refill time indicates poor blood flow, but no immediate need for vascular intervention. His feet are healthy for his age. Current medications aim to manage blood flow and prevent further complications. Continue current medications (Zetia , Plavix , rosuvastatin ) and monitor for any worsening symptoms or changes in leg pain or numbness. DISH (diffuse idiopathic skeletal hyperostosis) He has diffuse idiopathic skeletal hyperostosis (DISH) of the cervical spine, likely contributing to neck stiffness and possibly related to dizziness. No acute neck pain is reported. This condition may be related to past occupational activities involving neck strain. Discuss  with the neurologist during referral for dizziness. Dysphagia causing pulmonary aspiration with swallowing His chronic dysphagia is currently stable. A previous swallow study showed fluid passing the vocal cords but not entering the lungs. There are no current coughing spells with meals or signs of aspiration pneumonia. Exercises have been prescribed to strengthen swallowing muscles. Continue exercises and monitor for any changes in swallowing or coughing during meals. CKD stage 2 due to type 2 diabetes mellitus (HCC) Type 2 diabetes mellitus with  stage 2 chronic kidney disease, unspecified whether long term insulin  use (HCC) His type 2 diabetes mellitus is well controlled with metformin , with an A1c of 6.2% six months ago. There are no current issues with blood sugar control. Consider Ozempic  for its potential benefits in improving blood flow and preventing strokes, in addition to its glucose-lowering effects. Continue metformin  as prescribed. Check A1c as part of routine blood work. Hypothyroidism, unspecified type Will order lab testing to guide management.     ORDER ASSOCIATIONS  #   DIAGNOSIS / CONDITION ICD-10 ENCOUNTER ORDER     ICD-10-CM   1. Dizziness  R42 MR Brain W Wo Contrast    Ambulatory referral to Neurology    CANCELED: Ambulatory referral to Neurology    2. At maximum risk for fall  Z91.81     3. Numbness and tingling in left arm  R20.0 MR Brain W Wo Contrast   R20.2 Ambulatory referral to Neurology    CANCELED: Ambulatory referral to Neurology    4. B12 deficiency  E53.8 B12 and Folate Panel    Methylmalonic Acid    Ambulatory referral to Neurology    CANCELED: Ambulatory referral to Neurology    5. Ataxia  R27.0 MR Brain W Wo Contrast    Ambulatory referral to Neurology    CANCELED: Ambulatory referral to Neurology    6. Peripheral arterial disease (HCC)  I73.9 CBC with Differential/Platelet    Comprehensive metabolic panel with GFR    Lipid panel    MR Brain W  Wo Contrast    7. Chronic cerebral ischemia  I67.82 Semaglutide ,0.25 or 0.5MG /DOS, (OZEMPIC , 0.25 OR 0.5 MG/DOSE,) 2 MG/3ML SOPN    MR Brain W Wo Contrast    Ambulatory referral to Neurology    CANCELED: Ambulatory referral to Neurology    8. Hyperlipidemia associated with type 2 diabetes mellitus (HCC)  E11.69    E78.5     9. History of TIA (transient ischemic attack)  Z86.73 Semaglutide ,0.25 or 0.5MG /DOS, (OZEMPIC , 0.25 OR 0.5 MG/DOSE,) 2 MG/3ML SOPN    MR Brain W Wo Contrast    10. DISH (diffuse idiopathic skeletal hyperostosis)  M48.10     11. Dysphagia causing pulmonary aspiration with swallowing  R13.19 MR Brain W Wo Contrast    12. CKD stage 2 due to type 2 diabetes mellitus (HCC)  E11.22    N18.2     13. Type 2 diabetes mellitus with stage 2 chronic kidney disease, unspecified whether long term insulin  use (HCC)  E11.22 CBC with Differential/Platelet   N18.2 Comprehensive metabolic panel with GFR    Lipid panel    Hemoglobin A1c    Microalbumin / creatinine urine ratio    Semaglutide ,0.25 or 0.5MG /DOS, (OZEMPIC , 0.25 OR 0.5 MG/DOSE,) 2 MG/3ML SOPN    MR Brain W Wo Contrast    14. Hypothyroidism, unspecified type  E03.9 TSH + free T4           Orders Placed in Encounter:   Lab Orders         CBC with Differential/Platelet         Comprehensive metabolic panel with GFR         Lipid panel         Hemoglobin A1c         Microalbumin / creatinine urine ratio         B12 and Folate Panel         Methylmalonic Acid  TSH + free T4     Imaging Orders         MR Brain W Wo Contrast     Referral Orders         Ambulatory referral to Neurology     Meds ordered this encounter  Medications   Semaglutide ,0.25 or 0.5MG /DOS, (OZEMPIC , 0.25 OR 0.5 MG/DOSE,) 2 MG/3ML SOPN    Sig: Inject 0.25 mg into the skin once a week.    Dispense:  3 mL    Refill:  11    Orders Placed This Encounter  Procedures   MR Brain W Wo Contrast    Standing Status:   Future     Expiration Date:   02/13/2025    If indicated for the ordered procedure, I authorize the administration of contrast media per Radiology protocol:   Yes    What is the patient's sedation requirement?:   No Sedation    Does the patient have a pacemaker or implanted devices?:   No    Preferred imaging location?:   [facility] (table limit-550lbs)   CBC with Differential/Platelet   Comprehensive metabolic panel with GFR    [place]    Has the patient fasted?:   No   Lipid panel    [place]    Has the patient fasted?:   No   Hemoglobin A1c    [place]   Microalbumin / creatinine urine ratio   B12 and Folate Panel   Methylmalonic Acid   TSH + free T4   Ambulatory referral to Neurology    Referral Priority:   Urgent    Referral Type:   Consultation    Referral Reason:   Specialty Services Required    Requested Specialty:   Neurology    Number of Visits Requested:   1   ED Discharge Orders          Ordered    Ambulatory referral to Neurology  Status:  Canceled       Comments: An appointment is requested in approximately: 4 weeks Dizziness, vertigo, very bad neck, numbness left arm, chronic cerebral ischemia worsening on 07/2022 mri   02/14/24 1110    CBC with Differential/Platelet        02/14/24 1110    Comprehensive metabolic panel with GFR       Comments: [place]    02/14/24 1110    Lipid panel       Comments: [place]    02/14/24 1110    Hemoglobin A1c       Comments: [place]    02/14/24 1110    Microalbumin / creatinine urine ratio        02/14/24 1110    B12 and Folate Panel        02/14/24 1110    Methylmalonic Acid        02/14/24 1110    Semaglutide ,0.25 or 0.5MG /DOS, (OZEMPIC , 0.25 OR 0.5 MG/DOSE,) 2 MG/3ML SOPN  Weekly        02/14/24 1113    MR Brain W Wo Contrast        02/14/24 1115    TSH + free T4        02/14/24 1122    Ambulatory referral to Neurology       Comments: An appointment is requested in approximately: 4 weeks (repeat MRI planned prior  to appointment) Dizziness, vertigo, very bad neck, numbness left arm, chronic cerebral ischemia worsening on 07/2022 MRI  Referral Reason:  78 year old male with chronic  progressive dizziness, gait disturbance, and high fall risk.  Key findings:  Extensive chronic small vessel ischemia on MRI/CT (last CT 03/2023). 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 History of cervical spine surgery and DISH. Symptoms:  Persistent dizziness (described as vertigo/lightheadedness), unsteady gait, difficulty walking, near falls when leaning forward. Numbness in left arm (post skin cancer surgery), stable dysphagia. Questions:  What is the primary cause of dizziness and gait disturbance? Is cervicomedullary impingement contributing to symptoms? Recommendations for management and fall prevention? Is further intervention (surgical/vascular) indicated? Recent workup:  MRI/CT brain showing chronic small vessel ischemia, cervicomedullary impingement. BPPV ruled out by ENT. Labs and repeat imaging pending. Patient is at maximum risk for fall and symptoms are significantly impacting quality of life.  Expedited neurology evaluation is requested.   02/14/24 1245              This document was synthesized by artificial intelligence ([employer]) using HIPAA-compliant recording of the clinical interaction;   We discussed the use of AI scribe software for clinical note transcription with the patient, who gave verbal consent to proceed. additional Info: This encounter employed state-of-the-art, real-time, collaborative documentation. The patient actively reviewed and assisted in updating their electronic medical record on a shared screen, ensuring transparency and facilitating joint problem-solving for the problem list, overview, and plan. This approach promotes  accurate, informed care. The treatment plan was discussed and reviewed in detail, including medication safety, potential side effects, and all patient questions. We confirmed understanding and comfort with the plan. Follow-up instructions were established, including contacting the office for any concerns, returning if symptoms worsen, persist, or new symptoms develop, and precautions for potential emergency department visits.       Attestation  I personally spent 49 minutes on 02/14/2024 performing medically necessary patient care for [name]], an 78 year old male presenting with chronic progressive dizziness, gait disturbance, and high fall risk, compounded by advanced cervicomedullary impingement, extensive small vessel ischemic disease, and multiple comorbidities. Extended time was required due to multifactorial diagnostic complexity, failed prior specialty evaluations, and the need for comprehensive risk mitigation.  Activity Breakdown  Pre-Service Chart Review  Reviewed 18 months of prior neurology, ENT, and neurosurgery records (where available). Analyzed serial imaging: 03/2023 CT head (foramen magnum stenosis, cervicomedullary impingement, advanced small vessel ischemia), 07/2022 MRI, and prior CT angiogram (right V4 vertebral artery blockage). Compared historical fall events, medication changes, and progression of neurologic symptoms. Integrated external notes from ENT and neurosurgeon to confirm BPPV exclusion and surgical non-candidacy.  History & Examination : Conducted comprehensive history focusing on dizziness characteristics, triggers (leaning forward), gait instability, and near-fall episodes. Detailed review of symptom evolution, impact on ADLs, and prior failed interventions. Performed full neurologic exam: cerebellar function, gait assessment (unable heel-to-toe), Romberg, cranial nerves, and limb sensation. Assessed for positional changes, orthostatic vitals,  and evaluated for medication side effects.  Medical Decision-Making & Counseling: Synthesized findings to generate a differential diagnosis including: chronic small vessel ischemic disease, cervicomedullary impingement, vertebrobasilar insufficiency, cervical spine degeneration (DISH), and medication effects. Discussed imaging findings and implications with patient/family, emphasizing risks of fall, stroke, and progressive neurologic decline. Provided detailed education on multifactorial etiology, explained necessity for repeat MRI, and discussed rationale for new neurology referral after prior limited evaluation. Addressed patient concerns regarding memory, swallowing, and numbness, including safety planning and home modifications.  Care Coordination & Documentation: Placed expedited ambulatory neurology referral with  comprehensive summary of diagnostic uncertainty, imaging findings, and specific consultation questions. Ordered MR Brain W/WO contrast, B12/Folate panel, Methylmalonic acid, CBC, CMP, lipid panel, A1c, microalbumin/creatinine ratio, TSH/free T4. Initiated semaglutide  (Ozempic ) for vascular risk reduction and diabetes management, with documented evaluation of appropriateness and risk/benefit. Coordinated with nursing and family regarding fall prevention strategies and medication reconciliation. Completed detailed documentation in real time, ensuring all complexity factors and activities were patient-specific. Complexity Narrative  This encounter required extended time due to: Diagnostic uncertainty: Multiple possible etiologies for dizziness (vascular, structural, neurologic, medication-related) with overlapping symptoms and high morbidity risk. Prior failed interventions: ENT and neurosurgery ruled out BPPV and surgical options, necessitating re-evaluation. Imaging complexity: Interpretation and comparison of advanced imaging (CT/MRI) showing foramen magnum stenosis,  cervicomedullary impingement, and evolving small vessel ischemia. High-risk management: Immediate fall risk mitigation and medication adjustments for uncontrolled diabetes and vascular disease. Patient-specific education: Addressed cognitive impairment and teach-back to ensure understanding of complex management plan and safety measures.  Medical Necessity Statement The extended time was medically necessary to: Prevent imminent fall risk and associated morbidity, Clarify multifactorial etiology of dizziness given progressive imaging findings, Ensure safe and effective management of high-risk comorbidities (diabetes, CKD, vascular disease), Coordinate specialty care after prior limited neurology evaluation, Provide comprehensive patient/family education tailored to cognitive and functional limitations. Peer Comparison & Audit Protection  The 49 minutes spent aligns with CMS benchmarks for high-complexity, multi-morbid geriatric neurology encounters in primary care (typical range: 40-55 minutes per CMS specialty data). Each activity and minute is accounted for and justified by patient-specific complexity, diagnostic uncertainty, and clinical risk. Prescription drug management included documented evaluation and initiation of semaglutide , with risk/benefit assessment and monitoring plan. No copy-paste language; documentation is personalized and reflects real-time clinical decision-making.  Documentation exceeds 10 words per minute claimed, supporting activity density. Outcome Justification  Time spent directly contributed to: Identification of high-risk cervicomedullary impingement, Re-Initiation of appropriate specialty referrals and imaging, Medication optimization, Patient/family understanding of risks and plan, Immediate fall risk mitigation.  Final Statement This time investment was essential to deliver safe, effective, and medically necessary care for a patient with evolving, high-risk  neurologic and vascular pathology. Standard visit duration would have been insufficient to address the diagnostic complexity and ensure patient safety.

## 2024-02-14 NOTE — Assessment & Plan Note (Signed)
 He has peripheral vascular disease with lower extremity arterial insufficiency, experiencing intermittent pain and numbness in the legs, particularly the left leg. Prolonged capillary refill time indicates poor blood flow, but no immediate need for vascular intervention. His feet are healthy for his age. Current medications aim to manage blood flow and prevent further complications. Continue current medications (Zetia , Plavix , rosuvastatin ) and monitor for any worsening symptoms or changes in leg pain or numbness.

## 2024-02-14 NOTE — Patient Instructions (Signed)
 It was a pleasure seeing you today! Your health and satisfaction are our top priorities.  Bernardino Cone, MD  VISIT SUMMARY: Today, you were seen for persistent dizziness and difficulty walking, which you have been experiencing for several months. We discussed your history of chronic cerebral ischemia, diabetes, peripheral vascular disease, and other health issues. We reviewed your current medications and made some adjustments to better manage your symptoms and overall health.  YOUR PLAN: -CHRONIC DIZZINESS AND GAIT DISTURBANCE WITH CHRONIC CEREBRAL ISCHEMIA AND COGNITIVE IMPAIRMENT: Chronic dizziness and gait disturbance can be caused by issues with blood flow to the brain or problems with the cervical spine. We will continue your current medications (Zetia  and rosuvastatin ) and start you on Ozempic  to improve blood flow and manage your diabetes. You will be referred to a new neurologist for further evaluation, and an MRI of your brain will be reordered to assess any changes.  -TYPE 2 DIABETES MELLITUS, WELL CONTROLLED: Your type 2 diabetes is well controlled with metformin , and your last A1c was 6.2%. We will continue metformin  and consider the additional benefits of Ozempic  for improving blood flow and preventing strokes. Routine blood work will include checking your A1c.  -PERIPHERAL VASCULAR DISEASE WITH LOWER EXTREMITY ARTERIAL INSUFFICIENCY: Peripheral vascular disease affects blood flow to your legs, causing pain and numbness. We will continue your current medications (Zetia , Plavix , rosuvastatin ) and monitor for any changes in symptoms. No immediate vascular intervention is needed.  -DIFFUSE IDIOPATHIC SKELETAL HYPEROSTOSIS (DISH) OF THE CERVICAL SPINE: DISH is a condition that causes stiffness in the neck and may contribute to your dizziness. This will be discussed further with the neurologist during your referral.  -CHRONIC NUMBNESS OF LEFT ARM POST SKIN CANCER SURGERY: The numbness in your  left arm is likely due to nerve damage from previous skin cancer surgery. No new treatments are planned unless the neurologist suggests otherwise during your referral.  -CHRONIC DYSPHAGIA, STABLE: Chronic dysphagia means you have difficulty swallowing. Your condition is stable, and you should continue the exercises prescribed to strengthen your swallowing muscles. Monitor for any changes in swallowing or coughing during meals.  -DEFICIENCY OF B GROUP VITAMINS, UNDER EVALUATION: You have a deficiency in B group vitamins and are currently taking B12 supplements. We will check your B12 levels as part of your routine blood work and continue supplementation as prescribed.  INSTRUCTIONS: You will be referred to a new neurologist for further evaluation of your dizziness and gait disturbance. An MRI of your brain will be reordered to assess any changes in your chronic cerebral ischemia. Continue taking your current medications (Zetia , rosuvastatin , metformin , Plavix ) and start Ozempic  as prescribed. Monitor for any side effects, including diarrhea. Continue your swallowing exercises and monitor for any changes in swallowing or coughing during mea ls. Routine blood work will include checking your A1c and B12 levels.  Your Providers PCP: Cone Bernardino MATSU, MD,  [phone]) Referring Provider: Cone Bernardino MATSU, MD,  [phone]) Care Team Provider: Roz Anes, MD,  [phone]) Care Team Provider: Colon Shove, MD,  [phone]) Care Team Provider: Dyane Rush, MD,  [phone]) Care Team Provider: Francyne Headland, MD,  [phone])  NEXT STEPS: [x]  Early Intervention: Schedule sooner appointment, call our on-call services, or go to emergency room if there is any significant Increase in pain or discomfort New or worsening symptoms Sudden or severe changes in your health [x]  Flexible Follow-Up: We recommend a No follow-ups on file. for optimal routine care. This allows for progress  monitoring and treatment adjustments. [x]   Preventive Care: Schedule your annual preventive care visit! It's typically covered by insurance and helps identify potential health issues early. [x]  Lab & X-ray Appointments: Incomplete tests scheduled today, or call to schedule. X-rays: [facility] (M-F, 8:30am-noon or 1pm-5pm). [x]  Medical Information Release: Sign a release form at front desk to obtain relevant medical information we don't have.  MAKING THE MOST OF OUR FOCUSED 20 MINUTE APPOINTMENTS: [x]   Clearly state your top concerns at the beginning of the visit to focus our discussion [x]   If you anticipate you will need more time, please inform the front desk during scheduling - we can book multiple appointments in the same week. [x]   If you have transportation problems- use our convenient video appointments or ask about transportation support. [x]   We can get down to business faster if you use MyChart to update information before the visit and submit non-urgent questions before your visit. Thank you for taking the time to provide details through MyChart.  Let our nurse know and she can import this information into your encounter documents.  Arrival and Wait Times: [x]   Arriving on time ensures that everyone receives prompt attention. [x]   Early morning (8a) and afternoon (1p) appointments tend to have shortest wait times. [x]   Unfortunately, we cannot delay appointments for late arrivals or hold slots during phone calls.  Getting Answers and Following Up [x]   Simple Questions & Concerns: For quick questions or basic follow-up after your visit, reach us  at [phone] or MyChart messaging. [x]   Complex Concerns: If your concern is more complex, scheduling an appointment might be best. Discuss this with the staff to find the most suitable option. [x]   Lab & Imaging Results: We'll contact you directly if results are abnormal or you don't use MyChart. Most normal results will be  on MyChart within 2-3 business days, with a review message from Dr. Jesus. Haven't heard back in 2 weeks? Need results sooner? Contact us  at [phone]. [x]   Referrals: Our referral coordinator will manage specialist referrals. The specialist's office should contact you within 2 weeks to schedule an appointment. Call us  if you haven't heard from them after 2 weeks.  Staying Connected [x]   MyChart: Activate your MyChart for the fastest way to access results and message us . See the last page of this paperwork for instructions on how to activate.  Bring to Your Next Appointment [x]   Medications: Please bring all your medication bottles to your next appointment to ensure we have an accurate record of your prescriptions. [x]   Health Diaries: If you're monitoring any health conditions at home, keeping a diary of your readings can be very helpful for discussions at your next appointment.  Billing [x]   X-ray & Lab Orders: These are billed by separate companies. Contact the invoicing company directly for questions or concerns. [x]   Visit Charges: Discuss any billing inquiries with our administrative services team.  Your Satisfaction Matters [x]   Share Your Experience: We strive for your satisfaction! If you have any complaints, or preferably compliments, please let Dr. Jesus know directly or contact our Practice Administrators, Manuelita Rubin or [employer], by asking at the front desk.   Reviewing Your Records [x]   Review this early draft of your clinical encounter notes below and the final encounter summary tomorrow on MyChart after its been completed.  All orders placed so far are visible here: Numbness and tingling in left arm -     Ambulatory referral to Neurology -  MR BRAIN W WO CONTRAST; Future  Dizziness -     Ambulatory referral to Neurology -     MR BRAIN W WO CONTRAST; Future  Ataxia -     Ambulatory referral to Neurology -     MR BRAIN W WO CONTRAST; Future  B12  deficiency -     Ambulatory referral to Neurology -     B12 and Folate Panel -     Methylmalonic acid, serum  Peripheral arterial disease (HCC) -     CBC with Differential/Platelet -     Comprehensive metabolic panel with GFR -     Lipid panel -     MR BRAIN W WO CONTRAST; Future  Obstructive sleep apnea syndrome  Chronic cerebral ischemia -     Ambulatory referral to Neurology -     Ozempic  (0.25 or 0.5 MG/DOSE); Inject 0.25 mg into the skin once a week.  Dispense: 3 mL; Refill: 11 -     MR BRAIN W WO CONTRAST; Future  DISH (diffuse idiopathic skeletal hyperostosis)  Dysphagia causing pulmonary aspiration with swallowing -     MR BRAIN W WO CONTRAST; Future  CKD stage 2 due to type 2 diabetes mellitus (HCC)  Hyperlipidemia associated with type 2 diabetes mellitus (HCC)  Type 2 diabetes mellitus with stage 2 chronic kidney disease, unspecified whether long term insulin  use (HCC) -     CBC with Differential/Platelet -     Comprehensive metabolic panel with GFR -     Lipid panel -     Hemoglobin A1c -     Microalbumin / creatinine urine ratio -     Ozempic  (0.25 or 0.5 MG/DOSE); Inject 0.25 mg into the skin once a week.  Dispense: 3 mL; Refill: 11 -     MR BRAIN W WO CONTRAST; Future  History of TIA (transient ischemic attack) -     Ozempic  (0.25 or 0.5 MG/DOSE); Inject 0.25 mg into the skin once a week.  Dispense: 3 mL; Refill: 11 -     MR BRAIN W WO CONTRAST; Future

## 2024-02-15 LAB — TSH+FREE T4: TSH W/REFLEX TO FT4: 1.56 m[IU]/L (ref 0.40–4.50)

## 2024-02-18 ENCOUNTER — Encounter: Payer: Self-pay | Admitting: Internal Medicine

## 2024-02-18 ENCOUNTER — Ambulatory Visit: Payer: Self-pay | Admitting: Internal Medicine

## 2024-02-18 LAB — METHYLMALONIC ACID, SERUM: Methylmalonic Acid, Quant: 138 nmol/L (ref 85–423)

## 2024-02-19 NOTE — Telephone Encounter (Signed)
 read by Marquelle R Pequignot at 9:20PM on 02/18/2024

## 2024-02-26 ENCOUNTER — Telehealth: Payer: Self-pay

## 2024-02-26 NOTE — Telephone Encounter (Signed)
 Copied from CRM [id number]. Topic: Referral - Status >> Feb 26, 2024  2:18 PM Franky GRADE wrote: Reason for CRM: Patient's spouse is calling because a referral for Neurology was placed and was informed that since he is established with [facility] since he is established in the practice.  Called and spoke with pt wife heron via phone about referral she stated she had not heard from anyone so she called over to the neurology we referred him to and they stated since he was seen at [facility] years ago should just go there. Pt wife stated to resend the referral there so I sent message out to the referral team to get that sent over to [facility].

## 2024-02-28 ENCOUNTER — Ambulatory Visit
Admission: RE | Admit: 2024-02-28 | Discharge: 2024-02-28 | Disposition: A | Discharge status: Home / Self Care | Source: Ambulatory Visit | Attending: Internal Medicine

## 2024-02-28 DIAGNOSIS — I739 Peripheral vascular disease, unspecified: Secondary | ICD-10-CM

## 2024-02-28 DIAGNOSIS — R42 Dizziness and giddiness: Secondary | ICD-10-CM

## 2024-02-28 DIAGNOSIS — R413 Other amnesia: Secondary | ICD-10-CM | POA: Diagnosis not present

## 2024-02-28 DIAGNOSIS — E1122 Type 2 diabetes mellitus with diabetic chronic kidney disease: Secondary | ICD-10-CM

## 2024-02-28 DIAGNOSIS — I6782 Cerebral ischemia: Secondary | ICD-10-CM

## 2024-02-28 DIAGNOSIS — R1319 Other dysphagia: Secondary | ICD-10-CM

## 2024-02-28 DIAGNOSIS — Z8673 Personal history of transient ischemic attack (TIA), and cerebral infarction without residual deficits: Secondary | ICD-10-CM

## 2024-02-28 DIAGNOSIS — R270 Ataxia, unspecified: Secondary | ICD-10-CM

## 2024-02-28 DIAGNOSIS — R200 Anesthesia of skin: Secondary | ICD-10-CM

## 2024-02-28 MED ORDER — GADOPICLENOL 0.5 MMOL/ML IV SOLN
7.5000 mL | Freq: Once | INTRAVENOUS | Status: AC | PRN
  Administered 2024-02-28: 7.5 mL via INTRAVENOUS

## 2024-03-01 ENCOUNTER — Other Ambulatory Visit: Payer: Self-pay | Admitting: Internal Medicine

## 2024-03-01 ENCOUNTER — Other Ambulatory Visit: Payer: Self-pay | Admitting: Nurse Practitioner

## 2024-03-01 DIAGNOSIS — R42 Dizziness and giddiness: Secondary | ICD-10-CM

## 2024-03-01 DIAGNOSIS — G459 Transient cerebral ischemic attack, unspecified: Secondary | ICD-10-CM

## 2024-03-13 ENCOUNTER — Other Ambulatory Visit: Payer: Self-pay

## 2024-03-13 ENCOUNTER — Emergency Department (HOSPITAL_BASED_OUTPATIENT_CLINIC_OR_DEPARTMENT_OTHER)

## 2024-03-13 ENCOUNTER — Emergency Department (HOSPITAL_BASED_OUTPATIENT_CLINIC_OR_DEPARTMENT_OTHER)
Admission: EM | Admit: 2024-03-13 | Discharge: 2024-03-13 | Disposition: A | Discharge status: Home / Self Care | Attending: Emergency Medicine | Admitting: Emergency Medicine

## 2024-03-13 DIAGNOSIS — I129 Hypertensive chronic kidney disease with stage 1 through stage 4 chronic kidney disease, or unspecified chronic kidney disease: Secondary | ICD-10-CM | POA: Insufficient documentation

## 2024-03-13 DIAGNOSIS — I6523 Occlusion and stenosis of bilateral carotid arteries: Secondary | ICD-10-CM | POA: Diagnosis not present

## 2024-03-13 DIAGNOSIS — I2510 Atherosclerotic heart disease of native coronary artery without angina pectoris: Secondary | ICD-10-CM | POA: Insufficient documentation

## 2024-03-13 DIAGNOSIS — R404 Transient alteration of awareness: Secondary | ICD-10-CM | POA: Insufficient documentation

## 2024-03-13 DIAGNOSIS — R4182 Altered mental status, unspecified: Secondary | ICD-10-CM | POA: Diagnosis present

## 2024-03-13 DIAGNOSIS — Z794 Long term (current) use of insulin: Secondary | ICD-10-CM | POA: Diagnosis not present

## 2024-03-13 DIAGNOSIS — E119 Type 2 diabetes mellitus without complications: Secondary | ICD-10-CM | POA: Insufficient documentation

## 2024-03-13 DIAGNOSIS — Z7984 Long term (current) use of oral hypoglycemic drugs: Secondary | ICD-10-CM | POA: Insufficient documentation

## 2024-03-13 DIAGNOSIS — E039 Hypothyroidism, unspecified: Secondary | ICD-10-CM | POA: Diagnosis not present

## 2024-03-13 DIAGNOSIS — R531 Weakness: Secondary | ICD-10-CM | POA: Diagnosis not present

## 2024-03-13 DIAGNOSIS — N189 Chronic kidney disease, unspecified: Secondary | ICD-10-CM | POA: Insufficient documentation

## 2024-03-13 DIAGNOSIS — R29898 Other symptoms and signs involving the musculoskeletal system: Secondary | ICD-10-CM

## 2024-03-13 DIAGNOSIS — Z79899 Other long term (current) drug therapy: Secondary | ICD-10-CM | POA: Insufficient documentation

## 2024-03-13 DIAGNOSIS — R300 Dysuria: Secondary | ICD-10-CM | POA: Insufficient documentation

## 2024-03-13 DIAGNOSIS — I6782 Cerebral ischemia: Secondary | ICD-10-CM | POA: Diagnosis not present

## 2024-03-13 LAB — URINALYSIS, ROUTINE W REFLEX MICROSCOPIC
Bilirubin Urine: NEGATIVE
Glucose, UA: NEGATIVE mg/dL
Hgb urine dipstick: NEGATIVE
Ketones, ur: NEGATIVE mg/dL
Leukocytes,Ua: NEGATIVE
Nitrite: NEGATIVE
Protein, ur: NEGATIVE mg/dL
Specific Gravity, Urine: 1.009 (ref 1.005–1.030)
pH: 6.5 (ref 5.0–8.0)

## 2024-03-13 LAB — BASIC METABOLIC PANEL WITH GFR
Anion gap: 13 (ref 5–15)
BUN: 17 mg/dL (ref 8–23)
CO2: 24 mmol/L (ref 22–32)
Calcium: 10 mg/dL (ref 8.9–10.3)
Chloride: 103 mmol/L (ref 98–111)
Creatinine, Ser: 1.12 mg/dL (ref 0.61–1.24)
GFR, Estimated: >60 mL/min (ref >60)
Glucose, Bld: 114 mg/dL — ABNORMAL HIGH (ref 70–99)
Potassium: 4.8 mmol/L (ref 3.5–5.1)
Sodium: 140 mmol/L (ref 135–145)

## 2024-03-13 LAB — HEPATIC FUNCTION PANEL
ALT: 14 U/L (ref 0–44)
AST: 23 U/L (ref 15–41)
Albumin: 4.5 g/dL (ref 3.5–5.0)
Alkaline Phosphatase: 57 U/L (ref 38–126)
Bilirubin, Direct: 0.2 mg/dL (ref 0.0–0.2)
Indirect Bilirubin: 0.3 mg/dL (ref 0.3–0.9)
Total Bilirubin: 0.5 mg/dL (ref 0.0–1.2)
Total Protein: 7.1 g/dL (ref 6.5–8.1)

## 2024-03-13 LAB — CBC WITH DIFFERENTIAL/PLATELET
Abs Immature Granulocytes: 0.01 K/uL (ref 0.00–0.07)
Basophils Absolute: 0 K/uL (ref 0.0–0.1)
Basophils Relative: 0 %
Eosinophils Absolute: 0.2 K/uL (ref 0.0–0.5)
Eosinophils Relative: 4 %
HCT: 38.1 % — ABNORMAL LOW (ref 39.0–52.0)
Hemoglobin: 12.7 g/dL — ABNORMAL LOW (ref 13.0–17.0)
Immature Granulocytes: 0 %
Lymphocytes Relative: 39 %
Lymphs Abs: 2 K/uL (ref 0.7–4.0)
MCH: 34 pg (ref 26.0–34.0)
MCHC: 33.3 g/dL (ref 30.0–36.0)
MCV: 102.1 fL — ABNORMAL HIGH (ref 80.0–100.0)
Monocytes Absolute: 0.5 K/uL (ref 0.1–1.0)
Monocytes Relative: 9 %
Neutro Abs: 2.4 K/uL (ref 1.7–7.7)
Neutrophils Relative %: 48 %
Platelets: 152 K/uL (ref 150–400)
RBC: 3.73 MIL/uL — ABNORMAL LOW (ref 4.22–5.81)
RDW: 12.8 % (ref 11.5–15.5)
WBC: 5.1 K/uL (ref 4.0–10.5)
nRBC: 0 % (ref 0.0–0.2)

## 2024-03-13 LAB — CBG MONITORING, ED: Glucose-Capillary: 102 mg/dL — ABNORMAL HIGH (ref 70–99)

## 2024-03-13 NOTE — ED Provider Notes (Signed)
 [place] [facility] AT [facility] Provider Note   CSN: [id number] Arrival date & time: 03/13/24  1456     Patient presents with: Altered Mental Status   Joshua Higgins is a 78 y.o. male.   Altered Mental Status Presenting symptoms: confusion   Associated symptoms: weakness   Patient is an 78 year old male presents the ED today for concerns for transient bouts of altered mental status and bilateral lower extremity weakness that has been intermittent x 2 days.  Yesterday's episode consisted of him having difficult time with remembering his wife's name as well as his age but did not have any physical deficits, noted to be ambulating and moving all extremities without difficulty lasting approximately 30 minutes.  Today's episode lasted approximately 30 minutes as well where he reported he had felt incredibly weak in bilateral lower extremities, requiring his wife to help but did not report any confusion today.  Is currently back to baseline, with strength returned.  Endorses intermittent episodes of dysuria starting this past week.  Previous medical history of Diabetes, CAD, hypothyroidism, anemia, HTN, migraines, TIAs, GERD, CKD, emphysema, chronic dizziness/vertigo associated with several falls.  Denies falls, headache, fever, blurry vision, vertigo, diplopia, tinnitus, hearing loss, dysphagia, odynophagia, cough, congestion, chest pain, shortness of breath, abdominal pain, nausea, vomiting, diarrhea, hematuria, lower leg swelling.     Prior to Admission medications   Medication Sig Start Date End Date Taking? Authorizing Provider  atenolol  (TENORMIN ) 50 MG tablet Take 50 mg by mouth daily.    [provider]  Blood Glucose Monitoring Suppl DEVI Use kit to check blood sugar once a day 09/03/18   Jeanine Knee, NP  Cholecalciferol  125 MCG (5000 UT) TABS Take 1 tablet (5,000 Units total) by mouth at bedtime. 09/20/23   Jesus Bernardino MATSU, MD  clopidogrel   (PLAVIX ) 75 MG tablet TAKE 1 TABLET BY MOUTH EVERY DAY 03/01/24   Jesus Bernardino MATSU, MD  Cyanocobalamin  (B-12) 5000 MCG SUBL Place 1 tablet under the tongue daily.    [provider]  ezetimibe  (ZETIA ) 10 MG tablet Take 1 tablet (10 mg total) by mouth daily. 09/20/23   Jesus Bernardino MATSU, MD  glucose blood (CONTOUR NEXT TEST) test strip USE TO TEST BLOOD SUGAR ONCE DAILY 03/09/23   Wilkinson, Dana E, NP  Lancets MISC Use to test blood sugar once daily 03/10/22   Cranford, Tonya, NP  levothyroxine  (SYNTHROID ) 50 MCG tablet TAKE 1 TABLET BY MOUTH DAILY ON AN EMPTY STOMACH WITH ONLY WATER FOR 30 MINUTES & NO ANTACID MEDS, CALCIUM  OR MAGNESIUM FOR 4 HOURS & AVOID BIOTIN 09/20/23   Jesus Bernardino MATSU, MD  metFORMIN  (GLUCOPHAGE -XR) 500 MG 24 hr tablet TAKE 2 TABLETS BY MOUTH 2 TIMES PER DAY WITH MEALS FOR DIABETES 09/20/23   Jesus Bernardino MATSU, MD  Multiple Vitamin (MULTIVITAMIN) tablet Take 1 tablet by mouth at bedtime.     [provider]  Omega-3 Fatty Acids (FISH OIL ) 1000 MG CAPS Take 1 capsule (1,000 mg total) by mouth at bedtime. 09/20/23   Jesus Bernardino MATSU, MD  rosuvastatin  (CRESTOR ) 10 MG tablet Take 1 tablet (10 mg total) by mouth See admin instructions. Take one tablet by mouth twice a week Sundays and Wednesdays 09/20/23   Jesus Bernardino MATSU, MD  Semaglutide ,0.25 or 0.5MG /DOS, (OZEMPIC , 0.25 OR 0.5 MG/DOSE,) 2 MG/3ML SOPN Inject 0.25 mg into the skin once a week. 02/14/24   Jesus Bernardino MATSU, MD    Allergies: Gemfibrozil , Ace inhibitors, and Lipitor [atorvastatin]  Review of Systems  Neurological:  Positive for weakness.  Psychiatric/Behavioral:  Positive for confusion.   All other systems reviewed and are negative.   Updated Vital Signs BP 121/65 (BP Location: Right Arm)   Pulse 71   Temp 98.3 F (36.8 C) (Oral)   Resp 18   SpO2 100%   Physical Exam Vitals and nursing note reviewed.  Constitutional:      General: He is not in acute distress.    Appearance: Normal appearance. He  is not ill-appearing or diaphoretic.  HENT:     Head: Normocephalic and atraumatic.  Eyes:     General: No scleral icterus.       Right eye: No discharge.        Left eye: No discharge.     Extraocular Movements: Extraocular movements intact.     Conjunctiva/sclera: Conjunctivae normal.     Pupils: Pupils are equal, round, and reactive to light.  Cardiovascular:     Rate and Rhythm: Normal rate and regular rhythm.     Pulses: Normal pulses.     Heart sounds: Normal heart sounds. No murmur heard.    No friction rub. No gallop.  Pulmonary:     Effort: Pulmonary effort is normal. No respiratory distress.     Breath sounds: No stridor. No wheezing, rhonchi or rales.  Chest:     Chest wall: No tenderness.  Abdominal:     General: Abdomen is flat. There is no distension.     Palpations: Abdomen is soft.     Tenderness: There is no abdominal tenderness. There is no right CVA tenderness, left CVA tenderness, guarding or rebound.  Musculoskeletal:        General: No swelling, deformity or signs of injury.     Cervical back: Normal range of motion. No rigidity.     Right lower leg: No edema.     Left lower leg: No edema.  Skin:    General: Skin is warm and dry.     Findings: No bruising, erythema or lesion.  Neurological:     General: No focal deficit present.     Mental Status: He is alert and oriented to person, place, and time. Mental status is at baseline.     Cranial Nerves: No cranial nerve deficit.     Sensory: No sensory deficit.     Motor: No weakness.     Coordination: Coordination normal.     Comments: No facial asymmetry, no ataxia, no apraxia, no aphasia, no arm drift, normal coordination with finger-to-nose, normal sensation to both upper and lower extremities bilaterally, normal grip strength bilaterally, normal strength to both flexion and extension to both upper lower extremities 5+ bilaterally, no visual field deficits, no nystagmus.   Psychiatric:        Mood and  Affect: Mood normal.     (all labs ordered are listed, but only abnormal results are displayed) Labs Reviewed  CBC WITH DIFFERENTIAL/PLATELET - Abnormal; Notable for the following components:      Result Value   RBC 3.73 (*)    Hemoglobin 12.7 (*)    HCT 38.1 (*)    MCV 102.1 (*)    All other components within normal limits  BASIC METABOLIC PANEL WITH GFR - Abnormal; Notable for the following components:   Glucose, Bld 114 (*)    All other components within normal limits  CBG MONITORING, ED - Abnormal; Notable for the following components:   Glucose-Capillary 102 (*)    All  other components within normal limits  URINALYSIS, ROUTINE W REFLEX MICROSCOPIC  HEPATIC FUNCTION PANEL    EKG: EKG Interpretation Date/Time:  Wednesday March 13 2024 15:08:10 EDT Ventricular Rate:  69 PR Interval:  186 QRS Duration:  102 QT Interval:  383 QTC Calculation: 411 R Axis:   -25  Text Interpretation: Sinus rhythm Left ventricular hypertrophy Probable anterior infarct, age indeterminate Since last tracing rate faster Confirmed by Randol Simmonds [phone]) on 03/13/2024 3:56:56 PM  Radiology: CT Head Wo Contrast Result Date: 03/13/2024 CLINICAL DATA:  Altered level of consciousness EXAM: CT HEAD WITHOUT CONTRAST TECHNIQUE: Contiguous axial images were obtained from the base of the skull through the vertex without intravenous contrast. RADIATION DOSE REDUCTION: This exam was performed according to the departmental dose-optimization program which includes automated exposure control, adjustment of the mA and/or kV according to patient size and/or use of iterative reconstruction technique. COMPARISON:  02/28/2024, 03/16/2023 FINDINGS: Brain: Chronic small vessel ischemic changes are again seen throughout the periventricular white matter. No evidence of acute infarct or hemorrhage. Lateral ventricles and midline structures appear unremarkable. No acute extra-axial fluid collections. No mass effect. Vascular:  Stable atherosclerosis of the internal carotid arteries. No hyperdense vessel. Skull: Normal. Negative for fracture or focal lesion. Sinuses/Orbits: No acute finding. Other: None. IMPRESSION: 1. No acute intracranial process. Chronic small vessel ischemic changes as above. Electronically Signed   By: Ozell Daring M.D.   On: 03/13/2024 17:16    Procedures   Medications Ordered in the ED - No data to display                               Medical Decision Making  This patient is a 78 year old male with spouse who presents to the ED for concern of transient episodes of altered mental status as well as bilateral lower extremity weakness.  Noted to have episode of transient confusion lasting approximate 30 minutes yesterday where he had difficult time recalling wife's name and his age but did not have any physical deficits at that time.  Today's episode happened after lunch, noting that he had significant bilateral lower extremity weakness but otherwise no cognitive deficits.  Significant previous history of vertigo, falls, diabetes, hypothyroidism, TIAs, CKD, chronic vertigo/dizziness associated with several falls.  Noted to have recent MRI done 8/28 which did not show any acute findings but did show severe chronic small vessel ischemia.  On physical exam, patient is in no acute distress, afebrile, alert and orient x 4, speaking in full sentences, nontachypneic, nontachycardic.  Normal neuroexam.  LCTAB, RRR, no lower leg edema.  Abdomen is nontender, no CVA tenderness.  Unremarkable exam otherwise.  Back to baseline.  The patient appears to be back to baseline, with no reported focal weakness noted on exam.  With reported episodes of dysuria, will obtain a UA, labs as well as evaluate with head CT, though lower suspicion with initial presentation of bilateral lower extremity weakness.  Lab work and CT imaging were unremarkable for any acute findings.  Low suspicion for CVA at this time with recent  MRI as well as unremarkable CT today.  Believe he will need to follow-up sooner with neurology than his December appointment.  Will have him call office and schedule an ER follow-up as well as reaching out to PCP to see if they can expedite their appointment.  Case was discussed with attending who agreed with plan.  Patient vital signs have remained stable  throughout the course of patient's time in the ED. Low suspicion for any other emergent pathology at this time. I believe this patient is safe to be discharged. Provided strict return to ER precautions. Patient expressed agreement and understanding of plan. All questions were answered.  Differential diagnoses prior to evaluation: The emergent differential diagnosis includes, but is not limited to,  Drug-related, hypoxia, hyper/hypoglycemia, encephalopathy, sepsis, DKA/HHS, brain lesion, CVA, seizure, environmental, psychiatric . This is not an exhaustive differential.   Past Medical History / Co-morbidities / Social History: Diabetes, CAD, hypothyroidism, anemia, HTN, migraines, TIAs, GERD, CKD, emphysema, chronic dizziness/vertigo associated with several falls  Additional history: Chart reviewed. Pertinent results include:   Noted to have recent MRI of brain on 02/28/2024 with and without contrast for numbness and tingling showing no acute intracranial abnormality, with slight progression of severe chronic small vessel ischemic disease and moderate cerebral atrophy.  Last seen on 02/14/2024 by internal medicine for chronic dizziness and gait disturbance characterized by vertigo and lightheadedness without a clear trigger.  Noted to have multifactorial possible causes including PAD with vertebral circulation impairment, chronic small vessel ischemic disease, severe spinal stenosis not amenable by surgery per surgeon, and occasional ear pain.  Notably does have chronic numbness to left arm following skin cancer surgery.  Lab Tests/Imaging  studies: I personally interpreted labs/imaging and the pertinent results include:   CBC Note near baseline hemoglobin was 12.7 BMP unremarkable Hepatic panel unremarkable UA unremarkable CT shows chronic findings without any acute findings and otherwise unremarkable.   I agree with the radiologist interpretation.  Cardiac monitoring: EKG obtained and interpreted by myself and attending physician which shows: Sinus rhythm with LVH  EKG Interpretation Date/Time:  Wednesday March 13 2024 15:08:10 EDT Ventricular Rate:  69 PR Interval:  186 QRS Duration:  102 QT Interval:  383 QTC Calculation: 411 R Axis:   -25  Text Interpretation: Sinus rhythm Left ventricular hypertrophy Probable anterior infarct, age indeterminate Since last tracing rate faster Confirmed by Randol Simmonds [phone]) on 03/13/2024 3:56:56 PM          Medications:  I have reviewed the patients home medicines and have made adjustments as needed.  Critical Interventions: None  Social Determinants of Health: Is at home with spouse who helps monitor him as well as has neurology follow-up scheduled in December.  Disposition: After consideration of the diagnostic results and the patients response to treatment, I feel that the patient would benefit from discharge treatment as above.   emergency department workup does not suggest an emergent condition requiring admission or immediate intervention beyond what has been performed at this time. The plan is: Follow-up with neurology, follow-up PCP, return for any new or recurrent symptoms. The patient is safe for discharge and has been instructed to return immediately for worsening symptoms, change in symptoms or any other concerns.  Final diagnoses:  Complaints of weakness of lower extremity  Altered awareness, transient    ED Discharge Orders     None          Beola Terrall GORMAN DEVONNA 03/13/24 1743    Randol Simmonds, MD 03/15/24 [phone]

## 2024-03-13 NOTE — Discharge Instructions (Addendum)
 You were seen today for complaints of lower extremity weakness as well as also altered awareness that seems to be transient.  With you having had a recent MRI that was reassuring as well as reassuring CT and lab work and physical exam today, low suspicion for any emergent causes of your symptoms.  However I do believe that you will need to follow-up with neurology sooner than your scheduled appointment in December.  Recommend you call their office and schedule an ER follow-up as well as call contact your PCP to see if they can contact neurology to see if you can get seen sooner.  Return to the ED though if you continue any recurrent or worsening symptoms.

## 2024-03-13 NOTE — ED Triage Notes (Signed)
 Wife reports patient was altered yesterday to the point of not knowing her name or his age. Baseline A&Ox4. Today, he states he has weird feeling in head

## 2024-03-13 NOTE — ED Provider Notes (Incomplete)
 I provided a substantive portion of the care of this patient.  I personally made/approved the management plan for this patient and take responsibility for the patient management. {Remember to document shared critical care using "edcritical" dot phrase:1}

## 2024-03-14 ENCOUNTER — Encounter: Payer: Self-pay | Admitting: Internal Medicine

## 2024-03-18 ENCOUNTER — Telehealth: Payer: Self-pay

## 2024-03-18 NOTE — Telephone Encounter (Signed)
 Transition Care Management Unsuccessful Follow-up Telephone Call  Date of discharge and from where:  03/18/2024 [facility]  Attempts:  1st Attempt  Reason for unsuccessful TCM follow-up call:  Left voice message for patient to complete [facility] call regarding recent ED visit. Advised to return call and schedule ED follow up with PCP. Please schedule patient for ED follow up as instructed in discharge.

## 2024-03-21 ENCOUNTER — Encounter: Payer: Self-pay | Admitting: Internal Medicine

## 2024-03-21 ENCOUNTER — Telehealth: Payer: Self-pay

## 2024-03-21 ENCOUNTER — Ambulatory Visit (INDEPENDENT_AMBULATORY_CARE_PROVIDER_SITE_OTHER): Admitting: Internal Medicine

## 2024-03-21 ENCOUNTER — Other Ambulatory Visit (HOSPITAL_COMMUNITY): Payer: Self-pay

## 2024-03-21 ENCOUNTER — Telehealth: Payer: Self-pay | Admitting: Internal Medicine

## 2024-03-21 VITALS — BP 118/70 | HR 66 | Temp 98.2°F | Ht 70.0 in | Wt 168.2 lb

## 2024-03-21 DIAGNOSIS — I6782 Cerebral ischemia: Secondary | ICD-10-CM | POA: Diagnosis not present

## 2024-03-21 DIAGNOSIS — G43101 Migraine with aura, not intractable, with status migrainosus: Secondary | ICD-10-CM | POA: Diagnosis not present

## 2024-03-21 DIAGNOSIS — I10 Essential (primary) hypertension: Secondary | ICD-10-CM

## 2024-03-21 DIAGNOSIS — R42 Dizziness and giddiness: Secondary | ICD-10-CM

## 2024-03-21 MED ORDER — ATENOLOL 100 MG PO TABS: 100.0000 mg | ORAL_TABLET | Freq: Every day | ORAL | 3 refills | Status: DC

## 2024-03-21 NOTE — Patient Instructions (Addendum)
 SABRA

## 2024-03-21 NOTE — Telephone Encounter (Unsigned)
 Pharmacy Patient Advocate Encounter   Received notification from Pt Calls Messages that prior authorization for Ozempic  auto-injectors is required/requested.   Insurance verification completed.   The patient is insured through [facility] .   Per test claim: The current 28 day co-pay is, $133.78.  No PA needed at this time. This test claim was processed through [facility]- copay amounts may vary at other pharmacies due to pharmacy/plan contracts, or as the patient moves through the different stages of their insurance plan.

## 2024-03-21 NOTE — Telephone Encounter (Signed)
 Pt is asking about prior auth for his Semaglutide ,0.25 or 0.5MG /DOS, (OZEMPIC , 0.25 OR 0.5 MG/DOSE,) 2 MG/3ML SOPN [[id number]] .

## 2024-03-21 NOTE — Progress Notes (Signed)
 ==============================  [facility]: [phone]   -- Medical Office Visit --  Patient: Joshua Higgins      Age: 78 y.o.       Sex:  male  Date:   03/21/2024 Today's Healthcare Provider: Bernardino KANDICE Cone, MD  ==============================   Chief Complaint: Hospitalization Follow-up History: CAD (CABG), carotid atherosclerosis, PAD, chronic cerebral ischemia, TIA, cervical myelopathy, severe cervical spinal stenosis, migraine with aura, macrocytic anemia, CKD stage 2, type 2 diabetes (well controlled), hypothyroidism, sleep apnea, GERD, IBS, emphysema, vitamin D  deficiency, history of Mohs surgery, chronic dizziness/gait disturbance, sensory ataxia, dysphagia (stable, penetration without aspiration), numbness L arm (post skin cancer surgery).  Discussed the use of AI scribe software for clinical note transcription with the patient, who gave verbal consent to proceed.  History of Present Illness 78 year old male with migraines who presents with visual disturbances and dizziness. He is accompanied by his wife, Heron.  He experiences visual disturbances described as 'pinpoint vision' with a swirling blue motion, which occurred last week at bedtime and resolved after sleep. He also has episodes of blurry vision, which improved with topiramate  but caused significant dizziness.  He has a history of migraines and was previously on atenolol , which he believes helped his migraines for years. However, he was switched to topiramate  and verapamil , which caused dizziness and chest discomfort, respectively, leading to discontinuation. He recently attempted to restart topiramate  at a lower dose, but it again caused dizziness, so he stopped it.  He recalls an ER visit where he experienced confusion and weakness, with limited memory of the event. His wife noted he could not remember her name during this episode. The patient and his family did not observe any  seizure-like activity during this time.  He mentions a history of neck surgery and believes his symptoms may be related to neck issues. He experiences dizziness with certain neck movements and has a strong heartbeat that he feels affects his vision.  He does not drive due to his symptoms and is cautious about activities that could exacerbate his dizziness, such as climbing ladders.  Recent episode of transient neurological symptoms (confusion, difficulty recalling wifes name, transient bilateral lower extremity weakness), prompting ER visit. Ongoing chronic dizziness, visual disturbances, and gait instability. Recent Events (ER Visit 03/13/2024) Confusion: Difficulty recalling wifes name (~30 min), resolved. Weakness: Bilateral lower extremity weakness (~30 min), required assistance, resolved. No: Falls, headache, fever, blurry vision, diplopia, chest pain, nausea, vomiting, hematuria, leg swelling. UA: Normal; Labs: Mild macrocytic anemia (chronic), glucose mildly elevated, renal function stable. Imaging: CT Head: Chronic small vessel ischemic changes, no acute infarct/hemorrhage. MRI Brain (02/28/24): Severe chronic small vessel ischemic disease, moderate cerebral atrophy, chronic lacunar infarcts, severe cervical spinal stenosis (stable/slightly progressed). EKG: Sinus rhythm, LVH, probable old anterior infarct. Current Symptoms Migraines: Visual aura (pinpoint vision, swirling blue motion), transient, resolves with sleep. Dizziness: Persistent, worsened by neck movement, multifactorial (vascular, spinal, neuro). Gait disturbance: Ataxia, cannot walk heel-to-toe, requires support. Vision: Occasional blurry vision, improved with topiramate  but intolerable due to dizziness. No: Persistent focal neurological deficits, persistent visual field loss, seizure activity. Medication Intolerances Topiramate : Severe dizziness at any dose (including 25 mg); discontinued. Verapamil : Profound fatigue,  chest discomfort, CNS effects (head crazy); discontinued. Atenolol : Previously effective for migraine prophylaxis; currently at 50 mg daily, plan to increase to 100 mg daily.    Medication Intolerances Topiramate : Dizziness even at low doses (unable to tolerate) Verapamil : Severe fatigue (could not walk across  yard without sitting), makes his head crazy (likely CNS side effects)  Patient-Reported Symptoms  Headaches Super small keyhole tunnel vision (transient, resolved after sleep) Sleepiness during vision changes Cannot tolerate topiramate  (dizziness) Cannot tolerate verapamil  (fatigue, head crazy)    Background Reviewed: Problem List: has Type II diabetes mellitus with renal manifestations (HCC); Hyperlipidemia associated with type 2 diabetes mellitus (HCC); Hypertension; GERD (gastroesophageal reflux disease); IBS (irritable bowel syndrome); Vitamin D  deficiency; CAD (coronary artery disease) of artery bypass graft; Hypothyroidism; Atherosclerosis of abdominal aorta (HCC) by CXR in 2017 ; CKD stage 2 due to type 2 diabetes mellitus (HCC); Former smoker; History of Mohs micrographic surgery for skin cancer; Macrocytic anemia; B12 deficiency; History of TIA (transient ischemic attack); Cervical myelopathy (HCC); Carotid atherosclerosis; Emphysema lung (HCC); Vertigo; Migraine with aura and with status migrainosus, not intractable; Dysphagia causing pulmonary aspiration with swallowing; Peripheral arterial disease (HCC); Sleep apnea; HNP (herniated nucleus pulposus), cervical; Lumbosacral radiculopathy; Neck pain; Chronic cerebral ischemia; DISH (diffuse idiopathic skeletal hyperostosis); At maximum risk for fall; Sensory ataxia; Numbness and tingling in left arm; and Dizziness - Chronic, Persistent, Idiopathic, associated with severe falls and imbalance on their problem list. Past Medical History:  has a past medical history of Allergies, Anemia, Arthritis, Aspiration into airway  (09/20/2023), Cancer (HCC), Cataracts, bilateral, Coronary artery disease, COVID-19 (01/29/2021) (02/02/2021), Diabetes mellitus without complication (HCC), GERD (gastroesophageal reflux disease), Hepatitis, History of sleep apnea, Hyperlipidemia, Hypertension, Hypothyroidism, Migraine headache with aura, Prediabetes (03/12/2023), Sleep apnea, and TIA (transient ischemic attack) (07/2022). Past Surgical History:   has a past surgical history that includes Cardiac surgery; Knee surgery (Right); Back surgery; Coronary angioplasty (2001); Coronary artery bypass graft; Surgery scrotal / testicular; Elbow surgery (Left); Colonoscopy; Lumbar laminectomy/decompression microdiscectomy (Right, 08/04/2014); Cataract extraction, bilateral (Bilateral, 2018); Posterior cervical laminectomy (N/A, 08/03/2023); and Neck surgery (07/2023). Social History:   reports that he quit smoking about 50 years ago. His smoking use included cigarettes. He has never used smokeless tobacco. He reports that he does not drink alcohol and does not use drugs. Family History:  family history includes Arthritis in his mother; CAD in his brother and sister; Diabetes in his maternal grandmother and paternal grandmother; Heart attack in his brother and father; Heart disease in his brother, brother, brother, brother, father, sister, and sister; Hypertension in his brother; Ulcers in his mother. Allergies:  is allergic to gemfibrozil , ace inhibitors, and lipitor [atorvastatin].   Medication Reconciliation: Current Outpatient Medications on File Prior to Visit  Medication Sig   Blood Glucose Monitoring Suppl DEVI Use kit to check blood sugar once a day   Cholecalciferol  125 MCG (5000 UT) TABS Take 1 tablet (5,000 Units total) by mouth at bedtime.   clopidogrel  (PLAVIX ) 75 MG tablet TAKE 1 TABLET BY MOUTH EVERY DAY   Cyanocobalamin  (B-12) 5000 MCG SUBL Place 1 tablet under the tongue daily.   ezetimibe  (ZETIA ) 10 MG tablet Take 1 tablet (10 mg  total) by mouth daily.   glucose blood (CONTOUR NEXT TEST) test strip USE TO TEST BLOOD SUGAR ONCE DAILY   Lancets MISC Use to test blood sugar once daily   levothyroxine  (SYNTHROID ) 50 MCG tablet TAKE 1 TABLET BY MOUTH DAILY ON AN EMPTY STOMACH WITH ONLY WATER FOR 30 MINUTES & NO ANTACID MEDS, CALCIUM  OR MAGNESIUM FOR 4 HOURS & AVOID BIOTIN   metFORMIN  (GLUCOPHAGE -XR) 500 MG 24 hr tablet TAKE 2 TABLETS BY MOUTH 2 TIMES PER DAY WITH MEALS FOR DIABETES   Multiple Vitamin (MULTIVITAMIN) tablet Take 1 tablet by  mouth at bedtime.    Omega-3 Fatty Acids (FISH OIL ) 1000 MG CAPS Take 1 capsule (1,000 mg total) by mouth at bedtime.   rosuvastatin  (CRESTOR ) 10 MG tablet Take 1 tablet (10 mg total) by mouth See admin instructions. Take one tablet by mouth twice a week "Sundays and Wednesdays   Semaglutide,0.25 or 0.5MG/DOS, (OZEMPIC, 0.25 OR 0.5 MG/DOSE,) 2 MG/3ML SOPN Inject 0.25 mg into the skin once a week.   No current facility-administered medications on file prior to visit.   Medications Discontinued During This Encounter  Medication Reason   atenolol (TENORMIN) 50 MG tablet      Physical Exam:    03/21/2024    9:55 AM 03/13/2024    3:08 PM 02/14/2024   10:09 AM  Vitals with BMI  Height 5' 10  5' 10  Weight 168 lbs 3 oz  173 lbs 10 oz  BMI 24.13  24.91  Systolic 118 121 126  Diastolic 70 65 60  Pulse 66 71 59  Vital signs reviewed.  Nursing notes reviewed. Weight trend reviewed. Physical Activity: Not on file   General Appearance:  No acute distress appreciable.   Well-groomed, healthy-appearing male.  Well proportioned with no abnormal fat distribution.  Good muscle tone. Pulmonary:  Normal work of breathing at rest, no respiratory distress apparent. SpO2: 98 %  Musculoskeletal: All extremities are intact.  Neurological:  Awake, alert, oriented, and engaged.  No obvious focal neurological deficits or cognitive impairments.  Sensorium seems unclouded.   Speech is clear and coherent with  logical content. Psychiatric:  Appropriate mood, pleasant and cooperative demeanor, thoughtful and engaged during the exam   Verbalized to patient: Physical Exam NEUROLOGICAL: Cranial nerves grossly intact. Symmetrical strength. Peripheral vision intact.   Results:   Verbalized to patient: Results      11" /10/2022    9:53 PM 05/08/2023    9:47 PM 01/18/2023   11:09 AM 10/06/2022    9:51 PM  PHQ 2/9 Scores  PHQ - 2 Score 0 0 0 0    {   No results found for any visits on 03/21/24.} Admission on 03/13/2024, Discharged on 03/13/2024  Component Date Value Ref Range Status   Glucose-Capillary 03/13/2024 102 (H)  70 - 99 mg/dL Final   WBC 90/89/7974 5.1  4.0 - 10.5 K/uL Final   RBC 03/13/2024 3.73 (L)  4.22 - 5.81 MIL/uL Final   Hemoglobin 03/13/2024 12.7 (L)  13.0 - 17.0 g/dL Final   HCT 90/89/7974 38.1 (L)  39.0 - 52.0 % Final   MCV 03/13/2024 102.1 (H)  80.0 - 100.0 fL Final   MCH 03/13/2024 34.0  26.0 - 34.0 pg Final   MCHC 03/13/2024 33.3  30.0 - 36.0 g/dL Final   RDW 90/89/7974 12.8  11.5 - 15.5 % Final   Platelets 03/13/2024 152  150 - 400 K/uL Final   nRBC 03/13/2024 0.0  0.0 - 0.2 % Final   Neutrophils Relative % 03/13/2024 48  % Final   Neutro Abs 03/13/2024 2.4  1.7 - 7.7 K/uL Final   Lymphocytes Relative 03/13/2024 39  % Final   Lymphs Abs 03/13/2024 2.0  0.7 - 4.0 K/uL Final   Monocytes Relative 03/13/2024 9  % Final   Monocytes Absolute 03/13/2024 0.5  0.1 - 1.0 K/uL Final   Eosinophils Relative 03/13/2024 4  % Final   Eosinophils Absolute 03/13/2024 0.2  0.0 - 0.5 K/uL Final   Basophils Relative 03/13/2024 0  % Final   Basophils Absolute 03/13/2024  0.0  0.0 - 0.1 K/uL Final   Immature Granulocytes 03/13/2024 0  % Final   Abs Immature Granulocytes 03/13/2024 0.01  0.00 - 0.07 K/uL Final   Sodium 03/13/2024 140  135 - 145 mmol/L Final   Potassium 03/13/2024 4.8  3.5 - 5.1 mmol/L Final   Chloride 03/13/2024 103  98 - 111 mmol/L Final   CO2 03/13/2024 24  22 -  32 mmol/L Final   Glucose, Bld 03/13/2024 114 (H)  70 - 99 mg/dL Final   BUN 90/89/7974 17  8 - 23 mg/dL Final   Creatinine, Ser 03/13/2024 1.12  0.61 - 1.24 mg/dL Final   Calcium  03/13/2024 10.0  8.9 - 10.3 mg/dL Final   GFR, Estimated 03/13/2024 >60  >60 mL/min Final   Anion gap 03/13/2024 13  5 - 15 Final   Color, Urine 03/13/2024 YELLOW  YELLOW Final   APPearance 03/13/2024 CLEAR  CLEAR Final   Specific Gravity, Urine 03/13/2024 1.009  1.005 - 1.030 Final   pH 03/13/2024 6.5  5.0 - 8.0 Final   Glucose, UA 03/13/2024 NEGATIVE  NEGATIVE mg/dL Final   Hgb urine dipstick 03/13/2024 NEGATIVE  NEGATIVE Final   Bilirubin Urine 03/13/2024 NEGATIVE  NEGATIVE Final   Ketones, ur 03/13/2024 NEGATIVE  NEGATIVE mg/dL Final   Protein, ur 90/89/7974 NEGATIVE  NEGATIVE mg/dL Final   Nitrite 90/89/7974 NEGATIVE  NEGATIVE Final   Leukocytes,Ua 03/13/2024 NEGATIVE  NEGATIVE Final   Total Protein 03/13/2024 7.1  6.5 - 8.1 g/dL Final   Albumin 90/89/7974 4.5  3.5 - 5.0 g/dL Final   AST 90/89/7974 23  15 - 41 U/L Final   ALT 03/13/2024 14  0 - 44 U/L Final   Alkaline Phosphatase 03/13/2024 57  38 - 126 U/L Final   Total Bilirubin 03/13/2024 0.5  0.0 - 1.2 mg/dL Final   Bilirubin, Direct 03/13/2024 0.2  0.0 - 0.2 mg/dL Final   Indirect Bilirubin 03/13/2024 0.3  0.3 - 0.9 mg/dL Final  Office Visit on 02/14/2024  Component Date Value Ref Range Status   WBC 02/14/2024 5.3  4.0 - 10.5 K/uL Final   RBC 02/14/2024 3.87 (L)  4.22 - 5.81 Mil/uL Final   Hemoglobin 02/14/2024 13.0  13.0 - 17.0 g/dL Final   HCT 91/86/7974 39.2  39.0 - 52.0 % Final   MCV 02/14/2024 101.3 (H)  78.0 - 100.0 fl Final   MCHC 02/14/2024 33.1  30.0 - 36.0 g/dL Final   RDW 91/86/7974 13.8  11.5 - 15.5 % Final   Platelets 02/14/2024 181.0  150.0 - 400.0 K/uL Final   Neutrophils Relative % 02/14/2024 51.1  43.0 - 77.0 % Final   Lymphocytes Relative 02/14/2024 37.6  12.0 - 46.0 % Final   Monocytes Relative 02/14/2024 8.2  3.0 - 12.0 %  Final   Eosinophils Relative 02/14/2024 2.7  0.0 - 5.0 % Final   Basophils Relative 02/14/2024 0.4  0.0 - 3.0 % Final   Neutro Abs 02/14/2024 2.7  1.4 - 7.7 K/uL Final   Lymphs Abs 02/14/2024 2.0  0.7 - 4.0 K/uL Final   Monocytes Absolute 02/14/2024 0.4  0.1 - 1.0 K/uL Final   Eosinophils Absolute 02/14/2024 0.1  0.0 - 0.7 K/uL Final   Basophils Absolute 02/14/2024 0.0  0.0 - 0.1 K/uL Final   Sodium 02/14/2024 141  135 - 145 mEq/L Final   Potassium 02/14/2024 4.3  3.5 - 5.1 mEq/L Final   Chloride 02/14/2024 103  96 - 112 mEq/L Final   CO2 02/14/2024  30  19 - 32 mEq/L Final   Glucose, Bld 02/14/2024 106 (H)  70 - 99 mg/dL Final   BUN 91/86/7974 12  6 - 23 mg/dL Final   Creatinine, Ser 02/14/2024 0.96  0.40 - 1.50 mg/dL Final   Total Bilirubin 02/14/2024 0.7  0.2 - 1.2 mg/dL Final   Alkaline Phosphatase 02/14/2024 43  39 - 117 U/L Final   AST 02/14/2024 17  0 - 37 U/L Final   ALT 02/14/2024 14  0 - 53 U/L Final   Total Protein 02/14/2024 7.3  6.0 - 8.3 g/dL Final   Albumin 91/86/7974 4.5  3.5 - 5.2 g/dL Final   GFR 91/86/7974 74.78  >60.00 mL/min Final   Calcium  02/14/2024 9.5  8.4 - 10.5 mg/dL Final   Cholesterol 91/86/7974 119  0 - 200 mg/dL Final   Triglycerides 91/86/7974 107.0  0.0 - 149.0 mg/dL Final   HDL 91/86/7974 47.80  >39.00 mg/dL Final   VLDL 91/86/7974 21.4  0.0 - 40.0 mg/dL Final   LDL Cholesterol 02/14/2024 50  0 - 99 mg/dL Final   Total CHOL/HDL Ratio 02/14/2024 2   Final   NonHDL 02/14/2024 71.43   Final   Hgb A1c MFr Bld 02/14/2024 6.7 (H)  4.6 - 6.5 % Final   Microalb, Ur 02/14/2024 1.2  0.0 - 1.9 mg/dL Final   Creatinine,U 91/86/7974 57.2  mg/dL Final   Microalb Creat Ratio 02/14/2024 21.7  0.0 - 30.0 mg/g Final   Vitamin B-12 02/14/2024 >1500 (H)  211 - 911 pg/mL Final   Folate 02/14/2024 >23.4  >5.9 ng/mL Final   Methylmalonic Acid, Quant 02/14/2024 138  85 - 423 nmol/L Final   TSH W/REFLEX TO FT4 02/14/2024 1.56  0.40 - 4.50 mIU/L Final  Office Visit on  09/20/2023  Component Date Value Ref Range Status   Sodium 09/20/2023 140  135 - 145 mEq/L Final   Potassium 09/20/2023 4.5  3.5 - 5.1 mEq/L Final   Chloride 09/20/2023 101  96 - 112 mEq/L Final   CO2 09/20/2023 31  19 - 32 mEq/L Final   Glucose, Bld 09/20/2023 92  70 - 99 mg/dL Final   BUN 96/80/7974 13  6 - 23 mg/dL Final   Creatinine, Ser 09/20/2023 1.03  0.40 - 1.50 mg/dL Final   Total Bilirubin 09/20/2023 0.4  0.2 - 1.2 mg/dL Final   Alkaline Phosphatase 09/20/2023 46  39 - 117 U/L Final   AST 09/20/2023 19  0 - 37 U/L Final   ALT 09/20/2023 14  0 - 53 U/L Final   Total Protein 09/20/2023 7.2  6.0 - 8.3 g/dL Final   Albumin 96/80/7974 4.5  3.5 - 5.2 g/dL Final   GFR 96/80/7974 68.91  >60.00 mL/min Final   Calcium  09/20/2023 9.7  8.4 - 10.5 mg/dL Final   TSH W/REFLEX TO FT4 09/20/2023 1.62  0.40 - 4.50 mIU/L Final   Microalb, Ur 09/20/2023 0.7  0.0 - 1.9 mg/dL Final   Creatinine,U 96/80/7974 55.8  mg/dL Final   Microalb Creat Ratio 09/20/2023 Unable to calculate  0.0 - 30.0 mg/g Final   QUESTION/PROBLEM 09/20/2023    Final   UNCLEAR ORDER: 09/20/2023 833 NO LONGER AVAILABLE   Final   SPECIMEN(S) SUBMITTED 09/20/2023 LAV   Final  Admission on 08/03/2023, Discharged on 08/04/2023  Component Date Value Ref Range Status   Glucose-Capillary 08/03/2023 105 (H)  70 - 99 mg/dL Final   Sodium 98/69/7974 140  135 - 145 mmol/L Final   Potassium  08/03/2023 4.2  3.5 - 5.1 mmol/L Final   Chloride 08/03/2023 103  98 - 111 mmol/L Final   BUN 08/03/2023 18  8 - 23 mg/dL Final   Creatinine, Ser 08/03/2023 1.20  0.61 - 1.24 mg/dL Final   Glucose, Bld 98/69/7974 105 (H)  70 - 99 mg/dL Final   Calcium , Ion 08/03/2023 1.17  1.15 - 1.40 mmol/L Final   TCO2 08/03/2023 27  22 - 32 mmol/L Final   Hemoglobin 08/03/2023 12.9 (L)  13.0 - 17.0 g/dL Final   HCT 98/69/7974 38.0 (L)  39.0 - 52.0 % Final   Glucose-Capillary 08/03/2023 141 (H)  70 - 99 mg/dL Final   Glucose-Capillary 08/03/2023 240 (H)  70 -  99 mg/dL Final   Comment 1 98/69/7974 Notify RN   Final   Comment 2 08/03/2023 Document in Chart   Final   Glucose-Capillary 08/04/2023 115 (H)  70 - 99 mg/dL Final   Comment 1 98/68/7974 Notify RN   Final   Comment 2 08/04/2023 Document in Chart   Final  Hospital Outpatient Visit on 07/27/2023  Component Date Value Ref Range Status   Glucose-Capillary 07/27/2023 135 (H)  70 - 99 mg/dL Final   MRSA, PCR 98/76/7974 NEGATIVE  NEGATIVE Final   Staphylococcus aureus 07/27/2023 NEGATIVE  NEGATIVE Final   Hgb A1c MFr Bld 07/27/2023 6.2 (H)  4.8 - 5.6 % Final   Mean Plasma Glucose 07/27/2023 131.24  mg/dL Final   Sodium 98/76/7974 140  135 - 145 mmol/L Final   Potassium 07/27/2023 5.8 (H)  3.5 - 5.1 mmol/L Final   Chloride 07/27/2023 102  98 - 111 mmol/L Final   CO2 07/27/2023 28  22 - 32 mmol/L Final   Glucose, Bld 07/27/2023 137 (H)  70 - 99 mg/dL Final   BUN 98/76/7974 14  8 - 23 mg/dL Final   Creatinine, Ser 07/27/2023 1.19  0.61 - 1.24 mg/dL Final   Calcium  07/27/2023 10.0  8.9 - 10.3 mg/dL Final   Total Protein 98/76/7974 7.3  6.5 - 8.1 g/dL Final   Albumin 98/76/7974 4.2  3.5 - 5.0 g/dL Final   AST 98/76/7974 21  15 - 41 U/L Final   ALT 07/27/2023 17  0 - 44 U/L Final   Alkaline Phosphatase 07/27/2023 56  38 - 126 U/L Final   Total Bilirubin 07/27/2023 0.9  0.0 - 1.2 mg/dL Final   GFR, Estimated 07/27/2023 >60  >60 mL/min Final   Anion gap 07/27/2023 10  5 - 15 Final   WBC 07/27/2023 6.0  4.0 - 10.5 K/uL Final   RBC 07/27/2023 3.97 (L)  4.22 - 5.81 MIL/uL Final   Hemoglobin 07/27/2023 13.5  13.0 - 17.0 g/dL Final   HCT 98/76/7974 41.5  39.0 - 52.0 % Final   MCV 07/27/2023 104.5 (H)  80.0 - 100.0 fL Final   MCH 07/27/2023 34.0  26.0 - 34.0 pg Final   MCHC 07/27/2023 32.5  30.0 - 36.0 g/dL Final   RDW 98/76/7974 12.4  11.5 - 15.5 % Final   Platelets 07/27/2023 186  150 - 400 K/uL Final   nRBC 07/27/2023 0.0  0.0 - 0.2 % Final  Orders Only on 07/12/2023  Component Date Value Ref  Range Status   Fecal Occult Blood, POC 07/12/2023 Negative  Negative Final   Card #2 Fecal Occult Blod, POC 07/12/2023 Negative   Final   Card #3 Fecal Occult Blood, POC 07/12/2023 Negative   Final  Abstract on 05/24/2023  Component Date  Value Ref Range Status   HM Diabetic Eye Exam 05/15/2023 No Retinopathy  No Retinopathy Final  Office Visit on 05/08/2023  Component Date Value Ref Range Status   Color, Urine 05/08/2023 YELLOW  YELLOW Final   APPearance 05/08/2023 CLEAR  CLEAR Final   Specific Gravity, Urine 05/08/2023 1.015  1.001 - 1.035 Final   pH 05/08/2023 6.0  5.0 - 8.0 Final   Glucose, UA 05/08/2023 NEGATIVE  NEGATIVE Final   Bilirubin Urine 05/08/2023 NEGATIVE  NEGATIVE Final   Ketones, ur 05/08/2023 NEGATIVE  NEGATIVE Final   Hgb urine dipstick 05/08/2023 NEGATIVE  NEGATIVE Final   Protein, ur 05/08/2023 NEGATIVE  NEGATIVE Final   Nitrite 05/08/2023 NEGATIVE  NEGATIVE Final   Leukocytes,Ua 05/08/2023 NEGATIVE  NEGATIVE Final   Creatinine, Urine 05/08/2023 95  20 - 320 mg/dL Final   Microalb, Ur 88/95/7975 0.7  mg/dL Final   Microalb Creat Ratio 05/08/2023 7  <30 mg/g creat Final   Vitamin B-12 05/08/2023 1,273 (H)  200 - 1,100 pg/mL Final   PSA 05/08/2023 0.79  < OR = 4.00 ng/mL Final   WBC 05/08/2023 5.5  3.8 - 10.8 Thousand/uL Final   RBC 05/08/2023 3.63 (L)  4.20 - 5.80 Million/uL Final   Hemoglobin 05/08/2023 12.2 (L)  13.2 - 17.1 g/dL Final   HCT 88/95/7975 37.5 (L)  38.5 - 50.0 % Final   MCV 05/08/2023 103.3 (H)  80.0 - 100.0 fL Final   MCH 05/08/2023 33.6 (H)  27.0 - 33.0 pg Final   MCHC 05/08/2023 32.5  32.0 - 36.0 g/dL Final   RDW 88/95/7975 12.0  11.0 - 15.0 % Final   Platelets 05/08/2023 176  140 - 400 Thousand/uL Final   MPV 05/08/2023 9.4  7.5 - 12.5 fL Final   Neutro Abs 05/08/2023 2,855  1,500 - 7,800 cells/uL Final   Absolute Lymphocytes 05/08/2023 1,925  850 - 3,900 cells/uL Final   Absolute Monocytes 05/08/2023 506  200 - 950 cells/uL Final    Eosinophils Absolute 05/08/2023 193  15 - 500 cells/uL Final   Basophils Absolute 05/08/2023 22  0 - 200 cells/uL Final   Neutrophils Relative % 05/08/2023 51.9  % Final   Total Lymphocyte 05/08/2023 35.0  % Final   Monocytes Relative 05/08/2023 9.2  % Final   Eosinophils Relative 05/08/2023 3.5  % Final   Basophils Relative 05/08/2023 0.4  % Final   Glucose, Bld 05/08/2023 138  65 - 139 mg/dL Final   BUN 88/95/7975 17  7 - 25 mg/dL Final   Creat 88/95/7975 0.97  0.70 - 1.28 mg/dL Final   eGFR 88/95/7975 79  > OR = 60 mL/min/1.58m2 Final   BUN/Creatinine Ratio 05/08/2023 SEE NOTE:  6 - 22 (calc) Final   Sodium 05/08/2023 140  135 - 146 mmol/L Final   Potassium 05/08/2023 4.7  3.5 - 5.3 mmol/L Final   Chloride 05/08/2023 102  98 - 110 mmol/L Final   CO2 05/08/2023 31  20 - 32 mmol/L Final   Calcium  05/08/2023 9.7  8.6 - 10.3 mg/dL Final   Total Protein 88/95/7975 7.0  6.1 - 8.1 g/dL Final   Albumin 88/95/7975 4.3  3.6 - 5.1 g/dL Final   Globulin 88/95/7975 2.7  1.9 - 3.7 g/dL (calc) Final   AG Ratio 05/08/2023 1.6  1.0 - 2.5 (calc) Final   Total Bilirubin 05/08/2023 0.6  0.2 - 1.2 mg/dL Final   Alkaline phosphatase (APISO) 05/08/2023 62  35 - 144 U/L Final   AST 05/08/2023  15  10 - 35 U/L Final   ALT 05/08/2023 11  9 - 46 U/L Final   Magnesium 05/08/2023 1.9  1.5 - 2.5 mg/dL Final   Cholesterol 88/95/7975 125  <200 mg/dL Final   HDL 88/95/7975 48  > OR = 40 mg/dL Final   Triglycerides 88/95/7975 163 (H)  <150 mg/dL Final   LDL Cholesterol (Calc) 05/08/2023 53  mg/dL (calc) Final   Total CHOL/HDL Ratio 05/08/2023 2.6  <4.9 (calc) Final   Non-HDL Cholesterol (Calc) 05/08/2023 77  <130 mg/dL (calc) Final   TSH 88/95/7975 2.12  0.40 - 4.50 mIU/L Final   Hgb A1c MFr Bld 05/08/2023 6.4 (H)  <5.7 % of total Hgb Final   Mean Plasma Glucose 05/08/2023 137  mg/dL Final   eAG (mmol/L) 88/95/7975 7.6  mmol/L Final   Insulin  05/08/2023 49.3 (H)  uIU/mL Final   Vit D, 25-Hydroxy 05/08/2023 76   30 - 100 ng/mL Final  Office Visit on 04/10/2023  Component Date Value Ref Range Status   Glucose, Bld 04/10/2023 99  65 - 99 mg/dL Final   BUN 89/92/7975 20  7 - 25 mg/dL Final   Creat 89/92/7975 0.94  0.70 - 1.28 mg/dL Final   eGFR 89/92/7975 82  > OR = 60 mL/min/1.29m2 Final   BUN/Creatinine Ratio 04/10/2023 SEE NOTE:  6 - 22 (calc) Final   Sodium 04/10/2023 140  135 - 146 mmol/L Final   Potassium 04/10/2023 4.7  3.5 - 5.3 mmol/L Final   Chloride 04/10/2023 104  98 - 110 mmol/L Final   CO2 04/10/2023 25  20 - 32 mmol/L Final   Calcium  04/10/2023 9.7  8.6 - 10.3 mg/dL Final  Office Visit on 03/24/2023  Component Date Value Ref Range Status   WBC 03/24/2023 5.6  3.8 - 10.8 Thousand/uL Final   RBC 03/24/2023 3.82 (L)  4.20 - 5.80 Million/uL Final   Hemoglobin 03/24/2023 12.5 (L)  13.2 - 17.1 g/dL Final   HCT 90/79/7975 39.8  38.5 - 50.0 % Final   MCV 03/24/2023 104.2 (H)  80.0 - 100.0 fL Final   MCH 03/24/2023 32.7  27.0 - 33.0 pg Final   MCHC 03/24/2023 31.4 (L)  32.0 - 36.0 g/dL Final   RDW 90/79/7975 11.6  11.0 - 15.0 % Final   Platelets 03/24/2023 288  140 - 400 Thousand/uL Final   MPV 03/24/2023 9.4  7.5 - 12.5 fL Final   Neutro Abs 03/24/2023 2,990  1,500 - 7,800 cells/uL Final   Lymphs Abs 03/24/2023 1,865  850 - 3,900 cells/uL Final   Absolute Monocytes 03/24/2023 543  200 - 950 cells/uL Final   Eosinophils Absolute 03/24/2023 162  15 - 500 cells/uL Final   Basophils Absolute 03/24/2023 39  0 - 200 cells/uL Final   Neutrophils Relative % 03/24/2023 53.4  % Final   Total Lymphocyte 03/24/2023 33.3  % Final   Monocytes Relative 03/24/2023 9.7  % Final   Eosinophils Relative 03/24/2023 2.9  % Final   Basophils Relative 03/24/2023 0.7  % Final   Glucose, Bld 03/24/2023 158 (H)  65 - 99 mg/dL Final   BUN 90/79/7975 20  7 - 25 mg/dL Final   Creat 90/79/7975 1.05  0.70 - 1.28 mg/dL Final   eGFR 90/79/7975 72  > OR = 60 mL/min/1.35m2 Final   BUN/Creatinine Ratio 03/24/2023 SEE  NOTE:  6 - 22 (calc) Final   Sodium 03/24/2023 139  135 - 146 mmol/L Final   Potassium 03/24/2023 5.7 (H)  3.5 - 5.3 mmol/L Final   Chloride 03/24/2023 101  98 - 110 mmol/L Final   CO2 03/24/2023 28  20 - 32 mmol/L Final   Calcium  03/24/2023 10.0  8.6 - 10.3 mg/dL Final   Total Protein 90/79/7975 7.0  6.1 - 8.1 g/dL Final   Albumin 90/79/7975 4.2  3.6 - 5.1 g/dL Final   Globulin 90/79/7975 2.8  1.9 - 3.7 g/dL (calc) Final   AG Ratio 03/24/2023 1.5  1.0 - 2.5 (calc) Final   Total Bilirubin 03/24/2023 0.5  0.2 - 1.2 mg/dL Final   Alkaline phosphatase (APISO) 03/24/2023 78  35 - 144 U/L Final   AST 03/24/2023 13  10 - 35 U/L Final   ALT 03/24/2023 12  9 - 46 U/L Final  There may be more visits with results that are not included.  No image results found. CT Head Wo Contrast Result Date: 03/13/2024 CLINICAL DATA:  Altered level of consciousness EXAM: CT HEAD WITHOUT CONTRAST TECHNIQUE: Contiguous axial images were obtained from the base of the skull through the vertex without intravenous contrast. RADIATION DOSE REDUCTION: This exam was performed according to the departmental dose-optimization program which includes automated exposure control, adjustment of the mA and/or kV according to patient size and/or use of iterative reconstruction technique. COMPARISON:  02/28/2024, 03/16/2023 FINDINGS: Brain: Chronic small vessel ischemic changes are again seen throughout the periventricular white matter. No evidence of acute infarct or hemorrhage. Lateral ventricles and midline structures appear unremarkable. No acute extra-axial fluid collections. No mass effect. Vascular: Stable atherosclerosis of the internal carotid arteries. No hyperdense vessel. Skull: Normal. Negative for fracture or focal lesion. Sinuses/Orbits: No acute finding. Other: None. IMPRESSION: 1. No acute intracranial process. Chronic small vessel ischemic changes as above. Electronically Signed   By: Ozell Daring M.D.   On: 03/13/2024 17:16    MR Brain W Wo Contrast Result Date: 02/28/2024 EXAM: MRI BRAIN WITH AND WITHOUT CONTRAST 02/28/2024 02:36:46 PM TECHNIQUE: Multiplanar multisequence MRI of the head/brain was performed with and without the administration of intravenous contrast. COMPARISON: Head CT 03/16/2023 and MRI 07/19/2022. CLINICAL HISTORY: Memory loss. FINDINGS: BRAIN AND VENTRICLES: No acute infarct. No acute intracranial hemorrhage. No mass effect or midline shift. No hydrocephalus. The sella is unremarkable. Normal flow voids. No abnormal enhancement. Confluent T2 hyperintensities in the cerebral white matter, slightly progressed from the prior MRI and nonspecific but compatible with severe chronic small vessel ischemic disease. Milder chronic small vessel changes in the pons. Chronic lacunar infarcts in the left basal ganglia and thalamus. Moderate cerebral atrophy, unchanged and without clear lobar predominance. ORBITS: Bilateral cataract extraction. SINUSES: No acute abnormality. BONES AND SOFT TISSUES: Prominent ligamentous thickening/pannus formation posterior to the dens resulting in spinal stenosis and mild mass effect on the cervicomedullary junction, stable to slightly progressed. IMPRESSION: 1. No acute intracranial abnormality. 2. Slight progression of severe chronic small vessel ischemic disease. 3. Moderate cerebral atrophy. Electronically signed by: Dasie Hamburg MD 02/28/2024 02:53 PM EDT RP Workstation: [id number]         ASSESSMENT & PLAN   Key Problems Chronic dizziness/gait disturbance: Multifactorial (vascular, spinal, neuro), severe fall risk. Migraine with visual aura: Medication intolerances (topiramate , verapamil ); atenolol  partially effective. Transient neurological episodes: TIA vs migraine vs small vessel ischemia vs metabolic vs positional/spinal vs medication effect. Macrocytic anemia: Not explained by B12/folate; possible myelodysplasia vs chronic disease. Severe cervical spinal stenosis: Not  amenable to surgery; possible impact on CSF dynamics and neurological symptoms. Stable dysphagia: Penetration without aspiration; no pneumonia/cough;  exercises ongoing.     Additional Notes Medication changes: Topiramate  and verapamil  discontinued due to intolerable CNS side effects. Atenolol : Will increase to 100 mg daily; previously effective for migraine, safe to trial higher dose. Fall precautions: No driving, no ladder use, home safety emphasized, strong family support. Ophthalmology: Consider expedited evaluation for new visual symptoms (not routine check). Hematology: Consider referral if macrocytic anemia worsens or additional cytopenias develop. Plan Expedite neurology evaluation (referral sent; detailed summary included). Increase atenolol  to 100 mg daily for migraine prophylaxis. Continue current vascular and metabolic management. Monitor for recurrence of visual or neurological symptoms. Reinforce fall prevention strategies. Family/caregiver education and support ongoing. Attachments Recent imaging reports (CT, MRI) Recent labs (CBC, BMP, A1c, B12, Vitamin D , etc.) Medication reconciliation Problem list and summary table If you need a patient education handout, medication review, or specific documentation template for the next visit, let me know!  Assessment & Plan Migraine with aura and with status migrainosus, not intractable Primary hypertension Agreed to increase atenolol  which is felt to help his symptom(s)   Migraine with visual disturbance and adverse effects of topiramate  and verapamil    He experiences migraines with visual disturbances, such as tunnel and pinpoint vision, possibly linked to medication effects. Topiramate  and verapamil  caused adverse effects, including dizziness and chest discomfort. Topiramate  was discontinued due to significant dizziness, and verapamil  was stopped due to fatigue and dizziness. Atenolol , previously effective for migraines, may  have lost efficacy over time. Increasing the atenolol  dosage to 100 mg daily is considered safe and worth trying due to its past effectiveness. He will be referred to a neurologist for further evaluation and management of migraines. Chronic cerebral ischemia Chronic cerebral ischemia may contribute to neurological symptoms like dizziness and visual disturbances. Symptoms may be worsened by medication effects and anatomical changes in the neck. Monitoring for any new or worsening neurological symptoms is advised, and the response to the increased atenolol  dosage will be observed. Dizziness - Chronic, Persistent, Idiopathic, associated with severe falls and imbalance Chronic dizziness is potentially related to cerebrospinal fluid pressure changes due to neck issues, exacerbated by certain neck movements complicated by prior neck surgeries.  There is no clear evidence of stroke or seizure activity. Dizziness may be linked to previous neck surgery and anatomical changes. The theory of cerebrospinal fluid pressure changes was discussed, suggesting neck movement may cause pressure fluctuations affecting the cerebellum, leading to dizziness. Surgery for neck issues is advised against due to risks. He is encouraged to practice safe measures to prevent falls, such as avoiding ladders and ensuring a safe environment.   ORDER ASSOCIATIONS  #   DIAGNOSIS / CONDITION ICD-10 ENCOUNTER ORDER     ICD-10-CM   1. Primary hypertension  I10 atenolol  (TENORMIN ) 100 MG tablet    2. Migraine with aura and with status migrainosus, not intractable  G43.101     3. Chronic cerebral ischemia  I67.82     4. Dizziness - Chronic, Persistent, Idiopathic, associated with severe falls and imbalance  R42            Orders Placed in Encounter:   Meds ordered this encounter  Medications   atenolol  (TENORMIN ) 100 MG tablet    Sig: Take 1 tablet (100 mg total) by mouth daily.    Dispense:  90 tablet    Refill:  3    This  document was synthesized by artificial intelligence ([employer]) using HIPAA-compliant recording of the clinical interaction;   We discussed the use of AI scribe  software for clinical note transcription with the patient, who gave verbal consent to proceed. additional Info: This encounter employed state-of-the-art, real-time, collaborative documentation. The patient actively reviewed and assisted in updating their electronic medical record on a shared screen, ensuring transparency and facilitating joint problem-solving for the problem list, overview, and plan. This approach promotes accurate, informed care. The treatment plan was discussed and reviewed in detail, including medication safety, potential side effects, and all patient questions. We confirmed understanding and comfort with the plan. Follow-up instructions were established, including contacting the office for any concerns, returning if symptoms worsen, persist, or new symptoms develop, and precautions for potential emergency department visits.

## 2024-03-22 NOTE — Assessment & Plan Note (Signed)
 Chronic cerebral ischemia may contribute to neurological symptoms like dizziness and visual disturbances. Symptoms may be worsened by medication effects and anatomical changes in the neck. Monitoring for any new or worsening neurological symptoms is advised, and the response to the increased atenolol  dosage will be observed.

## 2024-03-22 NOTE — Assessment & Plan Note (Signed)
 Agreed to increase atenolol  which is felt to help his symptom(s)   Migraine with visual disturbance and adverse effects of topiramate  and verapamil    He experiences migraines with visual disturbances, such as tunnel and pinpoint vision, possibly linked to medication effects. Topiramate  and verapamil  caused adverse effects, including dizziness and chest discomfort. Topiramate  was discontinued due to significant dizziness, and verapamil  was stopped due to fatigue and dizziness. Atenolol , previously effective for migraines, may have lost efficacy over time. Increasing the atenolol  dosage to 100 mg daily is considered safe and worth trying due to its past effectiveness. He will be referred to a neurologist for further evaluation and management of migraines.

## 2024-03-22 NOTE — Assessment & Plan Note (Signed)
 Chronic dizziness is potentially related to cerebrospinal fluid pressure changes due to neck issues, exacerbated by certain neck movements complicated by prior neck surgeries.  There is no clear evidence of stroke or seizure activity. Dizziness may be linked to previous neck surgery and anatomical changes. The theory of cerebrospinal fluid pressure changes was discussed, suggesting neck movement may cause pressure fluctuations affecting the cerebellum, leading to dizziness. Surgery for neck issues is advised against due to risks. He is encouraged to practice safe measures to prevent falls, such as avoiding ladders and ensuring a safe environment.

## 2024-03-27 ENCOUNTER — Telehealth: Payer: Self-pay | Admitting: Neurology

## 2024-03-27 ENCOUNTER — Encounter: Payer: Self-pay | Admitting: Neurology

## 2024-03-27 ENCOUNTER — Other Ambulatory Visit: Payer: Self-pay | Admitting: Internal Medicine

## 2024-03-27 ENCOUNTER — Other Ambulatory Visit: Payer: Self-pay | Admitting: Nurse Practitioner

## 2024-03-27 ENCOUNTER — Ambulatory Visit (INDEPENDENT_AMBULATORY_CARE_PROVIDER_SITE_OTHER): Admitting: Neurology

## 2024-03-27 VITALS — BP 147/67 | HR 70 | Ht 70.0 in | Wt 172.0 lb

## 2024-03-27 DIAGNOSIS — M4810 Ankylosing hyperostosis [Forestier], site unspecified: Secondary | ICD-10-CM | POA: Diagnosis not present

## 2024-03-27 DIAGNOSIS — M4802 Spinal stenosis, cervical region: Secondary | ICD-10-CM | POA: Diagnosis not present

## 2024-03-27 DIAGNOSIS — E1122 Type 2 diabetes mellitus with diabetic chronic kidney disease: Secondary | ICD-10-CM

## 2024-03-27 DIAGNOSIS — R269 Unspecified abnormalities of gait and mobility: Secondary | ICD-10-CM | POA: Diagnosis not present

## 2024-03-27 DIAGNOSIS — R42 Dizziness and giddiness: Secondary | ICD-10-CM | POA: Diagnosis not present

## 2024-03-27 DIAGNOSIS — Z9181 History of falling: Secondary | ICD-10-CM | POA: Diagnosis not present

## 2024-03-27 DIAGNOSIS — G629 Polyneuropathy, unspecified: Secondary | ICD-10-CM

## 2024-03-27 NOTE — Progress Notes (Signed)
 [facility]  PATIENT: Joshua Higgins DOB: 08/02/1945  REQUESTING CLINICIAN: Jesus Bernardino MATSU, MD HISTORY FROM: Patient and spouse  REASON FOR VISIT: Chronic dizziness    HISTORICAL  CHIEF COMPLAINT:  Chief Complaint  Patient presents with   Consult    Pt in room 13. Wife in room. Internal referral for dizziness.    HISTORY OF PRESENT ILLNESS:  Discussed the use of AI scribe software for clinical note transcription with the patient, who gave verbal consent to proceed.  Joshua Higgins is an 78 year old male with history of chronic dizziness. hypertension, hyperlipidemia, hypothyroidism, diabetes and small vessel disease who presents with persistent dizziness and balance issues. He was referred for evaluation of his chronic dizziness and balance issues.  He has experienced chronic dizziness for over fifty years, described as a sensation of 'swimming ahead' or the 'whole room spinning', present almost all the time. The dizziness worsens at times, making him feel very unsteady, especially when getting up from a lying position. He had one fall earlier this year in January, where he fell backwards while carrying soft drinks, but did not sustain any serious injury.  He has a history of small vessel disease, diagnosed after imaging showed chronic small vessel disease with white spots on the brain. He also has cervical spine disease, with fusion of the cervical vertebrae from C2 to C6, limiting his range of motion and contributing to his dizziness. He underwent neck surgery to relieve pressure on the spinal cord, but the dizziness persisted.  He experiences migraines characterized by visual disturbances without pain. Initially on atenolol , which helped with the migraines, he developed tolerance. He was then prescribed verapamil  and topiramate , which increased his dizziness. Recently, his atenolol  dosage was increased, reducing the visual disturbances associated with his  migraines.  He has neuropathy affecting his legs and feet, contributing to his unsteadiness. He underwent two back surgeries for bulging discs pressing on the sciatic nerve, which relieved his leg pain. However, he still experiences some discomfort in the area of the surgical scar.  He participates in a senior exercise class once a week, which includes balance exercises, and finds it helpful for maintaining leg strength. He monitors his blood pressure daily, which usually ranges from 120-130/60-80 mmHg. He acknowledges needing to increase his water intake, as he tends to drink soda at home and water when dining out.  He has limited neck mobility due to diffuse idiopathic skeletal hyperostosis (DISH).       OTHER MEDICAL CONDITIONS: Hypertension, Hyperlipidemia, Hypothyroidism, Chronic dizziness   REVIEW OF SYSTEMS: Full 14 system review of systems performed and negative with exception of: As noted in the HPI   ALLERGIES: Allergies  Allergen Reactions   Gemfibrozil  Other (See Comments)    Muscle aches   Ace Inhibitors Cough   Lipitor [Atorvastatin] Other (See Comments)    Joint pain.    HOME MEDICATIONS: Outpatient Medications Prior to Visit  Medication Sig Dispense Refill   atenolol  (TENORMIN ) 100 MG tablet Take 1 tablet (100 mg total) by mouth daily. 90 tablet 3   Blood Glucose Monitoring Suppl DEVI Use kit to check blood sugar once a day 1 each 0   Cholecalciferol  125 MCG (5000 UT) TABS Take 1 tablet (5,000 Units total) by mouth at bedtime. 90 tablet 3   clopidogrel  (PLAVIX ) 75 MG tablet TAKE 1 TABLET BY MOUTH EVERY DAY 90 tablet 1   CONTOUR NEXT TEST test strip USE TO TEST BLOOD SUGAR ONCE DAILY  100 strip 12   Cyanocobalamin  (B-12) 5000 MCG SUBL Place 1 tablet under the tongue daily.     ezetimibe  (ZETIA ) 10 MG tablet Take 1 tablet (10 mg total) by mouth daily. 90 tablet 2   Lancets MISC Use to test blood sugar once daily 200 each 3   levothyroxine  (SYNTHROID ) 50 MCG tablet  TAKE 1 TABLET BY MOUTH DAILY ON AN EMPTY STOMACH WITH ONLY WATER FOR 30 MINUTES & NO ANTACID MEDS, CALCIUM  OR MAGNESIUM FOR 4 HOURS & AVOID BIOTIN 90 tablet 3   metFORMIN  (GLUCOPHAGE -XR) 500 MG 24 hr tablet TAKE 2 TABLETS BY MOUTH 2 TIMES PER DAY WITH MEALS FOR DIABETES 360 tablet 1   Multiple Vitamin (MULTIVITAMIN) tablet Take 1 tablet by mouth at bedtime.      Omega-3 Fatty Acids (FISH OIL ) 1000 MG CAPS Take 1 capsule (1,000 mg total) by mouth at bedtime. 90 capsule 3   rosuvastatin  (CRESTOR ) 10 MG tablet Take 1 tablet (10 mg total) by mouth See admin instructions. Take one tablet by mouth twice a week Sundays and Wednesdays 26 tablet 3   Semaglutide ,0.25 or 0.5MG /DOS, (OZEMPIC , 0.25 OR 0.5 MG/DOSE,) 2 MG/3ML SOPN Inject 0.25 mg into the skin once a week. 3 mL 11   No facility-administered medications prior to visit.    PAST MEDICAL HISTORY: Past Medical History:  Diagnosis Date   Allergies    Anemia    low iron   Arthritis    Aspiration into airway 09/20/2023   Aspiration into airway (J69.0) #Resolved #StudyNegative   STATUS: 11/29/2023: Modified barium swallow study definitively showed NO ASPIRATION during swallowing evaluation. Patient demonstrates vestibular penetration only, which does not constitute true aspiration into the airway. Previous clinical suspicion of aspiration was based on symptoms but not confirmed on objective testing.   CLINICAL CONTE   Cancer (HCC)    skin cancer on face   Cataracts, bilateral    Coronary artery disease    CABG - 2008   COVID-19 (01/29/2021) 02/02/2021   Diabetes mellitus without complication (HCC)    type II   GERD (gastroesophageal reflux disease)    Hepatitis    History of sleep apnea    Hyperlipidemia    Hypertension    pt states he does not have HTN, takes Atenolol  for migraines   Hypothyroidism    Migraine headache with aura    takes Atenolol    Prediabetes 03/12/2023   Sleep apnea    does not use cpap   TIA (transient ischemic  attack) 07/2022    PAST SURGICAL HISTORY: Past Surgical History:  Procedure Laterality Date   BACK SURGERY     CARDIAC SURGERY     Bypass   CATARACT EXTRACTION, BILATERAL Bilateral 2018   Dr. Roz   COLONOSCOPY     CORONARY ANGIOPLASTY  2001   CORONARY ARTERY BYPASS GRAFT     ELBOW SURGERY Left    due to damaged nerve   KNEE SURGERY Right    arthroscopic   LUMBAR LAMINECTOMY/DECOMPRESSION MICRODISCECTOMY Right 08/04/2014   Procedure: Right Lumbar four-five Microdiskectomy;  Surgeon: Victory Gens, MD;  Location: [facility];  Service: Neurosurgery;  Laterality: Right;  Right L4-5 Microdiskectomy   NECK SURGERY  07/2023   POSTERIOR CERVICAL LAMINECTOMY N/A 08/03/2023   Procedure: Posterior Cervical Laminectomy and Decompresson Cervical One-Cervical Two;  Surgeon: Gens Victory, MD;  Location: [facility];  Service: Neurosurgery;  Laterality: N/A;  C3   SURGERY SCROTAL / TESTICULAR     as a child/teenager  FAMILY HISTORY: Family History  Problem Relation Age of Onset   Ulcers Mother    Arthritis Mother    Heart disease Father    Heart attack Father    Heart disease Sister    Heart disease Sister    CAD Sister    Heart disease Brother    Heart disease Brother    Heart attack Brother    CAD Brother    Hypertension Brother    Heart disease Brother    Heart disease Brother    Diabetes Maternal Grandmother    Diabetes Paternal Grandmother     SOCIAL HISTORY: Social History   Socioeconomic History   Marital status: Married    Spouse name: Not on file   Number of children: Not on file   Years of education: Not on file   Highest education level: Not on file  Occupational History   Not on file  Tobacco Use   Smoking status: Former    Current packs/day: 0.00    Types: Cigarettes    Quit date: 10/01/1973    Years since quitting: 50.5   Smokeless tobacco: Never  Vaping Use   Vaping status: Never Used  Substance and Sexual Activity   Alcohol use: No   Drug use: No    Sexual activity: Not Currently  Other Topics Concern   Not on file  Social History Narrative   Not on file   Social Drivers of Health   Financial Resource Strain: Not on file  Food Insecurity: No Food Insecurity (07/19/2022)   Hunger Vital Sign    Worried About Running Out of Food in the Last Year: Never true    Ran Out of Food in the Last Year: Never true  Transportation Needs: No Transportation Needs (07/19/2022)   PRAPARE - Administrator, Civil Service (Medical): No    Lack of Transportation (Non-Medical): No  Physical Activity: Not on file  Stress: Not on file  Social Connections: Not on file  Intimate Partner Violence: Not At Risk (07/19/2022)   Humiliation, Afraid, Rape, and Kick questionnaire    Fear of Current or Ex-Partner: No    Emotionally Abused: No    Physically Abused: No    Sexually Abused: No    PHYSICAL EXAM  GENERAL EXAM/CONSTITUTIONAL: Vitals:  Vitals:   03/27/24 1359  BP: (!) 147/67  Pulse: 70  Weight: 172 lb (78 kg)  Height: 5' 10 (1.778 m)   Body mass index is 24.68 kg/m. Wt Readings from Last 3 Encounters:  03/27/24 172 lb (78 kg)  03/21/24 168 lb 3.2 oz (76.3 kg)  02/14/24 173 lb 9.6 oz (78.7 kg)   Orthostatic Vitals for the past 48 hrs (Last 6 readings):  Patient Position BP Pulse BP Location Cuff Size Patient Position (if appropriate) BP- Standing at 0 minutes Pulse- Standing at 0 minutes BP- Lying Pulse- Lying  03/27/24 1359 Sitting (!) 147/67 70 Left Arm Normal -- -- -- -- --  03/27/24 1400 -- -- -- -- -- Orthostatic Vitals 144/72 75 147/80 74    Patient is in no distress; well developed, nourished and groomed; neck is supple  MUSCULOSKELETAL: Gait, strength, tone, movements noted in Neurologic exam below  NEUROLOGIC: MENTAL STATUS:     03/24/2023   11:25 AM  MMSE - Mini Mental State Exam  Orientation to time 2  Orientation to Place 5  Registration 3  Attention/ Calculation 0  Recall 2  Language- name 2 objects  2  Language- repeat  1  Language- follow 3 step command 3  Language- read & follow direction 1  Write a sentence 0  Copy design 0  Total score 19   awake, alert, oriented to person, place and time recent and remote memory intact normal attention and concentration language fluent, comprehension intact, naming intact fund of knowledge appropriate  CRANIAL NERVE:  2nd, 3rd, 4th, 6th - Visual fields full to confrontation, extraocular muscles intact, no nystagmus 5th - facial sensation symmetric 7th - facial strength symmetric 8th - hearing intact 9th - palate elevates symmetrically, uvula midline 11th - shoulder shrug symmetric 12th - tongue protrusion midline  MOTOR:  normal bulk and tone, full strength in the BUE, BLE  SENSORY:  normal and symmetric to light touch  COORDINATION:  finger-nose-finger, fine finger movements normal  GAIT/STATION:  Unsteady, unable to tandem walk.      DIAGNOSTIC DATA (LABS, IMAGING, TESTING) - I reviewed patient records, labs, notes, testing and imaging myself where available.  Lab Results  Component Value Date   WBC 5.1 03/13/2024   HGB 12.7 (L) 03/13/2024   HCT 38.1 (L) 03/13/2024   MCV 102.1 (H) 03/13/2024   PLT 152 03/13/2024      Component Value Date/Time   NA 140 03/13/2024 1543   K 4.8 03/13/2024 1543   CL 103 03/13/2024 1543   CO2 24 03/13/2024 1543   GLUCOSE 114 (H) 03/13/2024 1543   BUN 17 03/13/2024 1543   CREATININE 1.12 03/13/2024 1543   CREATININE 0.97 05/08/2023 1058   CALCIUM  10.0 03/13/2024 1543   PROT 7.1 03/13/2024 1605   ALBUMIN 4.5 03/13/2024 1605   AST 23 03/13/2024 1605   ALT 14 03/13/2024 1605   ALKPHOS 57 03/13/2024 1605   BILITOT 0.5 03/13/2024 1605   GFRNONAA >60 03/13/2024 1543   GFRNONAA 72 10/22/2020 1213   GFRAA 83 10/22/2020 1213   Lab Results  Component Value Date   CHOL 119 02/14/2024   HDL 47.80 02/14/2024   LDLCALC 50 02/14/2024   TRIG 107.0 02/14/2024   CHOLHDL 2 02/14/2024    Lab Results  Component Value Date   HGBA1C 6.7 (H) 02/14/2024   Lab Results  Component Value Date   VITAMINB12 >1500 (H) 02/14/2024   Lab Results  Component Value Date   TSH 2.12 05/08/2023    MRI Brain 02/28/2024 1. No acute intracranial abnormality. 2. Slight progression of severe chronic small vessel ischemic disease. 3. Moderate cerebral atrophy.  MRI Cervical spine 05/11/2023 Solid bridging osteophyte beginning at C2 throughout the cervical and upper thoracic region consistent with diffuse idiopathic skeletal hyperostosis.  The spine is fused through this region. Mark hypertrophic arthropathy at the of the still mobile C1-C2 articulation, encroaching upon the spinal canal.  AP diameter of the canal at this level is only 8 mm.  There is effacement of the subarachnoid space surrounding the cord at this level.  There is very slight deformity of the cord but no abnormal T2 signal.   ASSESSMENT AND PLAN  79 y.o. year old male with chronic dizziness, hypertension, hyperlipidemia, hypothyroidism, who presents for evaluation of his chronic dizziness.  Chronic dizziness associated with cervical diffuse idiopathic skeletal hyperostosis and chronic small vessel disease Chronic dizziness for over 50 years, worsened by cervical spine disease. Cervical diffuse idiopathic skeletal hyperostosis (DISH) in some cases can also cause dizziness. HIs advance chronic small vessel disease is likely contributing to gait instability. Cervical spine limited range of motion can contribute to dizziness. Peripheral neuropathy can contribute to  gait abnormalities. No evidence of hydrocephalus based on brain MRI. Informed patient and spouse that his dizziness is multifactorial and due to causes listed above.  - Refer to physical therapy for balance training and assessment of need for assistive device - Encourage increased fluid intake to 48 ounces of water daily  High risk for falls High risk for falls due  to chronic dizziness, peripheral neuropathy, and cervical spine issues. History of a fall earlier this year. Engages in balance exercises once a week, which has been beneficial for leg strength. - Refer to physical therapy for balance training and fall risk assessment - Encourage use of assistive devices as needed  Peripheral neuropathy Peripheral neuropathy contributing to unsteadiness and exacerbating dizziness.  Migraine with visual symptoms Migraine with visual symptoms managed with atenolol . Increased atenolol  dosage to 100 mg has reduced visual symptoms but may contribute to dizziness. Benefits of migraine control with atenolol  outweigh potential increase in dizziness. - Continue atenolol  100 mg daily - Monitor for dizziness and adjust atenolol  dosage if necessary     1. DISH (diffuse idiopathic skeletal hyperostosis)   2. Cervical stenosis of spine   3. Cervicogenic dizziness   4. Neuropathy   5. Abnormal gait   6. At risk for falls      Patient Instructions  Refer to physical therapy for gait training and to assess if a device is needed Increase your fluid intake, at least 48 ounces of water daily Continue to follow PCP Return as needed    Orders Placed This Encounter  Procedures   Ambulatory referral to Physical Therapy    No orders of the defined types were placed in this encounter.   Return if symptoms worsen or fail to improve.  I have spent a total of 55 minutes dedicated to this patient today, preparing to see patient, performing a medically appropriate examination and evaluation, ordering tests and/or medications and procedures, and counseling and educating the patient/family/caregiver; independently interpreting result and communicating results to the family/patient/caregiver; and documenting clinical information in the electronic medical record.   Pastor Falling, MD 03/27/2024, 3:36 PM  [facility] [address] [place],  [place] [address] [phone]

## 2024-03-27 NOTE — Telephone Encounter (Signed)
 Referral for physical therapy fax [facility] [facility] [place]. Phone: [phone], Fax: [phone]

## 2024-03-27 NOTE — Patient Instructions (Signed)
 Refer to physical therapy for gait training and to assess if a device is needed Increase your fluid intake, at least 48 ounces of water daily Continue to follow PCP Return as needed

## 2024-03-29 ENCOUNTER — Other Ambulatory Visit: Payer: Self-pay | Admitting: Internal Medicine

## 2024-03-29 DIAGNOSIS — E1122 Type 2 diabetes mellitus with diabetic chronic kidney disease: Secondary | ICD-10-CM

## 2024-04-10 DIAGNOSIS — Z23 Encounter for immunization: Secondary | ICD-10-CM | POA: Diagnosis not present

## 2024-04-18 DIAGNOSIS — M6281 Muscle weakness (generalized): Secondary | ICD-10-CM | POA: Diagnosis not present

## 2024-04-18 DIAGNOSIS — R269 Unspecified abnormalities of gait and mobility: Secondary | ICD-10-CM | POA: Diagnosis not present

## 2024-04-18 DIAGNOSIS — R42 Dizziness and giddiness: Secondary | ICD-10-CM | POA: Diagnosis not present

## 2024-04-18 DIAGNOSIS — Z9181 History of falling: Secondary | ICD-10-CM | POA: Diagnosis not present

## 2024-04-25 DIAGNOSIS — M6281 Muscle weakness (generalized): Secondary | ICD-10-CM | POA: Diagnosis not present

## 2024-04-25 DIAGNOSIS — R42 Dizziness and giddiness: Secondary | ICD-10-CM | POA: Diagnosis not present

## 2024-04-25 DIAGNOSIS — Z9181 History of falling: Secondary | ICD-10-CM | POA: Diagnosis not present

## 2024-04-25 DIAGNOSIS — R269 Unspecified abnormalities of gait and mobility: Secondary | ICD-10-CM | POA: Diagnosis not present

## 2024-05-02 DIAGNOSIS — M6281 Muscle weakness (generalized): Secondary | ICD-10-CM | POA: Diagnosis not present

## 2024-05-02 DIAGNOSIS — Z9181 History of falling: Secondary | ICD-10-CM | POA: Diagnosis not present

## 2024-05-02 DIAGNOSIS — R42 Dizziness and giddiness: Secondary | ICD-10-CM | POA: Diagnosis not present

## 2024-05-02 DIAGNOSIS — R269 Unspecified abnormalities of gait and mobility: Secondary | ICD-10-CM | POA: Diagnosis not present

## 2024-05-02 DIAGNOSIS — Z23 Encounter for immunization: Secondary | ICD-10-CM | POA: Diagnosis not present

## 2024-05-06 ENCOUNTER — Ambulatory Visit (INDEPENDENT_AMBULATORY_CARE_PROVIDER_SITE_OTHER)

## 2024-05-06 VITALS — BP 120/64 | HR 69 | Temp 97.3°F | Ht 70.5 in | Wt 170.4 lb

## 2024-05-06 DIAGNOSIS — Z0000 Encounter for general adult medical examination without abnormal findings: Secondary | ICD-10-CM | POA: Diagnosis not present

## 2024-05-06 NOTE — Progress Notes (Cosign Needed Addendum)
 Subjective:   Joshua Higgins is a 78 y.o. male who presents for a Medicare Annual Wellness Visit.  Allergies (verified) Gemfibrozil , Ace inhibitors, and Lipitor [atorvastatin]   History: Past Medical History:  Diagnosis Date   Allergies    Anemia    low iron   Arthritis    Aspiration into airway 09/20/2023   Aspiration into airway (J69.0) #Resolved #StudyNegative   STATUS: 11/29/2023: Modified barium swallow study definitively showed NO ASPIRATION during swallowing evaluation. Patient demonstrates vestibular penetration only, which does not constitute true aspiration into the airway. Previous clinical suspicion of aspiration was based on symptoms but not confirmed on objective testing.   CLINICAL CONTE   Cancer (HCC)    skin cancer on face   Cataracts, bilateral    Coronary artery disease    CABG - 2008   COVID-19 (01/29/2021) 02/02/2021   Diabetes mellitus without complication (HCC)    type II   GERD (gastroesophageal reflux disease)    Hepatitis    History of sleep apnea    Hyperlipidemia    Hypertension    pt states he does not have HTN, takes Atenolol  for migraines   Hypothyroidism    Migraine headache with aura    takes Atenolol    Prediabetes 03/12/2023   Sleep apnea    does not use cpap   TIA (transient ischemic attack) 07/2022   Past Surgical History:  Procedure Laterality Date   BACK SURGERY     CARDIAC SURGERY     Bypass   CATARACT EXTRACTION, BILATERAL Bilateral 2018   Dr. Roz   COLONOSCOPY     CORONARY ANGIOPLASTY  2001   CORONARY ARTERY BYPASS GRAFT     ELBOW SURGERY Left    due to damaged nerve   KNEE SURGERY Right    arthroscopic   LUMBAR LAMINECTOMY/DECOMPRESSION MICRODISCECTOMY Right 08/04/2014   Procedure: Right Lumbar four-five Microdiskectomy;  Surgeon: Victory Gens, MD;  Location: [facility];  Service: Neurosurgery;  Laterality: Right;  Right L4-5 Microdiskectomy   NECK SURGERY  07/2023   POSTERIOR CERVICAL LAMINECTOMY N/A  08/03/2023   Procedure: Posterior Cervical Laminectomy and Decompresson Cervical One-Cervical Two;  Surgeon: Gens Victory, MD;  Location: [facility];  Service: Neurosurgery;  Laterality: N/A;  C3   SURGERY SCROTAL / TESTICULAR     as a child/teenager   Family History  Problem Relation Age of Onset   Ulcers Mother    Arthritis Mother    Heart disease Father    Heart attack Father    Heart disease Sister    Heart disease Sister    CAD Sister    Heart disease Brother    Heart disease Brother    Heart attack Brother    CAD Brother    Hypertension Brother    Heart disease Brother    Heart disease Brother    Diabetes Maternal Grandmother    Diabetes Paternal Grandmother    Social History   Occupational History   Not on file  Tobacco Use   Smoking status: Former    Current packs/day: 0.00    Types: Cigarettes    Quit date: 10/01/1973    Years since quitting: 50.6   Smokeless tobacco: Never  Vaping Use   Vaping status: Never Used  Substance and Sexual Activity   Alcohol use: No   Drug use: No   Sexual activity: Not Currently   Tobacco Counseling Counseling given: Not Answered  SDOH Screenings   Food Insecurity: No Food Insecurity (05/06/2024)  Housing: Unknown (05/06/2024)  Transportation Needs: No Transportation Needs (05/06/2024)  Utilities: Not At Risk (05/06/2024)  Depression (PHQ2-9): Low Risk  (05/06/2024)  Physical Activity: Sufficiently Active (05/06/2024)  Social Connections: Moderately Integrated (05/06/2024)  Stress: No Stress Concern Present (05/06/2024)  Tobacco Use: Medium Risk (03/27/2024)  Health Literacy: Adequate Health Literacy (05/06/2024)   Depression Screen    05/06/2024    8:15 AM 05/08/2023    9:53 PM 05/08/2023    9:47 PM 01/18/2023   11:09 AM 10/06/2022    9:51 PM 02/25/2022   10:05 PM 12/03/2021    9:46 AM  PHQ 2/9 Scores  PHQ - 2 Score 0 0 0 0 0 0 0  Exception Documentation Other- indicate reason in comment box           Goals Addressed                This Visit's Progress     Patient Stated (pt-stated)        Maintain health and activity        Visit info / Clinical Intake: Medicare Wellness Visit Type:: Subsequent Annual Wellness Visit Medicare Wellness Visit Mode:: In-person (required for WTM) Interpreter Needed?: No Pre-visit prep was completed: yes AWV questionnaire completed by patient prior to visit?: no Living arrangements:: lives with spouse/significant other Patient's Overall Health Status Rating: very good Typical amount of pain: none Does pain affect daily life?: no Are you currently prescribed opioids?: no  Dietary Habits and Nutritional Risks How many meals a day?: (!) 1 (pt stated big meals and nibble throughthe day) Eats fruit and vegetables daily?: yes Most meals are obtained by: eating out Diabetic:: (!) yes Any non-healing wounds?: no How often do you check your BS?: as needed Would you like to be referred to a Nutritionist or for Diabetic Management? : no  Functional Status Activities of Daily Living (to include ambulation/medication): Independent Ambulation: Independent Medication Administration: Independent Home Management: Independent Manage your own finances?: yes Primary transportation is: driving Concerns about vision?: (!) yes (following up with eye Dr, he has lens in eye) Concerns about hearing?: no  Fall Screening Falls in the past year?: 1 Number of falls in past year: 1 Was there an injury with Fall?: 1 Fall Risk Category Calculator: 3 Patient Fall Risk Level: High Fall Risk  Fall Risk Patient at Risk for Falls Due to: History of fall(s); Impaired balance/gait; Impaired mobility Fall risk Follow up: Falls prevention discussed  Home and Transportation Safety: All rugs have non-skid backing?: yes All stairs or steps have railings?: yes Grab bars in the bathtub or shower?: yes Have non-skid surface in bathtub or shower?: yes Good home lighting?: yes Regular seat belt use?:  yes Hospital stays in the last year:: (!) yes How many hospital stays:: 1 Reason: mild stroke TIA  Cognitive Assessment Difficulty concentrating, remembering, or making decisions? : yes Will 6CIT or Mini Cog be Completed: yes What year is it?: 0 points What month is it?: 0 points Give patient an address phrase to remember (5 components): [address] [place]  About what time is it?: 0 points Count backwards from 20 to 1: 0 points Say the months of the year in reverse: 0 points Repeat the address phrase from earlier: 2 points 6 CIT Score: 2 points  Advance Directives (For Healthcare) Does Patient Have a Medical Advance Directive?: Yes Does patient want to make changes to medical advance directive?: No - Patient declined Type of Advance Directive: Healthcare Power of [address]  Copy of Healthcare Power of Attorney in Chart?: Yes - validated most recent copy scanned in chart (See row information)  Reviewed/Updated  Reviewed/Updated: All        Objective:    Today's Vitals   05/06/24 0757  BP: 120/64  Pulse: 69  Temp: (!) 97.3 F (36.3 C)  SpO2: 97%  Weight: 170 lb 6.4 oz (77.3 kg)  Height: 5' 10.5 (1.791 m)   Body mass index is 24.1 kg/m.  Current Medications (verified) Outpatient Encounter Medications as of 05/06/2024  Medication Sig   atenolol  (TENORMIN ) 100 MG tablet Take 1 tablet (100 mg total) by mouth daily.   Blood Glucose Monitoring Suppl DEVI Use kit to check blood sugar once a day   Cholecalciferol  125 MCG (5000 UT) TABS Take 1 tablet (5,000 Units total) by mouth at bedtime.   clopidogrel  (PLAVIX ) 75 MG tablet TAKE 1 TABLET BY MOUTH EVERY DAY   CONTOUR NEXT TEST test strip USE TO TEST BLOOD SUGAR ONCE DAILY   ezetimibe  (ZETIA ) 10 MG tablet Take 1 tablet (10 mg total) by mouth daily.   famotidine  (PEPCID ) 20 MG tablet Take 20 mg by mouth as needed for heartburn or indigestion.   levothyroxine  (SYNTHROID ) 50 MCG tablet TAKE 1 TABLET BY MOUTH DAILY ON AN EMPTY  STOMACH WITH ONLY WATER FOR 30 MINUTES & NO ANTACID MEDS, CALCIUM  OR MAGNESIUM FOR 4 HOURS & AVOID BIOTIN   metFORMIN  (GLUCOPHAGE -XR) 500 MG 24 hr tablet TAKE 2 TABLETS BY MOUTH 2 TIMES PER DAY WITH MEALS FOR DIABETES   Microlet Lancets MISC USE TO TEST BLOOD SUGAR ONCE DAILY   Multiple Vitamin (MULTIVITAMIN) tablet Take 1 tablet by mouth at bedtime.    Omega-3 Fatty Acids (FISH OIL ) 1000 MG CAPS Take 1 capsule (1,000 mg total) by mouth at bedtime.   rosuvastatin  (CRESTOR ) 10 MG tablet Take 1 tablet (10 mg total) by mouth See admin instructions. Take one tablet by mouth twice a week Sundays and Wednesdays   Semaglutide ,0.25 or 0.5MG /DOS, (OZEMPIC , 0.25 OR 0.5 MG/DOSE,) 2 MG/3ML SOPN Inject 0.25 mg into the skin once a week.   [DISCONTINUED] Cyanocobalamin  (B-12) 5000 MCG SUBL Place 1 tablet under the tongue daily.   No facility-administered encounter medications on file as of 05/06/2024.   Hearing/Vision screen Hearing Screening - Comments:: Pt denies any hearing issues  Vision Screening - Comments:: Wears rx glasses - up to date with routine eye exams with Dr Lauraine Fass  Immunizations and Health Maintenance Health Maintenance  Topic Date Due   COVID-19 Vaccine (7 - 2025-26 season) 03/04/2024   FOOT EXAM  05/06/2024   OPHTHALMOLOGY EXAM  05/14/2024   HEMOGLOBIN A1C  08/16/2024   Diabetic kidney evaluation - Urine ACR  02/13/2025   Diabetic kidney evaluation - eGFR measurement  03/13/2025   Medicare Annual Wellness (AWV)  05/06/2025   DTaP/Tdap/Td (3 - Tdap) 06/03/2025   Pneumococcal Vaccine: 50+ Years  Completed   Influenza Vaccine  Completed   Zoster Vaccines- Shingrix  Completed   Meningococcal B Vaccine  Aged Out   Colonoscopy  Discontinued   Hepatitis C Screening  Discontinued   Fecal DNA (Cologuard)  Discontinued        Assessment/Plan:  This is a routine wellness examination for Joshua Higgins.  Patient Care Team: Jesus Bernardino MATSU, MD as PCP - General (Internal  Medicine) Francyne Headland, MD as PCP - Cardiology (Cardiology) Roz Anes, MD as Consulting Physician (Ophthalmology) Colon Shove, MD as Consulting Physician (Neurosurgery) Dyane Rush, MD (Inactive) as  Consulting Physician (Gastroenterology)  I have personally reviewed and noted the following in the patient's chart:   Medical and social history Use of alcohol, tobacco or illicit drugs  Current medications and supplements including opioid prescriptions. Functional ability and status Nutritional status Physical activity Advanced directives List of other physicians Hospitalizations, surgeries, and ER visits in previous 12 months Vitals Screenings to include cognitive, depression, and falls Referrals and appointments  No orders of the defined types were placed in this encounter.  In addition, I have reviewed and discussed with patient certain preventive protocols, quality metrics, and best practice recommendations. A written personalized care plan for preventive services as well as general preventive health recommendations were provided to patient.   Ellouise VEAR Haws, LPN   88/12/7972   Return in 1 year (on 05/06/2025).  After Visit Summary: (In Person-Declined) Patient declined AVS at this time.  Nurse Notes: appt made for next year nothing significant at this time

## 2024-05-06 NOTE — Patient Instructions (Signed)
 Joshua Higgins,  Thank you for taking the time for your Medicare Wellness Visit. I appreciate your continued commitment to your health goals. Please review the care plan we discussed, and feel free to reach out if I can assist you further.  Please note that Annual Wellness Visits do not include a physical exam. Some assessments may be limited, especially if the visit was conducted virtually. If needed, we may recommend an in-person follow-up with your provider.  Ongoing Care Seeing your primary care provider every 3 to 6 months helps us  monitor your health and provide consistent, personalized care.   Referrals If a referral was made during today's visit and you haven't received any updates within two weeks, please contact the referred provider directly to check on the status.  Recommended Screenings:  Health Maintenance  Topic Date Due   Medicare Annual Wellness Visit  01/18/2024   COVID-19 Vaccine (7 - 2025-26 season) 03/04/2024   Complete foot exam   05/06/2024   Eye exam for diabetics  05/14/2024   Hemoglobin A1C  08/16/2024   Yearly kidney health urinalysis for diabetes  02/13/2025   Yearly kidney function blood test for diabetes  03/13/2025   DTaP/Tdap/Td vaccine (3 - Tdap) 06/03/2025   Pneumococcal Vaccine for age over 15  Completed   Flu Shot  Completed   Zoster (Shingles) Vaccine  Completed   Meningitis B Vaccine  Aged Out   Colon Cancer Screening  Discontinued   Hepatitis C Screening  Discontinued   Cologuard (Stool DNA test)  Discontinued       03/13/2024    3:08 PM  Advanced Directives  Does Patient Have a Medical Advance Directive? No    Vision: Annual vision screenings are recommended for early detection of glaucoma, cataracts, and diabetic retinopathy. These exams can also reveal signs of chronic conditions such as diabetes and high blood pressure.  Dental: Annual dental screenings help detect early signs of oral cancer, gum disease, and other conditions linked to  overall health, including heart disease and diabetes.  Please see the attached documents for additional preventive care recommendations.

## 2024-05-08 ENCOUNTER — Ambulatory Visit

## 2024-05-09 DIAGNOSIS — Z9181 History of falling: Secondary | ICD-10-CM | POA: Diagnosis not present

## 2024-05-09 DIAGNOSIS — M6281 Muscle weakness (generalized): Secondary | ICD-10-CM | POA: Diagnosis not present

## 2024-05-09 DIAGNOSIS — R42 Dizziness and giddiness: Secondary | ICD-10-CM | POA: Diagnosis not present

## 2024-05-09 DIAGNOSIS — R269 Unspecified abnormalities of gait and mobility: Secondary | ICD-10-CM | POA: Diagnosis not present

## 2024-05-15 ENCOUNTER — Encounter: Payer: Medicare Other | Admitting: Internal Medicine

## 2024-05-15 DIAGNOSIS — H40013 Open angle with borderline findings, low risk, bilateral: Secondary | ICD-10-CM | POA: Diagnosis not present

## 2024-05-15 DIAGNOSIS — E119 Type 2 diabetes mellitus without complications: Secondary | ICD-10-CM | POA: Diagnosis not present

## 2024-05-15 DIAGNOSIS — H5053 Vertical heterophoria: Secondary | ICD-10-CM | POA: Diagnosis not present

## 2024-05-15 LAB — OPHTHALMOLOGY REPORT-SCANNED

## 2024-05-16 ENCOUNTER — Ambulatory Visit: Admitting: Internal Medicine

## 2024-05-16 DIAGNOSIS — M6281 Muscle weakness (generalized): Secondary | ICD-10-CM | POA: Diagnosis not present

## 2024-05-16 DIAGNOSIS — R42 Dizziness and giddiness: Secondary | ICD-10-CM | POA: Diagnosis not present

## 2024-05-16 DIAGNOSIS — R269 Unspecified abnormalities of gait and mobility: Secondary | ICD-10-CM | POA: Diagnosis not present

## 2024-05-16 DIAGNOSIS — Z9181 History of falling: Secondary | ICD-10-CM | POA: Diagnosis not present

## 2024-05-23 DIAGNOSIS — R269 Unspecified abnormalities of gait and mobility: Secondary | ICD-10-CM | POA: Diagnosis not present

## 2024-05-23 DIAGNOSIS — Z9181 History of falling: Secondary | ICD-10-CM | POA: Diagnosis not present

## 2024-05-23 DIAGNOSIS — M6281 Muscle weakness (generalized): Secondary | ICD-10-CM | POA: Diagnosis not present

## 2024-05-23 DIAGNOSIS — R42 Dizziness and giddiness: Secondary | ICD-10-CM | POA: Diagnosis not present

## 2024-05-29 ENCOUNTER — Encounter: Payer: Self-pay | Admitting: Internal Medicine

## 2024-05-29 ENCOUNTER — Ambulatory Visit (INDEPENDENT_AMBULATORY_CARE_PROVIDER_SITE_OTHER): Admitting: Internal Medicine

## 2024-05-29 VITALS — BP 120/66 | HR 72 | Temp 98.0°F | Ht 70.5 in | Wt 168.0 lb

## 2024-05-29 DIAGNOSIS — R42 Dizziness and giddiness: Secondary | ICD-10-CM | POA: Diagnosis not present

## 2024-05-29 DIAGNOSIS — G959 Disease of spinal cord, unspecified: Secondary | ICD-10-CM

## 2024-05-29 DIAGNOSIS — H518 Other specified disorders of binocular movement: Secondary | ICD-10-CM

## 2024-05-29 DIAGNOSIS — H5500 Unspecified nystagmus: Secondary | ICD-10-CM | POA: Diagnosis not present

## 2024-05-29 DIAGNOSIS — Z7985 Long-term (current) use of injectable non-insulin antidiabetic drugs: Secondary | ICD-10-CM

## 2024-05-29 DIAGNOSIS — K5909 Other constipation: Secondary | ICD-10-CM | POA: Diagnosis not present

## 2024-05-29 DIAGNOSIS — I10 Essential (primary) hypertension: Secondary | ICD-10-CM | POA: Diagnosis not present

## 2024-05-29 DIAGNOSIS — E119 Type 2 diabetes mellitus without complications: Secondary | ICD-10-CM | POA: Diagnosis not present

## 2024-05-29 MED ORDER — ATENOLOL 50 MG PO TABS: 50.0000 mg | ORAL_TABLET | Freq: Two times a day (BID) | ORAL | 4 refills | Status: AC

## 2024-05-29 MED ORDER — OZEMPIC (0.25 OR 0.5 MG/DOSE) 2 MG/3ML ~~LOC~~ SOPN
0.5000 mL | PEN_INJECTOR | SUBCUTANEOUS | 5 refills | Status: AC
Start: 2024-05-29 — End: ?

## 2024-05-29 NOTE — Patient Instructions (Signed)
 A patient handout will be written to explain apraxia, focusing on oculomotor apraxia and its symptoms, including those triggered by moving from lying to sitting. The evidence highlights the clinical features, possible causes, and the importance of further evaluation, especially in the context of neuro-ophthalmology referral.[1][2][3][4] Understanding Apraxia  Apraxia is a condition that makes it hard for a person to carry out certain movements, even though their muscles and senses are working normally. There are different types of apraxia, and one type affects how the eyes move. This is called oculomotor apraxia.  What is oculomotor apraxia?  Oculomotor apraxia means having trouble starting quick eye movements, especially when trying to look from one thing to another. Sometimes, the eyes may seem to lock up or not move together smoothly. This can make it hard to follow objects or quickly shift your gaze. Some people notice these symptoms more when they change positions, like moving from lying down to sitting up.[1][2][3]  Why does it happen?  Oculomotor apraxia can be present from birth or develop later. It may be linked to changes in certain parts of the brain, especially the cerebellum, which helps control movement and balance.[1][3][4] Sometimes, it is part of a genetic condition or another neurological disorder. In other cases, it may happen on its own.  What symptoms might you notice?  - Difficulty moving your eyes quickly from one spot to another  - Eyes that seem to dance around or move out of sync  - Trouble following moving objects  - Feeling dizzy, especially when changing positions  - Sometimes, focusing on something (like writing) can help calm the eye movements[2][3]  Is it serious?  Oculomotor apraxia can affect daily life, especially reading, moving around, or playing sports. It may also be linked to other problems, like trouble with balance or coordination. Even if  symptoms seem mild, it is important to have them checked, as they can sometimes be a sign of a bigger issue.[1][3][4]  What happens next?  A neuro-ophthalmologist is a doctor who specializes in eye movements and how they relate to the brain. They will do tests to learn more about your symptoms and may suggest imaging or other studies to look for causes.[1][2][3] Sometimes, therapy or special strategies can help with symptoms.[5]  What can you do?  - Write down when symptoms happen and what makes them better or worse  - Tell your doctor about any other problems, like trouble walking or speaking  - Follow up with specialists as recommended  Remember, you are not alone. Many people with apraxia find ways to manage their symptoms and live full lives. Your upcoming appointment will help you and your care team understand your condition and plan the best next steps.   This patient handout provides a clear, evidence-based overview of apraxia and oculomotor apraxia, tailored for patients experiencing symptoms triggered by position changes. If more specific details or resources are needed for the patient, these can be added before distribution.   Would you like me to review the literature on differential diagnoses for oculomotor apraxia that are specifically provoked by postural changes, such as vestibular or cerebellar disorders, to help guide further diagnostic workup before the neuro-ophthalmology visit? References Approach to Oculomotor Apraxia: A Syndromic Approach to Genetic Causes. Salari M, Rezaei K, Haghighatzadeh M, Mirabedini M, Etemadifar M. Cerebellum [place], [place]). 2025;24(4):116. doi:10.1007/[id number]. The Role of ERG/VEP and Eye Movement Recordings in Children With Ocular Motor Apraxia. Shawkat FS, Harris CM, [employer], Kriss A. Eye [place], [place]). 1996;10 (  Pt 1):53-60. doi:10.1038/eye.1996.8. Neurodevelopmental Implications of Ocular Motor Apraxia. Forrestine JE, Green SH,  Willshaw HE. Developmental Medicine and Child Neurology. 2005;47(12):815-9. doi:10.1017/[id number]. Speech, Cognition, and Imaging Studies in Congenital Ocular Motor Apraxia. Madison LONI Elayne GORMAN Christel CHRISTELLA Isadora MA, Poskitt KJ. Developmental Medicine and Child Neurology. 1998;40(2):95-9. Treatments and Technologies in the Rehabilitation of Apraxia and Action Disorganisation Syndrome: A Review. Worthington A. NeuroRehabilitation. 2016;39(1):163-74. doi:10.3233/NRE-161348.

## 2024-05-29 NOTE — Progress Notes (Signed)
 ==============================  [facility]: [phone]   -- Medical Office Visit --  Patient: Joshua Higgins      Age: 78 y.o.       Sex:  male  Date:   05/29/2024 Today's Healthcare Provider: Bernardino KANDICE Cone, MD  ==============================   Chief Complaint: Diabetes Dizziness   Discussed the use of AI scribe software for clinical note transcription with the patient, who gave verbal consent to proceed.  History of Present Illness 78 year old male with dizziness and ocular motor apraxia syndrome who presents for follow-up on his condition.  He has been experiencing dizziness for an extended period, often triggered when moving from a lying to a sitting position. Despite consultations with multiple specialists, including a neurologist and an ENT, no definitive cause has been identified. Recently, a physical therapist noted abnormal eye movements. This eye movement is associated with his dizziness, and he is seeking further evaluation from a neuro-ophthalmologist.  He manages his blood pressure with 50 mg of medication taken twice daily, maintaining it at 120/66 mmHg. Previously, he experienced side effects with a higher dose, which led to the adjustment. He continues to monitor his blood pressure and adjust his medication as needed.  He is currently taking several medications, including Zetia , Crestor , and Ozempic . He administers Ozempic  weekly without adverse effects. He also takes metformin .  His past medical history includes a stiff neck due to vertebral fusion, contributing to his dizziness. Falling is a significant concern for him. No recent falls reported. Reports dizziness upon standing from a lying position. No recent changes in physical condition since last visit.  Lab Results  Component Value Date   HGBA1C 6.7 (H) 02/14/2024   HGBA1C 6.2 (H) 07/27/2023   HGBA1C 6.4 (H) 05/08/2023    Background Reviewed: Problem List: has Type  II diabetes mellitus with renal manifestations (HCC); Hyperlipidemia associated with type 2 diabetes mellitus (HCC); Hypertension; GERD (gastroesophageal reflux disease); IBS (irritable bowel syndrome); Vitamin D  deficiency; CAD (coronary artery disease) of artery bypass graft; Hypothyroidism; Atherosclerosis of abdominal aorta (HCC) by CXR in 2017 ; CKD stage 2 due to type 2 diabetes mellitus (HCC); Former smoker; History of Mohs micrographic surgery for skin cancer; Macrocytic anemia; B12 deficiency; History of TIA (transient ischemic attack); Cervical myelopathy (HCC); Carotid atherosclerosis; Emphysema lung (HCC); Vertigo; Migraine with aura and with status migrainosus, not intractable; Dysphagia causing pulmonary aspiration with swallowing; Peripheral arterial disease; Sleep apnea; HNP (herniated nucleus pulposus), cervical; Lumbosacral radiculopathy; Neck pain; Chronic cerebral ischemia; DISH (diffuse idiopathic skeletal hyperostosis); At maximum risk for fall; Sensory ataxia; Numbness and tingling in left arm; Dizziness - Chronic, Persistent, Idiopathic, associated with severe falls and imbalance; Ocular motor apraxia syndrome; and Nystagmus of right eye on their problem list. Past Medical History:  has a past medical history of Allergies, Anemia, Arthritis, Aspiration into airway (09/20/2023), Cancer (HCC), Cataracts, bilateral, Coronary artery disease, COVID-19 (01/29/2021) (02/02/2021), Diabetes mellitus without complication (HCC), GERD (gastroesophageal reflux disease), Hepatitis, History of sleep apnea, Hyperlipidemia, Hypertension, Hypothyroidism, Migraine headache with aura, Prediabetes (03/12/2023), Sleep apnea, and TIA (transient ischemic attack) (07/2022). Past Surgical History:   has a past surgical history that includes Cardiac surgery; Knee surgery (Right); Back surgery; Coronary angioplasty (2001); Coronary artery bypass graft; Surgery scrotal / testicular; Elbow surgery (Left); Colonoscopy;  Lumbar laminectomy/decompression microdiscectomy (Right, 08/04/2014); Cataract extraction, bilateral (Bilateral, 2018); Posterior cervical laminectomy (N/A, 08/03/2023); and Neck surgery (07/2023). Social History:   reports that he quit smoking about 50 years  ago. His smoking use included cigarettes. He has never used smokeless tobacco. He reports that he does not drink alcohol and does not use drugs. Family History:  family history includes Arthritis in his mother; CAD in his brother and sister; Diabetes in his maternal grandmother and paternal grandmother; Heart attack in his brother and father; Heart disease in his brother, brother, brother, brother, father, sister, and sister; Hypertension in his brother; Ulcers in his mother. Allergies:  is allergic to gemfibrozil , ace inhibitors, and lipitor [atorvastatin].   Medication Reconciliation: Current Outpatient Medications on File Prior to Visit  Medication Sig   Blood Glucose Monitoring Suppl DEVI Use kit to check blood sugar once a day   Cholecalciferol  125 MCG (5000 UT) TABS Take 1 tablet (5,000 Units total) by mouth at bedtime.   clopidogrel  (PLAVIX ) 75 MG tablet TAKE 1 TABLET BY MOUTH EVERY DAY   CONTOUR NEXT TEST test strip USE TO TEST BLOOD SUGAR ONCE DAILY   ezetimibe  (ZETIA ) 10 MG tablet Take 1 tablet (10 mg total) by mouth daily.   famotidine  (PEPCID ) 20 MG tablet Take 20 mg by mouth as needed for heartburn or indigestion.   levothyroxine  (SYNTHROID ) 50 MCG tablet TAKE 1 TABLET BY MOUTH DAILY ON AN EMPTY STOMACH WITH ONLY WATER FOR 30 MINUTES & NO ANTACID MEDS, CALCIUM  OR MAGNESIUM FOR 4 HOURS & AVOID BIOTIN   metFORMIN  (GLUCOPHAGE -XR) 500 MG 24 hr tablet TAKE 2 TABLETS BY MOUTH 2 TIMES PER DAY WITH MEALS FOR DIABETES   Microlet Lancets MISC USE TO TEST BLOOD SUGAR ONCE DAILY   Multiple Vitamin (MULTIVITAMIN) tablet Take 1 tablet by mouth at bedtime.    Omega-3 Fatty Acids (FISH OIL ) 1000 MG CAPS Take 1 capsule (1,000 mg total) by mouth at  bedtime.   rosuvastatin  (CRESTOR ) 10 MG tablet Take 1 tablet (10 mg total) by mouth See admin instructions. Take one tablet by mouth twice a week Sundays and Wednesdays   No current facility-administered medications on file prior to visit.   Medications Discontinued During This Encounter  Medication Reason   atenolol  (TENORMIN ) 100 MG tablet    Semaglutide ,0.25 or 0.5MG /DOS, (OZEMPIC , 0.25 OR 0.5 MG/DOSE,) 2 MG/3ML SOPN     Physical Exam:    05/29/2024    9:51 AM 05/06/2024    7:57 AM 03/27/2024    1:59 PM  Vitals with BMI  Height 5' 10.5 5' 10.5 5' 10  Weight 168 lbs 170 lbs 6 oz 172 lbs  BMI 23.76 24.1 24.68  Systolic 120 120 852  Diastolic 66 64 67  Pulse 72 69 70  Vital signs reviewed.  Nursing notes reviewed. Weight trend reviewed. Physical Activity: Sufficiently Active (05/06/2024)   Exercise Vital Sign    Days of Exercise per Week: 5 days    Minutes of Exercise per Session: 60 min   General Appearance:  No acute distress appreciable.   Well-groomed, healthy-appearing male.  Well proportioned with no abnormal fat distribution.  Good muscle tone. Pulmonary:  Normal work of breathing at rest, no respiratory distress apparent. SpO2: 98 %  Musculoskeletal: All extremities are intact.  Neurological:  Awake, alert, oriented, and engaged.  No obvious focal neurological deficits or cognitive impairments.  Sensorium seems unclouded.   Speech is clear and coherent with logical content. Psychiatric:  Appropriate mood, pleasant and cooperative demeanor, thoughtful and engaged during the exam   Verbalized to patient: Physical Exam VITALS: BP- 120/66 MEASUREMENTS: Weight- 168.  Results DIAGNOSTIC Ocular motor assessment: Ocular motor apraxia syndrome was  not reproducible during today's visit- eomi.     05/06/2024    8:15 AM 05/08/2023    9:53 PM 05/08/2023    9:47 PM 01/18/2023   11:09 AM  PHQ 2/9 Scores  PHQ - 2 Score 0 0 0 0  Exception Documentation Other- indicate reason  in comment box       Scanned Document on 05/15/2024  Component Date Value Ref Range Status   HM Diabetic Eye Exam 05/15/2024 No Retinopathy  No Retinopathy Final  Admission on 03/13/2024, Discharged on 03/13/2024  Component Date Value Ref Range Status   Glucose-Capillary 03/13/2024 102 (H)  70 - 99 mg/dL Final   WBC 90/89/7974 5.1  4.0 - 10.5 K/uL Final   RBC 03/13/2024 3.73 (L)  4.22 - 5.81 MIL/uL Final   Hemoglobin 03/13/2024 12.7 (L)  13.0 - 17.0 g/dL Final   HCT 90/89/7974 38.1 (L)  39.0 - 52.0 % Final   MCV 03/13/2024 102.1 (H)  80.0 - 100.0 fL Final   MCH 03/13/2024 34.0  26.0 - 34.0 pg Final   MCHC 03/13/2024 33.3  30.0 - 36.0 g/dL Final   RDW 90/89/7974 12.8  11.5 - 15.5 % Final   Platelets 03/13/2024 152  150 - 400 K/uL Final   nRBC 03/13/2024 0.0  0.0 - 0.2 % Final   Neutrophils Relative % 03/13/2024 48  % Final   Neutro Abs 03/13/2024 2.4  1.7 - 7.7 K/uL Final   Lymphocytes Relative 03/13/2024 39  % Final   Lymphs Abs 03/13/2024 2.0  0.7 - 4.0 K/uL Final   Monocytes Relative 03/13/2024 9  % Final   Monocytes Absolute 03/13/2024 0.5  0.1 - 1.0 K/uL Final   Eosinophils Relative 03/13/2024 4  % Final   Eosinophils Absolute 03/13/2024 0.2  0.0 - 0.5 K/uL Final   Basophils Relative 03/13/2024 0  % Final   Basophils Absolute 03/13/2024 0.0  0.0 - 0.1 K/uL Final   Immature Granulocytes 03/13/2024 0  % Final   Abs Immature Granulocytes 03/13/2024 0.01  0.00 - 0.07 K/uL Final   Sodium 03/13/2024 140  135 - 145 mmol/L Final   Potassium 03/13/2024 4.8  3.5 - 5.1 mmol/L Final   Chloride 03/13/2024 103  98 - 111 mmol/L Final   CO2 03/13/2024 24  22 - 32 mmol/L Final   Glucose, Bld 03/13/2024 114 (H)  70 - 99 mg/dL Final   BUN 90/89/7974 17  8 - 23 mg/dL Final   Creatinine, Ser 03/13/2024 1.12  0.61 - 1.24 mg/dL Final   Calcium  03/13/2024 10.0  8.9 - 10.3 mg/dL Final   GFR, Estimated 03/13/2024 >60  >60 mL/min Final   Anion gap 03/13/2024 13  5 - 15 Final   Color, Urine  03/13/2024 YELLOW  YELLOW Final   APPearance 03/13/2024 CLEAR  CLEAR Final   Specific Gravity, Urine 03/13/2024 1.009  1.005 - 1.030 Final   pH 03/13/2024 6.5  5.0 - 8.0 Final   Glucose, UA 03/13/2024 NEGATIVE  NEGATIVE mg/dL Final   Hgb urine dipstick 03/13/2024 NEGATIVE  NEGATIVE Final   Bilirubin Urine 03/13/2024 NEGATIVE  NEGATIVE Final   Ketones, ur 03/13/2024 NEGATIVE  NEGATIVE mg/dL Final   Protein, ur 90/89/7974 NEGATIVE  NEGATIVE mg/dL Final   Nitrite 90/89/7974 NEGATIVE  NEGATIVE Final   Leukocytes,Ua 03/13/2024 NEGATIVE  NEGATIVE Final   Total Protein 03/13/2024 7.1  6.5 - 8.1 g/dL Final   Albumin 90/89/7974 4.5  3.5 - 5.0 g/dL Final   AST 90/89/7974 23  15 - 41 U/L Final   ALT 03/13/2024 14  0 - 44 U/L Final   Alkaline Phosphatase 03/13/2024 57  38 - 126 U/L Final   Total Bilirubin 03/13/2024 0.5  0.0 - 1.2 mg/dL Final   Bilirubin, Direct 03/13/2024 0.2  0.0 - 0.2 mg/dL Final   Indirect Bilirubin 03/13/2024 0.3  0.3 - 0.9 mg/dL Final  Office Visit on 02/14/2024  Component Date Value Ref Range Status   WBC 02/14/2024 5.3  4.0 - 10.5 K/uL Final   RBC 02/14/2024 3.87 (L)  4.22 - 5.81 Mil/uL Final   Hemoglobin 02/14/2024 13.0  13.0 - 17.0 g/dL Final   HCT 91/86/7974 39.2  39.0 - 52.0 % Final   MCV 02/14/2024 101.3 (H)  78.0 - 100.0 fl Final   MCHC 02/14/2024 33.1  30.0 - 36.0 g/dL Final   RDW 91/86/7974 13.8  11.5 - 15.5 % Final   Platelets 02/14/2024 181.0  150.0 - 400.0 K/uL Final   Neutrophils Relative % 02/14/2024 51.1  43.0 - 77.0 % Final   Lymphocytes Relative 02/14/2024 37.6  12.0 - 46.0 % Final   Monocytes Relative 02/14/2024 8.2  3.0 - 12.0 % Final   Eosinophils Relative 02/14/2024 2.7  0.0 - 5.0 % Final   Basophils Relative 02/14/2024 0.4  0.0 - 3.0 % Final   Neutro Abs 02/14/2024 2.7  1.4 - 7.7 K/uL Final   Lymphs Abs 02/14/2024 2.0  0.7 - 4.0 K/uL Final   Monocytes Absolute 02/14/2024 0.4  0.1 - 1.0 K/uL Final   Eosinophils Absolute 02/14/2024 0.1  0.0 - 0.7  K/uL Final   Basophils Absolute 02/14/2024 0.0  0.0 - 0.1 K/uL Final   Sodium 02/14/2024 141  135 - 145 mEq/L Final   Potassium 02/14/2024 4.3  3.5 - 5.1 mEq/L Final   Chloride 02/14/2024 103  96 - 112 mEq/L Final   CO2 02/14/2024 30  19 - 32 mEq/L Final   Glucose, Bld 02/14/2024 106 (H)  70 - 99 mg/dL Final   BUN 91/86/7974 12  6 - 23 mg/dL Final   Creatinine, Ser 02/14/2024 0.96  0.40 - 1.50 mg/dL Final   Total Bilirubin 02/14/2024 0.7  0.2 - 1.2 mg/dL Final   Alkaline Phosphatase 02/14/2024 43  39 - 117 U/L Final   AST 02/14/2024 17  0 - 37 U/L Final   ALT 02/14/2024 14  0 - 53 U/L Final   Total Protein 02/14/2024 7.3  6.0 - 8.3 g/dL Final   Albumin 91/86/7974 4.5  3.5 - 5.2 g/dL Final   GFR 91/86/7974 74.78  >60.00 mL/min Final   Calcium  02/14/2024 9.5  8.4 - 10.5 mg/dL Final   Cholesterol 91/86/7974 119  0 - 200 mg/dL Final   Triglycerides 91/86/7974 107.0  0.0 - 149.0 mg/dL Final   HDL 91/86/7974 47.80  >39.00 mg/dL Final   VLDL 91/86/7974 21.4  0.0 - 40.0 mg/dL Final   LDL Cholesterol 02/14/2024 50  0 - 99 mg/dL Final   Total CHOL/HDL Ratio 02/14/2024 2   Final   NonHDL 02/14/2024 71.43   Final   Hgb A1c MFr Bld 02/14/2024 6.7 (H)  4.6 - 6.5 % Final   Microalb, Ur 02/14/2024 1.2  0.0 - 1.9 mg/dL Final   Creatinine,U 91/86/7974 57.2  mg/dL Final   Microalb Creat Ratio 02/14/2024 21.7  0.0 - 30.0 mg/g Final   Vitamin B-12 02/14/2024 >1500 (H)  211 - 911 pg/mL Final   Folate 02/14/2024 >23.4  >5.9 ng/mL Final  Methylmalonic Acid, Quant 02/14/2024 138  85 - 423 nmol/L Final   TSH W/REFLEX TO FT4 02/14/2024 1.56  0.40 - 4.50 mIU/L Final  Office Visit on 09/20/2023  Component Date Value Ref Range Status   Sodium 09/20/2023 140  135 - 145 mEq/L Final   Potassium 09/20/2023 4.5  3.5 - 5.1 mEq/L Final   Chloride 09/20/2023 101  96 - 112 mEq/L Final   CO2 09/20/2023 31  19 - 32 mEq/L Final   Glucose, Bld 09/20/2023 92  70 - 99 mg/dL Final   BUN 96/80/7974 13  6 - 23 mg/dL Final    Creatinine, Ser 09/20/2023 1.03  0.40 - 1.50 mg/dL Final   Total Bilirubin 09/20/2023 0.4  0.2 - 1.2 mg/dL Final   Alkaline Phosphatase 09/20/2023 46  39 - 117 U/L Final   AST 09/20/2023 19  0 - 37 U/L Final   ALT 09/20/2023 14  0 - 53 U/L Final   Total Protein 09/20/2023 7.2  6.0 - 8.3 g/dL Final   Albumin 96/80/7974 4.5  3.5 - 5.2 g/dL Final   GFR 96/80/7974 68.91  >60.00 mL/min Final   Calcium  09/20/2023 9.7  8.4 - 10.5 mg/dL Final   TSH W/REFLEX TO FT4 09/20/2023 1.62  0.40 - 4.50 mIU/L Final   Microalb, Ur 09/20/2023 0.7  0.0 - 1.9 mg/dL Final   Creatinine,U 96/80/7974 55.8  mg/dL Final   Microalb Creat Ratio 09/20/2023 Unable to calculate  0.0 - 30.0 mg/g Final   QUESTION/PROBLEM 09/20/2023    Final   UNCLEAR ORDER: 09/20/2023 833 NO LONGER AVAILABLE   Final   SPECIMEN(S) SUBMITTED 09/20/2023 LAV   Final  Admission on 08/03/2023, Discharged on 08/04/2023  Component Date Value Ref Range Status   Glucose-Capillary 08/03/2023 105 (H)  70 - 99 mg/dL Final   Sodium 98/69/7974 140  135 - 145 mmol/L Final   Potassium 08/03/2023 4.2  3.5 - 5.1 mmol/L Final   Chloride 08/03/2023 103  98 - 111 mmol/L Final   BUN 08/03/2023 18  8 - 23 mg/dL Final   Creatinine, Ser 08/03/2023 1.20  0.61 - 1.24 mg/dL Final   Glucose, Bld 98/69/7974 105 (H)  70 - 99 mg/dL Final   Calcium , Ion 08/03/2023 1.17  1.15 - 1.40 mmol/L Final   TCO2 08/03/2023 27  22 - 32 mmol/L Final   Hemoglobin 08/03/2023 12.9 (L)  13.0 - 17.0 g/dL Final   HCT 98/69/7974 38.0 (L)  39.0 - 52.0 % Final   Glucose-Capillary 08/03/2023 141 (H)  70 - 99 mg/dL Final   Glucose-Capillary 08/03/2023 240 (H)  70 - 99 mg/dL Final   Comment 1 98/69/7974 Notify RN   Final   Comment 2 08/03/2023 Document in Chart   Final   Glucose-Capillary 08/04/2023 115 (H)  70 - 99 mg/dL Final   Comment 1 98/68/7974 Notify RN   Final   Comment 2 08/04/2023 Document in Chart   Final  Hospital Outpatient Visit on 07/27/2023  Component Date Value Ref  Range Status   Glucose-Capillary 07/27/2023 135 (H)  70 - 99 mg/dL Final   MRSA, PCR 98/76/7974 NEGATIVE  NEGATIVE Final   Staphylococcus aureus 07/27/2023 NEGATIVE  NEGATIVE Final   Hgb A1c MFr Bld 07/27/2023 6.2 (H)  4.8 - 5.6 % Final   Mean Plasma Glucose 07/27/2023 131.24  mg/dL Final   Sodium 98/76/7974 140  135 - 145 mmol/L Final   Potassium 07/27/2023 5.8 (H)  3.5 - 5.1 mmol/L Final   Chloride 07/27/2023  102  98 - 111 mmol/L Final   CO2 07/27/2023 28  22 - 32 mmol/L Final   Glucose, Bld 07/27/2023 137 (H)  70 - 99 mg/dL Final   BUN 98/76/7974 14  8 - 23 mg/dL Final   Creatinine, Ser 07/27/2023 1.19  0.61 - 1.24 mg/dL Final   Calcium  07/27/2023 10.0  8.9 - 10.3 mg/dL Final   Total Protein 98/76/7974 7.3  6.5 - 8.1 g/dL Final   Albumin 98/76/7974 4.2  3.5 - 5.0 g/dL Final   AST 98/76/7974 21  15 - 41 U/L Final   ALT 07/27/2023 17  0 - 44 U/L Final   Alkaline Phosphatase 07/27/2023 56  38 - 126 U/L Final   Total Bilirubin 07/27/2023 0.9  0.0 - 1.2 mg/dL Final   GFR, Estimated 07/27/2023 >60  >60 mL/min Final   Anion gap 07/27/2023 10  5 - 15 Final   WBC 07/27/2023 6.0  4.0 - 10.5 K/uL Final   RBC 07/27/2023 3.97 (L)  4.22 - 5.81 MIL/uL Final   Hemoglobin 07/27/2023 13.5  13.0 - 17.0 g/dL Final   HCT 98/76/7974 41.5  39.0 - 52.0 % Final   MCV 07/27/2023 104.5 (H)  80.0 - 100.0 fL Final   MCH 07/27/2023 34.0  26.0 - 34.0 pg Final   MCHC 07/27/2023 32.5  30.0 - 36.0 g/dL Final   RDW 98/76/7974 12.4  11.5 - 15.5 % Final   Platelets 07/27/2023 186  150 - 400 K/uL Final   nRBC 07/27/2023 0.0  0.0 - 0.2 % Final  Orders Only on 07/12/2023  Component Date Value Ref Range Status   Fecal Occult Blood, POC 07/12/2023 Negative  Negative Final   Card #2 Fecal Occult Blod, POC 07/12/2023 Negative   Final   Card #3 Fecal Occult Blood, POC 07/12/2023 Negative   Final  Abstract on 05/24/2023  Component Date Value Ref Range Status   HM Diabetic Eye Exam 05/15/2023 No Retinopathy  No  Retinopathy Final  Office Visit on 05/08/2023  Component Date Value Ref Range Status   Color, Urine 05/08/2023 YELLOW  YELLOW Final   APPearance 05/08/2023 CLEAR  CLEAR Final   Specific Gravity, Urine 05/08/2023 1.015  1.001 - 1.035 Final   pH 05/08/2023 6.0  5.0 - 8.0 Final   Glucose, UA 05/08/2023 NEGATIVE  NEGATIVE Final   Bilirubin Urine 05/08/2023 NEGATIVE  NEGATIVE Final   Ketones, ur 05/08/2023 NEGATIVE  NEGATIVE Final   Hgb urine dipstick 05/08/2023 NEGATIVE  NEGATIVE Final   Protein, ur 05/08/2023 NEGATIVE  NEGATIVE Final   Nitrite 05/08/2023 NEGATIVE  NEGATIVE Final   Leukocytes,Ua 05/08/2023 NEGATIVE  NEGATIVE Final   Creatinine, Urine 05/08/2023 95  20 - 320 mg/dL Final   Microalb, Ur 88/95/7975 0.7  mg/dL Final   Microalb Creat Ratio 05/08/2023 7  <30 mg/g creat Final   Vitamin B-12 05/08/2023 1,273 (H)  200 - 1,100 pg/mL Final   PSA 05/08/2023 0.79  < OR = 4.00 ng/mL Final   WBC 05/08/2023 5.5  3.8 - 10.8 Thousand/uL Final   RBC 05/08/2023 3.63 (L)  4.20 - 5.80 Million/uL Final   Hemoglobin 05/08/2023 12.2 (L)  13.2 - 17.1 g/dL Final   HCT 88/95/7975 37.5 (L)  38.5 - 50.0 % Final   MCV 05/08/2023 103.3 (H)  80.0 - 100.0 fL Final   MCH 05/08/2023 33.6 (H)  27.0 - 33.0 pg Final   MCHC 05/08/2023 32.5  32.0 - 36.0 g/dL Final   RDW 88/95/7975 12.0  11.0 - 15.0 % Final   Platelets 05/08/2023 176  140 - 400 Thousand/uL Final   MPV 05/08/2023 9.4  7.5 - 12.5 fL Final   Neutro Abs 05/08/2023 2,855  1,500 - 7,800 cells/uL Final   Absolute Lymphocytes 05/08/2023 1,925  850 - 3,900 cells/uL Final   Absolute Monocytes 05/08/2023 506  200 - 950 cells/uL Final   Eosinophils Absolute 05/08/2023 193  15 - 500 cells/uL Final   Basophils Absolute 05/08/2023 22  0 - 200 cells/uL Final   Neutrophils Relative % 05/08/2023 51.9  % Final   Total Lymphocyte 05/08/2023 35.0  % Final   Monocytes Relative 05/08/2023 9.2  % Final   Eosinophils Relative 05/08/2023 3.5  % Final   Basophils  Relative 05/08/2023 0.4  % Final   Glucose, Bld 05/08/2023 138  65 - 139 mg/dL Final   BUN 88/95/7975 17  7 - 25 mg/dL Final   Creat 88/95/7975 0.97  0.70 - 1.28 mg/dL Final   eGFR 88/95/7975 79  > OR = 60 mL/min/1.53m2 Final   BUN/Creatinine Ratio 05/08/2023 SEE NOTE:  6 - 22 (calc) Final   Sodium 05/08/2023 140  135 - 146 mmol/L Final   Potassium 05/08/2023 4.7  3.5 - 5.3 mmol/L Final   Chloride 05/08/2023 102  98 - 110 mmol/L Final   CO2 05/08/2023 31  20 - 32 mmol/L Final   Calcium  05/08/2023 9.7  8.6 - 10.3 mg/dL Final   Total Protein 88/95/7975 7.0  6.1 - 8.1 g/dL Final   Albumin 88/95/7975 4.3  3.6 - 5.1 g/dL Final   Globulin 88/95/7975 2.7  1.9 - 3.7 g/dL (calc) Final   AG Ratio 05/08/2023 1.6  1.0 - 2.5 (calc) Final   Total Bilirubin 05/08/2023 0.6  0.2 - 1.2 mg/dL Final   Alkaline phosphatase (APISO) 05/08/2023 62  35 - 144 U/L Final   AST 05/08/2023 15  10 - 35 U/L Final   ALT 05/08/2023 11  9 - 46 U/L Final   Magnesium 05/08/2023 1.9  1.5 - 2.5 mg/dL Final   Cholesterol 88/95/7975 125  <200 mg/dL Final   HDL 88/95/7975 48  > OR = 40 mg/dL Final   Triglycerides 88/95/7975 163 (H)  <150 mg/dL Final   LDL Cholesterol (Calc) 05/08/2023 53  mg/dL (calc) Final   Total CHOL/HDL Ratio 05/08/2023 2.6  <4.9 (calc) Final   Non-HDL Cholesterol (Calc) 05/08/2023 77  <130 mg/dL (calc) Final   TSH 88/95/7975 2.12  0.40 - 4.50 mIU/L Final   Hgb A1c MFr Bld 05/08/2023 6.4 (H)  <5.7 % of total Hgb Final   Mean Plasma Glucose 05/08/2023 137  mg/dL Final   eAG (mmol/L) 88/95/7975 7.6  mmol/L Final   Insulin  05/08/2023 49.3 (H)  uIU/mL Final   Vit D, 25-Hydroxy 05/08/2023 76  30 - 100 ng/mL Final  Office Visit on 04/10/2023  Component Date Value Ref Range Status   Glucose, Bld 04/10/2023 99  65 - 99 mg/dL Final   BUN 89/92/7975 20  7 - 25 mg/dL Final   Creat 89/92/7975 0.94  0.70 - 1.28 mg/dL Final   eGFR 89/92/7975 82  > OR = 60 mL/min/1.66m2 Final   BUN/Creatinine Ratio 04/10/2023 SEE  NOTE:  6 - 22 (calc) Final   Sodium 04/10/2023 140  135 - 146 mmol/L Final   Potassium 04/10/2023 4.7  3.5 - 5.3 mmol/L Final   Chloride 04/10/2023 104  98 - 110 mmol/L Final   CO2 04/10/2023 25  20 - 32  mmol/L Final   Calcium  04/10/2023 9.7  8.6 - 10.3 mg/dL Final  There may be more visits with results that are not included.  No image results found. CT Head Wo Contrast Result Date: 03/13/2024 CLINICAL DATA:  Altered level of consciousness EXAM: CT HEAD WITHOUT CONTRAST TECHNIQUE: Contiguous axial images were obtained from the base of the skull through the vertex without intravenous contrast. RADIATION DOSE REDUCTION: This exam was performed according to the departmental dose-optimization program which includes automated exposure control, adjustment of the mA and/or kV according to patient size and/or use of iterative reconstruction technique. COMPARISON:  02/28/2024, 03/16/2023 FINDINGS: Brain: Chronic small vessel ischemic changes are again seen throughout the periventricular white matter. No evidence of acute infarct or hemorrhage. Lateral ventricles and midline structures appear unremarkable. No acute extra-axial fluid collections. No mass effect. Vascular: Stable atherosclerosis of the internal carotid arteries. No hyperdense vessel. Skull: Normal. Negative for fracture or focal lesion. Sinuses/Orbits: No acute finding. Other: None. IMPRESSION: 1. No acute intracranial process. Chronic small vessel ischemic changes as above. Electronically Signed   By: Ozell Daring M.D.   On: 03/13/2024 17:16         ASSESSMENT & PLAN   Assessment & Plan Primary hypertension Blood pressure is well-controlled at 120/66 mmHg with the current medication regimen. Continue the current antihypertensive regimen. 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 Symptoms include eye movement issues and dizziness, possibly linked to neck movement and inner ear changes. Physical therapy exercises have been beneficial. Continue these exercises and follow up with a neuro-ophthalmologist for further evaluation and management. Maintain a diary of symptoms and triggers for review. Other constipation Updated problem overview for this problem to improve longitudinal management  Type 2 diabetes mellitus without complication, without long-term current use of insulin  (HCC) Currently managed with semaglutide  (Ozempic ) and metformin . Discussed increasing the semaglutide  dose to 0.5 mg weekly to improve glycemic control and reduce cardiovascular risk, considering potential gastrointestinal side effects and rare vision-threatening effects. Benefits include weight loss, liver protection, and cardiovascular protection. Emphasized shared decision-making. Monitor for side effects and adjust treatment as necessary. Consider discontinuing metformin  if the increased semaglutide  dose is tolerated. 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 Symptoms include dizziness upon standing and imbalance, potentially worsened by neck movement and inner ear changes. Continue physical therapy for balance training. Monitor symptoms and report any changes. Cervical myelopathy (HCC)  Severe cervical spinal stenosis   This condition contributes to neck stiffness and potential dizziness. Previous evaluations suggest surgical intervention is not advised. Continue current management and monitor symptoms.  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 Primary hypertension  I10 atenolol  (TENORMIN ) 50 MG tablet     3. Nystagmus of right eye  H55.00     4. Other constipation  K59.09     5. Type 2 diabetes mellitus without complication, without long-term current use of insulin  (HCC)  E11.9 Semaglutide ,0.25 or 0.5MG /DOS, (OZEMPIC , 0.25 OR 0.5 MG/DOSE,) 2 MG/3ML SOPN    6. Dizziness - Chronic, Persistent, Idiopathic, associated with severe falls and imbalance  R42  7. Cervical myelopathy (HCC)  G95.9      Meds ordered this encounter  Medications   atenolol  (TENORMIN ) 50 MG tablet    Sig: Take 1 tablet (50 mg total) by mouth 2 (two) times daily.    Dispense:  90 tablet    Refill:  4   Semaglutide ,0.25 or 0.5MG /DOS, (OZEMPIC , 0.25 OR 0.5 MG/DOSE,) 2 MG/3ML SOPN    Sig: Inject 0.5 mLs into the skin once a week.    Dispense:  3 mL    Refill:  5        This document was synthesized by artificial intelligence ([employer]) using HIPAA-compliant recording of the clinical interaction;   We discussed the use of AI scribe software for clinical note transcription with the patient, who gave verbal consent to proceed. additional Info: This encounter employed state-of-the-art, real-time, collaborative documentation. The patient actively reviewed and assisted in updating their electronic medical record on a shared screen, ensuring transparency and facilitating joint problem-solving for the problem list, overview, and plan. This approach promotes accurate, informed care. The treatment plan was discussed and reviewed in detail, including medication safety, potential side effects, and all patient questions. We confirmed understanding and comfort with the plan. Follow-up instructions were established, including contacting the office for any concerns, returning if symptoms worsen, persist, or new symptoms develop, and precautions for potential emergency department visits.

## 2024-05-30 DIAGNOSIS — K5909 Other constipation: Secondary | ICD-10-CM | POA: Insufficient documentation

## 2024-05-30 NOTE — Assessment & Plan Note (Signed)
  Severe cervical spinal stenosis   This condition contributes to neck stiffness and potential dizziness. Previous evaluations suggest surgical intervention is not advised. Continue current management and monitor symptoms.

## 2024-05-30 NOTE — Assessment & Plan Note (Signed)
 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 Symptoms include dizziness upon standing and imbalance, potentially worsened by neck movement and inner ear changes. Continue physical therapy for balance training. Monitor symptoms and report any changes.

## 2024-05-30 NOTE — Assessment & Plan Note (Addendum)
 Blood pressure is well-controlled at 120/66 mmHg with the current medication regimen. Continue the current antihypertensive regimen.

## 2024-05-30 NOTE — Assessment & Plan Note (Signed)
 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 Symptoms include eye movement issues and dizziness, possibly linked to neck movement and inner ear changes. Physical therapy exercises have been beneficial. Continue these exercises and follow up with a neuro-ophthalmologist for further evaluation and management. Maintain a diary of symptoms and triggers for review.

## 2024-05-30 NOTE — Assessment & Plan Note (Signed)
Updated problem overview for this problem to improve longitudinal management  

## 2024-06-13 DIAGNOSIS — L814 Other melanin hyperpigmentation: Secondary | ICD-10-CM | POA: Diagnosis not present

## 2024-06-13 DIAGNOSIS — Z789 Other specified health status: Secondary | ICD-10-CM | POA: Diagnosis not present

## 2024-06-13 DIAGNOSIS — L538 Other specified erythematous conditions: Secondary | ICD-10-CM | POA: Diagnosis not present

## 2024-06-13 DIAGNOSIS — L821 Other seborrheic keratosis: Secondary | ICD-10-CM | POA: Diagnosis not present

## 2024-06-13 DIAGNOSIS — L570 Actinic keratosis: Secondary | ICD-10-CM | POA: Diagnosis not present

## 2024-06-13 DIAGNOSIS — L820 Inflamed seborrheic keratosis: Secondary | ICD-10-CM | POA: Diagnosis not present

## 2024-06-13 DIAGNOSIS — L2989 Other pruritus: Secondary | ICD-10-CM | POA: Diagnosis not present

## 2024-06-13 DIAGNOSIS — D1801 Hemangioma of skin and subcutaneous tissue: Secondary | ICD-10-CM | POA: Diagnosis not present

## 2024-06-17 ENCOUNTER — Telehealth: Payer: Self-pay | Admitting: Neurology

## 2024-06-17 ENCOUNTER — Encounter: Payer: Self-pay | Admitting: Internal Medicine

## 2024-06-17 NOTE — Telephone Encounter (Signed)
 Patient's wife called to verify if have an appointment scheduled. Informed her patient was seen earlier and appointment was cancelled. She verbalized understand.

## 2024-06-20 ENCOUNTER — Institutional Professional Consult (permissible substitution): Admitting: Neurology

## 2024-07-09 ENCOUNTER — Telehealth: Payer: Self-pay

## 2024-07-09 ENCOUNTER — Other Ambulatory Visit: Payer: Self-pay

## 2024-07-09 MED ORDER — FAMOTIDINE 20 MG PO TABS: 20.0000 mg | ORAL_TABLET | ORAL | 2 refills | Status: AC | PRN

## 2024-07-09 NOTE — Telephone Encounter (Signed)
 Copied from CRM [id number]. Topic: Clinical - Medication Question >> Jul 09, 2024  2:46 PM Joshua Higgins wrote: Reason for CRM: Pt wife confirmed that pt does need famotidine  (PEPCID ) 20 MG tablet.  Sent medication to pharmacy

## 2024-07-09 NOTE — Telephone Encounter (Signed)
 Tried to call pt to make sure they needed famotidine  20 mg tablet sent to pharmacy? Left message for pt to call office back

## 2024-07-17 ENCOUNTER — Other Ambulatory Visit: Payer: Self-pay | Admitting: Internal Medicine

## 2024-07-17 DIAGNOSIS — I6521 Occlusion and stenosis of right carotid artery: Secondary | ICD-10-CM

## 2024-08-07 ENCOUNTER — Telehealth: Payer: Self-pay

## 2024-08-07 DIAGNOSIS — E1122 Type 2 diabetes mellitus with diabetic chronic kidney disease: Secondary | ICD-10-CM

## 2024-08-07 DIAGNOSIS — E559 Vitamin D deficiency, unspecified: Secondary | ICD-10-CM

## 2024-08-07 DIAGNOSIS — E538 Deficiency of other specified B group vitamins: Secondary | ICD-10-CM

## 2024-08-07 NOTE — Addendum Note (Signed)
 Addended by: Lenus Trauger G on: 08/07/2024 12:52 PM   Modules accepted: Orders

## 2024-08-07 NOTE — Telephone Encounter (Signed)
 Please advise on completing labs before appointment to discuss during appointment.   Copied from CRM [id number]. Topic: Clinical - Request for Lab/Test Order >> Aug 07, 2024 11:13 AM Terri MATSU wrote: Reason for CRM: Heron (patient wife) calling requesting bloodwork to check his A1C so they can talk about his numbers on his appt for 2/26 Requesting call after order has been placed so they can schedule lab visit.

## 2024-08-14 ENCOUNTER — Other Ambulatory Visit

## 2024-08-29 ENCOUNTER — Ambulatory Visit: Admitting: Internal Medicine

## 2025-05-13 ENCOUNTER — Ambulatory Visit
